# Patient Record
Sex: Female | Born: 1955 | Race: Black or African American | Hispanic: No | Smoking: Former smoker
Health system: Southern US, Community
[De-identification: ages and names within clinical notes are randomized; demographics above are authoritative.]

## PROBLEM LIST (undated history)

## (undated) DIAGNOSIS — M349 Systemic sclerosis, unspecified: Secondary | ICD-10-CM

## (undated) DIAGNOSIS — J479 Bronchiectasis, uncomplicated: Secondary | ICD-10-CM

## (undated) DIAGNOSIS — M79606 Pain in leg, unspecified: Secondary | ICD-10-CM

## (undated) DIAGNOSIS — M87022 Idiopathic aseptic necrosis of left humerus: Secondary | ICD-10-CM

## (undated) DIAGNOSIS — M069 Rheumatoid arthritis, unspecified: Secondary | ICD-10-CM

## (undated) DIAGNOSIS — I219 Acute myocardial infarction, unspecified: Secondary | ICD-10-CM

## (undated) DIAGNOSIS — I2510 Atherosclerotic heart disease of native coronary artery without angina pectoris: Secondary | ICD-10-CM

## (undated) DIAGNOSIS — I10 Essential (primary) hypertension: Secondary | ICD-10-CM

## (undated) DIAGNOSIS — K219 Gastro-esophageal reflux disease without esophagitis: Secondary | ICD-10-CM

## (undated) DIAGNOSIS — IMO0001 Reserved for inherently not codable concepts without codable children: Secondary | ICD-10-CM

## (undated) DIAGNOSIS — E7800 Pure hypercholesterolemia, unspecified: Secondary | ICD-10-CM

## (undated) HISTORY — DX: Pain in leg, unspecified: M79.606

## (undated) HISTORY — PX: APPENDECTOMY: SHX54

---

## 1997-12-27 ENCOUNTER — Encounter: Admission: RE | Admit: 1997-12-27 | Discharge: 1997-12-27 | Payer: Self-pay | Admitting: Family Medicine

## 1998-01-25 ENCOUNTER — Encounter: Admission: RE | Admit: 1998-01-25 | Discharge: 1998-01-25 | Payer: Self-pay | Admitting: Family Medicine

## 1998-04-03 ENCOUNTER — Encounter: Admission: RE | Admit: 1998-04-03 | Discharge: 1998-04-03 | Payer: Self-pay | Admitting: Family Medicine

## 1998-05-06 ENCOUNTER — Encounter: Admission: RE | Admit: 1998-05-06 | Discharge: 1998-05-06 | Payer: Self-pay | Admitting: Family Medicine

## 1998-06-04 ENCOUNTER — Encounter: Admission: RE | Admit: 1998-06-04 | Discharge: 1998-06-04 | Payer: Self-pay | Admitting: Family Medicine

## 1998-09-04 ENCOUNTER — Other Ambulatory Visit: Admission: RE | Admit: 1998-09-04 | Discharge: 1998-09-04 | Payer: Self-pay | Admitting: Family Medicine

## 1998-09-04 ENCOUNTER — Encounter: Admission: RE | Admit: 1998-09-04 | Discharge: 1998-09-04 | Payer: Self-pay | Admitting: Family Medicine

## 1998-11-13 ENCOUNTER — Emergency Department (HOSPITAL_COMMUNITY): Admission: EM | Admit: 1998-11-13 | Discharge: 1998-11-13 | Payer: Self-pay | Admitting: *Deleted

## 1999-01-15 ENCOUNTER — Encounter: Admission: RE | Admit: 1999-01-15 | Discharge: 1999-01-15 | Payer: Self-pay | Admitting: Family Medicine

## 1999-01-21 ENCOUNTER — Ambulatory Visit (HOSPITAL_COMMUNITY): Admission: RE | Admit: 1999-01-21 | Discharge: 1999-01-21 | Payer: Self-pay | Admitting: *Deleted

## 1999-02-13 ENCOUNTER — Encounter: Admission: RE | Admit: 1999-02-13 | Discharge: 1999-02-13 | Payer: Self-pay | Admitting: Family Medicine

## 1999-02-28 ENCOUNTER — Emergency Department (HOSPITAL_COMMUNITY): Admission: EM | Admit: 1999-02-28 | Discharge: 1999-02-28 | Payer: Self-pay | Admitting: Emergency Medicine

## 1999-03-01 ENCOUNTER — Inpatient Hospital Stay (HOSPITAL_COMMUNITY): Admission: EM | Admit: 1999-03-01 | Discharge: 1999-03-06 | Payer: Self-pay | Admitting: Emergency Medicine

## 1999-03-01 ENCOUNTER — Encounter: Payer: Self-pay | Admitting: General Surgery

## 1999-03-01 ENCOUNTER — Encounter (INDEPENDENT_AMBULATORY_CARE_PROVIDER_SITE_OTHER): Payer: Self-pay | Admitting: Specialist

## 1999-03-01 ENCOUNTER — Encounter: Payer: Self-pay | Admitting: Emergency Medicine

## 1999-04-25 ENCOUNTER — Encounter: Admission: RE | Admit: 1999-04-25 | Discharge: 1999-04-25 | Payer: Self-pay | Admitting: Family Medicine

## 1999-05-08 ENCOUNTER — Encounter: Admission: RE | Admit: 1999-05-08 | Discharge: 1999-05-08 | Payer: Self-pay | Admitting: Family Medicine

## 1999-05-29 ENCOUNTER — Encounter: Admission: RE | Admit: 1999-05-29 | Discharge: 1999-05-29 | Payer: Self-pay | Admitting: Family Medicine

## 1999-09-19 ENCOUNTER — Encounter: Admission: RE | Admit: 1999-09-19 | Discharge: 1999-09-19 | Payer: Self-pay | Admitting: Family Medicine

## 1999-12-29 ENCOUNTER — Encounter: Admission: RE | Admit: 1999-12-29 | Discharge: 1999-12-29 | Payer: Self-pay | Admitting: Family Medicine

## 2000-01-02 ENCOUNTER — Ambulatory Visit (HOSPITAL_COMMUNITY): Admission: RE | Admit: 2000-01-02 | Discharge: 2000-01-02 | Payer: Self-pay | Admitting: *Deleted

## 2000-03-23 ENCOUNTER — Encounter: Admission: RE | Admit: 2000-03-23 | Discharge: 2000-03-23 | Payer: Self-pay | Admitting: *Deleted

## 2000-04-26 ENCOUNTER — Encounter: Admission: RE | Admit: 2000-04-26 | Discharge: 2000-04-26 | Payer: Self-pay | Admitting: Family Medicine

## 2000-06-04 ENCOUNTER — Ambulatory Visit (HOSPITAL_COMMUNITY): Admission: RE | Admit: 2000-06-04 | Discharge: 2000-06-04 | Payer: Self-pay | Admitting: Gastroenterology

## 2000-06-04 ENCOUNTER — Encounter: Payer: Self-pay | Admitting: Gastroenterology

## 2000-07-28 ENCOUNTER — Encounter: Admission: RE | Admit: 2000-07-28 | Discharge: 2000-07-28 | Payer: Self-pay | Admitting: Family Medicine

## 2000-07-28 ENCOUNTER — Other Ambulatory Visit: Admission: RE | Admit: 2000-07-28 | Discharge: 2000-07-28 | Payer: Self-pay | Admitting: *Deleted

## 2001-03-02 ENCOUNTER — Encounter: Admission: RE | Admit: 2001-03-02 | Discharge: 2001-03-02 | Payer: Self-pay | Admitting: Family Medicine

## 2001-03-25 ENCOUNTER — Encounter: Admission: RE | Admit: 2001-03-25 | Discharge: 2001-03-25 | Payer: Self-pay | Admitting: Family Medicine

## 2001-05-10 ENCOUNTER — Encounter: Admission: RE | Admit: 2001-05-10 | Discharge: 2001-05-10 | Payer: Self-pay | Admitting: Family Medicine

## 2001-07-28 ENCOUNTER — Encounter: Admission: RE | Admit: 2001-07-28 | Discharge: 2001-07-28 | Payer: Self-pay | Admitting: Family Medicine

## 2001-08-12 ENCOUNTER — Encounter: Admission: RE | Admit: 2001-08-12 | Discharge: 2001-08-12 | Payer: Self-pay | Admitting: Family Medicine

## 2001-09-01 ENCOUNTER — Encounter: Admission: RE | Admit: 2001-09-01 | Discharge: 2001-09-01 | Payer: Self-pay | Admitting: Family Medicine

## 2001-09-01 ENCOUNTER — Other Ambulatory Visit: Admission: RE | Admit: 2001-09-01 | Discharge: 2001-09-01 | Payer: Self-pay | Admitting: Family Medicine

## 2001-09-09 ENCOUNTER — Ambulatory Visit (HOSPITAL_COMMUNITY): Admission: RE | Admit: 2001-09-09 | Discharge: 2001-09-09 | Payer: Self-pay | Admitting: Family Medicine

## 2001-10-18 ENCOUNTER — Encounter: Admission: RE | Admit: 2001-10-18 | Discharge: 2001-10-18 | Payer: Self-pay | Admitting: Family Medicine

## 2001-10-21 ENCOUNTER — Encounter: Admission: RE | Admit: 2001-10-21 | Discharge: 2001-10-21 | Payer: Self-pay | Admitting: Family Medicine

## 2001-11-09 ENCOUNTER — Encounter: Admission: RE | Admit: 2001-11-09 | Discharge: 2001-11-09 | Payer: Self-pay | Admitting: Family Medicine

## 2001-12-02 ENCOUNTER — Encounter: Admission: RE | Admit: 2001-12-02 | Discharge: 2001-12-02 | Payer: Self-pay | Admitting: Family Medicine

## 2002-02-08 ENCOUNTER — Ambulatory Visit (HOSPITAL_COMMUNITY): Admission: RE | Admit: 2002-02-08 | Discharge: 2002-02-08 | Payer: Self-pay | Admitting: Family Medicine

## 2002-02-08 ENCOUNTER — Encounter: Admission: RE | Admit: 2002-02-08 | Discharge: 2002-02-08 | Payer: Self-pay | Admitting: Family Medicine

## 2002-02-24 ENCOUNTER — Encounter: Admission: RE | Admit: 2002-02-24 | Discharge: 2002-02-24 | Payer: Self-pay | Admitting: Family Medicine

## 2002-04-03 ENCOUNTER — Encounter: Admission: RE | Admit: 2002-04-03 | Discharge: 2002-04-03 | Payer: Self-pay | Admitting: Family Medicine

## 2002-04-07 ENCOUNTER — Encounter: Admission: RE | Admit: 2002-04-07 | Discharge: 2002-04-07 | Payer: Self-pay | Admitting: Family Medicine

## 2002-07-31 ENCOUNTER — Encounter: Admission: RE | Admit: 2002-07-31 | Discharge: 2002-07-31 | Payer: Self-pay | Admitting: Family Medicine

## 2002-08-08 ENCOUNTER — Encounter: Admission: RE | Admit: 2002-08-08 | Discharge: 2002-08-08 | Payer: Self-pay | Admitting: Sports Medicine

## 2002-08-08 ENCOUNTER — Ambulatory Visit (HOSPITAL_COMMUNITY): Admission: RE | Admit: 2002-08-08 | Discharge: 2002-08-08 | Payer: Self-pay | Admitting: Family Medicine

## 2002-08-15 ENCOUNTER — Encounter: Admission: RE | Admit: 2002-08-15 | Discharge: 2002-08-15 | Payer: Self-pay | Admitting: Sports Medicine

## 2002-09-18 ENCOUNTER — Encounter: Admission: RE | Admit: 2002-09-18 | Discharge: 2002-09-18 | Payer: Self-pay | Admitting: Pediatrics

## 2002-09-18 ENCOUNTER — Other Ambulatory Visit: Admission: RE | Admit: 2002-09-18 | Discharge: 2002-09-18 | Payer: Self-pay | Admitting: Family Medicine

## 2002-09-26 ENCOUNTER — Encounter: Payer: Self-pay | Admitting: Sports Medicine

## 2002-09-26 ENCOUNTER — Encounter: Admission: RE | Admit: 2002-09-26 | Discharge: 2002-09-26 | Payer: Self-pay | Admitting: Sports Medicine

## 2002-12-12 ENCOUNTER — Ambulatory Visit (HOSPITAL_COMMUNITY): Admission: RE | Admit: 2002-12-12 | Discharge: 2002-12-12 | Payer: Self-pay | Admitting: Family Medicine

## 2003-01-15 ENCOUNTER — Encounter: Admission: RE | Admit: 2003-01-15 | Discharge: 2003-01-15 | Payer: Self-pay | Admitting: Family Medicine

## 2003-05-11 ENCOUNTER — Encounter: Admission: RE | Admit: 2003-05-11 | Discharge: 2003-05-11 | Payer: Self-pay | Admitting: Family Medicine

## 2003-05-11 ENCOUNTER — Encounter: Payer: Self-pay | Admitting: Family Medicine

## 2003-05-11 LAB — CONVERTED CEMR LAB
HDL: 44 mg/dL
Triglycerides: 118 mg/dL

## 2003-11-28 ENCOUNTER — Encounter: Admission: RE | Admit: 2003-11-28 | Discharge: 2003-11-28 | Payer: Self-pay | Admitting: Sports Medicine

## 2003-12-05 ENCOUNTER — Encounter: Admission: RE | Admit: 2003-12-05 | Discharge: 2003-12-05 | Payer: Self-pay | Admitting: Family Medicine

## 2004-06-26 ENCOUNTER — Ambulatory Visit: Payer: Self-pay | Admitting: Family Medicine

## 2004-10-25 ENCOUNTER — Emergency Department (HOSPITAL_COMMUNITY): Admission: EM | Admit: 2004-10-25 | Discharge: 2004-10-25 | Payer: Self-pay | Admitting: Emergency Medicine

## 2005-01-13 ENCOUNTER — Ambulatory Visit: Payer: Self-pay | Admitting: Family Medicine

## 2005-01-13 ENCOUNTER — Encounter: Payer: Self-pay | Admitting: Family Medicine

## 2005-01-13 LAB — CONVERTED CEMR LAB
Cholesterol: 154 mg/dL
LDL Cholesterol: 93 mg/dL
Triglycerides: 87 mg/dL
VLDL: 17 mg/dL

## 2005-02-11 ENCOUNTER — Encounter: Admission: RE | Admit: 2005-02-11 | Discharge: 2005-02-11 | Payer: Self-pay | Admitting: Sports Medicine

## 2006-02-16 ENCOUNTER — Encounter: Admission: RE | Admit: 2006-02-16 | Discharge: 2006-02-16 | Payer: Self-pay | Admitting: Sports Medicine

## 2006-02-24 ENCOUNTER — Encounter (INDEPENDENT_AMBULATORY_CARE_PROVIDER_SITE_OTHER): Payer: Self-pay | Admitting: *Deleted

## 2006-02-24 LAB — CONVERTED CEMR LAB

## 2006-03-08 ENCOUNTER — Ambulatory Visit: Payer: Self-pay | Admitting: Sports Medicine

## 2006-03-08 LAB — CONVERTED CEMR LAB
AST: 24 units/L
BUN: 11 mg/dL
CO2: 27 meq/L
Chloride: 104 meq/L
Glucose, Bld: 98 mg/dL
Potassium: 4 meq/L
TSH: 2.336 microintl units/mL

## 2006-09-23 DIAGNOSIS — M1990 Unspecified osteoarthritis, unspecified site: Secondary | ICD-10-CM

## 2006-09-23 DIAGNOSIS — F29 Unspecified psychosis not due to a substance or known physiological condition: Secondary | ICD-10-CM | POA: Insufficient documentation

## 2006-09-23 DIAGNOSIS — I7300 Raynaud's syndrome without gangrene: Secondary | ICD-10-CM | POA: Insufficient documentation

## 2006-09-23 DIAGNOSIS — I10 Essential (primary) hypertension: Secondary | ICD-10-CM | POA: Insufficient documentation

## 2006-09-23 DIAGNOSIS — F339 Major depressive disorder, recurrent, unspecified: Secondary | ICD-10-CM

## 2006-09-23 DIAGNOSIS — I739 Peripheral vascular disease, unspecified: Secondary | ICD-10-CM

## 2006-09-23 DIAGNOSIS — F17200 Nicotine dependence, unspecified, uncomplicated: Secondary | ICD-10-CM

## 2006-09-23 HISTORY — DX: Raynaud's syndrome without gangrene: I73.00

## 2006-09-24 ENCOUNTER — Encounter (INDEPENDENT_AMBULATORY_CARE_PROVIDER_SITE_OTHER): Payer: Self-pay | Admitting: *Deleted

## 2006-09-29 ENCOUNTER — Ambulatory Visit: Payer: Self-pay | Admitting: Family Medicine

## 2006-10-08 ENCOUNTER — Ambulatory Visit (HOSPITAL_COMMUNITY): Admission: RE | Admit: 2006-10-08 | Discharge: 2006-10-08 | Payer: Self-pay | Admitting: Family Medicine

## 2007-03-15 ENCOUNTER — Emergency Department (HOSPITAL_COMMUNITY): Admission: EM | Admit: 2007-03-15 | Discharge: 2007-03-15 | Payer: Self-pay | Admitting: Emergency Medicine

## 2007-03-17 ENCOUNTER — Telehealth (INDEPENDENT_AMBULATORY_CARE_PROVIDER_SITE_OTHER): Payer: Self-pay | Admitting: *Deleted

## 2007-03-18 ENCOUNTER — Ambulatory Visit: Payer: Self-pay | Admitting: Family Medicine

## 2007-03-18 DIAGNOSIS — L84 Corns and callosities: Secondary | ICD-10-CM | POA: Insufficient documentation

## 2007-03-29 ENCOUNTER — Telehealth: Payer: Self-pay | Admitting: *Deleted

## 2007-03-31 ENCOUNTER — Encounter: Admission: RE | Admit: 2007-03-31 | Discharge: 2007-03-31 | Payer: Self-pay | Admitting: Sports Medicine

## 2007-05-05 ENCOUNTER — Encounter: Payer: Self-pay | Admitting: *Deleted

## 2007-05-08 ENCOUNTER — Encounter: Payer: Self-pay | Admitting: Family Medicine

## 2007-05-16 ENCOUNTER — Encounter: Payer: Self-pay | Admitting: Family Medicine

## 2007-05-19 ENCOUNTER — Ambulatory Visit: Payer: Self-pay | Admitting: Family Medicine

## 2007-06-02 ENCOUNTER — Encounter: Payer: Self-pay | Admitting: Family Medicine

## 2007-06-22 ENCOUNTER — Telehealth: Payer: Self-pay | Admitting: Family Medicine

## 2007-06-28 ENCOUNTER — Ambulatory Visit: Payer: Self-pay | Admitting: Family Medicine

## 2007-07-01 ENCOUNTER — Encounter: Payer: Self-pay | Admitting: Family Medicine

## 2007-07-07 ENCOUNTER — Encounter: Payer: Self-pay | Admitting: Family Medicine

## 2007-07-07 ENCOUNTER — Ambulatory Visit: Payer: Self-pay | Admitting: Family Medicine

## 2007-07-07 LAB — CONVERTED CEMR LAB
BUN: 15 mg/dL (ref 6–23)
Calcium: 8.9 mg/dL (ref 8.4–10.5)
Chloride: 106 meq/L (ref 96–112)
Cholesterol: 163 mg/dL (ref 0–200)
Creatinine, Ser: 0.99 mg/dL (ref 0.40–1.20)
HDL: 43 mg/dL (ref >39)
LDL Cholesterol: 90 mg/dL (ref 0–99)
Potassium: 3.9 meq/L (ref 3.5–5.3)

## 2007-07-17 ENCOUNTER — Encounter: Payer: Self-pay | Admitting: Family Medicine

## 2007-08-02 ENCOUNTER — Encounter: Payer: Self-pay | Admitting: Family Medicine

## 2007-08-02 LAB — CONVERTED CEMR LAB
ALT: 30 units/L
HCT: 41.4 %
Indirect Bilirubin: 0.3 mg/dL
Total Bilirubin: 0.5 mg/dL
Total Protein: 9 g/dL

## 2007-09-05 ENCOUNTER — Encounter: Payer: Self-pay | Admitting: Family Medicine

## 2007-09-19 ENCOUNTER — Telehealth: Payer: Self-pay | Admitting: Family Medicine

## 2007-10-07 ENCOUNTER — Telehealth: Payer: Self-pay | Admitting: *Deleted

## 2007-10-10 ENCOUNTER — Ambulatory Visit: Payer: Self-pay | Admitting: Family Medicine

## 2008-02-20 ENCOUNTER — Telehealth: Payer: Self-pay | Admitting: *Deleted

## 2008-03-16 ENCOUNTER — Encounter: Payer: Self-pay | Admitting: *Deleted

## 2008-04-06 ENCOUNTER — Telehealth: Payer: Self-pay | Admitting: *Deleted

## 2008-04-10 ENCOUNTER — Telehealth: Payer: Self-pay | Admitting: *Deleted

## 2008-05-02 ENCOUNTER — Encounter: Admission: RE | Admit: 2008-05-02 | Discharge: 2008-05-02 | Payer: Self-pay | Admitting: Family Medicine

## 2008-05-06 ENCOUNTER — Encounter (INDEPENDENT_AMBULATORY_CARE_PROVIDER_SITE_OTHER): Payer: Self-pay | Admitting: *Deleted

## 2008-07-16 ENCOUNTER — Telehealth: Payer: Self-pay | Admitting: Family Medicine

## 2008-08-02 ENCOUNTER — Ambulatory Visit: Payer: Self-pay | Admitting: Family Medicine

## 2008-08-02 ENCOUNTER — Encounter (INDEPENDENT_AMBULATORY_CARE_PROVIDER_SITE_OTHER): Payer: Self-pay | Admitting: *Deleted

## 2008-08-02 DIAGNOSIS — K219 Gastro-esophageal reflux disease without esophagitis: Secondary | ICD-10-CM

## 2008-08-07 ENCOUNTER — Encounter: Payer: Self-pay | Admitting: Family Medicine

## 2009-01-10 ENCOUNTER — Encounter (INDEPENDENT_AMBULATORY_CARE_PROVIDER_SITE_OTHER): Payer: Self-pay | Admitting: *Deleted

## 2009-01-29 ENCOUNTER — Ambulatory Visit: Payer: Self-pay | Admitting: Family Medicine

## 2009-01-29 ENCOUNTER — Encounter: Payer: Self-pay | Admitting: Family Medicine

## 2009-05-22 ENCOUNTER — Telehealth: Payer: Self-pay | Admitting: *Deleted

## 2009-05-22 ENCOUNTER — Encounter: Admission: RE | Admit: 2009-05-22 | Discharge: 2009-05-22 | Payer: Self-pay | Admitting: Family Medicine

## 2009-05-23 ENCOUNTER — Telehealth: Payer: Self-pay | Admitting: Family Medicine

## 2009-08-28 ENCOUNTER — Other Ambulatory Visit: Admission: RE | Admit: 2009-08-28 | Discharge: 2009-08-28 | Payer: Self-pay | Admitting: Family Medicine

## 2009-08-28 ENCOUNTER — Ambulatory Visit: Payer: Self-pay | Admitting: Family Medicine

## 2009-09-02 LAB — CONVERTED CEMR LAB: Pap Smear: NEGATIVE

## 2009-10-31 ENCOUNTER — Ambulatory Visit: Payer: Self-pay | Admitting: Family Medicine

## 2009-10-31 ENCOUNTER — Telehealth: Payer: Self-pay | Admitting: *Deleted

## 2009-11-06 ENCOUNTER — Encounter: Admission: RE | Admit: 2009-11-06 | Discharge: 2009-11-06 | Payer: Self-pay | Admitting: Family Medicine

## 2010-08-26 NOTE — Assessment & Plan Note (Signed)
Summary: allergies/eo   Vital Signs:  Patient profile:   55 year old female Height:      66.25 inches Weight:      149.5 pounds Temp:     98.5 degrees F oral Pulse rate:   98 / minute BP sitting:   147 / 95  (right arm) Cuff size:   regular  Vitals Entered By: San Morelle, SMA CC: Pt c/o cough for 2 months. She also stated she has a runny nose, sneezing, stomach hurts from coughing to much, gagging, loss bowels, vomitting, and trouble sleeping.    Primary Care Provider:  Eustaquio Boyden  MD  CC:  Pt c/o cough for 2 months. She also stated she has a runny nose, sneezing, stomach hurts from coughing to much, gagging, loss bowels, vomitting, and and trouble sleeping. Marland Kitchen  History of Present Illness: CC: continued cough  almost 3 mo h/o cough, scratchy throat with cough, feels like throat stretching.  Having gagging with cough.  only lots of flegm coming up (yellow), no emesis.  No fevers/chills.  Takes black cohosh for hot flashes.  + rhinorrhea and itchy eyes.  + sinus draining/congestion.  + post nasal drip.  Cough worse after eating - happens with all foods.  Does have h/o GERD, only on ranitidine.  + about 10 lb weight loss in last 6 mo (160 to 149), not trying.  No dysphagia with solids or liquids.  h/o scleroderma.  Also having loose bowels and trouble sleeping from coughing so much.  Currently on Lisinopril as well as other meds and compliant.  Habits & Providers  Alcohol-Tobacco-Diet     Tobacco Status: current     Tobacco Counseling: to quit use of tobacco products     Cigarette Packs/Day: 0.5  Current Medications (verified): 1)  Lisinopril 10 Mg Tabs (Lisinopril) .... Take One By Mouth Daily 2)  Atrovent Hfa 17 Mcg/act Aers (Ipratropium Bromide Hfa) .... Inhale 2 Puff Using Inhaler Four Times A Day 3)  Hydrochlorothiazide 25 Mg Tabs (Hydrochlorothiazide) .... Take 1 Tablet By Mouth Once A Day 4)  Verapamil Hcl 80 Mg Tabs (Verapamil Hcl) .... Take 1 Tablet By Mouth  Three Times A Day 5)  Zyprexa 5 Mg Tabs (Olanzapine) .... Take 1 Tablet By Mouth Once A Day 6)  Acid Control Maximum Strength 150 Mg  Tabs (Ranitidine Hcl) .... Take One By Mouth Two Times A Day 7)  Caltrate 600 1500 Mg Tabs (Calcium Carbonate) .... One By Mouth Bid 8)  Black Cohosh 200 Mg Caps (Black Cohosh) .... One Two Times A Day 9)  Lac-Hydrin 12 % Crea (Ammonium Lactate) .... Apply To Affected Area Two Times A Day 10)  Hydromet 5-1.5 Mg/50ml Syrp (Hydrocodone-Homatropine) .... 5cc Q 6 Hour As Needed Cough 11)  Flagyl 500 Mg Tabs (Metronidazole) .... Take Four Tablets X 1  Allergies (verified): 1)  ! Asa  Past History:  Past medical, surgical, family and social histories (including risk factors) reviewed for relevance to current acute and chronic problems.  Past Medical History: Reviewed history from 09/23/2006 and no changes required. 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 Ewing Schlein 11/01 -, PFTs (1/04)-mild obst dz, normal diff.  Cap - 08/15/2002, PFTs (2/03)- mild obst dz, poss rest dz - 09/22/2001, PFTs (6/00) Mild rest. Dz, mod dec. diff. -, PFTs (6/01) no sig change - 01/20/2000  Family History: Reviewed history and no changes required.  Social History: Reviewed history from 06/28/2007 and no changes required. Lives in trailer with 5 yr old daughter, grandson, and ex-boyfriend of 21 yrs.  Disabled, not working.  Smokes 1/2 ppd.  Occasional alcohol.  Has significant agoraphobia, and doesn't leave her house much.  Physical Exam  General:  Well-developed,well-nourished,in no acute distress;  alert,appropriate and cooperative throughout examination Mouth:  Oral mucosa and oropharynx without lesions or exudates.  Neck:  No deformities, masses, or tenderness noted.  no LAD Lungs:  Normal respiratory effort, chest expands symmetrically. Lungs are clear to auscultation, no crackles or wheezes. Heart:  Normal rate and regular rhythm. S1 and S2 normal without gallop, murmur, click, rub or other extra sounds.   Impression & Recommendations:  Problem # 1:  COUGH (ICD-786.2) 3 mo h/o cough in smoker with 10 lb weight loss in 6 mo without trying.  check CXR for intrapulmonary pathology.  Discussed most common reasons for cough including allergic, GERD, ACEI induced.  Will treat all 3.  See pt instructions.  RTC 2 wks for f/u.  Orders: CXR- 2view (CXR) [facility]- Est  Level 4 ([address])  Problem # 2:  TOBACCO DEPENDENCE (ICD-305.1) set up with pharmacy clinic for discussion on cessation.  Pt states down to 5-6 cig/day.  interestd in quitting. Orders: CXR- 2view (CXR)  Problem # 3:  HYPERTENSION, BENIGN SYSTEMIC (ICD-401.1) change lisinopril to losartan to see if cough improves. Her updated medication list for this problem includes:    Losartan Potassium 50 Mg Tabs (Losartan potassium) ..... One by mouth daily for blood pressure    Hydrochlorothiazide 25 Mg Tabs (Hydrochlorothiazide) .Marland Kitchen... Take 1 tablet by mouth once a day    Verapamil Hcl 80 Mg Tabs (Verapamil hcl) .Marland Kitchen... Take 1 tablet by mouth three times a day  BP today: 147/95 Prior BP: 160/89 (08/28/2009)  Labs Reviewed: K+: 3.9 (07/07/2007) Creat: : 0.99 (07/07/2007)   Chol: 163 (07/07/2007)   HDL: 43 (07/07/2007)   LDL: 90 (07/07/2007)   TG: 151 (07/07/2007)  Problem # 4:  GASTROESOPHAGEAL REFLUX DISEASE (ICD-530.81)  Her updated medication list for this problem includes:    Acid Control Maximum Strength 150 Mg Tabs (Ranitidine hcl) .Marland Kitchen... Take one by mouth two times a day    Omeprazole 40 Mg Cpdr (Omeprazole) ..... One by mouth  daily for reflux  Discussed lifestyle modifications, diet, antacids/medications, and preventive measures.   Complete Medication List: 1)  Losartan Potassium 50 Mg Tabs (Losartan potassium) .... One by mouth daily for blood pressure 2)  Atrovent Hfa 17 Mcg/act Aers (Ipratropium bromide hfa) .... Inhale 2 puff using inhaler four times a day 3)  Hydrochlorothiazide 25 Mg Tabs (Hydrochlorothiazide) .... Take 1 tablet by mouth once a day 4)  Verapamil Hcl 80 Mg Tabs (Verapamil hcl) .... Take 1 tablet by mouth three times a day 5)  Zyprexa 5 Mg Tabs (Olanzapine) .... Take 1 tablet by mouth once a day 6)  Acid Control Maximum Strength 150 Mg Tabs (Ranitidine hcl) .... Take one by mouth two times a day 7)  Caltrate 600 1500 Mg Tabs (Calcium carbonate) .... One by mouth bid 8)  Black Cohosh 200 Mg Caps (Black cohosh) .... One two times a day 9)  Lac-hydrin 12 % Crea (Ammonium lactate) .... Apply to affected  area two times a day 10)  Hydromet 5-1.5 Mg/39ml Syrp (Hydrocodone-homatropine) .... 5cc q 6 hour as needed cough 11)  Flagyl 500 Mg Tabs (Metronidazole) .... Take four tablets x 1 12)  Flonase 50 Mcg/act Susp (Fluticasone propionate) .... One squirt into each nostril daily 13)  Cetirizine Hcl 10 Mg Tabs (Cetirizine hcl) .... One po daily for allergies 14)  Omeprazole 40 Mg Cpdr (Omeprazole) .... One by mouth daily for reflux  Patient Instructions: 1)  Set up appointment with pharmacy clinic to discuss smoking cessation at your earliest convenience. 2)  Return in 2 weeks. 3)  Keep cutting back on smoking. 4)  omeprazole instead of ranitidine for reflux, take one daily. 5)  No acidic/citrus foods, no eating 2 hours before bedtime, try to sleep on incline 6)  Change from lisinopril to cozaar for blood pressure. 7)  Flonase nasal steroid and zyrtec daily for allergies. 8)  xray in next few days. Prescriptions: OMEPRAZOLE 40 MG CPDR (OMEPRAZOLE) one by mouth daily for reflux  #30 x 3   Entered  and Authorized by:   Eustaquio Boyden  MD   Signed by:   Eustaquio Boyden  MD on 10/31/2009   Method used:   Electronically to        [facility] [phone]* (retail)       [address]       [place], [place]  [address]       Ph: [phone]       Fax: [phone]   RxID:   [id number] CETIRIZINE HCL 10 MG TABS (CETIRIZINE HCL) one po daily for allergies  #30 x 1   Entered and Authorized by:   Eustaquio Boyden  MD   Signed by:   Eustaquio Boyden  MD on 10/31/2009   Method used:   Electronically to        [facility] [phone]* (retail)       [address]       [place], [place]  [address]       Ph: [phone]       Fax: [phone]   RxID:   [id number] FLONASE 50 MCG/ACT SUSP (FLUTICASONE PROPIONATE) one squirt into each nostril daily  #1 x 1   Entered and Authorized by:   Eustaquio Boyden  MD   Signed by:   Eustaquio Boyden  MD on 10/31/2009   Method used:   Electronically to        [facility] [phone]* (retail)       [address]       [place], [place]  [address]       Ph: [phone]       Fax: [phone]   RxID:   [id number] RDEYCXKG POTASSIUM 50 MG TABS (LOSARTAN POTASSIUM) one by mouth daily for blood pressure  #30 x 3   Entered and Authorized by:   Eustaquio Boyden  MD   Signed by:   Eustaquio Boyden  MD on 10/31/2009   Method used:   Electronically to        [facility] [phone]* (retail)       [address]       [place], [place]  [address]       Ph: [phone]       Fax: [phone]   RxID:   [id number]

## 2010-08-26 NOTE — Assessment & Plan Note (Signed)
Summary: cpe,tcb   Vital Signs:  Patient profile:   55 year old female Height:      66.25 inches Weight:      152.19 pounds BMI:     24.47 Temp:     99.0 degrees F oral Pulse rate:   105 / minute BP sitting:   160 / 89  (left arm)  Vitals Entered By: Eustaquio Boyden  MD (August 28, 2009 2:29 PM) CC: CPE/PAP Is Patient Diabetic? No Pain Assessment Patient in pain? yes     Location: feet Intensity: 10 Type: aching   Primary Care Provider:  Eustaquio Boyden  MD  CC:  CPE/PAP.  History of Present Illness: CC: CPE, cough  1. cough - 3+ wks of dry cough.  Started with rhinorrhea and mucous.  Denies ST, ear pain, congestion, f/c.  Tried something OTC that she forgets name of, also throat spray with mild relief.  Does feel throat scratchy causing her to want to cough.  No abd pain.  2. CPE - last pap 2007.  UTD on mammo.  always had normal paps.  feels safe at home.  Habits & Providers  Alcohol-Tobacco-Diet     Tobacco Status: current     Cigarette Packs/Day: 0.5  Current Medications (verified): 1)  Lisinopril 10 Mg Tabs (Lisinopril) .... Take One By Mouth Daily 2)  Atrovent Hfa 17 Mcg/act Aers (Ipratropium Bromide Hfa) .... Inhale 2 Puff Using Inhaler Four Times A Day 3)  Hydrochlorothiazide 25 Mg Tabs (Hydrochlorothiazide) .... Take 1 Tablet By Mouth Once A Day 4)  Verapamil Hcl 80 Mg Tabs (Verapamil Hcl) .... Take 1 Tablet By Mouth Three Times A Day 5)  Zyprexa 5 Mg Tabs (Olanzapine) .... Take 1 Tablet By Mouth Once A Day 6)  Acid Control Maximum Strength 150 Mg  Tabs (Ranitidine Hcl) .... Take One By Mouth Two Times A Day 7)  Caltrate 600 1500 Mg Tabs (Calcium Carbonate) .... One By Mouth Bid 8)  Black Cohosh 200 Mg Caps (Black Cohosh) .... One Two Times A Day 9)  Lac-Hydrin 12 % Crea (Ammonium Lactate) .... Apply To Affected Area Two Times A Day 10)  Hydromet 5-1.5 Mg/61ml Syrp (Hydrocodone-Homatropine) .... 5cc Q 6 Hour As Needed Cough  Allergies (verified): 1)   ! Asa  Past History:  Past medical, surgical, family and social histories (including risk factors) reviewed for relevance to current acute and chronic problems.  Past Medical History: Reviewed history from 09/23/2006 and no changes required. 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 Ewing Schlein 11/01 -, PFTs (1/04)-mild obst dz, normal diff. Cap - 08/15/2002, PFTs (2/03)- mild obst dz, poss rest dz - 09/22/2001, PFTs (6/00) Mild rest. Dz, mod dec. diff. -, PFTs (6/01) no sig change - 01/20/2000  Family History: Reviewed history and no changes required.  Social History: Reviewed history from 06/28/2007 and no changes required. Lives in trailer with 37 yr old daughter, grandson, and ex-boyfriend of 21 yrs.  Disabled, not working.  Smokes 1/2 ppd.  Occasional alcohol.  Has significant agoraphobia, and doesn't leave her house much.Packs/Day:  0.5  Physical Exam  General:  Well-developed,well-nourished,in no acute distress; alert,appropriate and cooperative throughout examination Head:  Normocephalic and atraumatic without obvious abnormalities. No apparent alopecia or balding. Eyes:  No corneal or conjunctival inflammation noted. EOMI. Perrla. Ears:  External ear exam shows no significant lesions or deformities.  Otoscopic examination reveals clear canals, tympanic membranes are intact bilaterally without bulging, retraction, inflammation or discharge. Hearing is grossly normal bilaterally. Nose:  External nasal examination shows no deformity or inflammation. Nasal mucosa are pink and moist without lesions or  exudates. Mouth:  Oral mucosa and oropharynx without lesions or exudates.  Neck:  No deformities, masses, or tenderness noted. Lungs:  Normal respiratory effort, chest expands symmetrically. Lungs are clear to auscultation, no crackles or wheezes. Heart:  Normal rate and regular rhythm. S1 and S2 normal without gallop, murmur, click, rub or other extra sounds. Genitalia:  Pelvic Exam:        External: normal female genitalia without lesions or masses        Vagina: normal without lesions or masses        Cervix: normal without lesions or masses        Adnexa: normal bimanual exam without masses or fullness        Uterus: normal by palpation        Pap smear: performed Skin:  Intact without suspicious lesions or rashes   Impression & Recommendations:  Problem # 1:  SCREENING FOR MALIGNANT NEOPLASM OF THE CERVIX (ICD-V76.2) pap performed.  if normal, rpt in 3 years Orders: Pap Smear-[facility] ([id number]) [facility] - Est  40-64 yrs ([address])  Problem # 2:  HYPERTENSION, BENIGN SYSTEMIC (ICD-401.1)  advised to continue checking, RTC in a few weeks for recheck, may need to increase verapamil or add another agent. Her updated medication list for this problem includes:    Lisinopril 10 Mg Tabs (Lisinopril) .Marland Kitchen... Take one by mouth daily    Hydrochlorothiazide 25 Mg Tabs (Hydrochlorothiazide) .Marland Kitchen... Take 1 tablet by mouth once a day    Verapamil Hcl 80 Mg Tabs (Verapamil hcl) .Marland Kitchen... Take 1 tablet by mouth three times a day  Orders: [facility] - Est  40-64 yrs ([id number])  BP today: 160/89 Prior BP: 136/91 (01/29/2009)  Labs Reviewed: K+: 3.9 (07/07/2007) Creat: : 0.99 (07/07/2007)   Chol: 163 (07/07/2007)   HDL: 43 (07/07/2007)   LDL: 90 (07/07/2007)   TG: 151 (07/07/2007)  Problem # 3:  COUGH (ICD-786.2)  likely residual of viral URTI.  no concerning sxs of bacterial infection.  Will treat with cough suppressant, RTC if not improved in 2 wks.  Pt is on ACEI.  flu shot today (pt has not received, asks if  could be flu).  possibly, but now in watch and wait period of improvmenet.  needs pneumovax.  Orders: [facility] - Est  40-64 yrs ([id number])  Problem # 4:  PREVENTIVE HEALTH CARE (ICD-V70.0) well woman exam today.  utd on mammo.  flu today. Orders: [facility] - Est  40-64 yrs ([address])  Complete Medication List: 1)  Lisinopril 10 Mg Tabs (Lisinopril) .... Take one by mouth daily 2)  Atrovent Hfa 17 Mcg/act Aers (Ipratropium bromide hfa) .... Inhale 2 puff using inhaler four times a day 3)  Hydrochlorothiazide 25 Mg Tabs (Hydrochlorothiazide) .... Take 1 tablet by mouth once a day 4)  Verapamil Hcl 80 Mg Tabs (Verapamil hcl) .... Take 1 tablet by mouth three times a day 5)  Zyprexa 5 Mg Tabs (Olanzapine) .... Take 1 tablet by mouth once a day 6)  Acid Control Maximum Strength 150 Mg Tabs (Ranitidine hcl) .... Take one by  mouth two times a day 7)  Caltrate 600 1500 Mg Tabs (Calcium carbonate) .... One by mouth bid 8)  Black Cohosh 200 Mg Caps (Black cohosh) .... One two times a day 9)  Lac-hydrin 12 % Crea (Ammonium lactate) .... Apply to affected area two times a day 10)  Hydromet 5-1.5 Mg/34ml Syrp (Hydrocodone-homatropine) .... 5cc q 6 hour as needed cough  Other Orders: Influenza Vaccine NON MCR ([id number])  Patient Instructions: 1)  Please return in 3-4 wks for blood pressure follow up.  2)  For your cough, try the cough suppressant syrup. 3)  You received flu shot today. 4)  Your pap looked normal, we will wait for results to come in today. 5)  Call clinic with questions.  Pleasure to see you today. Prescriptions: ZYPREXA 5 MG TABS (OLANZAPINE) Take 1 tablet by mouth once a day  #30 x 5   Entered and Authorized by:   Eustaquio Boyden  MD   Signed by:   Eustaquio Boyden  MD on 08/28/2009   Method used:   Print then Give to Patient   RxID:   [id number] VERAPAMIL HCL 80 MG TABS (VERAPAMIL HCL) Take 1 tablet by mouth three times a day  #90 x 3   Entered and Authorized by:   Eustaquio Boyden  MD    Signed by:   Eustaquio Boyden  MD on 08/28/2009   Method used:   Print then Give to Patient   RxID:   [id number] HYDROCHLOROTHIAZIDE 25 MG TABS (HYDROCHLOROTHIAZIDE) Take 1 tablet by mouth once a day  #30 x 3   Entered and Authorized by:   Eustaquio Boyden  MD   Signed by:   Eustaquio Boyden  MD on 08/28/2009   Method used:   Print then Give to Patient   RxID:   [id number] LISINOPRIL 10 MG TABS (LISINOPRIL) take one by mouth daily  #30 x 3   Entered and Authorized by:   Eustaquio Boyden  MD   Signed by:   Eustaquio Boyden  MD on 08/28/2009   Method used:   Print then Give to Patient   RxID:   [id number] HYDROMET 5-1.5 MG/5ML SYRP (HYDROCODONE-HOMATROPINE) 5cc Q 6 hour as needed cough  #50cc x 0   Entered and Authorized by:   Eustaquio Boyden  MD   Signed by:   Eustaquio Boyden  MD on 08/28/2009   Method used:   Print then Give to Patient   RxID:   [id number]    Prevention & Chronic Care Immunizations   Influenza vaccine: Fluvax Non-MCR  (08/28/2009)   Influenza vaccine due: 08/02/2009    Tetanus booster: 02/24/2006: Done.   Tetanus booster due: 02/2016    Pneumococcal vaccine: Done.  (03/27/1998)   Pneumococcal vaccine due: 03/2008  Colorectal Screening   Hemoccult: Not documented   Hemoccult due: Not Indicated    Colonoscopy: normal  (09/05/2007)   Colonoscopy due: 08/2017  Other Screening   Pap smear: Done.  (02/24/2006)   Pap smear due: 02/2009    Mammogram: ASSESSMENT: Negative - BI-RADS 1^MM DIGITAL SCREENING  (05/22/2009)   Mammogram due: 05/22/2010   Smoking status: current  (08/28/2009)   Smoking cessation counseling: yes  (10/10/2007)  Lipids   Total Cholesterol: 163  (07/07/2007)   LDL: 90  (07/07/2007)   LDL Direct: Not documented   HDL: 43  (07/07/2007)   Triglycerides: 151  (07/07/2007)  Hypertension   Last Blood Pressure: 160 / 89  (08/28/2009)   Serum creatinine: 0.99  (  07/07/2007)   Serum potassium 3.9   (07/07/2007)    Hypertension flowsheet reviewed?: Yes   Progress toward BP goal: Deteriorated  Self-Management Support :   Personal Goals (by the next clinic visit) :      Personal blood pressure goal: 140/90  (08/28/2009)   Hypertension self-management support: BP self-monitoring log, Written self-care plan, Education handout  (08/28/2009)   Hypertension self-care plan printed.   Hypertension education handout printed    Influenza Vaccine    Vaccine Type: Fluvax Non-MCR    Site: right deltoid    Mfr: GlaxoSmithKline    Dose: 0.5 ml    Route: IM    Given by: Terese Door    Exp. Date: 06/302/011    Lot #: [id number]    VIS given: 03/05/2009  Flu Vaccine Consent Questions    Do you have a history of severe allergic reactions to this vaccine? no    Any prior history of allergic reactions to egg and/or gelatin? no    Do you have a sensitivity to the preservative Thimersol? no    Do you have a past history of Guillan-Barre Syndrome? no    Do you currently have an acute febrile illness? no    Have you ever had a severe reaction to latex? no    Vaccine information given and explained to patient? yes    Are you currently pregnant? no

## 2010-08-26 NOTE — Progress Notes (Signed)
Summary: phn msg  Phone Note Call from Patient Call back at Home Phone [phone]   Caller: Patient Summary of Call: Pt could not get x-ray today will try and go tomorrow or Monday. Initial call taken by: Clydell Hakim,  October 31, 2009 4:13 PM

## 2010-10-01 ENCOUNTER — Other Ambulatory Visit: Payer: Self-pay | Admitting: *Deleted

## 2010-10-01 DIAGNOSIS — F329 Major depressive disorder, single episode, unspecified: Secondary | ICD-10-CM

## 2010-10-01 MED ORDER — OLANZAPINE 5 MG PO TABS: 5.0000 mg | ORAL_TABLET | Freq: Every day | ORAL | Status: DC

## 2010-10-28 ENCOUNTER — Ambulatory Visit (INDEPENDENT_AMBULATORY_CARE_PROVIDER_SITE_OTHER): Payer: Medicaid Other | Admitting: Family Medicine

## 2010-10-28 ENCOUNTER — Other Ambulatory Visit (HOSPITAL_COMMUNITY)
Admission: RE | Admit: 2010-10-28 | Discharge: 2010-10-28 | Disposition: A | Discharge status: Home / Self Care | Payer: Medicaid Other | Source: Ambulatory Visit | Attending: Family Medicine | Admitting: Family Medicine

## 2010-10-28 ENCOUNTER — Encounter: Payer: Self-pay | Admitting: Family Medicine

## 2010-10-28 VITALS — BP 180/102 | HR 100 | Temp 97.9°F | Ht 66.0 in | Wt 144.8 lb

## 2010-10-28 DIAGNOSIS — R059 Cough, unspecified: Secondary | ICD-10-CM

## 2010-10-28 DIAGNOSIS — R05 Cough: Secondary | ICD-10-CM

## 2010-10-28 DIAGNOSIS — I10 Essential (primary) hypertension: Secondary | ICD-10-CM

## 2010-10-28 DIAGNOSIS — Z01419 Encounter for gynecological examination (general) (routine) without abnormal findings: Secondary | ICD-10-CM | POA: Insufficient documentation

## 2010-10-28 DIAGNOSIS — L84 Corns and callosities: Secondary | ICD-10-CM

## 2010-10-28 DIAGNOSIS — Z124 Encounter for screening for malignant neoplasm of cervix: Secondary | ICD-10-CM

## 2010-10-28 LAB — CBC
HCT: 45.9 % (ref 36.0–46.0)
MCV: 97.5 fL (ref 78.0–100.0)
RBC: 4.71 MIL/uL (ref 3.87–5.11)
WBC: 6.3 10*3/uL (ref 4.0–10.5)

## 2010-10-28 LAB — COMPREHENSIVE METABOLIC PANEL
ALT: 65 U/L — ABNORMAL HIGH (ref 0–35)
Alkaline Phosphatase: 127 U/L — ABNORMAL HIGH (ref 39–117)
CO2: 28 mEq/L (ref 19–32)
Calcium: 9.6 mg/dL (ref 8.4–10.5)
Glucose, Bld: 114 mg/dL — ABNORMAL HIGH (ref 70–99)
Total Bilirubin: 0.5 mg/dL (ref 0.3–1.2)
Total Protein: 8.7 g/dL — ABNORMAL HIGH (ref 6.0–8.3)

## 2010-10-28 NOTE — Patient Instructions (Signed)
It was nice seeing you today.  For your feet you can try inserts for your shoes to see if this helps or you can buy the medicated corn/callus remover pads that help soften the hard skin.  I will send refills for your prescriptions to [facility] and I will get you back on your losartan for blood pressure. I would like for you to come back in two weeks to have your blood pressure re-checked.  If you have questions please feel free to call our office at [phone].

## 2010-10-29 ENCOUNTER — Telehealth: Payer: Self-pay | Admitting: Family Medicine

## 2010-10-29 ENCOUNTER — Encounter: Payer: Self-pay | Admitting: Family Medicine

## 2010-10-29 MED ORDER — LOSARTAN POTASSIUM 50 MG PO TABS: 50.0000 mg | ORAL_TABLET | Freq: Every day | ORAL | Status: DC

## 2010-10-29 MED ORDER — FLUTICASONE PROPIONATE 50 MCG/ACT NA SUSP: NASAL | Status: DC

## 2010-10-29 MED ORDER — IPRATROPIUM BROMIDE HFA 17 MCG/ACT IN AERS: 2.0000 | INHALATION_SPRAY | Freq: Four times a day (QID) | RESPIRATORY_TRACT | Status: DC

## 2010-10-29 NOTE — Assessment & Plan Note (Signed)
Will get back on flonase, as i think this may be due to AR.  IF not improving may need further evaluation. Counseled to stop smoking.

## 2010-10-29 NOTE — Progress Notes (Deleted)
  Subjective:    Patient ID: Jocelyn Shelton, female    DOB: [birth year], 55 y.o.   MRN: [id number]  HPI    Review of Systems     Objective:   Physical Exam        Assessment & Plan:

## 2010-10-29 NOTE — Telephone Encounter (Signed)
Ms. Sinnett called to say she need her Cozaar,Flonase,Hydrocodone and her inhaler called to [facility] on [address]

## 2010-10-29 NOTE — Assessment & Plan Note (Signed)
Given recommendations, please see AVS for details.

## 2010-10-29 NOTE — Assessment & Plan Note (Signed)
BP elevated will re-start on cozaar, f/u in two weeks for re-check of bp

## 2010-10-29 NOTE — Telephone Encounter (Signed)
Rx for cozaar, flonase and atrovent sent to [facility].  Will not refill hydrocodone syrup as this is not a medication to be used long term.  Currently on atrovent but no record of PFT's done and no diagnosis of COPD.  Will have her come in to have PFT's with Dr. Raymondo Band

## 2010-10-29 NOTE — Progress Notes (Signed)
  Subjective:     Jocelyn Shelton is a 55 y.o. female and is here for a comprehensive physical exam. The patient reports problems - cough x2 months and pain in toes.  Cough has been persistent over the past two months.  Was using flonase and was helping but ran out.  Feels like she is choking at times.  Denies sputum production, hemoptysis.   Pain in feet present for quite a while, only hurts on areas where she has corns/calluses.  Has tried ointments with little help.  History   Social History  . Marital Status: Single    Spouse Name: N/A    Number of Children: N/A  . Years of Education: N/A   Occupational History  . Not on file.   Social History Main Topics  . Smoking status: Current Everyday Smoker -- 0.3 packs/day for 40 years    Types: Cigarettes  . Smokeless tobacco: Not on file  . Alcohol Use: 2.0 oz/week    4 drink(s) per week  . Drug Use: No  . Sexually Active: Not on file   Other Topics Concern  . Not on file   Social History Narrative  . No narrative on file   Health Maintenance  Topic Date Due  . Pap Smear  01/01/1973  . Colonoscopy  01/01/2005  . Influenza Vaccine  04/27/2011  . Mammogram  05/23/2011  . Tetanus/tdap  02/25/2016    The following portions of the patient's history were reviewed and updated as appropriate: allergies, current medications, past family history, past medical history, past social history, past surgical history and problem list.  Review of Systems A comprehensive review of systems was negative, other than what is listed in HPI  Objective:    BP 180/102  Pulse 100  Temp(Src) 97.9 F (36.6 C) (Oral)  Ht 5\' 6"  (1.676 m)  Wt 144 lb 12.8 oz (65.681 kg)  BMI 23.37 kg/m2 General appearance: alert, cooperative and no distress Head: Normocephalic, without obvious abnormality, atraumatic Eyes: conjunctivae/corneas clear. PERRL, EOM's intact. Fundi benign. Ears: normal TM's and external ear canals both ears Nose: Nares normal. Septum  midline. Mucosa normal. No drainage or sinus tenderness. Throat: lips, mucosa, and tongue normal; teeth and gums normal Neck: no adenopathy, no carotid bruit, no JVD, supple, symmetrical, trachea midline and thyroid not enlarged, symmetric, no tenderness/mass/nodules Back: symmetric, no curvature. ROM normal. No CVA tenderness. Lungs: clear to auscultation bilaterally Heart: regular rate and rhythm, S1, S2 normal, no murmur, click, rub or gallop Abdomen: soft, non-tender; bowel sounds normal; no masses,  no organomegaly Pelvic: cervix normal in appearance, external genitalia normal, no adnexal masses or tenderness, no cervical motion tenderness, uterus normal size, shape, and consistency and vagina normal without discharge Extremities: extremities normal, atraumatic, no cyanosis or edema Pulses: 2+ and symmetric Skin: Skin color, texture, turgor normal. No rashes or lesions Lymph nodes: Cervical, supraclavicular, and axillary nodes normal.    Assessment:    1.HTN  2. Corns/Calluses   3. Cough 4. Otherwise normal female exam   Plan:    Pap done Refill medications Discussed smoking cessation Has not been taking cozaar, will restart Will restart flonase for cough, if continuing to persist may need further evaluation.   See After Visit Summary for Counseling Recommendations for corns and calluses

## 2010-10-29 NOTE — Progress Notes (Deleted)
  Subjective:    Patient ID: Jocelyn Shelton, female    DOB: 04/16/1956, 54 y.o.   MRN: [id number]  HPI    Review of Systems     Objective:   Physical Exam        Assessment & Plan:   

## 2010-10-30 NOTE — Telephone Encounter (Addendum)
Attempted to call patient, no answer or voice mail. If patient returns call please have her schedule appointment with Paulino Rily for PFT's. Also Rx for cozaar, flonase and atrovent sent to pharmacy. Will not refill hydrocodone, see message below.Jocelyn Shelton, Rodena Medin

## 2010-11-14 ENCOUNTER — Other Ambulatory Visit: Payer: Self-pay | Admitting: Family Medicine

## 2010-11-14 NOTE — Telephone Encounter (Signed)
refill request

## 2010-11-17 ENCOUNTER — Encounter: Payer: Self-pay | Admitting: Family Medicine

## 2010-11-24 ENCOUNTER — Telehealth: Payer: Self-pay | Admitting: Family Medicine

## 2010-11-24 MED ORDER — OLANZAPINE 5 MG PO TABS: 5.0000 mg | ORAL_TABLET | Freq: Every day | ORAL | Status: DC

## 2010-11-24 NOTE — Telephone Encounter (Signed)
Refilled via fax request. 

## 2010-11-24 NOTE — Telephone Encounter (Signed)
Forward to Lynn

## 2010-11-24 NOTE — Telephone Encounter (Signed)
Told her it was sent 4/20. To call back if pharmacy did not get it & I can send it again

## 2010-11-24 NOTE — Telephone Encounter (Signed)
Pt has been out of her Zyprexa and pharm says she needs PA [facility] [address]

## 2010-11-24 NOTE — Telephone Encounter (Addendum)
Refill request faxed from pharmacy . Not sure that patient needs PA . Spoke with patient and she thinks she just needs a refill . Request given to Dr. Leveda Anna

## 2010-11-24 NOTE — Telephone Encounter (Signed)
Pt has been trying to get refill on her Olanzapine for a month.  She have spoken with the pharmacy on several occasions regarding this.  Please send refill to [facility] on [address]  Call pt if there are any questions.

## 2010-12-25 ENCOUNTER — Other Ambulatory Visit: Payer: Self-pay | Admitting: Family Medicine

## 2010-12-25 DIAGNOSIS — I10 Essential (primary) hypertension: Secondary | ICD-10-CM

## 2010-12-25 NOTE — Telephone Encounter (Signed)
Refill request

## 2010-12-26 ENCOUNTER — Encounter: Payer: Self-pay | Admitting: *Deleted

## 2010-12-26 NOTE — Telephone Encounter (Signed)
This encounter was created in error - please disregard.

## 2011-02-13 ENCOUNTER — Other Ambulatory Visit: Payer: Self-pay | Admitting: Family Medicine

## 2011-02-13 NOTE — Telephone Encounter (Signed)
Refill request

## 2011-03-01 ENCOUNTER — Emergency Department (HOSPITAL_COMMUNITY)
Admission: EM | Admit: 2011-03-01 | Discharge: 2011-03-01 | Disposition: A | Discharge status: Home / Self Care | Payer: Medicaid Other | Attending: Emergency Medicine | Admitting: Emergency Medicine

## 2011-03-01 DIAGNOSIS — K219 Gastro-esophageal reflux disease without esophagitis: Secondary | ICD-10-CM | POA: Insufficient documentation

## 2011-03-01 DIAGNOSIS — L299 Pruritus, unspecified: Secondary | ICD-10-CM | POA: Insufficient documentation

## 2011-03-01 DIAGNOSIS — I10 Essential (primary) hypertension: Secondary | ICD-10-CM | POA: Insufficient documentation

## 2011-03-01 DIAGNOSIS — L509 Urticaria, unspecified: Secondary | ICD-10-CM | POA: Insufficient documentation

## 2011-03-10 ENCOUNTER — Ambulatory Visit (INDEPENDENT_AMBULATORY_CARE_PROVIDER_SITE_OTHER): Payer: Medicaid Other | Admitting: Family Medicine

## 2011-03-10 ENCOUNTER — Encounter: Payer: Self-pay | Admitting: Family Medicine

## 2011-03-10 VITALS — BP 134/90 | HR 110 | Temp 99.5°F | Wt 146.0 lb

## 2011-03-10 DIAGNOSIS — L259 Unspecified contact dermatitis, unspecified cause: Secondary | ICD-10-CM | POA: Insufficient documentation

## 2011-03-10 MED ORDER — PREDNISONE 20 MG PO TABS: 20.0000 mg | ORAL_TABLET | Freq: Every day | ORAL | Status: AC

## 2011-03-10 MED ORDER — HYDROCORTISONE 2.5 % EX CREA: TOPICAL_CREAM | CUTANEOUS | Status: AC

## 2011-03-10 NOTE — Assessment & Plan Note (Signed)
Rebound after 5 days of systemic treatment for contact derm, add 7 days of prednisone 20 mg.  May use topical hydrocortisone on arms

## 2011-03-10 NOTE — Progress Notes (Signed)
  Subjective:    Patient ID: Jocelyn Shelton, female    DOB: 20-Jul-1956, 55 y.o.   MRN: [id number]  HPI Contact dermatitis one week ago after pulling tomatoes in a bunch of weeds.  Went to ER and put on prednisone for only 5 days, face is starting to swell again, but areas on arms are drying up.  Review of Systems  Constitutional: Negative for fever and chills.  Skin: Positive for rash.       Objective:   Physical Exam  Constitutional: She appears well-developed.  Skin:       Red, raised patchy skin rash on arms and left side of face.  Lesion on arms are dry, lesion on face around mastoid process is getting swollen, has been off prednisone for 3 days after only 5 days of treatment.          Assessment & Plan:

## 2011-03-10 NOTE — Patient Instructions (Signed)
Take one more week of prednisone 20 mg for your poison ivy on your face May use the cream on your arms twice daily If you come in contact with weeds always take a shower immediately and wash clothes in cold water

## 2011-03-31 ENCOUNTER — Other Ambulatory Visit: Payer: Self-pay | Admitting: Family Medicine

## 2011-04-01 NOTE — Telephone Encounter (Signed)
Refill request

## 2011-04-08 ENCOUNTER — Ambulatory Visit: Payer: Medicaid Other | Admitting: Family Medicine

## 2011-04-10 ENCOUNTER — Ambulatory Visit (INDEPENDENT_AMBULATORY_CARE_PROVIDER_SITE_OTHER): Payer: Medicaid Other | Admitting: Family Medicine

## 2011-04-10 ENCOUNTER — Encounter: Payer: Self-pay | Admitting: Family Medicine

## 2011-04-10 VITALS — BP 151/96 | HR 108 | Temp 98.9°F | Ht 66.0 in | Wt 145.3 lb

## 2011-04-10 DIAGNOSIS — L52 Erythema nodosum: Secondary | ICD-10-CM

## 2011-04-10 DIAGNOSIS — Z23 Encounter for immunization: Secondary | ICD-10-CM

## 2011-04-10 DIAGNOSIS — R05 Cough: Secondary | ICD-10-CM

## 2011-04-10 LAB — POCT RAPID STREP A (OFFICE): Rapid Strep A Screen: NEGATIVE

## 2011-04-10 MED ORDER — NAPROXEN 500 MG PO TABS: 500.0000 mg | ORAL_TABLET | Freq: Three times a day (TID) | ORAL | Status: DC

## 2011-04-10 NOTE — Progress Notes (Signed)
  Subjective:    Patient ID: Jocelyn Shelton, female    DOB: 1955/11/15, 55 y.o.   MRN: [id number]  HPI 1. Rash on arms/face/trunk and legs Patient was seen august 14th by susan saxon for presumed contact dermatitis. Was treated with steroids. Went ER subsequently and was treated with unknown AB and steroids. Patient returns today with continue rash on face/arms/legs/trunk. The rash is hot, painful erythematous nodules that are macular. She has associated cough (but smokes) and recent URI/sore throat. She was started on Losartan in April but no other medications.  No hx of sob or chest pain. No insect bites No fever. No arthritis No abdominal pain or diarrhea.  Review of Systems  Constitutional: Negative for fever and chills.  HENT: Positive for sore throat and facial swelling. Negative for congestion.   Respiratory: Positive for cough. Negative for shortness of breath.   Cardiovascular: Positive for leg swelling. Negative for chest pain.  Gastrointestinal: Negative for nausea, vomiting, abdominal pain, diarrhea and constipation.  Genitourinary: Negative for dysuria and flank pain.  Musculoskeletal: Negative for myalgias, back pain, joint swelling and arthralgias.  Skin: Positive for rash.  Neurological: Negative for dizziness, seizures, syncope and headaches.  Hematological: Negative for adenopathy. Does not bruise/bleed easily.       Objective:   Physical Exam  Nursing note and vitals reviewed. Constitutional: She appears well-developed and well-nourished. No distress.  HENT:  Mouth/Throat: Oropharynx is clear and moist. No oropharyngeal exudate.       Erythematous facial rash and swelling  Eyes: Conjunctivae and EOM are normal. Pupils are equal, round, and reactive to light.  Neck: Neck supple.  Cardiovascular: Normal rate, regular rhythm and normal heart sounds.   No murmur heard. Pulmonary/Chest: Effort normal and breath sounds normal. No respiratory distress. She has no  wheezes. She has no rales. She exhibits no tenderness.  Abdominal: Soft. There is no tenderness.  Lymphadenopathy:    She has no cervical adenopathy.  Skin: Skin is warm. Rash noted. Rash is macular and nodular. She is not diaphoretic. There is erythema.          Tender erythematous nodules      Assessment & Plan:  1. Erythema Nodosum - r/u strep/TB/Sarcoid - losartan stoppped - ESR/CBC/TB test/Strep Swab/Chest Xray - Naproxen 500 mg TID and rest - Dermatology referral

## 2011-04-10 NOTE — Patient Instructions (Signed)
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  Take the naproxen with food as prescribed until your red rash goes away.

## 2011-04-11 LAB — CBC WITH DIFFERENTIAL/PLATELET
Basophils Relative: 0 % (ref 0–1)
Eosinophils Absolute: 0 10*3/uL (ref 0.0–0.7)
Hemoglobin: 13.8 g/dL (ref 12.0–15.0)
MCH: 32 pg (ref 26.0–34.0)
MCHC: 33.2 g/dL (ref 30.0–36.0)
Monocytes Relative: 3 % (ref 3–12)
Neutrophils Relative %: 90 % — ABNORMAL HIGH (ref 43–77)
Platelets: 281 10*3/uL (ref 150–400)
RDW: 13.4 % (ref 11.5–15.5)

## 2011-04-11 LAB — SEDIMENTATION RATE: Sed Rate: 74 mm/hr — ABNORMAL HIGH (ref 0–22)

## 2011-04-13 ENCOUNTER — Telehealth: Payer: Self-pay | Admitting: Family Medicine

## 2011-04-13 ENCOUNTER — Ambulatory Visit (INDEPENDENT_AMBULATORY_CARE_PROVIDER_SITE_OTHER): Payer: Medicaid Other | Admitting: *Deleted

## 2011-04-13 ENCOUNTER — Ambulatory Visit
Admission: RE | Admit: 2011-04-13 | Discharge: 2011-04-13 | Disposition: A | Discharge status: Home / Self Care | Payer: Medicaid Other | Source: Ambulatory Visit | Attending: Family Medicine | Admitting: Family Medicine

## 2011-04-13 DIAGNOSIS — R05 Cough: Secondary | ICD-10-CM

## 2011-04-13 DIAGNOSIS — R918 Other nonspecific abnormal finding of lung field: Secondary | ICD-10-CM

## 2011-04-13 DIAGNOSIS — IMO0001 Reserved for inherently not codable concepts without codable children: Secondary | ICD-10-CM

## 2011-04-13 DIAGNOSIS — R059 Cough, unspecified: Secondary | ICD-10-CM

## 2011-04-13 DIAGNOSIS — L52 Erythema nodosum: Secondary | ICD-10-CM

## 2011-04-13 DIAGNOSIS — Z111 Encounter for screening for respiratory tuberculosis: Secondary | ICD-10-CM

## 2011-04-13 NOTE — Telephone Encounter (Signed)
Discussed chest xray findings with patient. Her rash is improving now. Will get CT chest to confirm findings.

## 2011-04-27 ENCOUNTER — Telehealth: Payer: Self-pay | Admitting: *Deleted

## 2011-04-27 ENCOUNTER — Encounter: Payer: Self-pay | Admitting: Family Medicine

## 2011-04-27 ENCOUNTER — Other Ambulatory Visit: Payer: Self-pay | Admitting: Family Medicine

## 2011-04-27 ENCOUNTER — Inpatient Hospital Stay (HOSPITAL_COMMUNITY): Admission: RE | Admit: 2011-04-27 | Payer: Medicaid Other | Source: Ambulatory Visit

## 2011-04-27 ENCOUNTER — Ambulatory Visit
Admission: RE | Admit: 2011-04-27 | Discharge: 2011-04-27 | Disposition: A | Discharge status: Home / Self Care | Payer: Medicaid Other | Source: Ambulatory Visit | Attending: Family Medicine | Admitting: Family Medicine

## 2011-04-27 ENCOUNTER — Ambulatory Visit (INDEPENDENT_AMBULATORY_CARE_PROVIDER_SITE_OTHER): Payer: Medicaid Other | Admitting: Family Medicine

## 2011-04-27 ENCOUNTER — Other Ambulatory Visit: Payer: Medicaid Other

## 2011-04-27 DIAGNOSIS — R222 Localized swelling, mass and lump, trunk: Secondary | ICD-10-CM

## 2011-04-27 DIAGNOSIS — R918 Other nonspecific abnormal finding of lung field: Secondary | ICD-10-CM

## 2011-04-27 DIAGNOSIS — R21 Rash and other nonspecific skin eruption: Secondary | ICD-10-CM

## 2011-04-27 DIAGNOSIS — L52 Erythema nodosum: Secondary | ICD-10-CM

## 2011-04-27 LAB — BASIC METABOLIC PANEL WITH GFR
BUN: 10 mg/dL (ref 6–23)
CO2: 27 meq/L (ref 19–32)
Calcium: 8.6 mg/dL (ref 8.4–10.5)
Chloride: 99 meq/L (ref 96–112)
Creat: 0.75 mg/dL (ref 0.50–1.10)
Glucose, Bld: 97 mg/dL (ref 70–99)
Potassium: 3.8 meq/L (ref 3.5–5.3)
Sodium: 136 meq/L (ref 135–145)

## 2011-04-27 MED ORDER — IOHEXOL 300 MG/ML  SOLN
75.0000 mL | Freq: Once | INTRAMUSCULAR | Status: AC | PRN
  Administered 2011-04-27: 75 mL via INTRAVENOUS

## 2011-04-27 NOTE — Patient Instructions (Signed)
I will call you with your ct scan results, dermatology referral and punch biopsy results.

## 2011-04-27 NOTE — Telephone Encounter (Signed)
Pt has an appt on 10.03.2012 (wednesday) @ 1045 am with Dr. Elise[facility] [address]. Pt informed, understood, and agreed.Loralee Pacas [place]

## 2011-04-27 NOTE — Progress Notes (Deleted)
  Subjective:    Patient ID: Jocelyn Shelton, female    DOB: [birth year], 55 y.o.   MRN: [id number]  HPI    Review of Systems     Objective:   Physical Exam        Assessment & Plan:

## 2011-04-27 NOTE — Progress Notes (Addendum)
appt to have CT of chest with contrast made for TODAY at [facility] [address] at 400 pm. Pt instructed to arrive at 345 pm, and not to have anything to eat after 12 pm today. She can have something to drink but NO FOOD. Pt informed and understood.Heath Gold  Authorization # for this procedure is [id number].Laureen Ochs, Viann Shove  Also [facility] was called Dr. Rivka Safer spoke with someone in scheduling so that we could get an earlier appt for pt. I was instructed to fax over pt's demo, OV notes and labs to the triage nurse at [phone]. Informed pt that as soon as they made a discission that either their office would contact her or we would.Loralee Pacas [place]

## 2011-04-27 NOTE — Assessment & Plan Note (Signed)
F/u for worsening rash Has mass seen on cxray. Ct scan of chest with contrast BMP Referral to Dermatology Punch bx

## 2011-04-27 NOTE — Assessment & Plan Note (Signed)
Seen on chest xray Left lower lobe lung mass Ct scan of chest

## 2011-04-27 NOTE — Progress Notes (Signed)
  Subjective:    Patient ID: Jocelyn Shelton, female    DOB: [birth year], 55 y.o.   MRN: [id number]  HPI 1. Erythema Nodosum/ new papular rash  Patient returns today for evaluation of her rash. She had said it was improving on the phone, but it has started to worsen and now there is a papular component on her arms. She has not seen dermatology yet or had her CT of the chest for lung mass vs. Consolidation seen on chest xray. Her nodosum areas are still tender. She has no new upper respiratory symptoms. Her TB test was negative. Patient does have a history of scleroderma.  2. Lung mass CT chest pending. No hemoptysis, no fever, no sob.  Review of Systems  Constitutional: Negative for fever and chills.  HENT: Positive for sore throat and facial swelling. Negative for congestion.   Respiratory: Positive for cough. Negative for shortness of breath.   Cardiovascular: Positive for leg swelling. Negative for chest pain.  Gastrointestinal: Negative for nausea, vomiting, abdominal pain, diarrhea and constipation.  Genitourinary: Negative for dysuria and flank pain.  Musculoskeletal: Negative for myalgias, back pain, joint swelling and arthralgias.  Skin: Positive for rash.  Neurological: Negative for dizziness, seizures, syncope and headaches.  Hematological: Negative for adenopathy. Does not bruise/bleed easily.       Objective:   Physical Exam  Nursing note and vitals reviewed. Constitutional: She appears well-developed and well-nourished. No distress.  HENT:  Mouth/Throat: Oropharynx is clear and moist. No oropharyngeal exudate.       Erythematous facial rash and swelling  Eyes: Conjunctivae and EOM are normal. Pupils are equal, round, and reactive to light.  Neck: Neck supple.  Cardiovascular: Normal rate, regular rhythm and normal heart sounds.   No murmur heard. Pulmonary/Chest: Effort normal and breath sounds normal. No respiratory distress. She has no wheezes. She has no rales. She  exhibits no tenderness.  Abdominal: Soft. There is no tenderness.  Lymphadenopathy:    She has no cervical adenopathy.  Skin: Skin is warm. Rash noted. Rash is macular and nodular. She is not diaphoretic. There is erythema.  Papular areas noted on both arms. Grey discrete groupings.        Tender erythematous nodules      Assessment & Plan:

## 2011-04-28 ENCOUNTER — Other Ambulatory Visit: Payer: Self-pay | Admitting: Family Medicine

## 2011-04-28 LAB — RPR

## 2011-04-28 MED ORDER — HYDROCHLOROTHIAZIDE 25 MG PO TABS: 25.0000 mg | ORAL_TABLET | Freq: Every day | ORAL | Status: DC

## 2011-04-28 MED ORDER — OLANZAPINE 5 MG PO TABS: 5.0000 mg | ORAL_TABLET | Freq: Every day | ORAL | Status: DC

## 2011-04-29 ENCOUNTER — Telehealth: Payer: Self-pay | Admitting: Family Medicine

## 2011-04-29 ENCOUNTER — Other Ambulatory Visit: Payer: Self-pay | Admitting: Family Medicine

## 2011-04-29 MED ORDER — HYDROXYZINE HCL 10 MG PO TABS: 10.0000 mg | ORAL_TABLET | Freq: Three times a day (TID) | ORAL | Status: DC | PRN

## 2011-04-29 NOTE — Telephone Encounter (Signed)
Discussed CT scan results with patient. Discussed lab results. Patient saw dermatology, per patient, they are waiting for the path results from the skin biopsy.

## 2011-04-29 NOTE — Telephone Encounter (Signed)
Message copied by Edd Arbour on Wed Apr 29, 2011  3:10 PM ------      Message from: Tivis Ringer      Created: Tue Apr 28, 2011 11:01 AM       Looks like good news on the CT      ----- Message -----         From: Rad Results In Interface         Sent: 04/27/2011   4:43 PM           To: Richrd Prime Hensel

## 2011-05-12 ENCOUNTER — Other Ambulatory Visit: Payer: Self-pay | Admitting: Family Medicine

## 2011-05-12 MED ORDER — OLANZAPINE 5 MG PO TABS: 5.0000 mg | ORAL_TABLET | Freq: Every day | ORAL | Status: DC

## 2011-05-13 ENCOUNTER — Other Ambulatory Visit: Payer: Self-pay | Admitting: Dermatology

## 2011-06-26 ENCOUNTER — Other Ambulatory Visit: Payer: Self-pay | Admitting: Family Medicine

## 2011-06-26 MED ORDER — VERAPAMIL HCL 80 MG PO TABS: 80.0000 mg | ORAL_TABLET | Freq: Three times a day (TID) | ORAL | Status: DC

## 2011-06-29 ENCOUNTER — Ambulatory Visit: Payer: Medicaid Other | Admitting: Family Medicine

## 2011-06-29 ENCOUNTER — Encounter: Payer: Self-pay | Admitting: Family Medicine

## 2011-06-29 ENCOUNTER — Ambulatory Visit (INDEPENDENT_AMBULATORY_CARE_PROVIDER_SITE_OTHER): Payer: Medicaid Other | Admitting: Family Medicine

## 2011-06-29 VITALS — BP 129/78 | HR 117 | Temp 98.4°F | Ht 66.0 in | Wt 132.6 lb

## 2011-06-29 DIAGNOSIS — L851 Acquired keratosis [keratoderma] palmaris et plantaris: Secondary | ICD-10-CM

## 2011-06-29 DIAGNOSIS — L870 Keratosis follicularis et parafollicularis in cutem penetrans: Secondary | ICD-10-CM

## 2011-06-29 DIAGNOSIS — L52 Erythema nodosum: Secondary | ICD-10-CM

## 2011-06-29 MED ORDER — KETOROLAC TROMETHAMINE 60 MG/2ML IJ SOLN: 30.0000 mg | Freq: Once | INTRAMUSCULAR | Status: AC

## 2011-06-29 MED ORDER — MELOXICAM 7.5 MG PO TABS: 7.5000 mg | ORAL_TABLET | Freq: Every day | ORAL | Status: DC | PRN

## 2011-07-07 DIAGNOSIS — L870 Keratosis follicularis et parafollicularis in cutem penetrans: Secondary | ICD-10-CM | POA: Insufficient documentation

## 2011-07-07 NOTE — Progress Notes (Signed)
  Subjective:    Patient ID: Jocelyn Shelton, female    DOB: 15-Nov-1955, 55 y.o.   MRN: [id number]  HPI 1. Pain all over:  Comes in today with complaint of painful areas all over her body.  Also with some pain in her joints.  Has not noticed any swelling of her joints.  Symptoms have been waxing and waning for a while but this morning noticed that it was worse and more painful. 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  She has seen a rheumatologist but this was >10 years ago, but is not currently followed.   Review of Systems     Objective:   Physical Exam  Constitutional: She appears well-developed and well-nourished. No distress.  Cardiovascular: Normal rate and regular rhythm.   Pulmonary/Chest: Effort normal and breath sounds normal.  Musculoskeletal:       Joints non tender to touch, ROM is full in all extremities  Skin:       Large maculopapular areas on lower legs, arms and palm erythematous and tender to touch.  No warmth, does not appear cellulitic          Assessment & Plan:

## 2011-07-07 NOTE — Assessment & Plan Note (Addendum)
Has had fairly extensive w/u in the past.  History of scleroderma.  ?Whether she has some other rheumatological disease contributing.  Possible lung mass on cxr but CT ok.  Will refer to rheumatology for further w/u.  For now will treat acute flare with NSAID

## 2011-10-12 ENCOUNTER — Other Ambulatory Visit: Payer: Self-pay | Admitting: Family Medicine

## 2011-10-12 MED ORDER — OLANZAPINE 5 MG PO TABS: 5.0000 mg | ORAL_TABLET | Freq: Every day | ORAL | Status: DC

## 2011-10-14 ENCOUNTER — Other Ambulatory Visit: Payer: Self-pay | Admitting: Family Medicine

## 2011-10-14 MED ORDER — HYDROCHLOROTHIAZIDE 25 MG PO TABS: 25.0000 mg | ORAL_TABLET | Freq: Every day | ORAL | Status: DC

## 2011-10-30 ENCOUNTER — Other Ambulatory Visit: Payer: Self-pay | Admitting: Family Medicine

## 2011-10-30 MED ORDER — VERAPAMIL HCL 80 MG PO TABS: 80.0000 mg | ORAL_TABLET | Freq: Three times a day (TID) | ORAL | Status: DC

## 2011-11-23 ENCOUNTER — Ambulatory Visit (INDEPENDENT_AMBULATORY_CARE_PROVIDER_SITE_OTHER): Payer: Medicaid Other | Admitting: Family Medicine

## 2011-11-23 ENCOUNTER — Encounter: Payer: Self-pay | Admitting: Family Medicine

## 2011-11-23 VITALS — BP 159/93 | HR 105 | Ht 66.0 in | Wt 151.0 lb

## 2011-11-23 DIAGNOSIS — R05 Cough: Secondary | ICD-10-CM

## 2011-11-23 MED ORDER — CETIRIZINE HCL 10 MG PO TABS: 10.0000 mg | ORAL_TABLET | Freq: Every day | ORAL | Status: DC

## 2011-11-23 NOTE — Patient Instructions (Signed)
Thank you for coming in today, it was good to see you Start using your flonase again as well as the zyrtec I have sent in for you Also use your albuterol inhaler for the next couple of days to help with the little bit of wheezing you are having I will send over another referral for the dermatologist.

## 2011-11-24 DIAGNOSIS — R05 Cough: Secondary | ICD-10-CM | POA: Insufficient documentation

## 2011-11-24 DIAGNOSIS — R059 Cough, unspecified: Secondary | ICD-10-CM | POA: Insufficient documentation

## 2011-11-24 NOTE — Progress Notes (Signed)
  Subjective:    Patient ID: Jocelyn Shelton, female    DOB: June 26, 1956, 56 y.o.   MRN: [id number]  HPI  1. Cough: Has had cough for approximately one month.  Also with some nasal congestion.  States this all started when the pollen came out.  Cough is non productive.  She denies any shortness of breath, wheezing, nausea or vomiting.  Review of Systems     Objective:   Physical Exam  Constitutional: She is oriented to person, place, and time. She appears well-nourished. No distress.  HENT:       Mild posterior oropharyngeal erythema, no exudate   Neck: Neck supple.  Cardiovascular: Normal rate and regular rhythm.   Pulmonary/Chest: Effort normal.       Mild diffuse expiratory wheezes.   Lymphadenopathy:    She has no cervical adenopathy.  Neurological: She is alert and oriented to person, place, and time.          Assessment & Plan:

## 2011-11-24 NOTE — Assessment & Plan Note (Signed)
Likely due to allergic rhinitis, has used flonase and zyrtec before. Will give refills for this.  Mild wheezing on exam, advised to stop smoking and use albuterol for the next couple of days.

## 2011-12-15 ENCOUNTER — Other Ambulatory Visit: Payer: Self-pay | Admitting: Family Medicine

## 2011-12-15 MED ORDER — OMEPRAZOLE 40 MG PO CPDR: 40.0000 mg | DELAYED_RELEASE_CAPSULE | Freq: Every day | ORAL | Status: DC

## 2011-12-15 MED ORDER — FLUTICASONE PROPIONATE 50 MCG/ACT NA SUSP: NASAL | Status: DC

## 2011-12-15 MED ORDER — IPRATROPIUM BROMIDE HFA 17 MCG/ACT IN AERS: 2.0000 | INHALATION_SPRAY | Freq: Four times a day (QID) | RESPIRATORY_TRACT | Status: DC

## 2012-01-06 ENCOUNTER — Telehealth: Payer: Self-pay | Admitting: Family Medicine

## 2012-01-06 NOTE — Telephone Encounter (Signed)
Talked with patient scratchy throat x3 days.  Sounds hoarse over the phone. Doubt her atrovent inhaler would be causing this.  Explained likely from virus or post nasal drip/allergies.  She is using her zyrtec and flonase intermittently.  Advised to use these  Regularly over the next few days to see if symptoms improve.  Can also try warm salt water gargles.  Advised that she can make appt. To be seen at our clinic if not improving by first of next week.

## 2012-01-06 NOTE — Telephone Encounter (Signed)
Patient is calling because of some side effects from her inhaler, or that is what she thinks it might be from.  She is very raspy and has a bit of a sore throat and wants to talk to Dr. Ashley Royalty about this.

## 2012-02-01 ENCOUNTER — Encounter: Payer: Self-pay | Admitting: Family Medicine

## 2012-02-01 ENCOUNTER — Ambulatory Visit (INDEPENDENT_AMBULATORY_CARE_PROVIDER_SITE_OTHER): Payer: Medicaid Other | Admitting: Family Medicine

## 2012-02-01 VITALS — BP 180/100 | HR 110 | Temp 98.4°F | Ht 66.0 in | Wt 163.0 lb

## 2012-02-01 DIAGNOSIS — R21 Rash and other nonspecific skin eruption: Secondary | ICD-10-CM

## 2012-02-01 DIAGNOSIS — I10 Essential (primary) hypertension: Secondary | ICD-10-CM

## 2012-02-01 NOTE — Progress Notes (Signed)
  Subjective:    Patient ID: Jocelyn Shelton, female    DOB: [birth year], 56 y.o.   MRN: [id number]  HPI  Rash on face - bilateral, started 2 weeks ago, as small bumps on bridge of nose and then spread to more generalized on face, feels "itchy and sore", has seen a dermatologist at [facility] and diagnosed with Kyrle's disease for which she was prescribed Fluocinonide and Tretinoin cream, she used the creams for approximately 2-3 months until she ran out; she stopped using the creams b/c she felts that her lesions on her skin were not any better, has not been back to dermatologist office been prescribed; denies any fever, loss of vision  Review of Systems Positive for cough, cold sweats at night, weight gain for prednisone Negative fever, nausea, vomiting, oss of vision, abnormal bruising or bleeding     Objective:   Physical Exam BP 180/100  Pulse 110  Temp 98.4 F (36.9 C) (Oral)  Ht 5\' 6"  (1.676 m)  Wt 163 lb (73.936 kg)  BMI 26.31 kg/m2 Gen: middle aged AAF, alert and oriented, mild distress Face: bilateral erythematous rash with non-pustular nodule in a malar distribution  Mouth: dry mucous membranes, no lesions, ecchymosis or bleeding  Eyes: hyperemia of sclera bilaterally, EOMI, PERRLA, fundoscopic exam without hemorrhage or blurred disc margins Extremities: numerous hyperpigmented non-tender epidermal nodules      Assessment & Plan:  56 year old female with a facial rash that may represent that exacerbation of Kyrle's Disease vs sequelae of rheumatologic issue vs drug reaction.

## 2012-02-02 DIAGNOSIS — R21 Rash and other nonspecific skin eruption: Secondary | ICD-10-CM | POA: Insufficient documentation

## 2012-02-02 NOTE — Assessment & Plan Note (Signed)
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 Another possibility is a drug reaction to methotrexate. However, she has been on MTX for several months, so I'm less concerned about this. She needs to be seen by [facility] as a follow-up for the Kyrle's disease as the first step. I will make a referral today.

## 2012-02-02 NOTE — Assessment & Plan Note (Signed)
BP Readings from Last 3 Encounters:  02/01/12 180/100  11/23/11 159/93  06/29/11 129/78    Patient with very high BP today, but has not taken HCTZ or verapamil for today. It looks like Dr. Ashley Royalty wanted to restart Cozaar in April, but the patient is not currently taken that. This should be reconciled at the next PCP visit. Consider 24 BP monitoring for accurate measurement.

## 2012-02-05 ENCOUNTER — Telehealth: Payer: Self-pay | Admitting: Family Medicine

## 2012-02-05 DIAGNOSIS — R21 Rash and other nonspecific skin eruption: Secondary | ICD-10-CM

## 2012-02-05 NOTE — Assessment & Plan Note (Signed)
According to Dr. Venancio Poisson with [facility], she does not feel that this rash is related to the Kyrle's disease. She believes that it is a drug reaction with possible culprits beings methotrexate, NSAIDS, and HCTZ. Knowing that Dr. Kellie Simmering recently increase the methotrexate dose for Jocelyn Shelton, makes me most suspicious of this being the cause. Therefore, we will stop the Methotrexate for four weeks. Additionally, Dr. Nicholas Lose has prescribed a 12 days steroid taper to see if the patient's inflammatory rash improves. Jocelyn Shelton will follow-up in our office in two weeks. If the rash does not start to improve, then we will consider stopping other medications individually so as to know the offending agents.

## 2012-02-05 NOTE — Telephone Encounter (Signed)
After speaking with Dr. Nicholas Lose of [facility], we decided that the patient should stop medicatons that may be causing the problem. Therefore, I called [name] and instructed her to stop taking methotrexate for at least 4 weeks. She was also instructed to follow-up in [facility] in 2 weeks. She was in agreement with this plan. I will notify her PCP, Dr. Ashley Royalty and Dr. Kellie Simmering of the plan.

## 2012-03-01 ENCOUNTER — Other Ambulatory Visit: Payer: Self-pay | Admitting: *Deleted

## 2012-03-01 MED ORDER — OLANZAPINE 5 MG PO TABS: 5.0000 mg | ORAL_TABLET | Freq: Every day | ORAL | Status: DC

## 2012-03-08 ENCOUNTER — Emergency Department (HOSPITAL_COMMUNITY)
Admission: EM | Admit: 2012-03-08 | Discharge: 2012-03-08 | Disposition: A | Discharge status: Home / Self Care | Payer: Medicaid Other | Attending: Emergency Medicine | Admitting: Emergency Medicine

## 2012-03-08 ENCOUNTER — Encounter (HOSPITAL_COMMUNITY): Payer: Self-pay | Admitting: *Deleted

## 2012-03-08 DIAGNOSIS — I10 Essential (primary) hypertension: Secondary | ICD-10-CM | POA: Insufficient documentation

## 2012-03-08 DIAGNOSIS — L259 Unspecified contact dermatitis, unspecified cause: Secondary | ICD-10-CM | POA: Insufficient documentation

## 2012-03-08 DIAGNOSIS — F17200 Nicotine dependence, unspecified, uncomplicated: Secondary | ICD-10-CM | POA: Insufficient documentation

## 2012-03-08 DIAGNOSIS — K219 Gastro-esophageal reflux disease without esophagitis: Secondary | ICD-10-CM | POA: Insufficient documentation

## 2012-03-08 DIAGNOSIS — L309 Dermatitis, unspecified: Secondary | ICD-10-CM

## 2012-03-08 DIAGNOSIS — M069 Rheumatoid arthritis, unspecified: Secondary | ICD-10-CM | POA: Insufficient documentation

## 2012-03-08 HISTORY — DX: Gastro-esophageal reflux disease without esophagitis: K21.9

## 2012-03-08 HISTORY — DX: Reserved for inherently not codable concepts without codable children: IMO0001

## 2012-03-08 HISTORY — DX: Systemic sclerosis, unspecified: M34.9

## 2012-03-08 HISTORY — DX: Essential (primary) hypertension: I10

## 2012-03-08 LAB — CBC WITH DIFFERENTIAL/PLATELET
Eosinophils Absolute: 0.2 10*3/uL (ref 0.0–0.7)
Eosinophils Relative: 3 % (ref 0–5)
HCT: 41 % (ref 36.0–46.0)
Lymphocytes Relative: 21 % (ref 12–46)
Lymphs Abs: 1.2 10*3/uL (ref 0.7–4.0)
MCH: 32 pg (ref 26.0–34.0)
MCV: 97.2 fL (ref 78.0–100.0)
Monocytes Absolute: 0.8 10*3/uL (ref 0.1–1.0)
Platelets: 179 10*3/uL (ref 150–400)
RBC: 4.22 MIL/uL (ref 3.87–5.11)
RDW: 15 % (ref 11.5–15.5)

## 2012-03-08 LAB — POCT I-STAT, CHEM 8
BUN: 4 mg/dL — ABNORMAL LOW (ref 6–23)
Calcium, Ion: 1.07 mmol/L — ABNORMAL LOW (ref 1.12–1.23)
Creatinine, Ser: 1 mg/dL (ref 0.50–1.10)
Hemoglobin: 14.6 g/dL (ref 12.0–15.0)
Sodium: 140 mEq/L (ref 135–145)
TCO2: 30 mmol/L (ref 0–100)

## 2012-03-08 MED ORDER — PREDNISONE 10 MG PO TABS: 40.0000 mg | ORAL_TABLET | Freq: Every day | ORAL | Status: DC

## 2012-03-08 NOTE — ED Notes (Signed)
Pt states last week broke out with burning and itching on skin and has red eyes.  No problems and states that she has been seen for this before and was stated that this is an allergy.  No airway problems

## 2012-03-08 NOTE — ED Provider Notes (Signed)
History    This chart was scribed for Rolan Bucco, MD, MD by Smitty Pluck. The patient was seen in room TR02C and the patient's care was started at 1:47PM.   CSN: [id number]  Arrival date & time 03/08/12  0954   First MD Initiated Contact with Patient 03/08/12 1339      No chief complaint on file.   (Consider location/radiation/quality/duration/timing/severity/associated sxs/prior treatment) The history is provided by the patient.   Jocelyn Shelton is a 56 y.o. female who presents to the Emergency Department complaining of moderate facial rash and red eyes onset 1 week ago. Pt reports the rash is radiating to chest. She reports itching and burning. Reports changing laundry detergent. Pt reports hx of similar symptoms 1 month ago and she used desonide cream for treatment. Reports that she has used desonide cream without relief. Pt was seen at dermatologist last time.  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   Past Medical History  Diagnosis Date  . Hypertension   . Reflux   . Scleroderma   . Rheumatoid arthritis     Past Surgical History  Procedure Date  . Appendectomy 90's    Family History  Problem Relation Age of Onset  . Hypertension Father   . Heart attack Mother   . Diabetes Daughter   . Diabetes Sister   . Lung cancer Brother 50    History  Substance Use Topics  . Smoking status: Current Everyday Smoker -- 0.5 packs/day for 40 years    Types: Cigarettes  . Smokeless tobacco: Not on file  . Alcohol Use: 2.0 oz/week    4 drink(s) per week    OB History    Grav Para Term Preterm Abortions TAB SAB Ect Mult Living                  Review of Systems  Constitutional: Negative for fever, chills, diaphoresis and fatigue.  HENT: Negative for congestion, rhinorrhea and sneezing.   Eyes: Negative.   Respiratory: Negative for cough, chest tightness, shortness of breath and stridor.   Cardiovascular: Negative for chest pain and leg swelling.    Gastrointestinal: Negative for nausea, vomiting, abdominal pain, diarrhea and blood in stool.  Genitourinary: Negative for frequency, hematuria, flank pain and difficulty urinating.  Musculoskeletal: Negative for back pain and arthralgias.  Skin: Positive for rash.  Neurological: Negative for dizziness, syncope, speech difficulty, weakness, numbness and headaches.    Allergies  Aspirin  Home Medications   Current Outpatient Rx  Name Route Sig Dispense Refill  . BLACK COHOSH PO Oral Take 1 capsule by mouth 2 (two) times daily.    Marland Kitchen CALCIUM CARBONATE 1500 MG PO TABS Oral Take 1 tablet by mouth 2 (two) times daily. One by mouth bid    . CETIRIZINE HCL 10 MG PO TABS Oral Take 1 tablet (10 mg total) by mouth daily. 30 tablet 6  . DESONIDE 0.05 % EX CREA Topical Apply 1 application topically 2 (two) times daily.    Marland Kitchen FLUTICASONE PROPIONATE 50 MCG/ACT NA SUSP  2 sprays each nostril daily 16 g 2  . HYDROCHLOROTHIAZIDE 25 MG PO TABS Oral Take 1 tablet (25 mg total) by mouth daily. 30 tablet 3  . HYDROCORTISONE 2.5 % EX CREA  Apply to affected area 2 times daily 30 g 1  . HYDROXYZINE HCL 25 MG PO TABS Oral Take 25 mg by mouth at bedtime as needed. For itching    . IPRATROPIUM BROMIDE HFA  17 MCG/ACT IN AERS Inhalation Inhale 2 puffs into the lungs 4 (four) times daily. Inhale 2 puff using inhaler four times a day 1 Inhaler 6  . LATANOPROST 0.005 % OP SOLN Both Eyes Place 1 drop into both eyes at bedtime.      . METHOTREXATE (ANTI-RHEUMATIC) 2.5 MG PO TABS Oral Take 12.5 mg by mouth 2 (two) times a week. Take on Monday and Tuesday    . OLANZAPINE 5 MG PO TABS Oral Take 1 tablet (5 mg total) by mouth at bedtime. 30 tablet 3  . OMEPRAZOLE 40 MG PO CPDR Oral Take 40 mg by mouth daily.    Marland Kitchen PREDNISONE 10 MG PO TABS Oral Take 5 mg by mouth daily.     Marland Kitchen VERAPAMIL HCL 80 MG PO TABS Oral Take 1 tablet (80 mg total) by mouth 3 (three) times daily. 90 tablet 2  . PREDNISONE 10 MG PO TABS Oral Take 4 tablets  (40 mg total) by mouth daily. 20 tablet 0    BP 177/94  Pulse 121  Temp 99 F (37.2 C) (Oral)  Resp 20  SpO2 93%  Physical Exam  Nursing note and vitals reviewed. Constitutional: She is oriented to person, place, and time. She appears well-developed and well-nourished. No distress.  HENT:  Head: Normocephalic.  Right Ear: External ear normal.       Moderate diffuse swelling of face with conjunctiva injection scaly patches No swelling of lips or oropharynx Small patches on chest  Pulmonary/Chest: Effort normal. No respiratory distress.  Musculoskeletal: She exhibits no edema and no tenderness.  Neurological: She is alert and oriented to person, place, and time.  Skin: Skin is warm and dry.  Psychiatric: She has a normal mood and affect. Her behavior is normal.    ED Course  Procedures (including critical care time) DIAGNOSTIC STUDIES: Oxygen Saturation is 93% on room air, adequate by my interpretation.    COORDINATION OF CARE: 1:53PM EDP discusses pt ED treatment with pt     Results for orders placed during the hospital encounter of 03/08/12  CBC WITH DIFFERENTIAL      Component Value Range   WBC 5.8  4.0 - 10.5 K/uL   RBC 4.22  3.87 - 5.11 MIL/uL   Hemoglobin 13.5  12.0 - 15.0 g/dL   HCT 60.4  54.0 - 98.1 %   MCV 97.2  78.0 - 100.0 fL   MCH 32.0  26.0 - 34.0 pg   MCHC 32.9  30.0 - 36.0 g/dL   RDW 19.1  47.8 - 29.5 %   Platelets 179  150 - 400 K/uL   Neutrophils Relative 63  43 - 77 %   Neutro Abs 3.7  1.7 - 7.7 K/uL   Lymphocytes Relative 21  12 - 46 %   Lymphs Abs 1.2  0.7 - 4.0 K/uL   Monocytes Relative 13 (*) 3 - 12 %   Monocytes Absolute 0.8  0.1 - 1.0 K/uL   Eosinophils Relative 3  0 - 5 %   Eosinophils Absolute 0.2  0.0 - 0.7 K/uL   Basophils Relative 1  0 - 1 %   Basophils Absolute 0.0  0.0 - 0.1 K/uL  POCT I-STAT, CHEM 8      Component Value Range   Sodium 140  135 - 145 mEq/L   Potassium 3.4 (*) 3.5 - 5.1 mEq/L   Chloride 97  96 - 112 mEq/L    BUN 4 (*) 6 - 23 mg/dL  Creatinine, Ser 1.00  0.50 - 1.10 mg/dL   Glucose, Bld 782 (*) 70 - 99 mg/dL   Calcium, Ion 9.56 (*) 1.12 - 1.23 mmol/L   TCO2 30  0 - 100 mmol/L   Hemoglobin 14.6  12.0 - 15.0 g/dL   HCT 21.3  08.6 - 57.8 %   No results found.    1. Dermatitis       MDM  Will try course of oral steroids, f/u with her dermatologist if no improvement    I personally performed the services described in this documentation, which was scribed in my presence.  The recorded information has been reviewed and considered.      Rolan Bucco, MD 03/08/12 1357

## 2012-03-23 ENCOUNTER — Other Ambulatory Visit: Payer: Self-pay | Admitting: *Deleted

## 2012-03-23 MED ORDER — VERAPAMIL HCL 80 MG PO TABS: 80.0000 mg | ORAL_TABLET | Freq: Three times a day (TID) | ORAL | Status: DC

## 2012-03-29 ENCOUNTER — Other Ambulatory Visit: Payer: Self-pay | Admitting: *Deleted

## 2012-03-29 MED ORDER — VERAPAMIL HCL 80 MG PO TABS: 80.0000 mg | ORAL_TABLET | Freq: Three times a day (TID) | ORAL | Status: DC

## 2012-05-30 ENCOUNTER — Other Ambulatory Visit: Payer: Self-pay | Admitting: *Deleted

## 2012-05-30 MED ORDER — HYDROCHLOROTHIAZIDE 25 MG PO TABS: 25.0000 mg | ORAL_TABLET | Freq: Every day | ORAL | Status: DC

## 2012-08-12 ENCOUNTER — Other Ambulatory Visit: Payer: Self-pay | Admitting: *Deleted

## 2012-08-14 MED ORDER — OLANZAPINE 5 MG PO TABS: 5.0000 mg | ORAL_TABLET | Freq: Every day | ORAL | Status: DC

## 2012-09-28 ENCOUNTER — Encounter: Payer: Self-pay | Admitting: Family Medicine

## 2012-09-28 ENCOUNTER — Ambulatory Visit (INDEPENDENT_AMBULATORY_CARE_PROVIDER_SITE_OTHER): Payer: Medicaid Other | Admitting: Family Medicine

## 2012-09-28 VITALS — BP 177/110 | HR 121 | Temp 98.4°F | Ht 66.0 in | Wt 170.0 lb

## 2012-09-28 DIAGNOSIS — F17200 Nicotine dependence, unspecified, uncomplicated: Secondary | ICD-10-CM

## 2012-09-28 DIAGNOSIS — I10 Essential (primary) hypertension: Secondary | ICD-10-CM

## 2012-09-28 DIAGNOSIS — J329 Chronic sinusitis, unspecified: Secondary | ICD-10-CM

## 2012-09-28 MED ORDER — AZITHROMYCIN 250 MG PO TABS: ORAL_TABLET | ORAL | Status: DC

## 2012-09-28 NOTE — Progress Notes (Signed)
  Subjective:    Patient ID: Jocelyn Shelton, female    DOB: 1956-02-02, 57 y.o.   MRN: [id number]  HPI  1. Congestion: C/o congestion, headache, sinus pressure and cough x2 weeks.  Had similar symptoms ~1 month ago.  Symptoms then return but worsened.  She has tried cough medications and nasal sprays with little relief.  She does report some R ear fullness.  She denies fever, chills, nausea or vomiting.  She does continue to smoke  2. HTN:  CHRONIC HYPERTENSION  Disease Monitoring  Blood pressure range: Does not monitor at home  Chest pain: no   Dyspnea: no   Claudication: no   Medication compliance: yes, but has not taken yet today.  Medication Side Effects  Lightheadedness: no   Urinary frequency: no   Edema: no   Preventitive Healthcare:  Exercise: no, finds it difficult to walk through her neighborhood because she is afraid of dogs   Diet Pattern: Intermittently healthy  Salt Restriction: no  3. Tobacco abuse:  She is interested in quitting.  She was smoking ~1ppd and is now down to 1/2 ppd.  She has not set a quit date for herself at this time.   Review of Systems     Objective:   Physical Exam  Constitutional: She appears well-nourished. No distress.  HENT:  Head: Normocephalic and atraumatic.  Mouth/Throat: Oropharynx is clear and moist. No oropharyngeal exudate.  + upper R frontal sinus tenderness TM normal bilaterally    Eyes: No scleral icterus.  Neck: Neck supple.  Cardiovascular: Normal rate and regular rhythm.   Pulmonary/Chest: Effort normal and breath sounds normal.  Musculoskeletal: She exhibits no edema.  Lymphadenopathy:    She has no cervical adenopathy.          Assessment & Plan:

## 2012-09-28 NOTE — Assessment & Plan Note (Signed)
BP quite elevated today, however she has not taken her medications yet.  Her BP have been quite high at her other recent visits as well.  Discussed plan to help with better blood pressure control.  Please see [facility] portion of EMR for full recommendations.

## 2012-09-28 NOTE — Assessment & Plan Note (Signed)
COunseled on smoking cessation.  Given number to [place] quit line and instructed to make follow up with Dr. Raymondo Band in pharmacy clinic.

## 2012-09-28 NOTE — Assessment & Plan Note (Signed)
Sinusitis with second sickening and prolonged course.  WIll treat as bacterial  With azithromycin .  Instructed to follow up if not improving or worsening.

## 2012-09-28 NOTE — Patient Instructions (Signed)
General Instructions: Thank you for coming in today, it was good to see you I would like for you to make an appointment to see Dr. Raymondo Band to discuss smoking cessation.  Good job on cutting back You can also contact the [place] quit line for assistance Follow up with me in one month to check your blood pressure again.  Be sure to take your medication at least 1 hour before coming in.     Treatment Goals:  Goals (1 Years of Data) as of 09/28/12         As of Today 03/08/12 02/01/12 11/23/11 06/29/11     Blood Pressure    . Blood Pressure < 140/90  177/110 177/94 180/100 159/93 129/78     Lifestyle    . Quit smoking / using tobacco            Progress Toward Treatment Goals:  Treatment Goal 09/28/2012  Blood pressure unchanged  Stop smoking smoking less    Self Care Goals & Plans:  Self Care Goal 09/28/2012  Manage my medications bring my medications to every visit; refill my medications on time  Eat healthy foods eat foods that are low in salt; eat more vegetables; drink diet soda or water instead of juice or soda; eat baked foods instead of fried foods  Be physically active find a convenient safe place to exercise; take a walk every day; find an activity I enjoy  Stop smoking go to the [employer] ([employer]); call [facility] (1-800-QUIT-NOW)       Care Management & Community Referrals:  Referral 09/28/2012  Referrals made for care management support pharmacy clinic  Referrals made to community resources smoking cessation     Sinusitis Sinusitis is redness, soreness, and swelling (inflammation) of the paranasal sinuses. Paranasal sinuses are air pockets within the bones of your face (beneath the eyes, the middle of the forehead, or above the eyes). In healthy paranasal sinuses, mucus is able to drain out, and air is able to circulate through them by way of your nose. However, when your paranasal sinuses are inflamed, mucus and air can become trapped. This can allow  bacteria and other germs to grow and cause infection. Sinusitis can develop quickly and last only a short time (acute) or continue over a long period (chronic). Sinusitis that lasts for more than 12 weeks is considered chronic.  CAUSES  Causes of sinusitis include:  Allergies.  Structural abnormalities, such as displacement of the cartilage that separates your nostrils (deviated septum), which can decrease the air flow through your nose and sinuses and affect sinus drainage.  Functional abnormalities, such as when the small hairs (cilia) that line your sinuses and help remove mucus do not work properly or are not present. SYMPTOMS  Symptoms of acute and chronic sinusitis are the same. The primary symptoms are pain and pressure around the affected sinuses. Other symptoms include:  Upper toothache.  Earache.  Headache.  Bad breath.  Decreased sense of smell and taste.  A cough, which worsens when you are lying flat.  Fatigue.  Fever.  Thick drainage from your nose, which often is green and may contain pus (purulent).  Swelling and warmth over the affected sinuses. DIAGNOSIS  Your caregiver will perform a physical exam. During the exam, your caregiver may:  Look in your nose for signs of abnormal growths in your nostrils (nasal polyps).  Tap over the affected sinus to check for signs of infection.  View the inside of your sinuses (endoscopy)  with a special imaging device with a light attached (endoscope), which is inserted into your sinuses. If your caregiver suspects that you have chronic sinusitis, one or more of the following tests may be recommended:  Allergy tests.  Nasal culture A sample of mucus is taken from your nose and sent to a lab and screened for bacteria.  Nasal cytology A sample of mucus is taken from your nose and examined by your caregiver to determine if your sinusitis is related to an allergy. TREATMENT  Most cases of acute sinusitis are related to a  viral infection and will resolve on their own within 10 days. Sometimes medicines are prescribed to help relieve symptoms (pain medicine, decongestants, nasal steroid sprays, or saline sprays).  However, for sinusitis related to a bacterial infection, your caregiver will prescribe antibiotic medicines. These are medicines that will help kill the bacteria causing the infection.  Rarely, sinusitis is caused by a fungal infection. In theses cases, your caregiver will prescribe antifungal medicine. For some cases of chronic sinusitis, surgery is needed. Generally, these are cases in which sinusitis recurs more than 3 times per year, despite other treatments. HOME CARE INSTRUCTIONS   Drink plenty of water. Water helps thin the mucus so your sinuses can drain more easily.  Use a humidifier.  Inhale steam 3 to 4 times a day (for example, sit in the bathroom with the shower running).  Apply a warm, moist washcloth to your face 3 to 4 times a day, or as directed by your caregiver.  Use saline nasal sprays to help moisten and clean your sinuses.  Take over-the-counter or prescription medicines for pain, discomfort, or fever only as directed by your caregiver. SEEK IMMEDIATE MEDICAL CARE IF:  You have increasing pain or severe headaches.  You have nausea, vomiting, or drowsiness.  You have swelling around your face.  You have vision problems.  You have a stiff neck.  You have difficulty breathing. MAKE SURE YOU:   Understand these instructions.  Will watch your condition.  Will get help right away if you are not doing well or get worse. Document Released: 07/13/2005 Document Revised: 10/05/2011 Document Reviewed: 07/28/2011 [facility] Patient Information 2013 [place], [place].

## 2012-09-29 ENCOUNTER — Encounter: Payer: Self-pay | Admitting: *Deleted

## 2012-09-29 DIAGNOSIS — I10 Essential (primary) hypertension: Secondary | ICD-10-CM | POA: Insufficient documentation

## 2012-10-03 ENCOUNTER — Other Ambulatory Visit: Payer: Self-pay | Admitting: *Deleted

## 2012-10-03 MED ORDER — OMEPRAZOLE 40 MG PO CPDR: 40.0000 mg | DELAYED_RELEASE_CAPSULE | Freq: Every day | ORAL | Status: DC

## 2012-10-14 ENCOUNTER — Ambulatory Visit (INDEPENDENT_AMBULATORY_CARE_PROVIDER_SITE_OTHER): Payer: Medicaid Other | Admitting: Pharmacist

## 2012-10-14 ENCOUNTER — Encounter: Payer: Self-pay | Admitting: Pharmacist

## 2012-10-14 VITALS — BP 149/95 | HR 98 | Ht 67.0 in | Wt 165.0 lb

## 2012-10-14 DIAGNOSIS — Z720 Tobacco use: Secondary | ICD-10-CM

## 2012-10-14 DIAGNOSIS — F17200 Nicotine dependence, unspecified, uncomplicated: Secondary | ICD-10-CM

## 2012-10-14 NOTE — Progress Notes (Signed)
  Subjective:    Patient ID: Jocelyn Shelton, female    DOB: Dec 24, 1955, 57 y.o.   MRN: [id number]  HPI Patient presents in good spirits for a tobacco cessation attempt. Endorses stable attitude and mood on current psychiatric regimen. Is motivated to quit smoking because of her grandson.   Age when started using tobacco on a daily basis: 16 Number of Cigarettes per day: 10 Brand smoked [employer], [place] (cheapest brands).  Estimated Nicotine intake per day ~10 mg.   Smokes first cigarette: Right after waking Denies waking to smoke these days (used to in the past)  Longest time ever been tobacco free: 2 days after leaving the hospital. What Medications (NRT, bupropion, varenicline) used in past includes: Never used. Rates IMPORTANCE of quitting tobacco on 1-10 scale: 10 Quitting is highly important to the patient. Rates READINESS of quitting tobacco on 1-10 scale of 5-6. Rates CONFIDENCE of quitting tobacco on 1-10 scale of 6. Triggers to use tobacco include food - usually enjoys smoking after meals or taking a cigarette break while cooking or doing chores.  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     Set a quit date of Monday, March 31st depending on access to nicotine patches.  Plan to initiate nicotine patches 14 mg daily for 2 months once patient can afford them. Patient counseled on purpose, proper use, and potential adverse effects, including   Written information provided. Provided information on 1 800-QUIT NOW support program. F/U Rx Clinic in 3 weeks.   Total time in face-to-face counseling 30 minutes.  Patient seen with Tomi Bamberger PharmD, Pharmacy Resident and Beatrix Shipper, PharmD candidate. Marland Kitchen

## 2012-10-14 NOTE — Assessment & Plan Note (Signed)
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    Set a quit date of Monday, March 31st depending on access to nicotine patches. Plan to initiate nicotine patches 14 mg daily for 2 months once patient can afford them. Patient counseled on purpose, proper use, and potential adverse effects, including   Written information provided. Provided information on 1 800-QUIT NOW support program. F/U Rx Clinic in 3 weeks.   Total time in face-to-face counseling 30 minutes.  Patient seen with Tomi Bamberger PharmD, Pharmacy Resident and Beatrix Shipper, PharmD candidate.

## 2012-10-14 NOTE — Patient Instructions (Addendum)
It was good to see you today. Thank you for bringing your medications with you to clinic.  Congratulations on your smoking cessation efforts! We discussed the importance of quitting smoking to improve your breathing, so that you can be more active with your grandson.  Your quit date is set for Monday, March 31.  Try using the nicotine patches 14 mg daily to start, but be aware that you can use a combination of the options we discussed. Use the patches for about 2 months. If you do well on these patches, we will lower the dose.   As you quit, try to keep your hands and mind busy by doing things you enjoy (like sewing) to keep you occupied. As the weather changes, consider walking outside and increasing physical activity.   Monitor changes in your coughing pattern. Your coughing might get worse and more productive at first, but should get better with time.    Call 1-800-QUIT-NOW to set up a schedule to help you in your smoking cessation efforts.  Follow-up in the pharmacy clinic 1 week following your appointment with Dr. Ashley Royalty.

## 2012-10-17 NOTE — Progress Notes (Signed)
Patient ID: Jocelyn Shelton, female   DOB: 08/21/1955, 57 y.o.   MRN: [id number] Reviewed: Agree with Dr. Macky Lower documentation and management.

## 2012-11-01 ENCOUNTER — Encounter: Payer: Self-pay | Admitting: Family Medicine

## 2012-11-01 ENCOUNTER — Ambulatory Visit (INDEPENDENT_AMBULATORY_CARE_PROVIDER_SITE_OTHER): Payer: Medicaid Other | Admitting: Family Medicine

## 2012-11-01 VITALS — BP 164/100 | HR 100 | Ht 66.0 in | Wt 167.8 lb

## 2012-11-01 DIAGNOSIS — Z720 Tobacco use: Secondary | ICD-10-CM

## 2012-11-01 DIAGNOSIS — I739 Peripheral vascular disease, unspecified: Secondary | ICD-10-CM

## 2012-11-01 DIAGNOSIS — F17200 Nicotine dependence, unspecified, uncomplicated: Secondary | ICD-10-CM

## 2012-11-01 DIAGNOSIS — I10 Essential (primary) hypertension: Secondary | ICD-10-CM

## 2012-11-01 MED ORDER — METOPROLOL TARTRATE 25 MG PO TABS: 25.0000 mg | ORAL_TABLET | Freq: Two times a day (BID) | ORAL | Status: DC

## 2012-11-01 NOTE — Patient Instructions (Addendum)
Thank you for coming in today, it was good to see you Your quit date that you think would be good is your grandson's [birth year] Follow up with pharmacy clinic  I am adding a new medication for your blood pressure as well, follow up with me in two weeks.

## 2012-11-07 DIAGNOSIS — I739 Peripheral vascular disease, unspecified: Secondary | ICD-10-CM | POA: Insufficient documentation

## 2012-11-07 NOTE — Assessment & Plan Note (Signed)
History of claudication, with worsening over the past few months.  Pulses palpable but diminished.  Will have her see Dr. Raymondo Band for ABI's

## 2012-11-07 NOTE — Assessment & Plan Note (Addendum)
BP remains elevated will add metoprolol given pulse rate of 100.  FOllow up in two weeks to recheck BP.

## 2012-11-07 NOTE — Assessment & Plan Note (Signed)
Discussed setting a new quit date, which she picks 12/07/12 as her quit date.  Unable to afford patches.  Plans to f/u with Dr. Raymondo Band

## 2012-11-07 NOTE — Progress Notes (Signed)
  Subjective:    Patient ID: Jocelyn Shelton, female    DOB: 18-Jun-1956, 56 y.o.   MRN: [id number]  Hypertension   1. HTN: CHRONIC HYPERTENSION  Disease Monitoring  Blood pressure range: No self monitoring at home  Chest pain: no   Dyspnea: no   Claudication: yes, cramping of legs with walking short distances.    Medication compliance: yes  Medication Side Effects  Lightheadedness: no   Urinary frequency: no   Edema: no   Preventitive Healthcare:  Exercise: no   Diet Pattern: Varies, fried chicken last night for dinner.  Unable to recall other items form yesterday.      2. Tobacco abuse:  Reports smoking 5cigs per day.  Has met with Dr. Raymondo Band to discuss quitting smoking.  Quit date initially set as 3/31, but she felt she was not ready to quit at that time.  She is unable to afford patches.  Past Medical History  Diagnosis Date  . Hypertension   . Reflux   . Scleroderma   . Rheumatoid arthritis     Review of Systems Per HPI    Objective:   Physical Exam  Constitutional: She appears well-nourished. No distress.  HENT:  Head: Normocephalic and atraumatic.  Cardiovascular: Normal rate and regular rhythm.   Pulmonary/Chest: Effort normal and breath sounds normal.  Musculoskeletal: She exhibits no edema and no tenderness.  LE pulses palpable but diminished.           Assessment & Plan:

## 2012-11-14 ENCOUNTER — Other Ambulatory Visit: Payer: Self-pay

## 2012-11-14 DIAGNOSIS — Z1231 Encounter for screening mammogram for malignant neoplasm of breast: Secondary | ICD-10-CM

## 2012-12-01 ENCOUNTER — Other Ambulatory Visit: Payer: Self-pay | Admitting: *Deleted

## 2012-12-01 MED ORDER — VERAPAMIL HCL 80 MG PO TABS: 80.0000 mg | ORAL_TABLET | Freq: Three times a day (TID) | ORAL | Status: DC

## 2012-12-06 ENCOUNTER — Other Ambulatory Visit: Payer: Self-pay | Admitting: *Deleted

## 2012-12-07 MED ORDER — VERAPAMIL HCL 80 MG PO TABS: 80.0000 mg | ORAL_TABLET | Freq: Three times a day (TID) | ORAL | Status: DC

## 2012-12-14 ENCOUNTER — Other Ambulatory Visit: Payer: Self-pay | Admitting: Family Medicine

## 2012-12-15 ENCOUNTER — Other Ambulatory Visit: Payer: Self-pay | Admitting: *Deleted

## 2012-12-15 MED ORDER — HYDROCHLOROTHIAZIDE 25 MG PO TABS: 25.0000 mg | ORAL_TABLET | Freq: Every day | ORAL | Status: DC

## 2012-12-15 NOTE — Telephone Encounter (Signed)
Requested Prescriptions   Pending Prescriptions Disp Refills  . hydrochlorothiazide (HYDRODIURIL) 25 MG tablet 30 tablet 3    Sig: Take 1 tablet (25 mg total) by mouth daily.   Wyatt Haste, RN-BSN

## 2012-12-21 ENCOUNTER — Ambulatory Visit: Payer: Medicaid Other

## 2012-12-30 ENCOUNTER — Ambulatory Visit: Payer: Medicaid Other

## 2013-01-08 ENCOUNTER — Other Ambulatory Visit: Payer: Self-pay | Admitting: Family Medicine

## 2013-01-24 ENCOUNTER — Other Ambulatory Visit: Payer: Self-pay | Admitting: Family Medicine

## 2013-01-24 NOTE — Telephone Encounter (Signed)
Patient is requesting a refill on verapamil 80mg . The pharmacy only gave her a qty# 30 but she needs 90 since she takes 3 a day. She has schedule an appointment on 7/8. JW

## 2013-01-30 ENCOUNTER — Ambulatory Visit: Payer: Medicaid Other

## 2013-01-30 ENCOUNTER — Ambulatory Visit
Admission: RE | Admit: 2013-01-30 | Discharge: 2013-01-30 | Disposition: A | Discharge status: Home / Self Care | Payer: Medicaid Other | Source: Ambulatory Visit

## 2013-01-30 DIAGNOSIS — Z1231 Encounter for screening mammogram for malignant neoplasm of breast: Secondary | ICD-10-CM

## 2013-01-31 ENCOUNTER — Encounter: Payer: Self-pay | Admitting: Family Medicine

## 2013-01-31 ENCOUNTER — Ambulatory Visit (INDEPENDENT_AMBULATORY_CARE_PROVIDER_SITE_OTHER): Payer: Medicaid Other | Admitting: Family Medicine

## 2013-01-31 VITALS — BP 160/100 | HR 88 | Temp 98.1°F | Ht 66.0 in | Wt 173.0 lb

## 2013-01-31 DIAGNOSIS — Z720 Tobacco use: Secondary | ICD-10-CM

## 2013-01-31 DIAGNOSIS — I739 Peripheral vascular disease, unspecified: Secondary | ICD-10-CM

## 2013-01-31 DIAGNOSIS — I10 Essential (primary) hypertension: Secondary | ICD-10-CM

## 2013-01-31 DIAGNOSIS — F17200 Nicotine dependence, unspecified, uncomplicated: Secondary | ICD-10-CM

## 2013-01-31 MED ORDER — METOPROLOL TARTRATE 50 MG PO TABS: 50.0000 mg | ORAL_TABLET | Freq: Two times a day (BID) | ORAL | Status: DC

## 2013-01-31 NOTE — Assessment & Plan Note (Signed)
Fully aware that she needs to quit smoking for her health, but admits that she is not ready to quit. Understands that smoking is significantly contributing to her increased BP and to peripheral artery disease. Previous quit dates in March 2014, and May 2014 were unsuccessful. Continues to smoke 5-6 cigs per day. Reports she has cut down over the years, with 0.5ppd x 35+ year smoking hx. Advised her that resources will be available when she is ready to discuss quitting again.

## 2013-01-31 NOTE — Progress Notes (Signed)
Subjective:     Patient ID: Jocelyn Shelton, female   DOB: 09-12-1955, 57 y.o.   MRN: [id number]  HPI  CHRONIC HYPERTENSION  Disease Monitoring  Blood pressure range: 140-160 / 100s  Pulse: 88 (improved from  Chest pain: no   Dyspnea: no   Claudication: yes (bilateral lower extremity, calves, with too much walking, improves with rest)  Medication compliance: yes (taking Metoprolol 25mg  BID, Verapmail 80mg  TID, HCTZ 25mg  daily) Medication Side Effects  Lightheadedness: no   Fatigue: no  Urinary frequency: no   Edema: yes (hands, feet, knees, attributes some of this to her Rheumatoid Arthritis)   Preventitive Healthcare:  Exercise: yes (walking, discussed importance of lifestyle on BP)  Salt Restriction: No, needs to improve. Frequently eats home cooked foods with some added salt, often canned food, limit eating out   TOBACCO ABUSE Current smoker - 5-6 cigs per day.       Hx of 0.5ppd x 30-19yrs Within past 3 months has had apts with PCP and Smoking Cessation clinic, but was unsuccessful with 2 quit dates. Admits that she is not ready to quit at this time, even though she knows it is critical to her health.   PMH - Rheumatoid Arthritis (follows Dr. Kellie Simmering, Rheumatology) Social hx - Current smoker   Review of Systems  All systems negative except per ROS above.     Objective:   Physical Exam  BP 160/100  Pulse 88  Temp(Src) 98.1 F (36.7 C) (Oral)  Ht 5\' 6"  (1.676 m)  Wt 173 lb (78.472 kg)  BMI 27.94 kg/m2  General - pleasant, WDWN, NAD HEENT - PERRLA, hearing intact, pharynx clear Neck - supple, non-tender, no LAD Heart - Regular rate and rhythm.  No murmurs, gallops or rubs.    Lungs:  Normal respiratory effort, chest expands symmetrically. Lungs are clear to auscultation, no crackles or wheezes. Extremities:  No cyanosis, edema, or deformity noted. Calves non-tender to palpation Vascular - diminished +1 LE pulses (dorsalis pedis / post tib) Psych - alert,  oriented

## 2013-01-31 NOTE — Patient Instructions (Addendum)
Thanks for coming in today! It was great to meet you!  Your blood pressure is still elevated today (160/100) and it is similar to your last visit in April.  We decided to increase your Lopressor from 25mg  to 50mg  two times a day. (Continue to use up your 25mg  pills as we discussed, you should be taking 2 in the morning and 2 at night for the next 9 days or so, and then you should be able to pick up the new 50mg  pills. Call if there is any problem.  Continue your other blood pressure medications. Try to get your home blood pressure cuff working and check it at various times during the day.  Work on decreasing salt in food, eating less canned and outside prepared foods.  Continue to consider quitting smoking when you are ready to. Please let me know if you ever need help or want to discuss this further.  Please follow-up in 1-2 weeks with Dr. Raymondo Band in the Pharmacy Clinic for Ambulatory Blood Pressure Monitoring. Please follow-up in 2-3 months with PCP, unless sooner follow-up is indicated from Ambulatory BP monitor.  If you have any concerns please call the clinic. If your symptoms worse or you feel extremely fatigued, headaches, chest pain, or anything you feel is an emergency, please go to the emergency room immediately.

## 2013-01-31 NOTE — Assessment & Plan Note (Signed)
Continues to report claudication symptoms in her bilateral LEs (calves, feet) with too much walking, and improves with rest. Did not schedule appointment with Dr. Raymondo Band for ABIs. Advised to quit smoking. Future consideration - Re-schedule apt to have ABIs done to further investigate PAD.

## 2013-01-31 NOTE — Assessment & Plan Note (Addendum)
BP mostly unchanged today (R-140/100, L-160/100) from previous visit (160/100), despite addition of Lopressor 25mg  BID to her BP meds (HCTZ 25mg , Verapmail 80mg ). Her pulse has improved to 88. Tolerating her meds well, denies any fatigue, lightheaded, dizziness, or other concerning symptoms. No other BP related complaints. Feels anxiety is related to high BP readings, has BP cuff at home but will try to see if it still works.  Will increase Lopressor to 50mg  BID. Advised importance of quitting smoking, decreasing salt, increasing physical activity. Scheduled to follow-up in Pharmacy Clinic with Dr. Raymondo Band for Ambulatory BP Monitoring. Future considerations - if BP is running high throughout the day, can add ACE/ARB in combination with HCTZ. Likely most important change is lifestyle, quitting smoking.

## 2013-02-21 ENCOUNTER — Other Ambulatory Visit: Payer: Self-pay | Admitting: Family Medicine

## 2013-02-21 ENCOUNTER — Other Ambulatory Visit: Payer: Self-pay | Admitting: *Deleted

## 2013-02-22 MED ORDER — OLANZAPINE 5 MG PO TABS: ORAL_TABLET | ORAL | Status: DC

## 2013-02-28 ENCOUNTER — Telehealth: Payer: Self-pay | Admitting: *Deleted

## 2013-02-28 ENCOUNTER — Inpatient Hospital Stay (HOSPITAL_COMMUNITY)
Admission: EM | Admit: 2013-02-28 | Discharge: 2013-03-02 | DRG: 372 | Disposition: A | Discharge status: Home / Self Care | Payer: Medicaid Other | Attending: Family Medicine | Admitting: Family Medicine

## 2013-02-28 ENCOUNTER — Encounter (HOSPITAL_COMMUNITY): Payer: Self-pay

## 2013-02-28 DIAGNOSIS — M069 Rheumatoid arthritis, unspecified: Secondary | ICD-10-CM | POA: Diagnosis present

## 2013-02-28 DIAGNOSIS — A049 Bacterial intestinal infection, unspecified: Principal | ICD-10-CM | POA: Diagnosis present

## 2013-02-28 DIAGNOSIS — D649 Anemia, unspecified: Secondary | ICD-10-CM | POA: Diagnosis present

## 2013-02-28 DIAGNOSIS — I10 Essential (primary) hypertension: Secondary | ICD-10-CM | POA: Diagnosis present

## 2013-02-28 DIAGNOSIS — E860 Dehydration: Secondary | ICD-10-CM | POA: Diagnosis present

## 2013-02-28 DIAGNOSIS — T451X5A Adverse effect of antineoplastic and immunosuppressive drugs, initial encounter: Secondary | ICD-10-CM | POA: Diagnosis present

## 2013-02-28 DIAGNOSIS — R197 Diarrhea, unspecified: Secondary | ICD-10-CM | POA: Diagnosis present

## 2013-02-28 DIAGNOSIS — T50995A Adverse effect of other drugs, medicaments and biological substances, initial encounter: Secondary | ICD-10-CM | POA: Diagnosis present

## 2013-02-28 DIAGNOSIS — E871 Hypo-osmolality and hyponatremia: Secondary | ICD-10-CM | POA: Diagnosis present

## 2013-02-28 DIAGNOSIS — R109 Unspecified abdominal pain: Secondary | ICD-10-CM | POA: Diagnosis present

## 2013-02-28 DIAGNOSIS — D696 Thrombocytopenia, unspecified: Secondary | ICD-10-CM | POA: Diagnosis present

## 2013-02-28 DIAGNOSIS — D61818 Other pancytopenia: Secondary | ICD-10-CM

## 2013-02-28 DIAGNOSIS — F17200 Nicotine dependence, unspecified, uncomplicated: Secondary | ICD-10-CM | POA: Diagnosis present

## 2013-02-28 DIAGNOSIS — K3189 Other diseases of stomach and duodenum: Secondary | ICD-10-CM | POA: Diagnosis present

## 2013-02-28 DIAGNOSIS — R1013 Epigastric pain: Secondary | ICD-10-CM | POA: Diagnosis present

## 2013-02-28 DIAGNOSIS — J4489 Other specified chronic obstructive pulmonary disease: Secondary | ICD-10-CM | POA: Diagnosis present

## 2013-02-28 DIAGNOSIS — F339 Major depressive disorder, recurrent, unspecified: Secondary | ICD-10-CM | POA: Diagnosis present

## 2013-02-28 DIAGNOSIS — M349 Systemic sclerosis, unspecified: Secondary | ICD-10-CM | POA: Diagnosis present

## 2013-02-28 DIAGNOSIS — K219 Gastro-esophageal reflux disease without esophagitis: Secondary | ICD-10-CM | POA: Diagnosis present

## 2013-02-28 DIAGNOSIS — IMO0002 Reserved for concepts with insufficient information to code with codable children: Secondary | ICD-10-CM

## 2013-02-28 DIAGNOSIS — J449 Chronic obstructive pulmonary disease, unspecified: Secondary | ICD-10-CM | POA: Diagnosis present

## 2013-02-28 DIAGNOSIS — E876 Hypokalemia: Secondary | ICD-10-CM | POA: Diagnosis present

## 2013-02-28 DIAGNOSIS — Z8249 Family history of ischemic heart disease and other diseases of the circulatory system: Secondary | ICD-10-CM

## 2013-02-28 LAB — URINALYSIS, ROUTINE W REFLEX MICROSCOPIC
Bilirubin Urine: NEGATIVE
Nitrite: NEGATIVE
Specific Gravity, Urine: 1.005 (ref 1.005–1.030)
Urobilinogen, UA: 0.2 mg/dL (ref 0.0–1.0)

## 2013-02-28 LAB — CBC WITH DIFFERENTIAL/PLATELET
Basophils Relative: 0 % (ref 0–1)
Eosinophils Absolute: 0 10*3/uL (ref 0.0–0.7)
HCT: 31.9 % — ABNORMAL LOW (ref 36.0–46.0)
Hemoglobin: 11.3 g/dL — ABNORMAL LOW (ref 12.0–15.0)
MCH: 33.6 pg (ref 26.0–34.0)
MCHC: 35.4 g/dL (ref 30.0–36.0)
Monocytes Absolute: 0.9 10*3/uL (ref 0.1–1.0)
Neutro Abs: 5.6 10*3/uL (ref 1.7–7.7)
RDW: 13.9 % (ref 11.5–15.5)

## 2013-02-28 LAB — COMPREHENSIVE METABOLIC PANEL
ALT: 19 U/L (ref 0–35)
BUN: 14 mg/dL (ref 6–23)
CO2: 31 mEq/L (ref 19–32)
Calcium: 8.7 mg/dL (ref 8.4–10.5)
Creatinine, Ser: 1.01 mg/dL (ref 0.50–1.10)
GFR calc Af Amer: 70 mL/min — ABNORMAL LOW (ref >90)
GFR calc non Af Amer: 60 mL/min — ABNORMAL LOW (ref >90)
Glucose, Bld: 120 mg/dL — ABNORMAL HIGH (ref 70–99)

## 2013-02-28 LAB — OCCULT BLOOD X 1 CARD TO LAB, STOOL: Fecal Occult Bld: NEGATIVE

## 2013-02-28 LAB — URINE MICROSCOPIC-ADD ON

## 2013-02-28 MED ORDER — SODIUM CHLORIDE 0.9 % IV BOLUS (SEPSIS)
1000.0000 mL | Freq: Once | INTRAVENOUS | Status: AC
  Administered 2013-02-28: 1000 mL via INTRAVENOUS

## 2013-02-28 MED ORDER — ONDANSETRON HCL 4 MG/2ML IJ SOLN: 4.0000 mg | Freq: Three times a day (TID) | INTRAMUSCULAR | Status: AC | PRN

## 2013-02-28 MED ORDER — POTASSIUM CHLORIDE CRYS ER 20 MEQ PO TBCR
40.0000 meq | EXTENDED_RELEASE_TABLET | Freq: Once | ORAL | Status: AC
  Administered 2013-02-28: 40 meq via ORAL
  Filled 2013-02-28: qty 2

## 2013-02-28 MED ORDER — ONDANSETRON HCL 4 MG/2ML IJ SOLN: 4.0000 mg | Freq: Four times a day (QID) | INTRAMUSCULAR | Status: DC | PRN

## 2013-02-28 MED ORDER — SULFASALAZINE 500 MG PO TABS
1000.0000 mg | ORAL_TABLET | Freq: Every morning | ORAL | Status: DC
  Administered 2013-03-01: 1000 mg via ORAL
  Filled 2013-02-28: qty 2

## 2013-02-28 MED ORDER — ONDANSETRON HCL 4 MG PO TABS: 4.0000 mg | ORAL_TABLET | Freq: Four times a day (QID) | ORAL | Status: DC | PRN

## 2013-02-28 MED ORDER — POTASSIUM CHLORIDE IN NACL 40-0.9 MEQ/L-% IV SOLN
INTRAVENOUS | Status: DC
  Administered 2013-02-28 – 2013-03-01 (×2): via INTRAVENOUS
  Filled 2013-02-28 (×4): qty 1000

## 2013-02-28 MED ORDER — FOLIC ACID 1 MG PO TABS
5.0000 mg | ORAL_TABLET | Freq: Every day | ORAL | Status: DC
  Administered 2013-02-28 – 2013-03-02 (×3): 5 mg via ORAL
  Filled 2013-02-28 (×3): qty 5

## 2013-02-28 MED ORDER — HEPARIN SODIUM (PORCINE) 5000 UNIT/ML IJ SOLN
5000.0000 [IU] | Freq: Three times a day (TID) | INTRAMUSCULAR | Status: DC
  Administered 2013-02-28 – 2013-03-01 (×2): 5000 [IU] via SUBCUTANEOUS
  Filled 2013-02-28 (×5): qty 1

## 2013-02-28 MED ORDER — VERAPAMIL HCL 80 MG PO TABS
80.0000 mg | ORAL_TABLET | Freq: Three times a day (TID) | ORAL | Status: DC
  Administered 2013-02-28 – 2013-03-02 (×5): 80 mg via ORAL
  Filled 2013-02-28 (×7): qty 1

## 2013-02-28 MED ORDER — ACETAMINOPHEN 325 MG PO TABS
650.0000 mg | ORAL_TABLET | Freq: Four times a day (QID) | ORAL | Status: DC | PRN
  Administered 2013-03-01 (×2): 650 mg via ORAL
  Filled 2013-02-28 (×2): qty 2

## 2013-02-28 MED ORDER — ALUM & MAG HYDROXIDE-SIMETH 200-200-20 MG/5ML PO SUSP
30.0000 mL | Freq: Four times a day (QID) | ORAL | Status: DC | PRN
  Filled 2013-02-28: qty 30

## 2013-02-28 MED ORDER — PANTOPRAZOLE SODIUM 40 MG PO TBEC
40.0000 mg | DELAYED_RELEASE_TABLET | Freq: Every day | ORAL | Status: DC
  Administered 2013-02-28 – 2013-03-02 (×3): 40 mg via ORAL
  Filled 2013-02-28 (×3): qty 1

## 2013-02-28 MED ORDER — PREDNISONE 20 MG PO TABS
20.0000 mg | ORAL_TABLET | Freq: Every day | ORAL | Status: DC
  Administered 2013-02-28 – 2013-03-01 (×2): 20 mg via ORAL
  Filled 2013-02-28 (×2): qty 1

## 2013-02-28 MED ORDER — SODIUM CHLORIDE 0.9 % IJ SOLN: 3.0000 mL | Freq: Two times a day (BID) | INTRAMUSCULAR | Status: DC

## 2013-02-28 MED ORDER — METOPROLOL TARTRATE 50 MG PO TABS
50.0000 mg | ORAL_TABLET | Freq: Two times a day (BID) | ORAL | Status: DC
  Administered 2013-02-28 – 2013-03-02 (×4): 50 mg via ORAL
  Filled 2013-02-28 (×5): qty 1

## 2013-02-28 MED ORDER — IPRATROPIUM BROMIDE HFA 17 MCG/ACT IN AERS
2.0000 | INHALATION_SPRAY | RESPIRATORY_TRACT | Status: DC | PRN
  Administered 2013-03-01 – 2013-03-02 (×3): 2 via RESPIRATORY_TRACT
  Filled 2013-02-28: qty 12.9

## 2013-02-28 MED ORDER — PNEUMOCOCCAL VAC POLYVALENT 25 MCG/0.5ML IJ INJ
0.5000 mL | INJECTION | INTRAMUSCULAR | Status: AC
  Administered 2013-03-01: 0.5 mL via INTRAMUSCULAR
  Filled 2013-02-28: qty 0.5

## 2013-02-28 MED ORDER — ACETAMINOPHEN 650 MG RE SUPP: 650.0000 mg | Freq: Four times a day (QID) | RECTAL | Status: DC | PRN

## 2013-02-28 MED ORDER — NICOTINE 7 MG/24HR TD PT24
7.0000 mg | MEDICATED_PATCH | Freq: Every day | TRANSDERMAL | Status: DC
  Administered 2013-02-28 – 2013-03-02 (×3): 7 mg via TRANSDERMAL
  Filled 2013-02-28 (×3): qty 1

## 2013-02-28 MED ORDER — POTASSIUM CHLORIDE 10 MEQ/100ML IV SOLN
10.0000 meq | INTRAVENOUS | Status: AC
  Administered 2013-02-28 (×3): 10 meq via INTRAVENOUS
  Filled 2013-02-28 (×4): qty 100

## 2013-02-28 MED ORDER — SODIUM CHLORIDE 0.9 % IV SOLN: INTRAVENOUS | Status: DC

## 2013-02-28 MED ORDER — LORATADINE 10 MG PO TABS
10.0000 mg | ORAL_TABLET | Freq: Every day | ORAL | Status: DC
  Administered 2013-02-28 – 2013-03-02 (×3): 10 mg via ORAL
  Filled 2013-02-28 (×4): qty 1

## 2013-02-28 MED ORDER — OLANZAPINE 5 MG PO TABS
5.0000 mg | ORAL_TABLET | Freq: Every day | ORAL | Status: DC
  Administered 2013-02-28 – 2013-03-01 (×2): 5 mg via ORAL
  Filled 2013-02-28 (×3): qty 1

## 2013-02-28 MED ORDER — FLUTICASONE PROPIONATE 50 MCG/ACT NA SUSP
2.0000 | Freq: Every day | NASAL | Status: DC | PRN
  Filled 2013-02-28: qty 16

## 2013-02-28 NOTE — Progress Notes (Signed)
Pt admitted to [facility] unit [facility] from the ED. Received report from [place], Charity fundraiser. Pt is alert and oriented to staff, call bell, and room. Bed in lowest position. Call bell within reach. Admitting provider contacted. Full assessment to Epic. Will continue to monitor. Fayne Norrie, RN

## 2013-02-28 NOTE — ED Provider Notes (Signed)
CSN: [id number]     Arrival date & time 02/28/13  1328 History     First MD Initiated Contact with Patient 02/28/13 1348     Chief Complaint  Patient presents with  . Fatigue   (Consider location/radiation/quality/duration/timing/severity/associated sxs/prior Treatment) HPI Comments: Patient presents with complaint of diarrhea for the past 7 days. Patient denies watery stools but states he has had 6-7 episodes per day of soft, poorly formed stools during this time. She also admits to fatigue and decreased oral intake. She has felt generally weak. She denies fever, chest pain, shortness of breath, abdominal pain, urinary symptoms, decreased urination. No skin rashes or fevers.  The history is provided by the patient and medical records.    Past Medical History  Diagnosis Date  . Hypertension   . Reflux   . Scleroderma   . Rheumatoid arthritis(714.0)    Past Surgical History  Procedure Laterality Date  . Appendectomy  90's   Family History  Problem Relation Age of Onset  . Hypertension Father   . Heart attack Mother   . Diabetes Daughter   . Diabetes Sister   . Lung cancer Brother 50   History  Substance Use Topics  . Smoking status: Current Every Day Smoker -- 0.50 packs/day for 40 years    Types: Cigarettes  . Smokeless tobacco: Not on file  . Alcohol Use: 2.0 oz/week    4 drink(s) per week   OB History   Grav Para Term Preterm Abortions TAB SAB Ect Mult Living                 Review of Systems  Constitutional: Positive for fatigue. Negative for fever.  HENT: Negative for sore throat and rhinorrhea.   Eyes: Negative for redness.  Respiratory: Negative for cough.   Cardiovascular: Negative for chest pain.  Gastrointestinal: Positive for diarrhea. Negative for nausea, vomiting, abdominal pain and blood in stool.  Genitourinary: Negative for dysuria.  Musculoskeletal: Negative for myalgias.  Skin: Negative for rash.  Neurological: Positive for weakness. Negative  for headaches.    Allergies  Aspirin  Home Medications   Current Outpatient Rx  Name  Route  Sig  Dispense  Refill  . acetaminophen (TYLENOL) 500 MG tablet   Oral   Take 1,000 mg by mouth daily as needed for pain.          Marland Kitchen BLACK COHOSH PO   Oral   Take 1 capsule by mouth 2 (two) times daily.         Marland Kitchen CALCIUM PO   Oral   Take 1 tablet by mouth daily.         . cetirizine (ZYRTEC) 10 MG tablet   Oral   Take 1 tablet (10 mg total) by mouth daily.   30 tablet   6   . fluticasone (FLONASE) 50 MCG/ACT nasal spray   Nasal   Place 2 sprays into the nose daily as needed for rhinitis.         . folic acid (FOLVITE) 1 MG tablet   Oral   Take 1 mg by mouth daily.         . hydrochlorothiazide (HYDRODIURIL) 25 MG tablet   Oral   Take 1 tablet (25 mg total) by mouth daily.   30 tablet   3   . ipratropium (ATROVENT HFA) 17 MCG/ACT inhaler   Inhalation   Inhale 2 puffs into the lungs daily as needed for wheezing.         Marland Kitchen  methotrexate (RHEUMATREX) 2.5 MG tablet   Oral   Take 10 mg by mouth 2 (two) times a week. Caution:Chemotherapy. Protect from light. Take on Monday and Tuesday only.         . metoprolol (LOPRESSOR) 50 MG tablet   Oral   Take 1 tablet (50 mg total) by mouth 2 (two) times daily.   180 tablet   3   . Multiple Vitamin (MULTIVITAMIN WITH MINERALS) TABS tablet   Oral   Take 1 tablet by mouth daily.         Marland Kitchen OLANZapine (ZYPREXA) 5 MG tablet   Oral   Take 5 mg by mouth at bedtime.         Marland Kitchen omeprazole (PRILOSEC) 40 MG capsule   Oral   Take 1 capsule (40 mg total) by mouth daily.   90 capsule   1   . predniSONE (DELTASONE) 10 MG tablet   Oral   Take 5 mg by mouth daily.         Marland Kitchen sulfaSALAzine (AZULFIDINE) 500 MG tablet   Oral   Take 1,000 mg by mouth every morning.         . verapamil (CALAN) 80 MG tablet   Oral   Take 1 tablet (80 mg total) by mouth 3 (three) times daily.   90 tablet   2    BP 135/87  Pulse  107  Temp(Src) 98.7 F (37.1 C) (Oral)  Resp 20  SpO2 99% Physical Exam  Nursing note and vitals reviewed. Constitutional: She appears well-developed and well-nourished.  HENT:  Head: Normocephalic and atraumatic.  Eyes: Conjunctivae are normal. Right eye exhibits no discharge. Left eye exhibits no discharge.  Neck: Normal range of motion. Neck supple.  Cardiovascular: Normal rate, regular rhythm and normal heart sounds.   Pulmonary/Chest: Effort normal and breath sounds normal.  Abdominal: Soft. There is no tenderness.  Neurological: She is alert.  Skin: Skin is warm and dry.  Psychiatric: She has a normal mood and affect.    ED Course   Procedures (including critical care time)  Labs Reviewed  CBC WITH DIFFERENTIAL - Abnormal; Notable for the following:    RBC 3.36 (*)    Hemoglobin 11.3 (*)    HCT 31.9 (*)    Platelets 91 (*)    All other components within normal limits  COMPREHENSIVE METABOLIC PANEL - Abnormal; Notable for the following:    Sodium 131 (*)    Potassium 2.6 (*)    Chloride 88 (*)    Glucose, Bld 120 (*)    Albumin 3.2 (*)    GFR calc non Af Amer 60 (*)    GFR calc Af Amer 70 (*)    All other components within normal limits  URINALYSIS, ROUTINE W REFLEX MICROSCOPIC - Abnormal; Notable for the following:    Leukocytes, UA TRACE (*)    All other components within normal limits  URINE MICROSCOPIC-ADD ON - Abnormal; Notable for the following:    Squamous Epithelial / LPF FEW (*)    All other components within normal limits  MAGNESIUM  GI PATHOGEN PANEL BY PCR, STOOL   No results found. 1. Dehydration   2. Hypokalemia   3. Diarrhea     2:36 PM Patient seen and examined. Work-up initiated. Medications ordered.   Vital signs reviewed and are as follows: Filed Vitals:   02/28/13 1346  BP: 135/87  Pulse: 107  Temp: 98.7 F (37.1 C)  Resp: 20   Patient  d/w and seen by Dr. Jodi Mourning. Given electrolyte abnormalities and overall appearance  outpatient, will admit to obs.   I have spoken with family practice who will see in emergency department.  MDM  Patient with hypokalemia, dehydration in setting of diarrhea for one week. Will admit.  Renne Crigler, PA-C 02/28/13 [phone]

## 2013-02-28 NOTE — ED Notes (Signed)
Pt. Also reports having diarrhea since last Wednesday

## 2013-02-28 NOTE — Telephone Encounter (Signed)
Daughter reports mother has been sick with diarrhea- unable to hold down food or drink or meds for a week - recommended that daughter take to [facility] or ED. Daughter verbalized understanding. Wyatt Haste, RN-BSN

## 2013-02-28 NOTE — H&P (Signed)
Family Medicine Teaching [facility] Admission History and Physical Service Pager: [phone]  Patient name: Jocelyn Shelton Medical record number: [id number] Date of birth: 06-04-1956 Age: 57 y.o. Gender: female  Primary Care Provider: Saralyn Pilar, DO Consultants: None Code Status: Full Code  Chief Complaint: Diarrhea and lower abdominal cramping  Assessment and Plan: Jocelyn Shelton is a 57 y.o. year old female with Rheumatoid Arthritis on methotrexate and sulfasalazine and chronic prednisone presenting with diarrhea and abdominal cramping.  PMH is significant for scleroderma.  Items currently on the differential include acute viral/bacterial gastroenteritis, Methotrexate-related GI upset, GI manifestation of scleroderma. Acute gastroenteritis is most common of the problems on the differential. Lack of blood or mucus in stool makes invasive bacterial (e.g. Shigella) or C. Difficile less likely as well as ischemic colitis (and symptoms atypical for this). Additionally, pt has had no recent illness or sick contacts and no other family members present w/ similar symptoms. Methotrexate therapy could explain loose stools, anemia, and thrombocytopenia. The same symptoms may also be a GI manifestation of scleroderma.   #Diarrhea w/ cramping -Will monitor pt status and complete tests and tasks as listed below in -supportive care with IVF and zofran and soft diet [ ]  f/u GI panel from ED  #Electrolyte imbalance [ ] Continue rehydration w/ NS at 125 cc/hr. She has received 3 doses of KCl 10 mEq/L, and 1 KCl 40 mEq tabs (with additional ordered) - Will monitor on cardiac telemetry due to low potassium levels.  -mg normal at 2.2 [ ] Recheck BMET tomorrow morning (03/01/2013) to monitor electrolyte status.  #Anemia w/ thrombocytopenia -FOBT neg. Reportedly uptodate on colonoscopy at age 57 [ ]  Hold methotrexate since this could be cause of anemia and thrombocytopenia, although  methotrexate usually causes macrocytic changes, which are not seen in this pt.  -given 5mg  folic acid daily for now as increase from 1mg  for possible toxicity though think this is unlikely [ ]  Repeat CBC tomorrow morning (03/01/2013).  # Rheumatoid arthritis Chronic steroid use at 5mg  prenisone -20mg  prednisone for now  #Hypertension -hold HCTZ for now, continue metoprolol and verapamil (also for Raynauds)  # Psych history/depression -continue zyprexa  #GERD -continue PPI  #COPD -continue prn ipratropium (mild wheeze on exam though no SOB)  #Tobacco abuse -encourage cessation -7mg  patch  FEN/GI: NS at 125 cc/hr Prophylaxis: 5000 U Heparin [place] tid  Disposition: Admit to inpatient FTPS w/ management as above, attending Drs. Sheffield Slider and [employer]. Consider PT if strength not improved with rehydration  History of Present Illness: Jocelyn Shelton is a 57 y.o. year old female presenting with diarrhea of 1 wk duration. Pt denies any specific cause for diarrhea. Began 1 wk ago after eating dinner w/ her family and having a cup of coffee. She initially thought her diarrhea was due to the coffee, however, it continued to progress. During that week she had 6-7 BMs per day. She describes these stools as mushy, normal in color, and w/o mucus, blood, or large amount of watery liquid. To treat the diarrhea, she drank Pedialyte and took Catering manager w/ little improvement. Pt notes that cold drinks and sweet things such as peaches and sweet soft drinks tend to cause/worsen diarrhea. She has also experienced a general loss of appetite and has not had much else to eat this week other than a boiled egg, apple sauce, and a couple peanut butter crackers a few days ago. Over the past two days she stopped having diarrhea and is now experiencing  urgency w/o BM 1-2x/day accompanied by dull cramping for 5 minutes in her lower abdomen radiating to her back.   She denies fever, N/V, and stomach pain.  Endorses feeling  hot and sweaty (both during and after defecation), chills, light-headedness and dizziness on standing/walking. She has not had any LOC.  Review Of Systems: Per HPI with the following additions:  Pulm: Nonproductive cough began yesterday.   GU: No dysuria, polyuria, or hematuria.  Otherwise 12 point review of systems was performed and was unremarkable.  Patient Active Problem List   Diagnosis Date Noted  . Claudication 11/07/2012  . Hypertension, poor control 09/29/2012  . Sinusitis 09/28/2012  . Facial rash 02/02/2012  . 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 Erythema nodosum 04/27/2011  . Lung mass 04/27/2011  . GASTROESOPHAGEAL REFLUX DISEASE 08/02/2008  . CORNS AND CALLOSITIES 03/18/2007  . DEPRESSION, MAJOR, RECURRENT 09/23/2006  . Smoking trying to quit 09/23/2006  . HYPERTENSION, BENIGN SYSTEMIC 09/23/2006  . RAYNAUDS SYNDROME 09/23/2006  . CLAUDICATION, INTERMITTENT 09/23/2006  . OSTEOARTHRITIS, MULTI SITES 09/23/2006   Past Medical History: Additional PMH: Pt has not experienced diarrhea like this in the past.  Past Medical History  Diagnosis Date  . Hypertension   . Reflux   . Scleroderma   . Rheumatoid arthritis(714.0)    Past Surgical History: Past Surgical History  Procedure Laterality Date  . Appendectomy  90's   Social History: History  Substance Use Topics  . Smoking status: Current Every Day Smoker -- 0.50 packs/day for 40 years    Types: Cigarettes  . Smokeless tobacco: Not on file  . Alcohol Use: 2.0 oz/week    4 drink(s) per week   Additional social history: Drinks 1-2 beer bottles/wk. Has smoked 5 cigarettes total this week due to being sick. No illicit drug use. Stays at home with daughter, grandson and family friend. No sick contacts and no one else in the family had similar symptoms in the past month. Everyone in the household has been eating the same foods w/ no one experiencing similar diarrheal symptoms. Pt denies outdoor exposure, and she drinks  bottled and municipal water.  Please also refer to relevant sections of EMR.  Family History: Family History  Problem Relation Age of Onset  . Hypertension Father   . Heart attack Mother   . Diabetes Daughter   . Diabetes Sister   . Lung cancer Brother 50   Allergies and Medications: Allergies  Allergen Reactions  . Aspirin Other (See Comments)    Stomach cramps   No current facility-administered medications on file prior to encounter.   Current Outpatient Prescriptions on File Prior to Encounter  Medication Sig Dispense Refill  . acetaminophen (TYLENOL) 500 MG tablet Take 1,000 mg by mouth daily as needed for pain.       Marland Kitchen BLACK COHOSH PO Take 1 capsule by mouth 2 (two) times daily.      . cetirizine (ZYRTEC) 10 MG tablet Take 1 tablet (10 mg total) by mouth daily.  30 tablet  6  . folic acid (FOLVITE) 1 MG tablet Take 1 mg by mouth daily.      . hydrochlorothiazide (HYDRODIURIL) 25 MG tablet Take 1 tablet (25 mg total) by mouth daily.  30 tablet  3  . metoprolol (LOPRESSOR) 50 MG tablet Take 1 tablet (50 mg total) by mouth 2 (two) times daily.  180 tablet  3  . omeprazole (PRILOSEC) 40 MG capsule Take 1 capsule (40 mg total) by mouth daily.  90 capsule  1  . verapamil (CALAN) 80 MG tablet Take 1 tablet (80 mg total) by mouth 3 (three) times daily.  90 tablet  2    Objective: BP 120/80  Pulse 107  Temp(Src) 98.7 F (37.1 C) (Oral)  Resp 18  SpO2 100% Exam: General: NAD. Pt lying supine in slight right lateral recumbent position and appears fatigued.   HEENT: NCAT. Slight conjunctival pallor. No scleral icterus. No cervical, occipital, pre/postauricular, supraclavicular lymphadenopathy. Neck supple. No thyromegaly. Dry mucus membranes. Cardiovascular: Normal S1 and S2. No m/r/g. Radial and dorsalis pedis pulses 2+ bilaterally. Normal cap refill. No peripheral edema. Respiratory: Normal respiratory effort. Equal expansion bilaterally. Inspiratory wheeze present in upper and  lower lung lobes bilaterally.  Abdomen: +BS. Non-distended abdomen. Pain to deep palpation diffusely w/ pain greatest in RUQ>LUQ>LLQ. No endorsed pain outside of palpation. No rebound or guarding. No organomegaly. No CVA tenderness. Extremities: Strength 5/5 in both upper and lower extremities bilaterally. Minor pain to palpation of lower extremities.  Skin: No rashes, lesions, or erythema. Abnormal skin turgor. Neuro: A&Ox3. EOMI.   Labs and Imaging: CBC BMET   Recent Labs Lab 02/28/13 1450  WBC 8.0  HGB 11.3*  HCT 31.9*  PLT 91*    Recent Labs Lab 02/28/13 1450  NA 131*  K 2.6*  CL 88*  CO2 31  BUN 14  CREATININE 1.01  GLUCOSE 120*  CALCIUM 8.7     Problem List: 1. Diarrhea w/ cramping 2. Electrolyte imbalance - hypoK, hypoNa, hypoCl 3. Anemia w/ thrombocytopenia 4. Diet - not currently eating  Donnal Moat, Med Student 02/28/2013, 6:19 PM [employer] MS-3, [place] [facility] FPTS Intern pager: [phone], text pages welcome  Family Medicine Upper Level Addendum:   I have seen and examined the patient independently, discussed with Student Doctor Hyacinth Meeker, fully reviewed the H+P and agree with it's contents with the additions as noted in blue text. My independent exam is below.   S: diarrhea x 5 days stopping 2 days ago now with mild cramping. Weakness and poor PO main symptoms today with orthostatic history over last few days. No nausea/vomiting/abdominal pain. Subjective chills.   O: BP 120/80  Pulse 107  Temp(Src) 98.7 F (37.1 C) (Oral)  Resp 18  Ht 5\' 6"  (1.676 m)  Wt 166 lb 14.2 oz (75.7 kg)  BMI 26.95 kg/m2  SpO2 100% Gen: NAD, appears weak but able to sit up in bed HEENT: dry mucus membranes, PERRLA  CV: RRR no mrg  Lungs: CTAB  Abd: soft/nondistended/normal bowel sounds. Slight tenderness to deep palpation throughotu abdomen.  Rectal: no stool burden. FOBT obtained.  MSK: moves all extremities, no edema  Skin: warm and dry, no rash, poor skin  turgor Neuro: grossly normal, moves all extremities.   A/P:   57 year old female on immunosuppresants (prednisone and methotrexate) presenting with diarrhea and abdominal cramping found to have hypokalemia to 2.6, dehydration, new anemia and thrombocytopenia. Concern for methotrexate toxicity vs GI illness.  -supportive care -replete potassium and fluid losses. Follow up BMET. Mech soft diet advance as tolerated.  -GI panel per ED -hold methotrexate -increase folic acid -monitor CBC (anemia and thrombocytopenia) -increase steroids 4 fold and monitor BP, hold HCTZ  Tana Conch, MD, PGY3 02/28/2013 8:23 PM

## 2013-02-28 NOTE — ED Notes (Signed)
Pt. oob to the bathroom,, gait steady , pt. Was assisted by her daughter.

## 2013-03-01 DIAGNOSIS — D61818 Other pancytopenia: Secondary | ICD-10-CM

## 2013-03-01 DIAGNOSIS — I10 Essential (primary) hypertension: Secondary | ICD-10-CM

## 2013-03-01 DIAGNOSIS — D649 Anemia, unspecified: Secondary | ICD-10-CM

## 2013-03-01 DIAGNOSIS — D696 Thrombocytopenia, unspecified: Secondary | ICD-10-CM | POA: Diagnosis present

## 2013-03-01 LAB — PROTIME-INR: INR: 1.09 (ref 0.00–1.49)

## 2013-03-01 LAB — BASIC METABOLIC PANEL
CO2: 25 mEq/L (ref 19–32)
Calcium: 7.9 mg/dL — ABNORMAL LOW (ref 8.4–10.5)
Chloride: 106 mEq/L (ref 96–112)
GFR calc Af Amer: 83 mL/min — ABNORMAL LOW (ref >90)
GFR calc non Af Amer: 67 mL/min — ABNORMAL LOW (ref >90)
Potassium: 4.8 mEq/L (ref 3.5–5.1)
Sodium: 137 mEq/L (ref 135–145)

## 2013-03-01 LAB — CBC
HCT: 28 % — ABNORMAL LOW (ref 36.0–46.0)
Hemoglobin: 9.7 g/dL — ABNORMAL LOW (ref 12.0–15.0)
MCV: 95.8 fL (ref 78.0–100.0)
Platelets: 74 10*3/uL — ABNORMAL LOW (ref 150–400)
RBC: 2.65 MIL/uL — ABNORMAL LOW (ref 3.87–5.11)
RBC: 2.9 MIL/uL — ABNORMAL LOW (ref 3.87–5.11)
WBC: 4.6 10*3/uL (ref 4.0–10.5)
WBC: 5.8 10*3/uL (ref 4.0–10.5)

## 2013-03-01 LAB — GI PATHOGEN PANEL BY PCR, STOOL
C difficile toxin A/B: NEGATIVE
G lamblia by PCR: NEGATIVE
Rotavirus A by PCR: NEGATIVE
Salmonella by PCR: NEGATIVE

## 2013-03-01 MED ORDER — PREDNISONE 20 MG PO TABS
20.0000 mg | ORAL_TABLET | Freq: Two times a day (BID) | ORAL | Status: DC
  Administered 2013-03-01 – 2013-03-02 (×2): 20 mg via ORAL
  Filled 2013-03-01 (×4): qty 1

## 2013-03-01 MED ORDER — PREDNISONE 5 MG PO TABS: 5.0000 mg | ORAL_TABLET | Freq: Every day | ORAL | Status: DC

## 2013-03-01 MED ORDER — SODIUM CHLORIDE 0.9 % IV SOLN
INTRAVENOUS | Status: DC
  Administered 2013-03-01: 10:00:00 via INTRAVENOUS

## 2013-03-01 NOTE — Discharge Summary (Signed)
Family Medicine Teaching [facility] Discharge Summary  Patient name: Jocelyn Shelton Medical record number: [id number] Date of birth: 01/25/1956 Age: 57 y.o. Gender: female Date of Admission: 02/28/2013  Date of Discharge: 03/02/2013 Admitting Physician: Zachery Dauer, MD  Primary Care Provider: Saralyn Pilar, DO Consultants: Rheumatology - Dr. Kellie Simmering (phone)  Indication for Hospitalization: Dehydration and Electrolyte imbalance, secondary to recurrent diarrhea, concern for methotrexate toxicity  Discharge Diagnoses/Problem List: Dehydration - resolved Diarrhea - resolved Hypokalemia - resolved Hyponatremia - resolved Hypochloremia - resolved Anemia, Normocytic Thrombocytopenia Rheumatoid Arthritis Possible Methotrexate Toxicity Hypertension - stable Depression - stable COPD - stable Tobacco Abuse  Disposition: Home   Discharge Condition: Improved, Stable  Brief Hospital Course:  Jocelyn Shelton is a 57 y.o. year old female with Rheumatoid Arthritis on methotrexate, sulfasalazine, chronic prednisone presenting with generalized weakness from diarrhea and abdominal cramping, for past 7 days, concern for Methotrexate toxicity, acute viral/bacterial gastroenteritis. PMH is significant for HTN and scleroderma (1980s, reportedly in remission now).  Diarrhea w/ abdominal cramping Presented with approximately 7 day history of diarrhea without blood, and episodes of abdominal cramping. She has been feeling weak and fatigued, mainly limited to bed rest during the past week, with a poor PO intake. Reported no sick contacts. In ED, FOBT was negative, rehydrated with IVF. During hospitalization, significantly reduced diarrhea, with only one small amt of diarrhea in the first 24 hours, minimal abdominal cramping with attempted BM. Rehydration and increased PO intake significantly improved her condition within first 24 hours. GI pathogen panel was negative. Consulted Dr. Kellie Simmering  (patient's Rheumatologist) by phone, and believed that diarrhea was likely viral and less likely secondary to Methotrexate, but did not rule this out. Monitored diarrhea, abdominal cramping, and PO intake with continued improvement and no further complications.  Electrolyte imbalance, Hypokalemia, Hyponatremia, Hypochloremia See above description for details on presentation. Initial blood work in ED showed K (2.6), Na (131), Cl (88), with normal Mg (2.2). Received IVF rehydration at 125cc/hr, and several doses of K+ repletion. Placed on telemetry to monitor for arrhythmias possibly triggered by hypokalemia, and no events were recorded. BMET in AM showed resolution of all electrolyte abnormalities with K (4.7), Na (137), and Cl (105), which have remained stable during hospitalization, and due to improved PO intake and significantly reduced diarrhea, IVFs were reduced.  Anemia, normocytic Initial work-up on arrival showed normocytic anemia with Hgb (11.3) and MCV (94.9). Patient's baseline Hgb 13-14 (past 2 years). Considered to be contributing to patient's generalized weakness. Reported hx of colonoscopy at age 67. FOBT negative, unlikely to be losing blood in diarrhea. Following rehydration, CBC in AM showed decreased Hgb (8.7), likely attributed to dilutional effects. Subsequent Hgbs have increased (9.7, 9.1), remaining lower than pts baseline, but above transfusion threshold. Considered to be related to possible bone marrow suppression, however MTX toxicity generally is macrocytic.  Thrombocytopenia Initial work-up on arrival showed thrombocytopenia with Platelets (91). Baseline Plt 170s with high 281, within past 2 years. No active signs of bleeding. Initially suspicious of Methotrexate toxicity induced thrombocytopenia, held MTX on arrival. Due to evidence of possible pancytopenia, concern for bone marrow suppression secondary to either viral etiology and/or medications (MTX and Sulfasalazine).  Proceeded to hold both MTX and Sulfasalazine. 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 Continued monitoring of CBC showed further decrease in platelets (74 -->71-->62). With active bleeding, and holding potential drugs that would contribute to thrombocytopenia, pt was discharged with close follow-up.  Rheumatoid arthritis  Managed by Dr. Kellie Simmering (Rheumatology), presented on current regimen of: Methotrexate 10mg  Monday / Tuesday (20mg  weekly), Sulfasalazine 1000mg  daily, chronic prednisone 5mg  daily. On presentation exam of hands and ankles suggestive of active RA with boggy, tender, edematous findings. Due to concern for medication toxicity and bone marrow suppression, initially held MTX and Sulfasalazine. Dr. Kellie Simmering consulted via phone, agrees about possible methotrexate toxicity. Recommended to continue holding MTX and sulfasalazine. Increase prednisone to 20mg  BID for 3-4 days then taper down to home dose of 5mg  gradually. Plan to stop Rheum meds (MTX, sulfasalazine) until follow-up appointment with him after discharge.  Hypertension Stable on arrival. Initially held HCTZ to reduce fluid and K+ loss. Continued other home BP meds (Metoprolol and Verapamil). Continued to monitor BP during hospitalization.  COPD Stable. Patient without shortness of breath or worsening cough. Scattered wheezes noted on exam.  Depression Stable. Continue zyprexa 5mg  daily.    GERD  Stable. Continued on PPI.   Tobacco abuse Placed 7mg  nicotine patch. Encouraged cessation and to follow-up on discharge.  Issues for Follow Up:  Management of Rheumatoid Arthritis - Follow-up with Dr. Kellie Simmering to determine appropriate course for resuming therapy, currently advised to hold MTX, Sulfasalazine, and continue Prednisone 5mg  daily  Anemia with Thrombocytopenia - Follow-up CBC monitoring outpatient to determine if remains stable  Significant Procedures: None  Significant Labs and  Imaging:   Recent Labs Lab 03/01/13 0500 03/01/13 1443 03/02/13 0543  WBC 4.6 5.8 6.2  HGB 8.7* 9.7* 9.1*  HCT 25.4* 28.0* 26.7*  PLT 74* 71* 62*    Recent Labs Lab 02/28/13 1450 03/01/13 0500 03/01/13 1443 03/02/13 0543  NA 131* 137 136 138  K 2.6* 4.7 4.8 4.8  CL 88* 105 106 105  CO2 31 25 23 24   GLUCOSE 120* 122* 132* 138*  BUN 14 10 11 12   CREATININE 1.01 0.92 0.87 0.84  CALCIUM 8.7 7.9* 8.4 8.6  MG 2.2  --   --   --   ALKPHOS 104  --   --   --   AST 17  --   --   --   ALT 19  --   --   --   ALBUMIN 3.2*  --   --   --    8/5 UA Negative  8/5 FOBT Negative  8/5 GI Pathogen Panel All negative (including C. Diff, E. Coli, Salmonella, Shigella, Norovirus, Rotavirus, Giardia, Campylobacter)  8/6 PT/INR - 13.9 / 1.09  Outstanding Results: None  Discharge Medications:    Medication List    STOP taking these medications       methotrexate 2.5 MG tablet  Commonly known as:  RHEUMATREX     sulfaSALAzine 500 MG tablet  Commonly known as:  AZULFIDINE      TAKE these medications       acetaminophen 500 MG tablet  Commonly known as:  TYLENOL  Take 1,000 mg by mouth daily as needed for pain.     BLACK COHOSH PO  Take 1 capsule by mouth 2 (two) times daily.     CALCIUM PO  Take 1 tablet by mouth daily.     cetirizine 10 MG tablet  Commonly known as:  ZYRTEC  Take 1 tablet (10 mg total) by mouth daily.     fluticasone 50 MCG/ACT nasal spray  Commonly known as:  FLONASE  Place 2 sprays into the nose daily as needed for rhinitis.     folic acid 1 MG tablet  Commonly known as:  FOLVITE  Take 1 mg by mouth daily.     hydrochlorothiazide 25 MG tablet  Commonly known as:  HYDRODIURIL  Take 1 tablet (25 mg total) by mouth daily.     ipratropium 17 MCG/ACT inhaler  Commonly known as:  ATROVENT HFA  Inhale 2 puffs into the lungs daily as needed for wheezing.     metoprolol 50 MG tablet  Commonly known as:  LOPRESSOR  Take 1 tablet (50 mg total)  by mouth 2 (two) times daily.     multivitamin with minerals Tabs tablet  Take 1 tablet by mouth daily.     OLANZapine 5 MG tablet  Commonly known as:  ZYPREXA  Take 5 mg by mouth at bedtime.     omeprazole 40 MG capsule  Commonly known as:  PRILOSEC  Take 1 capsule (40 mg total) by mouth daily.     predniSONE 10 MG tablet  Commonly known as:  DELTASONE  2 tabs twice daily for 4 days, 2 tabs in AM then 1 in PM for 4 days, 1 tab twice daily for 4 days, 1 tab daily for 4 days, then 5mg  daily     verapamil 80 MG tablet  Commonly known as:  CALAN  Take 1 tablet (80 mg total) by mouth 3 (three) times daily.        Discharge Instructions: Please refer to Patient Instructions section of EMR for full details.  Patient was counseled important signs and symptoms that should prompt return to medical care, changes in medications, dietary instructions, activity restrictions, and follow up appointments.   Follow-Up Appointments: Follow-up Information   Follow up with Maryjean Ka, MD On 03/10/2013. (10:15 AM)    Contact information:   [address] [place] [address] [phone]      Rheumatologist - Dr. Kellie Simmering - currently already had follow-up apt scheduled. Pt to call and arrange to make it sooner.   Saralyn Pilar, DO 03/03/2013, 11:34 PM PGY-1, [facility] Family [facility]

## 2013-03-01 NOTE — Progress Notes (Signed)
Family Medicine Teaching Service Daily Progress Note Intern Pager: [phone]  Patient name: Jocelyn Shelton Medical record number: [id number] Date of birth: Jun 15, 1956 Age: 57 y.o. Gender: female  Primary Care Provider: Saralyn Pilar, DO Consultants: none Code Status: FULL  Pt Overview and Major Events to Date:  8/6 K+ 4.7 (2.6), Cl- 105 (88), Hgb 8.7 (11.3), Plt 74 (91), only 1x diarrhea  Assessment and Plan:  Jocelyn Shelton is a 57 y.o. year old female with Rheumatoid Arthritis on methotrexate and sulfasalazine and chronic prednisone presenting with diarrhea and abdominal cramping. PMH is significant for scleroderma (1980s, in remission now). Items currently on the differential include acute viral/bacterial gastroenteritis, Methotrexate-related GI upset, GI manifestation of scleroderma. Acute gastroenteritis is most common of the problems on the differential. Lack of blood or mucus in stool makes invasive bacterial (e.g. Shigella) or C. Difficile less likely as well as ischemic colitis (and symptoms atypical for this). Additionally, pt has had no recent illness or sick contacts and no other family members present w/ similar symptoms. Methotrexate therapy could explain loose stools, anemia, and thrombocytopenia. The same symptoms may also be a GI manifestation of scleroderma.  #Diarrhea w/ cramping  - improved today, only 1x small amt diarrhea w/o blood. No abd cramping or pain - continue to monitor - supportive care, IVF, zofran, soft diet advance as tolerated - GI Panel: alk phos 104, alb 3.2, AST 17, ALT 19, T-protein 6.8, T-Bili 0.5 - FOBT negative [ ]  f/u GI pathogen panel - pending  #Electrolyte imbalance - Hypokalemia - resolved. 4.7 (2.6) - good PO intake, will remove additional K+ from IVF - Hyponatremia - resolved. 137 (131) - continue IVF - Hypochloremia - resolved 105 (88) - likely due to diarrhea - Mg normal at 2.2 [ ]  f/u BMET in AM [ ] Continue rehydration w/ NS  at 125 cc/hr. She has received 3 doses of KCl 10 mEq/L, and 1 KCl 40 mEq tabs (with additional ordered)  - Will monitor on cardiac telemetry due to low potassium levels.   #Anemia w/ thrombocytopenia  -FOBT neg. Reportedly uptodate on colonoscopy at age 57 [ ]  Hold methotrexate since this could be cause of anemia and thrombocytopenia, although methotrexate usually causes macrocytic changes, which are not seen in this pt.  -given 5mg  folic acid daily for now as increase from 1mg  for possible toxicity though think this is unlikely  - Thrombocytopenia - 74 (91) continues to decrease. No active signs of bleeding, no blood in stool. - CBC (8/6) shows decrease in all cell lines and drop H/H, consider Bone Marrow Suppression? Secondary to methotrexate toxicity? [ ]  f/u repeat CBC  # Rheumatoid arthritis  Chronic steroid use at 5mg  prenisone, Methotrexate 10mg  Monday / Tuesday (20mg  weekly), and Sulfasalazine 1000mg  daily. Followed by Dr. Kellie Simmering (Rheum) - continue 20mg  prednisone today; increased prednisone by 4x for stress dose. Plan to initiate taper back to normal dose pending improvement in symptoms  #Hypertension  -hold HCTZ for now, continue metoprolol and verapamil (also for Raynauds) - BP stable 140s/80s, continue to monitor   # Psych history/depression  -continue zyprexa   #GERD  -continue PPI   #COPD  Continued scattered wheezing on exam. Denies SOB) - continue prn ipratropium 2 puff q 4 hr PRN  #Tobacco abuse  -encourage cessation  -7mg  patch  FEN/GI: NS at 125 cc/hr (removed 64mEq/L of KCl due to corrected electrolytes) Prophylaxis: 5000 U Heparin [place] tid  Disposition: Home pending clinical improvement  Subjective: O/N -  no reported events Today patient reports feeling stronger, less weakness, less fatigue. Her stomach was "growling" and she her appetite has returned. She ate a good amount of her breakfast and tolerated it well. Reports only 1x episode of small amt  of diarrhea w/o blood. No nausea, vomiting, and denies any abd pain or cramping. She is able to get up and walk to bathroom, feels steady, and overall much better than the previous week where she was mainly limited to staying in bed for 7-8 days with decreased PO intake.  Denies CP, SoB, cough, HA, worsening weakness/fatigue, nausea/vomiting, abd pain/cramping.  Objective: Temp:  [98.6 F (37 C)-99.4 F (37.4 C)] 98.6 F (37 C) (08/06 0528) Pulse Rate:  [95-107] 99 (08/06 0528) Resp:  [18-20] 18 (08/06 0528) BP: (110-148)/(56-123) 117/72 mmHg (08/06 0528) SpO2:  [97 %-100 %] 99 % (08/06 0528) Weight:  [166 lb 14.2 oz (75.7 kg)] 166 lb 14.2 oz (75.7 kg) (08/05 1946) Physical Exam: General: pleasant, sitting at side of bed with breakfast tray, WDWN, NAD HEENT: PERRLA, EOMI, MMM Cardiovascular: RRR, no murmurs Respiratory: +diffuse mild-mod wheezing in bilateral lung fields, w/o increased resp effort Abdomen: soft, NT/ND, +BS in all 4 quads Extremities: b/l fingers mildly edematous w/o erythema or tenderness. No nodules or enlarged joints noted. moves all ext, peripheral pulses 2+ in all ext Neuro: grossly non-focal, muscle str 5/5 in all 4 ext, intact distal sensation ext  Laboratory:  Recent Labs Lab 02/28/13 1450 03/01/13 0500  WBC 8.0 4.6  HGB 11.3* 8.7*  HCT 31.9* 25.4*  PLT 91* 74*    Recent Labs Lab 02/28/13 1450 03/01/13 0500  NA 131* 137  K 2.6* 4.7  CL 88* 105  CO2 31 25  BUN 14 10  CREATININE 1.01 0.92  CALCIUM 8.7 7.9*  PROT 6.8  --   BILITOT 0.5  --   ALKPHOS 104  --   ALT 19  --   AST 17  --   GLUCOSE 120* 122*   8/5 UA Negative  8/5 GI Pathogen Panel Pending  8/5 FOBT Negative   Imaging/Diagnostic Tests:  None  Saralyn Pilar, DO 03/01/2013, 8:29 AM PGY-1, [facility] FPTS Intern pager: [phone], text pages welcome

## 2013-03-01 NOTE — H&P (Signed)
I examined this patient and discussed the care plan with Dr Durene Cal and the [facility] team and agree with assessment and plan as documented in the admission note above. She is also at risk from immuno- and bone marrow suppression from Sulfasalazine. We should discuss any transitions of her RA medications with Dr Kellie Simmering, her rheumatologist. She has boggy tender synovial tissue at both her wrists, MP joints, and ankles that implies that her RA is currently active.

## 2013-03-01 NOTE — Progress Notes (Signed)
I examined this patient and discussed the care plan with Dr Karamalegos and the FPTS team and agree with assessment and plan as documented in the progress note above.  

## 2013-03-01 NOTE — Progress Notes (Signed)
PGY-2 Update Note  Spoke with Dr. Kellie Simmering, pt's rheumatologist. He agrees that methotrexate toxicity is a possibility, but favors that current GI symptoms may be due to a viral process and her anemia/thrombocytopenia could represent viral marrow suppression possibly with component of suppression from rheum meds (sulfasalazine and MTX); her anemia and thrombocytopenia as well as decreased albumin are a marked change from the recent values he has from the outpt setting.  Dr. Ines Bloomer recommendations are to hold MTX and sulfasalazine (holding currently) and to increase pt's prednisone to 20 mg BID for 3-4 days, then taper back down to home dose of 5 mg gradually. He states pt should have an appt with him in September but she should be instructed to call his office and that he should be able to work her in sooner, if needed. Otherwise, he recommends pt to stop other rheum meds until f/u with him and he states he is available for consultation by phone throughout the remainder of pt's stay. FPTS greatly appreciates his assistance.  Bobbye Morton, MD PGY-2, [facility] Medicine 03/01/2013, 5:33 PM FPTS Service pager: [phone] (text pages welcome through [facility])

## 2013-03-01 NOTE — Progress Notes (Signed)
Utilization review completed.  

## 2013-03-01 NOTE — ED Provider Notes (Signed)
Medical screening examination/treatment/procedure(s) were conducted as a shared visit with non-physician practitioner(s) or resident  and myself.  I personally evaluated the patient during the encounter and agree with the findings and plan unless otherwise indicated.  Recurrent diarrhea.  No abd pain on exam.  No recent abx use.  General weakness and dehydration on exam.  Hypokalemia.  Observation placed.  Fluids and K given.    Enid Skeens, MD 03/01/13 [phone]

## 2013-03-02 DIAGNOSIS — D696 Thrombocytopenia, unspecified: Secondary | ICD-10-CM

## 2013-03-02 DIAGNOSIS — D649 Anemia, unspecified: Secondary | ICD-10-CM

## 2013-03-02 LAB — BASIC METABOLIC PANEL
CO2: 24 mEq/L (ref 19–32)
Calcium: 8.6 mg/dL (ref 8.4–10.5)
Creatinine, Ser: 0.84 mg/dL (ref 0.50–1.10)
GFR calc non Af Amer: 75 mL/min — ABNORMAL LOW (ref >90)
Glucose, Bld: 138 mg/dL — ABNORMAL HIGH (ref 70–99)

## 2013-03-02 LAB — CBC
Hemoglobin: 9.1 g/dL — ABNORMAL LOW (ref 12.0–15.0)
MCH: 33.1 pg (ref 26.0–34.0)
MCHC: 34.1 g/dL (ref 30.0–36.0)
MCV: 97.1 fL (ref 78.0–100.0)
Platelets: 62 10*3/uL — ABNORMAL LOW (ref 150–400)
RBC: 2.75 MIL/uL — ABNORMAL LOW (ref 3.87–5.11)

## 2013-03-02 MED ORDER — PREDNISONE 10 MG PO TABS: ORAL_TABLET | ORAL | Status: DC

## 2013-03-02 MED ORDER — HYDROCHLOROTHIAZIDE 25 MG PO TABS: 25.0000 mg | ORAL_TABLET | Freq: Every day | ORAL | Status: DC

## 2013-03-02 NOTE — Progress Notes (Signed)
Patient discharged to home. Patient AVS reviewed. Patient verbalized understanding of medications and follow-up appointments.  Patient remains stable; no signs or symptoms of distress.  Patient educated to return to the ER in cases of SOB, dizziness, fever, chest pain, or fainting.  

## 2013-03-02 NOTE — Progress Notes (Signed)
Attending Addendum  I examined the patient and discussed the assessment and plan with Dr. Leonie Douglas. I have reviewed the not and agree with above.   [name]

## 2013-03-02 NOTE — Progress Notes (Signed)
Family Medicine Teaching Service Daily Progress Note Intern Pager: [phone]  Patient name: Jocelyn Shelton Medical record number: [id number] Date of birth: Sep 03, 1955 Age: 57 y.o. Gender: female  Primary Care Provider: Saralyn Pilar, DO Consultants: none Code Status: FULL  Pt Overview and Major Events to Date:  8/6 K+ 4.7 (2.6), Cl- 105 (88), Hgb 8.7 (11.3), Plt 74 (91), only 1x diarrhea  Assessment and Plan: Jocelyn Shelton is a 57 y.o. year old female with Rheumatoid Arthritis on methotrexate and sulfasalazine and chronic prednisone presenting with diarrhea and abdominal cramping. PMH is significant for scleroderma (1980s, in remission now). Items currently on the differential include acute viral/bacterial gastroenteritis, Methotrexate-related GI upset, GI manifestation of scleroderma. Acute gastroenteritis is most common of the problems on the differential. Lack of blood or mucus in stool makes invasive bacterial (e.g. Shigella) or C. Difficile less likely as well as ischemic colitis (and symptoms atypical for this). Additionally, pt has had no recent illness or sick contacts and no other family members present w/ similar symptoms. Methotrexate therapy could explain loose stools, anemia, and thrombocytopenia. The same symptoms may also be a GI manifestation of scleroderma.  #Diarrhea w/ cramping - improved. No abd cramping or pain - continue to monitor - supportive care, IVF, zofran, soft diet advance as tolerated - GI Panel: alk phos 104, alb 3.2, AST 17, ALT 19, T-protein 6.8, T-Bili 0.5 - FOBT negative - GI pathogen panel - all negative  #Electrolyte imbalance - Hypokalemia - resolved. 4.8 (2.6) - good PO intake - Hyponatremia - resolved. 138 (131) - Hypochloremia - resolved 105 (88) - likely due to diarrhea - Mg normal at 2.2 - Will monitor on cardiac telemetry due to history of low potassium levels. [ ]  f/u BMET in AM  #Anemia w/ thrombocytopenia  -FOBT neg.  Reportedly uptodate on colonoscopy at age 72 [ ]  Hold methotrexate since this could be cause of anemia and thrombocytopenia, although methotrexate usually causes macrocytic changes, which are not seen in this pt. Will also hold sulfasalazine per rheumatology -given 5mg  folic acid daily for now as increase from 1mg  for possible toxicity though think this is unlikely  - Thrombocytopenia - 62 (91) continues to decrease. No active signs of bleeding, no blood in stool. - CBC (8/6) shows decrease in all cell lines and drop H/H, consider Bone Marrow Suppression? Secondary to methotrexate toxicity? [ ]  f/u repeat CBC  # Rheumatoid arthritis  Chronic steroid use at 5mg  prenisone, Methotrexate 10mg  Monday / Tuesday (20mg  weekly), and Sulfasalazine 1000mg  daily. Followed by Dr. Kellie Simmering (Rheum) - continue 20mg  prednisone today; increased prednisone by 4x for stress dose. Will taper down to home dose over 2-3 weeks.  #Hypertension  -hold HCTZ for now, continue metoprolol and verapamil (also for Raynauds) - BP stable 120s-140s/70s-90s, continue to monitor   # Psych history/depression  -continue zyprexa   #GERD  -continue PPI   #COPD  Continued scattered wheezing on exam. Denies SOB - continue prn ipratropium 2 puff q 4 hr PRN  #Tobacco abuse  -encourage cessation  -7mg  patch  FEN/GI: Dysphagia 3 diet Prophylaxis: 5000 U Heparin [place] tid  Disposition: Home today  Subjective: Feels fine today. No complaints overnight. Feels ready to go home. No shortness of breath, nausea/vomiting, had one loose stool overnight with no gross blood or melena. No worsening weakness or fatigue.  Objective: Temp:  [98.1 F (36.7 C)-99.4 F (37.4 C)] 99.4 F (37.4 C) (08/07 1000) Pulse Rate:  [95-103] 96 (08/07 1000) Resp:  [  18-20] 18 (08/07 1000) BP: (128-157)/(74-99) 142/88 mmHg (08/07 1000) SpO2:  [96 %-98 %] 96 % (08/07 1000) Weight:  [167 lb 3.2 oz (75.841 kg)] 167 lb 3.2 oz (75.841 kg) (08/06  2158)  Physical Exam: General: pleasant, laying in bed in no acute distress Cardiovascular: RRR, no murmurs Respiratory: +diffuse mod wheezing in bilateral lung fields, w/o increased resp effort, mild rales on left lung Abdomen: soft, NT/ND, +BS in all 4 quads Extremities: moves all ext, peripheral pulses 2+ in all ext. Extreme tenderness in legs bilaterally, which is baseline Neuro: grossly non-focal, intact distal sensation ext  Laboratory:  Recent Labs Lab 03/01/13 0500 03/01/13 1443 03/02/13 0543  WBC 4.6 5.8 6.2  HGB 8.7* 9.7* 9.1*  HCT 25.4* 28.0* 26.7*  PLT 74* 71* 62*    Recent Labs Lab 02/28/13 1450 03/01/13 0500 03/01/13 1443 03/02/13 0543  NA 131* 137 136 138  K 2.6* 4.7 4.8 4.8  CL 88* 105 106 105  CO2 31 25 23 24   BUN 14 10 11 12   CREATININE 1.01 0.92 0.87 0.84  CALCIUM 8.7 7.9* 8.4 8.6  PROT 6.8  --   --   --   BILITOT 0.5  --   --   --   ALKPHOS 104  --   --   --   ALT 19  --   --   --   AST 17  --   --   --   GLUCOSE 120* 122* 132* 138*   8/5 UA Negative  8/5 GI Pathogen Panel Pending  8/5 FOBT Negative   Jacquelin Hawking, MD 03/02/2013, 1:05 PM PGY-1, [facility] Medicine FPTS Intern pager: [phone], text pages welcome

## 2013-03-03 ENCOUNTER — Ambulatory Visit: Payer: Medicaid Other | Admitting: Pharmacist

## 2013-03-04 NOTE — Discharge Summary (Signed)
I discussed the care plan with Dr Althea Charon and the [facility] team and agree with assessment and plan as documented in the discharge note above.

## 2013-03-10 ENCOUNTER — Encounter: Payer: Self-pay | Admitting: Family Medicine

## 2013-03-10 ENCOUNTER — Ambulatory Visit (INDEPENDENT_AMBULATORY_CARE_PROVIDER_SITE_OTHER): Payer: Medicaid Other | Admitting: Family Medicine

## 2013-03-10 VITALS — BP 151/94 | HR 71 | Temp 98.1°F | Ht 66.0 in | Wt 167.0 lb

## 2013-03-10 DIAGNOSIS — D696 Thrombocytopenia, unspecified: Secondary | ICD-10-CM

## 2013-03-10 DIAGNOSIS — L84 Corns and callosities: Secondary | ICD-10-CM

## 2013-03-10 DIAGNOSIS — D649 Anemia, unspecified: Secondary | ICD-10-CM

## 2013-03-10 DIAGNOSIS — M069 Rheumatoid arthritis, unspecified: Secondary | ICD-10-CM

## 2013-03-10 DIAGNOSIS — I10 Essential (primary) hypertension: Secondary | ICD-10-CM

## 2013-03-10 MED ORDER — PREDNISONE 5 MG PO TABS: 5.0000 mg | ORAL_TABLET | Freq: Every day | ORAL | Status: DC

## 2013-03-10 NOTE — Patient Instructions (Signed)
Thank you for coming in, today! I'm glad you're feeling better! We will check your blood today. I will call you or write you a letter to let you know what the results are. Keep taking all of your medications. Restart the HCTZ. DO NOT take methotrexate or sulfasalazine until you talk to Dr. Kellie Simmering. Call Dr. Kellie Simmering to set up a follow-up appointment with him. He will adjust your medications as he needs. Keep tapering your prednisone back down to 5 mg daily. You can pick up a new prescription to start ONLY after you are done with the taper. Call the podiatrist for your feet. If you have any trouble, call us back here. Make an appointment to see Dr. Althea Charon in about 1 month, after you see Dr. Kellie Simmering. Please feel free to call with any questions or concerns at any time, at [phone]. --Dr. Casper Harrison

## 2013-03-11 LAB — BASIC METABOLIC PANEL WITH GFR
BUN: 14 mg/dL (ref 6–23)
CO2: 31 meq/L (ref 19–32)
Calcium: 9 mg/dL (ref 8.4–10.5)
Chloride: 104 meq/L (ref 96–112)
Creat: 0.98 mg/dL (ref 0.50–1.10)
Glucose, Bld: 121 mg/dL — ABNORMAL HIGH (ref 70–99)
Potassium: 4.7 meq/L (ref 3.5–5.3)
Sodium: 142 meq/L (ref 135–145)

## 2013-03-11 LAB — CBC WITH DIFFERENTIAL/PLATELET
Basophils Absolute: 0.2 10*3/uL — ABNORMAL HIGH (ref 0.0–0.1)
Basophils Relative: 1 % (ref 0–1)
Eosinophils Absolute: 0 10*3/uL (ref 0.0–0.7)
Eosinophils Relative: 0 % (ref 0–5)
MCH: 32.2 pg (ref 26.0–34.0)
MCHC: 32.8 g/dL (ref 30.0–36.0)
MCV: 98.4 fL (ref 78.0–100.0)
Neutrophils Relative %: 67 % (ref 43–77)
Platelets: 471 10*3/uL — ABNORMAL HIGH (ref 150–400)
RDW: 15.5 % (ref 11.5–15.5)

## 2013-03-13 LAB — PATHOLOGIST SMEAR REVIEW

## 2013-03-14 ENCOUNTER — Encounter: Payer: Self-pay | Admitting: Family Medicine

## 2013-03-14 DIAGNOSIS — M069 Rheumatoid arthritis, unspecified: Secondary | ICD-10-CM | POA: Insufficient documentation

## 2013-03-14 NOTE — Assessment & Plan Note (Signed)
BP elevated this visit, but pt has been off of HCTZ since discharge due to misunderstanding of discharge instructions. Resume HCTZ and follow up PRN. Kidney function checked this visit, stable with improved/stable electrolytes. F/u with PCP, consider recheck BMP once HCTZ restarted.

## 2013-03-14 NOTE — Assessment & Plan Note (Signed)
Noted during hospitalization, suspected due to suppression from methotrexate/sulfasalazine. CBC rechecked this appointment, improved/apparently resolved. Will need to follow up with rheum.

## 2013-03-14 NOTE — Assessment & Plan Note (Addendum)
Hb noted to be ~11 on admission, trended down to ~9 during admission. Believed secondary to medications, now off methotrexate/sulfasalazine. CBC rechecked this visit; Hb stable to mildly improved, WBC elevated but pt is on prednisone at increased dose from baseline/chronic 5 mg, tapering back down. No signs/symptoms of symptomatic anemia or infection. Will need to f/u with rheum.  Addendum: Pathologist review states "Leukocytosis due to abosolute granulocytosis. Myeloid cells are predomininantly mature, but immature myeloid cells are also present. NRBCs are also present. Atypical lymphs. Normocytic/Normochromic anemia with unremarkable RBC morphology. Reactive thrombocytosis. Suggest hematologic evaluation, if clinically indicated." Would consider recheck CBC at follow-up and/or discussion with rheum, plus/minus referral to hematologist.

## 2013-03-14 NOTE — Assessment & Plan Note (Signed)
Pt with tender callous of right foot, no distinct ulcer/redness/drainage/bleeding. Recommended returning to podiatry. Considered paring down callous in clinic (pt reported having had callouses shaved here, in the past), but would defer to specialist given how tender pt's callous is and given pt's chronic skin condition related to rheumatology. Follow up PRN.

## 2013-03-14 NOTE — Progress Notes (Signed)
  Subjective:    Patient ID: Jocelyn Shelton, female    DOB: 24-Jul-1956, 57 y.o.   MRN: [id number]  HPI: Pt presents to clinic for hospital f/u. Pt was admitted for dehydration and electrolyte disturbance, diarrhea, constellation of symptoms concerning for methotrexate toxicity.  Hospitalization - Pt feeling much better since discharge. Stopped methotrexate and sulfasalazine per hospital providers and Dr. Kellie Simmering (pt's rheumatologist, consulted in-hospital) -had felt very tired, fatigued, nauseated; now all improved, able to cook/clean, care for her household, etc -pt had stopped eating and was only drinking water, now with much better appetite -pt is taking prednisone for burst/possible adrenal insufficiency, in the process of tapering to her chronic home dose of 5 mg daily -pt has f/u with Dr. Kellie Simmering in September, but has not called him to confirm or to see if he wants to see her sooner, yet  BP - Pt had some electrolyte abnormalities and relative hypotension and was not given HCTZ during part of her hospitalization -pt has not been taking HCTZ since discharge, but thinks she may have supposed to be taking it still (wanted clarification at this visit) -no headaches, change in vision, palpitations or chest pain  Review of Systems: As above. Pt does complain of "bad feet," painful plantar callouses which are not new, for which she has been seen a long time ago by podiatry.     Objective:   Physical Exam BP 151/94  Pulse 71  Temp(Src) 98.1 F (36.7 C) (Oral)  Ht 5\' 6"  (1.676 m)  Wt 167 lb (75.751 kg)  BMI 26.97 kg/m2  Gen: well-appearing adult female in NAD, very pleasant Pulm: CTAB, no wheezes Cardio: RRR Ext/skin: moves all extremities equally/spontaneously  skin of toes and fingers tight/drawn, at baseline; no distinct rashes noted  mild tenderness to lower extremities (at baseline, per pt) Feet: right foot ball of foot with thickened callous with central depression, tender but not  erythematous  no discreet ulcers, no erythema/bleeding/drainage     Assessment & Plan:

## 2013-03-14 NOTE — Assessment & Plan Note (Signed)
A: Pt recently hospitalized for suspected methotrexate/sulfasalazine toxicity, now feeling much better with discontinuing medications per consultation with pt's outpt rheumatologist Dr. Kellie Simmering. In the middle of steroid taper from increased burst prednisone for stress/possible adrenal insuffiency.   P: Continue to taper prednisone. Rx for 5 mg prednisone to resume once taper is complete, to resume at dose to be determined by Dr. Kellie Simmering. Otherwise advised pt to contact his office to schedule closer follow up with him (previously scheduled for September).

## 2013-03-16 ENCOUNTER — Ambulatory Visit: Payer: Medicaid Other | Admitting: Family Medicine

## 2013-04-06 ENCOUNTER — Other Ambulatory Visit: Payer: Self-pay | Admitting: Family Medicine

## 2013-04-06 DIAGNOSIS — K219 Gastro-esophageal reflux disease without esophagitis: Secondary | ICD-10-CM

## 2013-04-11 ENCOUNTER — Ambulatory Visit: Payer: Medicaid Other | Admitting: Family Medicine

## 2013-05-05 ENCOUNTER — Ambulatory Visit: Payer: Medicaid Other | Admitting: Family Medicine

## 2013-05-23 ENCOUNTER — Ambulatory Visit (INDEPENDENT_AMBULATORY_CARE_PROVIDER_SITE_OTHER): Payer: Medicaid Other | Admitting: Family Medicine

## 2013-05-23 ENCOUNTER — Encounter: Payer: Self-pay | Admitting: Family Medicine

## 2013-05-23 VITALS — BP 150/90 | HR 84 | Temp 98.2°F | Ht 66.0 in | Wt 171.0 lb

## 2013-05-23 DIAGNOSIS — I10 Essential (primary) hypertension: Secondary | ICD-10-CM

## 2013-05-23 DIAGNOSIS — M069 Rheumatoid arthritis, unspecified: Secondary | ICD-10-CM

## 2013-05-23 MED ORDER — DICLOFENAC 18 MG PO CAPS: 18.0000 mg | ORAL_CAPSULE | Freq: Three times a day (TID) | ORAL | Status: DC | PRN

## 2013-05-23 MED ORDER — HYDROCHLOROTHIAZIDE 25 MG PO TABS: 25.0000 mg | ORAL_TABLET | Freq: Every day | ORAL | Status: DC

## 2013-05-23 NOTE — Assessment & Plan Note (Addendum)
Uncontrolled BP, not at goal, elevated BP today 150/90.  Meds - HCTZ 25mg  daily, Metoprolol 50mg  BID, Verapamil 80mg  TID - Did not take today No complications Suspect elevated BP is due to not taking any of her BP meds today.   Plan:  1. Continue current BP meds. Normally compliant with meds. Re-check at next visit 2. No labs today, last BMET 03/10/13, follow serum Cr (last 0.98) 3. Lifestyle Mods - Advised smoking cessation, encouraged resume regular exercise 4. Monitor BP at home or at drug store occasionally.

## 2013-05-23 NOTE — Assessment & Plan Note (Addendum)
Generalized constant pain today (gradual worsening x months), symmetric bilateral arms, legs, feet, consistent with prior joint pain due to RA.  Meds - MTX 12.5mg  Mon-Tues, Prednisone 5mg  daily, occasional Tylenol, hx improvement with ibuprofen Next Rheum apt 06/06/2013  Plan: 1. Continue current RA regimen, follow recs per Dr. Kellie Simmering 2. Start Diclofenac 18mg  TID with meals PRN pain x 2 weeks, until see Dr. Kellie Simmering 06/06/13 3. Initially planned to get labs today including CBC to check wbc, hgb, plt - due to hx thrombocytopenia and recent reactive thrombocytosis. Pt already scheduled to get bloodwork drawn this afternoon for Dr. Kellie Simmering. Will call their office to request lab results, and if needed will repeat any labs. 4. Goal to stay active - eventually resume dancing

## 2013-05-23 NOTE — Patient Instructions (Addendum)
Dear Jocelyn Shelton, Thank you for coming in to clinic today. It was good to see you!  Today we discussed your Generalized Pain. 1. It sounds like your Rheumatoid Arthritis is causing pain in multiple areas and joints of your body. 2. As we talked about, staying active and moving around as much as you can will help improve your pain and make you feel better. If your joints stiffen up, then the pain will get worse. 3. Make sure to keep using a Heating Pad, this will definitely help. Also, you can continue the Tylenol 500mg  2 tablets twice a day as needed. 4. I sent in a new prescription for Diclofenac 18mg  tablets, anti-inflammatory medicine to take for the next 2 weeks until you see Dr. Kellie Simmering. Ask him if you should continue this one for your Rheumatoid Arthritis, he may continue it or change it. 5. I will not check labs today, since you are already going to get them drawn for Dr. Kellie Simmering. Please ask their office to fax [place] the results. I will call as well, and if there is some other lab that we need, we will notify you and order it.  We started a new medication today to help your Pain.  Diclofenac 18mg , please take 1 tablet 3 times a day as needed for moderate pain WITH MEALS (as it can upset your stomach)  Some important numbers from today's visit: BP - 150/90 (remember to take your blood pressure pills every day)  Please schedule a follow-up appointment with me in 2 - 4 months (after you've seen Dr. Kellie Simmering in November)  If you have any other questions or concerns, please feel free to call the clinic to contact me. You may also schedule an earlier appointment if necessary.  However, if your symptoms get significantly worse, please go to the Emergency Department to seek immediate medical attention.  Saralyn Pilar, DO [facility]

## 2013-05-23 NOTE — Progress Notes (Signed)
Subjective:     Patient ID: Jocelyn Shelton, female   DOB: January 17, 1956, 57 y.o.   MRN: [id number]  HPI  GENERALIZED PAIN: Reports generalized pain in both arms, legs, and feet. Constant 5/10, throbbing / aching pain, has been present for several months, compares similarly to prior pains with Rheumatoid Arthritis. Localizes pain primarily to joints (elbows, knees, ankle, toes, wrists). Treatment - Heating Pad helps, Tylenol 500mg  x2 tabs BID (mimimal relief), prior hx of improvement with Ibuprofen but question if made stomach upset Worsens - if sedentary, joints stiffen up, pain gets worse Function - stays active to improve pain, able to move all joints, difficulty sleeping at night due to pain makes her feel tired during day Denies any significant swelling or redness in joints.  RHEUMATOID ARTHRITIS: Managed by Dr. Kellie Simmering (Rheumatology) - last seen on 04/09/13 Current regimen - MTX 25mg  total weekly dose (divided 12.5mg  Mon / Tues), Prednisone 5mg  daily She states that her next apt with Dr. Kellie Simmering is June 06, 2013, and she is getting blood work for his office later today.  CHRONIC HTN: Elevated BP today, 150/90. Reports - no concerns Current Meds - HCTZ 25mg  daily, Metoprolol 50mg  BID, Verapamil 80mg  TID Reports good compliance, Did NOT take meds today. Tolerating well, w/o complaints. 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 Social hx: Current smoker 0.5ppd x 20 years, occasional EtOH  Review of Systems  See above HPI Additionally: Denies any fever, chills, weakness, numbness, tingling, nausea / vomiting, constipation, diarrhea, abdominal pain. No suicidal or homicidal ideation.     Objective:   Physical Exam  BP 150/90  Pulse 84  Temp(Src) 98.2 F (36.8 C) (Oral)  Ht 5\' 6"  (1.676 m)  Wt 171 lb (77.565 kg)   BMI 27.61 kg/m2  SpO2 99%  Gen: pleasant, well-appearing 57 yr F, NAD HEENT: NCAT, PERRLA, EOMI, dentures in place, MMM, pharynx clear Heart: RRR, no murmurs Pulm: CTAB, no wheezes, crackles, or rhonchi. Normal work of breathing Ext: mild +1 bilateral LE edema localized to ankles, +2 peripheral pulses MSK: minimal +TTP b/l feet (toes), elbows. Otherwise, hands and wrist non-tender. No appreciable erythema or significant localized joint edema. Full AROM bilateral UE and LE Neuro: awake, alert, oriented, grossly non-focal CN-II-XII intact, muscle str intact 5/5 bilateral grip, quad, hamstrings, intact distal sensation to light touch Psych: Appropriate affect, consistent with mood. Normal speech / thoughts     Assessment:     See specific A&P problem list for details.      Plan:     See specific A&P problem list for details.

## 2013-05-24 ENCOUNTER — Telehealth: Payer: Self-pay | Admitting: *Deleted

## 2013-05-24 ENCOUNTER — Other Ambulatory Visit: Payer: Self-pay | Admitting: Family Medicine

## 2013-05-24 DIAGNOSIS — M069 Rheumatoid arthritis, unspecified: Secondary | ICD-10-CM

## 2013-05-24 MED ORDER — DICLOFENAC SODIUM 50 MG PO TBEC: 50.0000 mg | DELAYED_RELEASE_TABLET | Freq: Three times a day (TID) | ORAL | Status: DC | PRN

## 2013-05-24 NOTE — Telephone Encounter (Signed)
Prior authorization form for Zorvolex placed in MD box, please return to [place] or [place] when complete.

## 2013-05-24 NOTE — Telephone Encounter (Signed)
Reviewed [facility] Preferred [facility] List, and decided to change prescription to alternative form of Diclofenac. Discontinued old Rx for Zorvolex. Sent new rx to [facility] pharmacy for Diclofenac Sodium 50mg  TID with meals PRN moderate pain (#90, 1 refill).

## 2013-05-25 ENCOUNTER — Other Ambulatory Visit: Payer: Self-pay | Admitting: Family Medicine

## 2013-05-25 MED ORDER — IPRATROPIUM BROMIDE HFA 17 MCG/ACT IN AERS: 2.0000 | INHALATION_SPRAY | Freq: Every day | RESPIRATORY_TRACT | Status: DC | PRN

## 2013-05-29 NOTE — Telephone Encounter (Signed)
Will FWD to MD.  Kayvan Hoefling L, CMA  

## 2013-06-05 ENCOUNTER — Telehealth: Payer: Self-pay | Admitting: Family Medicine

## 2013-06-05 DIAGNOSIS — I10 Essential (primary) hypertension: Secondary | ICD-10-CM

## 2013-06-05 NOTE — Telephone Encounter (Signed)
Will fwd to MD.  Elliette Seabolt L, CMA  

## 2013-06-05 NOTE — Telephone Encounter (Signed)
Pt called because she needs refills on metoprolol, fluticasone and hydrochlorothiazide sent to [facility] pharmacy at [facility]. The hydrochlorothiazide never was received at [facility] on 10/28. JW

## 2013-06-06 MED ORDER — HYDROCHLOROTHIAZIDE 25 MG PO TABS: 25.0000 mg | ORAL_TABLET | Freq: Every day | ORAL | Status: DC

## 2013-06-06 MED ORDER — FLUTICASONE PROPIONATE 50 MCG/ACT NA SUSP: 2.0000 | Freq: Every day | NASAL | Status: DC | PRN

## 2013-06-06 MED ORDER — METOPROLOL TARTRATE 50 MG PO TABS: 50.0000 mg | ORAL_TABLET | Freq: Two times a day (BID) | ORAL | Status: DC

## 2013-06-06 NOTE — Telephone Encounter (Signed)
Sent refills for HCTZ, Metoprolol, and Fluticasone to [facility]).

## 2013-06-19 ENCOUNTER — Telehealth: Payer: Self-pay | Admitting: Family Medicine

## 2013-06-19 ENCOUNTER — Encounter: Payer: Self-pay | Admitting: Family Medicine

## 2013-06-19 NOTE — Telephone Encounter (Signed)
Contacted Dr. Ines Bloomer office regarding follow-up lab results, and requested most recent labs to be faxed to [facility].  Jocelyn Shelton was last seen by me at [facility] on 05/23/13, at that time we discussed the importance of following up her labs (CBC) to monitor trend of WBC, Hgb, and Plts, due to hx thrombocytopenia and recent reactive thrombocytosis. Labs were not drawn at that time in our clinic, since pt currently had lab draw scheduled for 05/24/13, and upcoming appointment with Dr. Kellie Simmering.  Received faxed copy of CBC (w/o diff) and CMET today from Dr. Ines Bloomer office. Results as follows: (will also submit document to be scanned)  CBC: WBC 5.8 / Hgb 12.3 / Hct 36.2 / Plt 234 - with noted significant improvement from previous CBC (wbc 16.6, hgb 9.8, plt 471) with leukocytosis and suspected reactive thrombocytosis.  CMET: Na 137 / K 3.3 / Cl 101 / CO2 108 / BUN 8 / Cr 1.02 / T Bili 0.4 / Alk phos 87 / AST 14 / ALT 8 - No significant change from previous. Follow-up as regularly scheduled.  Overall, CBC is reassuring with noted improvement. Continue to follow patient on regular basis, and will coordinate care with Dr. Ines Bloomer office.

## 2013-06-26 ENCOUNTER — Other Ambulatory Visit: Payer: Self-pay | Admitting: Family Medicine

## 2013-06-28 ENCOUNTER — Other Ambulatory Visit: Payer: Self-pay | Admitting: Family Medicine

## 2013-06-28 NOTE — Telephone Encounter (Signed)
Will fwd to PCP.  Isaak Delmundo L, CMA  

## 2013-09-14 ENCOUNTER — Other Ambulatory Visit: Payer: Self-pay | Admitting: Family Medicine

## 2013-10-25 ENCOUNTER — Other Ambulatory Visit: Payer: Self-pay | Admitting: Family Medicine

## 2013-11-22 ENCOUNTER — Other Ambulatory Visit: Payer: Self-pay | Admitting: Family Medicine

## 2013-12-28 ENCOUNTER — Other Ambulatory Visit: Payer: Self-pay | Admitting: Family Medicine

## 2013-12-28 DIAGNOSIS — F339 Major depressive disorder, recurrent, unspecified: Secondary | ICD-10-CM

## 2013-12-28 DIAGNOSIS — K219 Gastro-esophageal reflux disease without esophagitis: Secondary | ICD-10-CM

## 2013-12-28 DIAGNOSIS — I10 Essential (primary) hypertension: Secondary | ICD-10-CM

## 2014-02-27 ENCOUNTER — Other Ambulatory Visit: Payer: Self-pay

## 2014-02-27 DIAGNOSIS — Z1231 Encounter for screening mammogram for malignant neoplasm of breast: Secondary | ICD-10-CM

## 2014-03-14 ENCOUNTER — Ambulatory Visit
Admission: RE | Admit: 2014-03-14 | Discharge: 2014-03-14 | Disposition: A | Discharge status: Home / Self Care | Payer: Medicaid Other | Source: Ambulatory Visit

## 2014-03-14 DIAGNOSIS — Z1231 Encounter for screening mammogram for malignant neoplasm of breast: Secondary | ICD-10-CM

## 2014-03-26 ENCOUNTER — Encounter: Payer: Self-pay | Admitting: Family Medicine

## 2014-03-26 ENCOUNTER — Ambulatory Visit (INDEPENDENT_AMBULATORY_CARE_PROVIDER_SITE_OTHER): Payer: Medicaid Other | Admitting: Family Medicine

## 2014-03-26 VITALS — BP 139/86 | HR 76 | Temp 98.3°F | Ht 66.0 in | Wt 167.9 lb

## 2014-03-26 DIAGNOSIS — M79671 Pain in right foot: Secondary | ICD-10-CM

## 2014-03-26 DIAGNOSIS — I10 Essential (primary) hypertension: Secondary | ICD-10-CM

## 2014-03-26 DIAGNOSIS — F17200 Nicotine dependence, unspecified, uncomplicated: Secondary | ICD-10-CM

## 2014-03-26 DIAGNOSIS — B070 Plantar wart: Secondary | ICD-10-CM | POA: Insufficient documentation

## 2014-03-26 DIAGNOSIS — Z1389 Encounter for screening for other disorder: Secondary | ICD-10-CM

## 2014-03-26 DIAGNOSIS — M069 Rheumatoid arthritis, unspecified: Secondary | ICD-10-CM

## 2014-03-26 DIAGNOSIS — M79609 Pain in unspecified limb: Secondary | ICD-10-CM

## 2014-03-26 DIAGNOSIS — Z720 Tobacco use: Secondary | ICD-10-CM

## 2014-03-26 DIAGNOSIS — Z0000 Encounter for general adult medical examination without abnormal findings: Secondary | ICD-10-CM | POA: Insufficient documentation

## 2014-03-26 DIAGNOSIS — F339 Major depressive disorder, recurrent, unspecified: Secondary | ICD-10-CM

## 2014-03-26 DIAGNOSIS — D649 Anemia, unspecified: Secondary | ICD-10-CM

## 2014-03-26 DIAGNOSIS — G8929 Other chronic pain: Secondary | ICD-10-CM | POA: Insufficient documentation

## 2014-03-26 DIAGNOSIS — M79673 Pain in unspecified foot: Secondary | ICD-10-CM

## 2014-03-26 LAB — BASIC METABOLIC PANEL
BUN: 6 mg/dL (ref 6–23)
CALCIUM: 9 mg/dL (ref 8.4–10.5)
CHLORIDE: 103 meq/L (ref 96–112)
CO2: 30 meq/L (ref 19–32)
CREATININE: 0.97 mg/dL (ref 0.50–1.10)
GLUCOSE: 95 mg/dL (ref 70–99)
Potassium: 3.9 mEq/L (ref 3.5–5.3)
Sodium: 142 mEq/L (ref 135–145)

## 2014-03-26 LAB — CBC
HEMATOCRIT: 42.4 % (ref 36.0–46.0)
Hemoglobin: 14.6 g/dL (ref 12.0–15.0)
MCH: 33.9 pg (ref 26.0–34.0)
MCHC: 34.4 g/dL (ref 30.0–36.0)
MCV: 98.4 fL (ref 78.0–100.0)
Platelets: 178 10*3/uL (ref 150–400)
RBC: 4.31 MIL/uL (ref 3.87–5.11)
RDW: 16 % — AB (ref 11.5–15.5)
WBC: 6 10*3/uL (ref 4.0–10.5)

## 2014-03-26 LAB — POCT GLYCOSYLATED HEMOGLOBIN (HGB A1C): HEMOGLOBIN A1C: 5.6

## 2014-03-26 MED ORDER — OLANZAPINE 5 MG PO TABS: ORAL_TABLET | ORAL | Status: DC

## 2014-03-26 MED ORDER — SALICYLIC ACID 17 % EX SOLN: Freq: Every day | CUTANEOUS | Status: DC

## 2014-03-26 MED ORDER — SALICYLIC ACID 17 % EX SOLN: CUTANEOUS | Status: DC

## 2014-03-26 MED ORDER — GABAPENTIN 100 MG PO CAPS: 100.0000 mg | ORAL_CAPSULE | Freq: Three times a day (TID) | ORAL | Status: DC

## 2014-03-26 NOTE — Assessment & Plan Note (Signed)
Bilateral (R significantly > L) foot pain consistent with likely multiple etiologies - Likely R foot with pain from multiple plantar warts - Bilateral plantar aspects sensitive to light touch, possible peripheral neuropathy - Possible pain due to RA  Plan: 1. Treat plantar warts 2. Trial Gabapentin 100mg  TID to see if improves pain, consider neuropathic etiologies 3. Screen for DM, normal A1c - less likely 4. Recommend cane for ambulation assistance 5. Consider X-rays if worsening

## 2014-03-26 NOTE — Assessment & Plan Note (Signed)
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 Rx Salicylic Acid topical, advised on plan to soak nightly, file with [employer], apply salicylic acid, coat with adhesive o/n, may take weeks to months 2. RTC 3 months for re-evaluation, if not improving consider referral to Podiatrist

## 2014-03-26 NOTE — Progress Notes (Signed)
Subjective:     Patient ID: Jocelyn Shelton, female   DOB: May 28, 1956, 58 y.o.   MRN: [id number]  HPI  FOOT PAIN (bilateral) / CALLUSES / PLANTAR WARTS (R-foot): - Complains of pain in both feet, R > L, mostly toes, "feels like constant throbbing", lasts several minutes, worse with walking and pressure on feet, states pain is mostly localized to "callsuses" on bottom of Right foot. Currently soaks feet and scrubs to remove calluses but unable to remove. Previously seen by Podiatrist >2 years ago for similar complaint. - Taking Diclofenac every "now and then", twice weekly, cautioned by Rheum due to potential for GI bleed. No hx of GI bleeding or PUD before. - Taking Tylenol and Ibuprofen PRN (200mg  up to 4x daily), sometimes helps - Ambulating without cane or assistance - Denies injury, trauma, accident, swelling, redness, numbness, weakness, or tingling  GENERALIZED PAIN / RA: - Followed by Dr. (Rheumatology) - last seen 02/2014, next apt 04/2014 - Continues to take MTX 25mg  total weekly dose (divided 12.5mg  Mon / Tues), Prednisone 5mg  daily - Localizes pain primarily to joints (elbows, knees, ankle, toes, wrists), currently with stable pain intermittently present, similar to previous RA pain. Worse if decreased activity Denies any significant swelling or redness in joints.  CHRONIC HTN:  Reports - no concerns. Checks occasionally at home by daughter. Current Meds - HCTZ 25mg  daily, Metoprolol 50mg  BID, Verapamil 80mg  TID  Reports good compliance, Did take meds today. Tolerating well, w/o complaints.  Denies CP, dyspnea, claudication, HA, edema, dizziness / lightheadedness  Tobacco Abuse: - Active smoker, currently 10 cigs daily - Not ready to quit  I have reviewed and updated the following as appropriate: allergies and current medications  Social Hx: - Active smoker   Review of Systems  See above HPI     Objective:   Physical Exam  BP 139/86  Pulse 76  Temp(Src)  98.3 F (36.8 C) (Oral)  Ht 5\' 6"  (1.676 m)  Wt 167 lb 14.4 oz (76.159 kg)  BMI 27.11 kg/m2  Gen - well-appearing, pleasant, NAD Neck - supple, non-tender Heart - RRR, no murmurs heard Lungs - CTAB, no wheezing, crackles, or rhonchi. Normal work of breathing. Ext - Right Foot: exquisite tenderness to light palpation across dorsal and plantar aspect various areas including digits, seems to be most tender localized to 3 calluses, suspicious for plantar warts (lateral great toe, plantar aspect of great toe MTP, lateral aspect of forefoot), without erythema, edema, ulcerations, FROM, ankle stability intact. Left Foot: mild generalized tenderness to light touch, FROM, bilateral peripheral pulses intact +2 MSK - Back non-tender to palpation, No evidence of major joint edema or erythema bilateral elbows, knees, FROM Skin - warm, dry, no rashes Neuro - awake, alert, grossly non-focal, intact muscle strength 5/5 b/l upper and lower ext, intact distal sensation to light touch, gait limited due to pain with weightbearing on Right foot      Assessment:     See specific A&P problem list for details.      Plan:     See specific A&P problem list for details.

## 2014-03-26 NOTE — Patient Instructions (Addendum)
Dear Jocelyn Shelton, Thank you for coming in to clinic today. It was good to see you!  Today we discussed your Generalized Pain / Foot Pain. 1. For your Right Foot, it looks like you have several "Plantar Warts" causing some of your pain. Also you may have some "peripheral neuropathy", which is nerve damage in your feet causing pains.  - For the 3 warts / calluses on your Right Foot  - Continue soaking, and use a [employer] to file them down every night  - After filing, use the prescribed Salicylic Acid (or may get over the counter) and apply it directly to the raw skin surface  - Cover with an adhesive or tape (may use black electrical tape) and then go to sleep, remove adhesive in the morning  - Repeat process nightly for weeks to months until removed  - If this is not successful after 3 months, I can refer you back to the Podiatrist (Foot Doctor)  - Also, prescribed Gabapentin - take 1 of the 100mg  capsules starting at night only for several days, then may increase to twice daily, and after 1 week, start taking this medicine three times a day (breakfast, lunch, and before bed, every 8 hours or three times a day) 2. Continue to follow Dr. for your Rheumatoid Arthritis 3. We will check your labs today and send you a letter with results  Please schedule a follow-up appointment with me in 2 - 4 months (after you've seen Dr. Kellie Simmering in October)  If you have any other questions or concerns, please feel free to call the clinic to contact me. You may also schedule an earlier appointment if necessary.  However, if your symptoms get significantly worse, please go to the Emergency Department to seek immediate medical attention.  November, DO [facility]   Plantar Warts Warts are benign (noncancerous) growths of the outer skin layer. They can occur at any time in life but are most common during childhood and the teen years. Warts can occur on many skin surfaces  of the body. When they occur on the underside (sole) of your foot they are called plantar warts. They often emerge in groups with several small warts encircling a larger growth. CAUSES  Human papillomavirus (HPV) is the cause of plantar warts. HPV attacks a break in the skin of the foot. Walking barefoot can lead to exposure to the wart virus. Plantar warts tend to develop over areas of pressure such as the heel and ball of the foot. Plantar warts often grow into the deeper layers of skin. They may spread to other areas of the sole but cannot spread to other areas of the body. SYMPTOMS  You may also notice a growth on the undersurface of your foot. The wart may grow directly into the sole of the foot, or rise above the surface of the skin on the sole of the foot, or both. They are most often flat from pressure. Warts generally do not cause itching but may cause pain in the area of the wart when you put weight on your foot. DIAGNOSIS  Diagnosis is made by physical examination. This means your caregiver discovers it while examining your foot.  TREATMENT  There are many ways to treat plantar warts. However, warts are very tough. Sometimes it is difficult to treat them so that they go away completely and do not grow back. Any treatment must be done regularly to work. If left untreated, most plantar warts  will eventually disappear over a period of one to two years. Treatments you can do at home include:  Putting duct tape over the top of the wart (occlusion) has been found to be effective over several months. The duct tape should be removed each night and reapplied until the wart has disappeared.  Placing over-the-counter medications on top of the wart to help kill the wart virus and remove the wart tissue (salicylic acid, cantharidin, and dichloroacetic acid) are useful. These are called keratolytic agents. These medications make the skin soft and gradually layers will shed away. These compounds are usually  placed on the wart each night and then covered with a bandage. They are also available in premedicated bandage form. Avoid surrounding skin when applying these liquids as these medications can burn healthy skin. The treatment may take several months of nightly use to be effective.  Cryotherapy to freeze the wart has recently become available over-the-counter for children 4 years and older. This system makes use of a soft narrow applicator connected to a bottle of compressed cold liquid that is applied directly to the wart. This medication can burn healthy skin and should be used with caution.  As with all over-the-counter medications, read the directions carefully before use. Treatments generally done in your caregiver's office include:  Some aggressive treatments may cause discomfort, discoloration, and scarring of the surrounding skin. The risks and benefits of treatment should be discussed with your caregiver.  Freezing the wart with liquid nitrogen (cryotherapy, see above).  Burning the wart with use of very high heat (cautery).  Injecting medication into the wart.  Surgically removing or laser treatment of the wart.  Your caregiver may refer you to a dermatologist for difficult to treat large-sized warts or large numbers of warts. HOME CARE INSTRUCTIONS   Soak the affected area in warm water. Dry the area completely when you are done. Remove the top layer of softened skin, then apply the chosen topical medication and reapply a bandage.  Remove the bandage daily and file excess wart tissue (pumice stone works well for this purpose). Repeat the entire process daily or every other day for weeks until the plantar wart disappears.  Several brands of salicylic acid pads are available as over-the-counter remedies.  Pain can be relieved by wearing a donut bandage. This is a bandage with a hole in it. The bandage is put on with the hole over the wart. This helps take the pressure off the wart  and gives pain relief. To help prevent plantar warts:  Wear shoes and socks and change them daily.  Keep feet clean and dry.  Check your feet and your children's feet regularly.  Avoid direct contact with warts on other people.  Have growths or changes on your skin checked by your caregiver. Document Released: 10/03/2003 Document Revised: 11/27/2013 Document Reviewed: 03/13/2009 [facility] Patient Information 2015 [place], [place]. This information is not intended to replace advice given to you by your health care provider. Make sure you discuss any questions you have with your health care provider.

## 2014-03-26 NOTE — Assessment & Plan Note (Signed)
Stable generalized pain, consistent with prior RA  Plan: 1. Continue to follow-up with Dr. Kellie Simmering (next apt 04/2014) 2. No changes to regimen since last visit 3. May use either Ibuprofen or Diclofenac PRN pain, cont Tylenol 4. Check CBC, f/u prior anemia and thrombocytopenia

## 2014-03-26 NOTE — Assessment & Plan Note (Signed)
Continues to smoke, inc amount 10 cigs daily - Not ready to quit  Plan: 1. Smoking cessation counseling provided

## 2014-03-26 NOTE — Assessment & Plan Note (Signed)
Concern given bilateral foot pain for possible peripheral neuropathy, as complication of possible diabetes  Plan: 1. A1c screening, normal 5.6 (03/26/14) 2. No evidence of Diabetes 3. Cont to work-up foot pain, likely plantar warts, rheumatoid arthritis

## 2014-03-26 NOTE — Assessment & Plan Note (Signed)
Well-controlled HTN  No complications   Plan:  1. Continue current BP meds.  2. Check BMET today 3. Lifestyle Mods - Smoking cessation 4. Monitor BP at home or at drug store occasionally.

## 2014-03-28 ENCOUNTER — Encounter: Payer: Self-pay | Admitting: Family Medicine

## 2014-03-28 ENCOUNTER — Telehealth: Payer: Self-pay | Admitting: *Deleted

## 2014-03-28 NOTE — Telephone Encounter (Signed)
Spoke with pharmacist at [facility], they stated that they do not carry rx salicylic acid, and will call and notify patient to pick-up Salicylic acid product OTC.  Saralyn Pilar, DO [facility], PGY-2

## 2014-03-28 NOTE — Telephone Encounter (Signed)
Received Rx from [facility] stating they don't carry salicylic acid-lactic acid 17 % external solution.  Clovis Pu, RN

## 2014-04-04 ENCOUNTER — Other Ambulatory Visit: Payer: Self-pay | Admitting: *Deleted

## 2014-04-04 DIAGNOSIS — F339 Major depressive disorder, recurrent, unspecified: Secondary | ICD-10-CM

## 2014-04-04 MED ORDER — OLANZAPINE 5 MG PO TABS: ORAL_TABLET | ORAL | Status: DC

## 2014-04-19 ENCOUNTER — Other Ambulatory Visit: Payer: Self-pay | Admitting: *Deleted

## 2014-04-19 DIAGNOSIS — F339 Major depressive disorder, recurrent, unspecified: Secondary | ICD-10-CM

## 2014-04-19 MED ORDER — OLANZAPINE 5 MG PO TABS: ORAL_TABLET | ORAL | Status: DC

## 2014-04-23 ENCOUNTER — Other Ambulatory Visit: Payer: Self-pay | Admitting: Family Medicine

## 2014-05-10 ENCOUNTER — Encounter: Payer: Self-pay | Admitting: Family Medicine

## 2014-06-25 ENCOUNTER — Other Ambulatory Visit: Payer: Self-pay | Admitting: Family Medicine

## 2014-07-25 ENCOUNTER — Other Ambulatory Visit: Payer: Self-pay | Admitting: Family Medicine

## 2014-07-25 DIAGNOSIS — M79671 Pain in right foot: Principal | ICD-10-CM

## 2014-07-25 DIAGNOSIS — G8929 Other chronic pain: Secondary | ICD-10-CM

## 2014-08-08 ENCOUNTER — Ambulatory Visit: Payer: Medicaid Other | Admitting: Family Medicine

## 2014-08-24 ENCOUNTER — Other Ambulatory Visit: Payer: Self-pay | Admitting: Family Medicine

## 2014-08-30 ENCOUNTER — Ambulatory Visit: Payer: Medicaid Other | Admitting: Family Medicine

## 2014-09-10 ENCOUNTER — Ambulatory Visit: Payer: Medicaid Other | Admitting: Family Medicine

## 2014-10-23 ENCOUNTER — Other Ambulatory Visit: Payer: Self-pay | Admitting: *Deleted

## 2014-10-23 DIAGNOSIS — I10 Essential (primary) hypertension: Secondary | ICD-10-CM

## 2014-10-24 ENCOUNTER — Other Ambulatory Visit: Payer: Self-pay | Admitting: Family Medicine

## 2014-10-25 MED ORDER — VERAPAMIL HCL 80 MG PO TABS: 80.0000 mg | ORAL_TABLET | Freq: Three times a day (TID) | ORAL | Status: DC

## 2014-10-25 MED ORDER — METOPROLOL TARTRATE 50 MG PO TABS: 50.0000 mg | ORAL_TABLET | Freq: Two times a day (BID) | ORAL | Status: DC

## 2014-10-25 MED ORDER — HYDROCHLOROTHIAZIDE 25 MG PO TABS: 25.0000 mg | ORAL_TABLET | Freq: Every day | ORAL | Status: DC

## 2014-11-19 ENCOUNTER — Encounter: Payer: Self-pay | Admitting: *Deleted

## 2014-11-19 ENCOUNTER — Other Ambulatory Visit: Payer: Self-pay | Admitting: Family Medicine

## 2014-11-19 DIAGNOSIS — I10 Essential (primary) hypertension: Secondary | ICD-10-CM

## 2014-11-19 DIAGNOSIS — F330 Major depressive disorder, recurrent, mild: Secondary | ICD-10-CM

## 2014-11-19 MED ORDER — OLANZAPINE 5 MG PO TABS: 5.0000 mg | ORAL_TABLET | Freq: Every day | ORAL | Status: DC

## 2014-11-19 NOTE — Progress Notes (Signed)
Prior Authorization received from [facility] pharmacy for Zyprexa 5 mg. Formulary and PA form placed in provider box for completion. Clovis Pu, RN

## 2014-11-19 NOTE — Progress Notes (Signed)
Received PA request on 11/19/14 for patient, Jocelyn Shelton, for Zyprexa 5mg  PO qhs daily #30 pills, indicated for Chronic Depression. Reviewed PA paperwork, stated that generic Olanzapine is on preferred Medicaid list. Called patient to confirm, she is out and unable to refill at beginning of month, she has been receiving Zyprexa chronically from [facility] for depression, no longer followed at [facility] and does not plan to return unless needed but was told she needs to stay on this medication long-term, currently controlling her depression. [facility] pharmacy, spoke with pharmacist, he stated that her last fill was in February 2016 for 30 pills, covered by [facility]. He was able to override the prior authorization and submit the medication given that it is listed on preferred list, phoned in rx for generic Olanzapine 5mg  PO qhs daily (#30, 11 refills) for 1 year supply. Called patient back to notify about her prescription.  Did not complete PA paperwork, not needed at this time. Note forwarded to [facility], RN.  , DO [facility], PGY-2

## 2014-12-03 ENCOUNTER — Ambulatory Visit (INDEPENDENT_AMBULATORY_CARE_PROVIDER_SITE_OTHER): Payer: Medicaid Other | Admitting: Family Medicine

## 2014-12-03 ENCOUNTER — Encounter: Payer: Self-pay | Admitting: Family Medicine

## 2014-12-03 VITALS — BP 130/85 | HR 86 | Temp 98.5°F | Ht 66.0 in | Wt 162.0 lb

## 2014-12-03 DIAGNOSIS — M7581 Other shoulder lesions, right shoulder: Secondary | ICD-10-CM

## 2014-12-03 DIAGNOSIS — J309 Allergic rhinitis, unspecified: Secondary | ICD-10-CM | POA: Insufficient documentation

## 2014-12-03 DIAGNOSIS — I10 Essential (primary) hypertension: Secondary | ICD-10-CM | POA: Diagnosis not present

## 2014-12-03 DIAGNOSIS — G894 Chronic pain syndrome: Secondary | ICD-10-CM | POA: Insufficient documentation

## 2014-12-03 DIAGNOSIS — F330 Major depressive disorder, recurrent, mild: Secondary | ICD-10-CM

## 2014-12-03 DIAGNOSIS — M069 Rheumatoid arthritis, unspecified: Secondary | ICD-10-CM | POA: Diagnosis not present

## 2014-12-03 MED ORDER — DULOXETINE HCL 60 MG PO CPEP: 60.0000 mg | ORAL_CAPSULE | Freq: Every day | ORAL | Status: DC

## 2014-12-03 MED ORDER — DICLOFENAC SODIUM 50 MG PO TBEC: 50.0000 mg | DELAYED_RELEASE_TABLET | Freq: Three times a day (TID) | ORAL | Status: DC | PRN

## 2014-12-03 MED ORDER — FLUTICASONE PROPIONATE 50 MCG/ACT NA SUSP: 2.0000 | Freq: Every day | NASAL | Status: DC | PRN

## 2014-12-03 NOTE — Assessment & Plan Note (Signed)
Well-controlled HTN  No complications   Plan:  1. Continue current BP meds.  2. Not due for BMET - until after 02/2015 3. Lifestyle Mods - Smoking cessation 4. Monitor BP at home or at drug store occasionally.

## 2014-12-03 NOTE — Assessment & Plan Note (Signed)
Chronic history since 2008 with prior diagnosis major depression with recurrent episodes, had problems with psychotic features. Currently most consistent with behavioral disturbances / anger/irritation and insomnia. - No longer followed by Psychiatry / Behavioral health - initially established, prior [facility]? - PHQ2: 0, not very difficult - No SI/HI  Plan: 1. Continue Zyprexa 5mg  PO QHS as previously rx 2. Start Cymbalta 60mg  po daily - titrate up with half tab 30mg  daily x 1 week then go to 60 - For mood improvement, chronic pain syndrome, and insomnia 3. Referral to Psychiatry to re-establish, see if any med titration or change needs to be done

## 2014-12-03 NOTE — Assessment & Plan Note (Signed)
Current flare with R-shoulder rotator cuff tendinitis / bursitis. No evidence of rotator cuff tear without any weakness or significant loss of active ROM. Spurling's negative without radicular symptoms. - S/p R-shoulder injection 08/2014 by Dr. Kellie Simmering - On RA meds  Plan: 1. Refilled Diclofenac 50mg  PO take TID PRN up to 5-7 days for current rotator cuff tendinitis and generalized RA flares 2. May increase Tylenol to 1000mg  TID (instead of BID) 3. Moist heat for R-shoulder, stretching, strengthening exercises 4. Consider future referral to PT / Sports Med if no improvement given complication with RA.

## 2014-12-03 NOTE — Assessment & Plan Note (Addendum)
Consistent with chronic RA generalized joint pain and current flare with R-shoulder rotator cuff tendinitis / bursitis. No evidence of rotator cuff tear without any weakness or significant loss of active ROM. Spurling's negative without radicular symptoms. - S/p R-shoulder injection 08/2014 by Dr. Kellie Simmering - Cont MTX 25mg /wk (12.5 M, 12.5 Tues), Sulfasalazine 1000mg  BID, Prednisone 5mg  PO q few days PRN, on folic acid 1mg  daily  Plan: 1. Continue to follow-up with Dr. M (next apt 12/2014) 2. Refilled Diclofenac 50mg  PO take TID PRN up to 5-7 days for current rotator cuff tendinitis and generalized RA flares 3. May increase Tylenol to 1000mg  TID (instead of BID) 4. Moist heat for R-shoulder, stretching, strengthening exercises 5. Additionally, start Cymbalta for chronic pain syndrome and depression/insomnia

## 2014-12-03 NOTE — Progress Notes (Signed)
Subjective:    Patient ID: Jocelyn Shelton, female    DOB: 1956/05/14, 59 y.o.   MRN: [id number]  HPI  CHRONIC HTN:  Reports - no concerns. Occasionally checked at home. Current Meds - HCTZ 25mg  daily, Metoprolol 50mg  BID, Verapamil 80mg  TID  Reports good compliance, Did take meds today. Tolerating well, w/o complaints.  Denies CP, dyspnea, claudication, HA, edema, dizziness / lightheadedness  CHRONIC PAIN SYNDROME / RHEUMATOID ARTHRITIS: - Followed by Dr. (Rheumatology) - last seen 04/2014, next apt 12/2014 - Currently taking Sulfasalazine 1000mg  BID (500mg  tabs), MTX (2.5mg  tabs) 25mg  total weekly dose (divided 12.5mg  Mon / Tues), has available Prednisone 5mg  advised to take in bursts 1-2 days then stop PRN flare - Previously rx Diclofenac last year by me for pain, however Rheum advised her to not take it everyday due to risk of GIB. However she has complete discontinued it. No prior h/o GIB or PUD. - Taking Tylenol 500mg  up to 1-2 per dose, total 2 times daily (up to 2000mg ) without significant relief - No longer taking Gabapentin given at last visit 02/2014, tried to take up to 3 times daily without relief, self discontinued. - Reports generalized pain, worse in Right shoulder / arm and bilateral knees (R>L), also bilateral hands (chronic pain), worse with increased activity. R-shoulder worse with raising arm above head and has limited internal rotation movement behind back. - No assistance with ambulation - Denies any injury or trauma, swelling or redness of joints, weakness, numbness, tingling  INSOMNIA / H/o MAJOR DEPRESSION WITH PSYCHOSIS: - Chronic history since 2008 with prior diagnosis major depression with recurrent episodes, had problems with psychotic features. She had seen Psychiatry at that time and reports hospitalized at [facility] - She has been overall doing well, has been on Zyprexa 5mg  PO qhs this was initially started by Psych, and then continued by  [facility]. Tolerating without problems. No longer following regularly with Psych. - Today reports gradual worsening behavioral issues with concerns of anger, mood swings, and irritation. Still controlled on Zyprexa, but asking if any additional treatment or dose change. - PHQ-2 - 0, not difficult at all. Does not seem to be affecting daily life - Admits poor sleep at night, sometimes due to pain - Denies any active depression, manic symptoms. No suicidal or homicidal ideation.  I have reviewed and updated the following as appropriate: allergies and current medications  Social Hx: - Active smoker about 10 cigs daily, not ready to quit  Review of Systems     Objective:   Physical Exam  BP 130/85 mmHg  Pulse 86  Temp(Src) 98.5 F (36.9 C) (Oral)  Ht 5\' 6"  (1.676 m)  Wt 162 lb (73.483 kg)  BMI 26.16 kg/m2  Gen - well-appearing, pleasant, cooperative, NAD Neck - supple, non-tender Heart - RRR, no murmurs heard Ext - non-tender, no edema, intact peripheral pulses MSK: - Back non-tender to palpation, No evidence of major joint edema or erythema bilateral elbows, knees, FROM - Right Shoulder: normal inspection, full active ROM forward flex / abduction above head, mildly limited internal rotation, no swelling or effusion, minimal +TTP, rotator cuff str 5/5 intact, supraspinatus mild pain w/o weakness, empty can same pain w/o weakness. Hawkins test positive for impingement Skin - warm, dry, no rashes Neuro - awake, alert, grossly non-focal, intact muscle strength 5/5 b/l upper and lower ext, intact distal sensation to light touch, normal gait     Assessment & Plan:   See specific A&P problem  list for details.

## 2014-12-03 NOTE — Patient Instructions (Addendum)
Dear Jocelyn Shelton, Thank you for coming in to clinic today. It was good to see you!  Today we discussed your Generalized Pain / Rheumatoid Arthritis  1. New medicine - start taking Cymbalta 60mg  - cut pills in half (take 30mg ) daily for 1 week, then increase to whole tablet daily for next 1 month. 2. Ordered referral to go back to [facility] - you will be contacted about this, important to discuss your Zyprexa with them to see if they will make a change 3. For your Chronic Pain - Cymbalta will help your pain and sleep. Also, sent refill for Diclofenac - take 1 tablet every 8 hours with food for 5 to 7 days then STOP, only use for up to 1 week at a time to avoid problem. May continue Tylenol up to 2 tablets per dose up to 3 times daily (max 6 tabs in 1 day). Use Heating pad on shoulder and do stretching.  Due for labs and pap smear in September. Recommend Colonoscopy - colonoscopy hand out given advised pt to call to schedule.  Please schedule follow-up to see me in 1 month to discuss mood and cymbalta, otherwise if follow-up with Behavioral Health does not need to return immediately to see me.  Please schedule an Annual Physical for Pap Smear to see me in September. Follow-up with Dr. October in June.  If you have any other questions or concerns, please feel free to call the clinic to contact me. You may also schedule an earlier appointment if necessary.  However, if your symptoms get significantly worse, please go to the Emergency Department to seek immediate medical attention.  Kellie Simmering, DO [facility]

## 2014-12-04 ENCOUNTER — Encounter: Payer: Self-pay | Admitting: *Deleted

## 2014-12-04 DIAGNOSIS — M069 Rheumatoid arthritis, unspecified: Secondary | ICD-10-CM

## 2014-12-04 DIAGNOSIS — M7581 Other shoulder lesions, right shoulder: Secondary | ICD-10-CM

## 2014-12-04 MED ORDER — MELOXICAM 7.5 MG PO TABS: 7.5000 mg | ORAL_TABLET | Freq: Every day | ORAL | Status: DC

## 2014-12-04 NOTE — Progress Notes (Signed)
Last seen [facility] 12/03/14 by me. Given rx for Diclofenac EC 50mg  TID PRN for rheumatoid arthritis and right rotator cuff tendinitis.  Reviewed PA form, decision to discontinue non-preferred diclofenac and send new e-script to pharmacy for Meloxicam 7.5mg  PO daily x 1-2 weeks then prn (#30, 1 refill). Called patient, spoke with Mrs. Jocelyn Shelton to relay this change of rx, she had not attempted to pick up Diclofenac yet. Advised her to take Meloxicam daily for 1-2 weeks then PRN, also not to take Advil, ibuprofen, aleve, naproxen, diclofenac along with this medication. Follow-up as planned with rheum and myself.  Note forwarded to Rosealee Albee, RN - will shred PA form, not need to complete at this time.  Dorisann Frames, DO [facility], PGY-2

## 2014-12-04 NOTE — Progress Notes (Signed)
Prior Authorization received from [employer] for Diclofenac. Formulary and PA form placed in provider box for completion. Clovis Pu, RN

## 2014-12-04 NOTE — Progress Notes (Signed)
I was the preceptor for this visit. 

## 2015-01-21 ENCOUNTER — Telehealth: Payer: Self-pay | Admitting: Family Medicine

## 2015-01-21 ENCOUNTER — Other Ambulatory Visit: Payer: Self-pay | Admitting: Family Medicine

## 2015-01-21 DIAGNOSIS — I10 Essential (primary) hypertension: Secondary | ICD-10-CM

## 2015-01-21 DIAGNOSIS — F330 Major depressive disorder, recurrent, mild: Secondary | ICD-10-CM

## 2015-01-21 DIAGNOSIS — K219 Gastro-esophageal reflux disease without esophagitis: Secondary | ICD-10-CM

## 2015-01-21 NOTE — Telephone Encounter (Signed)
[name] is calling because she needs a refill on OLANZapine (ZYPREXA) 5 MG tablet as soon as it is possible. She states that she has been without this medication all month. Thank you, Dorothey Baseman, ASA

## 2015-01-23 ENCOUNTER — Other Ambulatory Visit: Payer: Self-pay | Admitting: *Deleted

## 2015-01-23 DIAGNOSIS — F330 Major depressive disorder, recurrent, mild: Secondary | ICD-10-CM

## 2015-01-23 MED ORDER — OLANZAPINE 5 MG PO TABS: 5.0000 mg | ORAL_TABLET | Freq: Every day | ORAL | Status: DC

## 2015-01-23 NOTE — Telephone Encounter (Addendum)
Reviewed chart. Previously able to phone in Olanzapine refill to pharmacy in 07/2014, authorized 11 refills for 1 year supply, per pharmacy this was accepted via override, no PA required. However, now they state that it would be required and would be sending PA documentation, also provided # to call. On 01/23/15, waiting to receive this PA form. Dorisann Frames, RN aware.  Note Patient is Medicaid Ins, will need PDL PA request submitted electronically by Dorisann Frames, RN.  Confirmed patient has filled Olanzapine rx at [facility] ([place]) $3 co-pay, they will be able to authorize 11 refills for 1 year supply. Patient has already picked up medication. No further action required.  Saralyn Pilar, DO [facility], PGY-2

## 2015-01-23 NOTE — Telephone Encounter (Signed)
Patient with Medicaid insurance, reviewed PDL for Medicaid and confirmed Olanzapine (generic Zyprexa) is preferred, however discussing with [facility], they stated that the rx has been "overrided x 3" for this year, and cannot authorize it. For all anti-psychotic medications, need to get authorization from [facility] Office and provide info on diagnosis, indication.  Called Medicaid office at [phone]. Confirmed rx Olanzapine 5mg  PO qhs, dx MDD recurrent features, target symptom anger/irritability. Provided Approval # . Requested to phone in rx to pharmacy for #30 tabs, 11 refills, authorized for 1 year.  Phoned in rx to pharmacy, confirmed $3 co-pay, they will notify patient once ready for pick-up.  [id number], DO [facility], PGY-2

## 2015-02-08 ENCOUNTER — Emergency Department (HOSPITAL_COMMUNITY): Payer: Medicaid Other

## 2015-02-08 ENCOUNTER — Inpatient Hospital Stay (HOSPITAL_COMMUNITY)
Admission: EM | Admit: 2015-02-08 | Discharge: 2015-02-11 | DRG: 187 | Disposition: A | Discharge status: Home / Self Care | Payer: Medicaid Other | Attending: Family Medicine | Admitting: Family Medicine

## 2015-02-08 ENCOUNTER — Encounter (HOSPITAL_COMMUNITY): Payer: Self-pay | Admitting: *Deleted

## 2015-02-08 DIAGNOSIS — R109 Unspecified abdominal pain: Secondary | ICD-10-CM

## 2015-02-08 DIAGNOSIS — R339 Retention of urine, unspecified: Secondary | ICD-10-CM | POA: Diagnosis present

## 2015-02-08 DIAGNOSIS — I272 Other secondary pulmonary hypertension: Secondary | ICD-10-CM | POA: Diagnosis present

## 2015-02-08 DIAGNOSIS — I493 Ventricular premature depolarization: Secondary | ICD-10-CM | POA: Diagnosis present

## 2015-02-08 DIAGNOSIS — J90 Pleural effusion, not elsewhere classified: Secondary | ICD-10-CM | POA: Diagnosis not present

## 2015-02-08 DIAGNOSIS — Z8249 Family history of ischemic heart disease and other diseases of the circulatory system: Secondary | ICD-10-CM

## 2015-02-08 DIAGNOSIS — M79604 Pain in right leg: Secondary | ICD-10-CM | POA: Diagnosis present

## 2015-02-08 DIAGNOSIS — R0781 Pleurodynia: Secondary | ICD-10-CM | POA: Diagnosis not present

## 2015-02-08 DIAGNOSIS — J309 Allergic rhinitis, unspecified: Secondary | ICD-10-CM | POA: Diagnosis present

## 2015-02-08 DIAGNOSIS — R000 Tachycardia, unspecified: Secondary | ICD-10-CM | POA: Diagnosis not present

## 2015-02-08 DIAGNOSIS — I7300 Raynaud's syndrome without gangrene: Secondary | ICD-10-CM | POA: Diagnosis present

## 2015-02-08 DIAGNOSIS — E876 Hypokalemia: Secondary | ICD-10-CM | POA: Diagnosis not present

## 2015-02-08 DIAGNOSIS — M069 Rheumatoid arthritis, unspecified: Secondary | ICD-10-CM

## 2015-02-08 DIAGNOSIS — E8770 Fluid overload, unspecified: Secondary | ICD-10-CM | POA: Insufficient documentation

## 2015-02-08 DIAGNOSIS — K219 Gastro-esophageal reflux disease without esophagitis: Secondary | ICD-10-CM | POA: Diagnosis present

## 2015-02-08 DIAGNOSIS — R634 Abnormal weight loss: Secondary | ICD-10-CM | POA: Diagnosis present

## 2015-02-08 DIAGNOSIS — Z79899 Other long term (current) drug therapy: Secondary | ICD-10-CM

## 2015-02-08 DIAGNOSIS — R100 Acute abdomen: Secondary | ICD-10-CM | POA: Diagnosis not present

## 2015-02-08 DIAGNOSIS — R079 Chest pain, unspecified: Secondary | ICD-10-CM | POA: Diagnosis not present

## 2015-02-08 DIAGNOSIS — F339 Major depressive disorder, recurrent, unspecified: Secondary | ICD-10-CM | POA: Diagnosis present

## 2015-02-08 DIAGNOSIS — G894 Chronic pain syndrome: Secondary | ICD-10-CM | POA: Diagnosis present

## 2015-02-08 DIAGNOSIS — R590 Localized enlarged lymph nodes: Secondary | ICD-10-CM | POA: Diagnosis present

## 2015-02-08 DIAGNOSIS — Z888 Allergy status to other drugs, medicaments and biological substances status: Secondary | ICD-10-CM

## 2015-02-08 DIAGNOSIS — I10 Essential (primary) hypertension: Secondary | ICD-10-CM | POA: Diagnosis present

## 2015-02-08 DIAGNOSIS — Z6824 Body mass index (BMI) 24.0-24.9, adult: Secondary | ICD-10-CM

## 2015-02-08 DIAGNOSIS — Z91018 Allergy to other foods: Secondary | ICD-10-CM

## 2015-02-08 DIAGNOSIS — R0902 Hypoxemia: Secondary | ICD-10-CM | POA: Insufficient documentation

## 2015-02-08 DIAGNOSIS — I509 Heart failure, unspecified: Secondary | ICD-10-CM | POA: Diagnosis present

## 2015-02-08 DIAGNOSIS — F17210 Nicotine dependence, cigarettes, uncomplicated: Secondary | ICD-10-CM | POA: Diagnosis present

## 2015-02-08 DIAGNOSIS — M349 Systemic sclerosis, unspecified: Secondary | ICD-10-CM | POA: Diagnosis present

## 2015-02-08 DIAGNOSIS — M1990 Unspecified osteoarthritis, unspecified site: Secondary | ICD-10-CM | POA: Diagnosis present

## 2015-02-08 DIAGNOSIS — I081 Rheumatic disorders of both mitral and tricuspid valves: Secondary | ICD-10-CM | POA: Diagnosis present

## 2015-02-08 DIAGNOSIS — R0609 Other forms of dyspnea: Secondary | ICD-10-CM | POA: Diagnosis present

## 2015-02-08 DIAGNOSIS — M79605 Pain in left leg: Secondary | ICD-10-CM | POA: Diagnosis present

## 2015-02-08 LAB — COMPREHENSIVE METABOLIC PANEL
ALT: 21 U/L (ref 14–54)
AST: 34 U/L (ref 15–41)
Albumin: 3.2 g/dL — ABNORMAL LOW (ref 3.5–5.0)
Alkaline Phosphatase: 87 U/L (ref 38–126)
Anion gap: 9 (ref 5–15)
BUN: <5 mg/dL — ABNORMAL LOW (ref 6–20)
CO2: 27 mmol/L (ref 22–32)
Calcium: 8.4 mg/dL — ABNORMAL LOW (ref 8.9–10.3)
Chloride: 99 mmol/L — ABNORMAL LOW (ref 101–111)
Creatinine, Ser: 0.82 mg/dL (ref 0.44–1.00)
GFR calc non Af Amer: >60 mL/min (ref >60)
Glucose, Bld: 101 mg/dL — ABNORMAL HIGH (ref 65–99)
POTASSIUM: 3.6 mmol/L (ref 3.5–5.1)
Sodium: 135 mmol/L (ref 135–145)
TOTAL PROTEIN: 7.3 g/dL (ref 6.5–8.1)
Total Bilirubin: 0.5 mg/dL (ref 0.3–1.2)

## 2015-02-08 LAB — CBC WITH DIFFERENTIAL/PLATELET
BASOS ABS: 0 10*3/uL (ref 0.0–0.1)
BASOS PCT: 0 % (ref 0–1)
EOS ABS: 0.1 10*3/uL (ref 0.0–0.7)
EOS PCT: 1 % (ref 0–5)
HCT: 37.2 % (ref 36.0–46.0)
Hemoglobin: 12.3 g/dL (ref 12.0–15.0)
Lymphocytes Relative: 13 % (ref 12–46)
Lymphs Abs: 0.9 10*3/uL (ref 0.7–4.0)
MCH: 31.9 pg (ref 26.0–34.0)
MCHC: 33.1 g/dL (ref 30.0–36.0)
MCV: 96.6 fL (ref 78.0–100.0)
Monocytes Absolute: 0.4 10*3/uL (ref 0.1–1.0)
Monocytes Relative: 6 % (ref 3–12)
NEUTROS ABS: 5.5 10*3/uL (ref 1.7–7.7)
Neutrophils Relative %: 80 % — ABNORMAL HIGH (ref 43–77)
PLATELETS: 248 10*3/uL (ref 150–400)
RBC: 3.85 MIL/uL — ABNORMAL LOW (ref 3.87–5.11)
RDW: 13.7 % (ref 11.5–15.5)
WBC: 6.9 10*3/uL (ref 4.0–10.5)

## 2015-02-08 LAB — I-STAT CG4 LACTIC ACID, ED
Lactic Acid, Venous: 1.42 mmol/L (ref 0.5–2.0)
Lactic Acid, Venous: 1.8 mmol/L (ref 0.5–2.0)

## 2015-02-08 LAB — LIPASE, BLOOD: Lipase: 27 U/L (ref 22–51)

## 2015-02-08 LAB — I-STAT TROPONIN, ED: Troponin i, poc: 0.01 ng/mL (ref 0.00–0.08)

## 2015-02-08 LAB — BRAIN NATRIURETIC PEPTIDE: B Natriuretic Peptide: 236.4 pg/mL — ABNORMAL HIGH (ref 0.0–100.0)

## 2015-02-08 MED ORDER — IOHEXOL 300 MG/ML  SOLN
100.0000 mL | Freq: Once | INTRAMUSCULAR | Status: AC | PRN
  Administered 2015-02-08: 100 mL via INTRAVENOUS

## 2015-02-08 MED ORDER — MORPHINE SULFATE 4 MG/ML IJ SOLN
4.0000 mg | Freq: Once | INTRAMUSCULAR | Status: AC
Start: 2015-02-08 — End: 2015-02-08
  Administered 2015-02-08: 4 mg via INTRAVENOUS
  Filled 2015-02-08: qty 1

## 2015-02-08 MED ORDER — HYDROMORPHONE HCL 1 MG/ML IJ SOLN: 1.0000 mg | INTRAMUSCULAR | Status: AC | PRN

## 2015-02-08 MED ORDER — ENOXAPARIN SODIUM 80 MG/0.8ML ~~LOC~~ SOLN
70.0000 mg | Freq: Once | SUBCUTANEOUS | Status: AC
  Administered 2015-02-08: 70 mg via SUBCUTANEOUS
  Filled 2015-02-08: qty 0.8

## 2015-02-08 MED ORDER — METOPROLOL TARTRATE 25 MG PO TABS
50.0000 mg | ORAL_TABLET | Freq: Once | ORAL | Status: AC
  Administered 2015-02-08: 50 mg via ORAL
  Filled 2015-02-08: qty 2

## 2015-02-08 MED ORDER — HYDROCHLOROTHIAZIDE 25 MG PO TABS
25.0000 mg | ORAL_TABLET | Freq: Every day | ORAL | Status: DC
  Administered 2015-02-08: 25 mg via ORAL
  Filled 2015-02-08: qty 1

## 2015-02-08 MED ORDER — IOHEXOL 300 MG/ML  SOLN
25.0000 mL | Freq: Once | INTRAMUSCULAR | Status: AC | PRN
  Administered 2015-02-08: 25 mL via ORAL

## 2015-02-08 MED ORDER — MORPHINE SULFATE 4 MG/ML IJ SOLN
4.0000 mg | Freq: Once | INTRAMUSCULAR | Status: AC
  Administered 2015-02-08: 4 mg via INTRAVENOUS
  Filled 2015-02-08: qty 1

## 2015-02-08 NOTE — ED Notes (Signed)
Bladder scan reveled 300 ml of urine present in bladder

## 2015-02-08 NOTE — Progress Notes (Signed)
Family Medicine Teaching [facility] Admission History and Physical Service Pager: [phone]  Patient name: Jocelyn Shelton Medical record number: [id number] Date of birth: September 17, 1955 Age: 59 y.o. Gender: female  Primary Care Provider: Saralyn Pilar, DO Consultants: None Code Status: FULL  Chief Complaint: chest and abdominal pain  Assessment and Plan: Jocelyn Shelton is a 59 y.o. female presenting with chest and abdominal pain. PMH is significant for HTN, GERD, RA, and scleroderma.   Chest and abdominal pain - CT abd in ED showed small R pleural effusion and loculated L pleural effusion without inflammatory process. BNP in ED 236. CXR suggestive of mild CHF (patient reports no prior diagnosis). Patient tachycardic (HR 103) and tachypneic (highest RR 28) in ED. WBC 6.9, afebrile, LA 1.8. Abdominal pain not surgical. H/o appendectomy, no SBO and negative CT abd/pelvis. More concerned about pleuritic-type chest pain. Wells for PE is only 1 as alternative Dx also likely and absolutely NO signs/RFs for DVT.  - CTA chest in AM; could not perform overnight due to increased dye load after CT abd in ED. Will give IVF overnight and consider CTA and ABG if hypoxemia continues. No V/Q scan with lung findings on imaging. - Recommend echo inpt vs. outpt. Equivocal BNP 236 and interstitial edema on CXR. No clinical stigmata or previous dx of CHF. No echo on file and may be helpful given possibility of pulmonary HTN. ?compensated cor pulmonale.   - Check 1 more troponin, repeat ECG in AM (sinus tach and signs of RVH in ED) - Dilaudid PRN  Pleural effusions: R > L seen on CT abd, consistent with exam and associated with dry cough and dyspnea on exertion in the setting of long term tx with MTX for RA/scleroderma. Possibly due to rheumatoid pneumonitis/ILD vs. MTX-induced lung dz/interstitial pneumonitis vs. PE. Malignancy in this long time smoker not ruled out. Not septic and do not suspect  PNA/empyema.  - Consider diagnostic thoracentesis; will make NPO p MN and plan to check serum LDH, CMP in AM. Coags also ordered. Also consider pulmonology consult +/- HRCT.  - Oxygen by [place] prn - Therapeutic trial of duonebs given scattered wheezes. Long time smoker, no formal dx COPD.   Rheumatoid arthritis: Followed by Dr. Kellie Simmering, on MTX, folate, and prednisone prn flares.  - Start indomethacin and a little over maintenance IVF (SCr 0.82 likely borderline elevated for degree of muscle mass) - Continue folate. MTX is Mon, Tues.   HTN - BP 196/106 in ED. Patient did not take usual BP meds on day of admission.  - Home meds (Hydrodiuril) given in ED - Monitor  - Restart home medications metoprolol, HCTZ, and verapamil (?adjuntive tx for reynaud's)  Urinary retention - patient reports difficulty beginning urinary stream recently. Was unable to provide urine sample in the ED. Bladder scan showed 300 mL. Not sure why the difficulty voiding. No anticholinergics.  - In and out cath for UA. - Monitor  Use of black cohosh  - patient says she takes supplement twice a day when she gets "too hot." Can lead to hepatic SE.  - AST, ALT, tbili WNL - INR in AM  FEN/GI: NPO w/sips with meds overnight for possible procedure. Heart healthy diet afterwards.  Prophylaxis: Lovenox  Disposition: Admit to inpatient in SDU on FMTS, [place].   History of Present Illness: Jocelyn Shelton is a 59 y.o. female presenting with chest and abdominal pain.   [name] reports that for the past few days she has had  pain everywhere, especially around her ribs on both side. She said the pain has been excruciating when coughing and "sticking pain" when taking a deep breath. Also with increasing SOB over the past week, especially on exertion. She reports an associated cough without sputum production. She also reports lower back muscle spasms and bilateral leg pain. She has been taking Tylenol and ibuprofen (about 200mg  each  day) over the past two days which did not alleviate her pain. She also reports chest pain in her midsternal region radiating to her umbilicus. She took Catering manager for this chest pain which did not relieve her symptoms either.   She denies any recent travel or hospitalizations, and does not know if she has ever had a blood clot. She doesn't know if she has a family history of clots. She reports recent unintentional weight loss which she attributes to lack of appetite. She does report difficulty initiating urinary stream recently, but denies dysuria.    She reports taking black cohosh twice a day if she gets "too hot." Did not take medications on day of presentation.   Poor historian with very nondescript symptoms and generalized complaints   Review Of Systems: Per HPI with the following additions: denies N/V/C/D, fevers, urinary incontinence; endorses fatigue Otherwise 12 point review of systems was performed and was unremarkable.  Patient Active Problem List   Diagnosis Date Noted  . Pleural effusion 02/08/2015  . Allergic rhinitis 12/03/2014  . Tendinitis of right rotator cuff 12/03/2014  . Chronic pain syndrome 12/03/2014  . Diabetes mellitus screening 03/26/2014  . Chronic foot pain 03/26/2014  . Plantar wart of right foot 03/26/2014  . Rheumatoid arthritis 03/14/2013  . Anemia 03/02/2013  . Other pancytopenia 03/01/2013  . Thrombocytopenia, unspecified 03/01/2013  . Facial rash 02/02/2012  . 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 Erythema nodosum 04/27/2011  . GASTROESOPHAGEAL REFLUX DISEASE 08/02/2008  . CORNS AND CALLOSITIES 03/18/2007  . Major depressive disorder, recurrent episode 09/23/2006  . Tobacco abuse 09/23/2006  . HYPERTENSION, BENIGN SYSTEMIC 09/23/2006  . RAYNAUDS SYNDROME 09/23/2006  . CLAUDICATION, INTERMITTENT 09/23/2006  . OSTEOARTHRITIS, MULTI SITES 09/23/2006   Past Medical History: Past Medical History  Diagnosis Date  . Hypertension   . Reflux   .  Scleroderma   . Rheumatoid arthritis(714.0)    Past Surgical History: Past Surgical History  Procedure Laterality Date  . Appendectomy  90's   Social History: History  Substance Use Topics  . Smoking status: Current Every Day Smoker -- 0.50 packs/day for 40 years    Types: Cigarettes  . Smokeless tobacco: Not on file  . Alcohol Use: 2.0 oz/week    4 drink(s) per week   Additional social history: none. Please also refer to relevant sections of EMR.  Family History: Family History  Problem Relation Age of Onset  . Hypertension Father   . Heart attack Mother   . Diabetes Daughter   . Diabetes Sister   . Lung cancer Brother 50   Allergies and Medications: Allergies  Allergen Reactions  . Aspirin Other (See Comments)    Stomach cramps  . Cymbalta [Duloxetine Hcl] Nausea Only    No appetite, stomach pain  . Tomato Rash   No current facility-administered medications on file prior to encounter.   Current Outpatient Prescriptions on File Prior to Encounter  Medication Sig Dispense Refill  . acetaminophen (TYLENOL) 500 MG tablet Take 1,000 mg by mouth daily as needed for pain.     . ATROVENT HFA 17 MCG/ACT inhaler INHALE  TWO PUFFS BY MOUTH ONCE DAILY AS NEEDED FOR WHEEZING 13 g 2  . BLACK COHOSH PO Take 1 capsule by mouth 2 (two) times daily.    Marland Kitchen CALCIUM PO Take 1 tablet by mouth daily.    . cetirizine (ZYRTEC) 10 MG tablet Take 1 tablet (10 mg total) by mouth daily. 30 tablet 6  . fluticasone (FLONASE) 50 MCG/ACT nasal spray Place 2 sprays into both nostrils daily as needed for rhinitis. 16 g 1  . folic acid (FOLVITE) 1 MG tablet TAKE 1 TABLET (1 MG TOTAL) BY MOUTH DAILY. 30 tablet 6  . hydrochlorothiazide (HYDRODIURIL) 25 MG tablet Take 1 tablet (25 mg total) by mouth daily. 90 tablet 0  . meloxicam (MOBIC) 7.5 MG tablet Take 1 tablet (7.5 mg total) by mouth daily. Take for up to 2 weeks then stop and use as needed only. 30 tablet 1  . methotrexate (RHEUMATREX) 2.5 MG  tablet Take 12.5 mg by mouth 2 (two) times a week. Take 5 tablets every Monday and Tuesday. Caution:Chemotherapy. Protect from light.    . metoprolol (LOPRESSOR) 50 MG tablet TAKE ONE TABLET BY MOUTH TWICE DAILY 180 tablet 2  . Multiple Vitamin (MULTIVITAMIN WITH MINERALS) TABS tablet Take 1 tablet by mouth daily.    Marland Kitchen OLANZapine (ZYPREXA) 5 MG tablet Take 1 tablet (5 mg total) by mouth at bedtime. 30 tablet 11  . omeprazole (PRILOSEC) 40 MG capsule TAKE ONE CAPSULE BY MOUTH ONCE DAILY 90 capsule 2  . verapamil (CALAN) 80 MG tablet TAKE ONE TABLET BY MOUTH THREE TIMES DAILY 90 tablet 3  . DULoxetine (CYMBALTA) 60 MG capsule Take 1 capsule (60 mg total) by mouth daily. (Patient not taking: Reported on 02/08/2015) 30 capsule 2  . predniSONE (DELTASONE) 5 MG tablet Take 1 tablet (5 mg total) by mouth daily. Do not start taking until finished with taper down to 5 mg. (Patient not taking: Reported on 12/03/2014) 30 tablet 0    Objective: BP 184/98 mmHg  Pulse 98  Temp(Src) 99.9 F (37.7 C) (Oral)  Resp 24  SpO2 100% Exam: General: frail cachectic-appearing elderly female lying in bed in NAD Eyes: PERRLA ENTM: dry/cracked lips Cardiovascular: tachycardic, no murmurs appreciated, no JVD, no pitting edema Respiratory: end expiratory wheezing, diminished breath sounds at both bases, very poor air movement on R side. Nonlabored 100% on 2L O2.  Abdomen: soft, generalized tenderness without rebound or guarding, non-distended, +BS MSK: negative Homan's sign Skin: no rashes or bruises noted Neuro: A&Ox3, no focal deficits, 5/5 strength LE bilaterally.  Psych: appropriate mood and affect low volume and rate of speech,   Labs and Imaging: CBC BMET   Recent Labs Lab 02/08/15 1746  WBC 6.9  HGB 12.3  HCT 37.2  PLT 248    Recent Labs Lab 02/08/15 1746  NA 135  K 3.6  CL 99*  CO2 27  BUN <5*  CREATININE 0.82  GLUCOSE 101*  CALCIUM 8.4*     Dg Chest 2 View  02/08/2015   CLINICAL DATA:   Weakness, chest pain and abdominal pain.  EXAM: CHEST  2 VIEW  COMPARISON:  04/13/2011  FINDINGS: Heart size appears normal. Small bilateral pleural effusions are identified. There is mild interstitial edema and decreased aeration of both lung bases. The visualized osseous structures are unremarkable.  IMPRESSION: 1. Mild congestive heart failure is suspected.   Electronically Signed   By: Signa Kell M.D.   On: 02/08/2015 18:52   Ct Abdomen Pelvis W Contrast  02/08/2015   CLINICAL DATA:  Abdominal pain for 2 weeks  EXAM: CT ABDOMEN AND PELVIS WITH CONTRAST  TECHNIQUE: Multidetector CT imaging of the abdomen and pelvis was performed using the standard protocol following bolus administration of intravenous contrast.  CONTRAST:  OMNIPAQUE IOHEXOL 300 MG/ML  SOLN  COMPARISON:  None.  FINDINGS: There is small right pleural effusion. Small loculated left antral lateral pleural effusion. Mild atelectasis noted bilateral lower lobe.  Sagittal images of the spine are unremarkable.  Atherosclerotic calcifications of abdominal aorta and iliac arteries are noted.  Liver, pancreas, spleen and adrenal glands are unremarkable.  Kidneys are symmetrical in size and enhancement. No hydronephrosis or hydroureter.  No aortic aneurysm.  Delayed renal images shows bilateral renal symmetrical excretion.  No small bowel obstruction. No ascites or free air. No adenopathy. The uterus is unremarkable. There is no adnexal mass. No pericecal inflammation. The patient is status post appendectomy. The terminal ileum is unremarkable.  IMPRESSION: 1. There is small right pleural effusion. Small loculated left pleural effusion. Bilateral lower lobe posterior atelectasis. 2. No acute inflammatory process within abdomen or pelvis. 3. No hydronephrosis or hydroureter. 4. No pericecal inflammation.  Status post appendectomy. 5. Unremarkable uterus and adnexa.   Electronically Signed   By: Natasha Mead M.D.   On: 02/08/2015 19:45      Abigail Shelbie Hutching, MD 02/08/2015, 10:32 PM PGY-1, [facility] FPTS Intern pager: [phone], text pages welcome   I have seen and evaluated the patient with Dr. Natale Milch. I am in agreement with the note above in its revised form. My additions are in red.  Ryan B. Jarvis Newcomer, MD, PGY-3 02/09/2015 12:50 AM

## 2015-02-08 NOTE — ED Notes (Signed)
Pt unable to provide urine sample.

## 2015-02-08 NOTE — ED Notes (Signed)
Pt arrives from home via [facility]. Pt states she has been having abdominal pain for 2 weeks that has gotten progressively worse. Pt was laying in the fetal position crying when EMS arrived. Pt states she has also been feeling fatigued, but denies n/v/d.

## 2015-02-08 NOTE — ED Notes (Signed)
Admitting at bedside - Family medicine teaching service - Dr. Jarvis Newcomer

## 2015-02-08 NOTE — ED Notes (Signed)
Patient transported to CT 

## 2015-02-08 NOTE — ED Provider Notes (Signed)
CSN: [id number]     Arrival date & time 02/08/15  1703 History   First MD Initiated Contact with Patient 02/08/15 1703     Chief Complaint  Patient presents with  . Abdominal Pain     (Consider location/radiation/quality/duration/timing/severity/associated sxs/prior Treatment) The history is provided by the patient and medical records. No language interpreter was used.     Jocelyn Shelton is a 59 y.o. female  with a hx of HTN, GERD, RA, Scleroderma presents to the Emergency Department complaining of gradual, persistent, progressively worsening lateral chest and abdominal pain onset 2 weeks ago.  Pt reports symptoms have significantly worsened in the last 3 days. Associated symptoms include abdominal pain that radiates into the bilateral anterior chest for the last 24 hours.  Per EMS pt was found in the fetal position and crying in pain.  Pt given Fentanyl 150mg  PTA with reduction in pain from a 10/10 to 5/10.  Pt denies N/V/D, weakness, dizziness, syncope, dysuria, hematuria.  Patient denies aggravating or alleviating factors.  He reports subjective fever and chills at home.  Pt denies any, neck pain, chest pain, shortness of breath, back pain.     Past Medical History  Diagnosis Date  . Hypertension   . Reflux   . Scleroderma   . Rheumatoid arthritis(714.0)    Past Surgical History  Procedure Laterality Date  . Appendectomy  90's   Family History  Problem Relation Age of Onset  . Hypertension Father   . Heart attack Mother   . Diabetes Daughter   . Diabetes Sister   . Lung cancer Brother 50   History  Substance Use Topics  . Smoking status: Current Every Day Smoker -- 0.50 packs/day for 40 years    Types: Cigarettes  . Smokeless tobacco: Not on file  . Alcohol Use: 2.0 oz/week    4 drink(s) per week   OB History    No data available     Review of Systems  Constitutional: Positive for fatigue. Negative for fever, diaphoresis, appetite change and unexpected weight  change.  HENT: Negative for mouth sores.   Eyes: Negative for visual disturbance.  Respiratory: Positive for cough. Negative for chest tightness, shortness of breath and wheezing.   Cardiovascular: Positive for chest pain.  Gastrointestinal: Positive for abdominal pain. Negative for nausea, vomiting, diarrhea and constipation.  Endocrine: Negative for polydipsia, polyphagia and polyuria.  Genitourinary: Negative for dysuria, urgency, frequency and hematuria.  Musculoskeletal: Negative for back pain and neck stiffness.  Skin: Negative for rash.  Allergic/Immunologic: Negative for immunocompromised state.  Neurological: Negative for syncope, light-headedness and headaches.  Hematological: Does not bruise/bleed easily.  Psychiatric/Behavioral: Negative for sleep disturbance. The patient is not nervous/anxious.       Allergies  Aspirin; Cymbalta; and Tomato  Home Medications   Prior to Admission medications   Medication Sig Start Date End Date Taking? Authorizing Provider  acetaminophen (TYLENOL) 500 MG tablet Take 1,000 mg by mouth daily as needed for pain.    Yes Historical Provider, MD  ATROVENT HFA 17 MCG/ACT inhaler INHALE TWO PUFFS BY MOUTH ONCE DAILY AS NEEDED FOR WHEEZING 08/27/14  Yes 10/26/14, DO  bismuth subsalicylate (PEPTO BISMOL) 262 MG/15ML suspension Take 30 mLs by mouth every 6 (six) hours as needed for indigestion.   Yes Historical Provider, MD  BLACK COHOSH PO Take 1 capsule by mouth 2 (two) times daily.   Yes Historical Provider, MD  CALCIUM PO Take 1 tablet by mouth daily.  Yes Historical Provider, MD  cetirizine (ZYRTEC) 10 MG tablet Take 1 tablet (10 mg total) by mouth daily. 11/23/11  Yes Everrett Coombe, DO  fluticasone (FLONASE) 50 MCG/ACT nasal spray Place 2 sprays into both nostrils daily as needed for rhinitis. 12/03/14  Yes Alexander Fredric Mare, DO  folic acid (FOLVITE) 1 MG tablet TAKE 1 TABLET (1 MG TOTAL) BY MOUTH DAILY. 01/22/15  Yes Alexander  J Karamalegos, DO  hydrochlorothiazide (HYDRODIURIL) 25 MG tablet Take 1 tablet (25 mg total) by mouth daily. 10/25/14  Yes Narda Bonds, MD  ibuprofen (ADVIL,MOTRIN) 200 MG tablet Take 200 mg by mouth every 6 (six) hours as needed for mild pain or moderate pain.   Yes Historical Provider, MD  meloxicam (MOBIC) 7.5 MG tablet Take 1 tablet (7.5 mg total) by mouth daily. Take for up to 2 weeks then stop and use as needed only. 12/04/14  Yes Alexander Fredric Mare, DO  methotrexate (RHEUMATREX) 2.5 MG tablet Take 12.5 mg by mouth 2 (two) times a week. Take 5 tablets every Monday and Tuesday. Caution:Chemotherapy. Protect from light.   Yes Historical Provider, MD  metoprolol (LOPRESSOR) 50 MG tablet TAKE ONE TABLET BY MOUTH TWICE DAILY 01/21/15  Yes Smitty Cords, DO  Multiple Vitamin (MULTIVITAMIN WITH MINERALS) TABS tablet Take 1 tablet by mouth daily.   Yes Historical Provider, MD  OLANZapine (ZYPREXA) 5 MG tablet Take 1 tablet (5 mg total) by mouth at bedtime. 01/23/15  Yes Alexander Fredric Mare, DO  omeprazole (PRILOSEC) 40 MG capsule TAKE ONE CAPSULE BY MOUTH ONCE DAILY 01/21/15  Yes Smitty Cords, DO  verapamil (CALAN) 80 MG tablet TAKE ONE TABLET BY MOUTH THREE TIMES DAILY 11/19/14  Yes Smitty Cords, DO  DULoxetine (CYMBALTA) 60 MG capsule Take 1 capsule (60 mg total) by mouth daily. Patient not taking: Reported on 02/08/2015 12/03/14   Smitty Cords, DO  predniSONE (DELTASONE) 5 MG tablet Take 1 tablet (5 mg total) by mouth daily. Do not start taking until finished with taper down to 5 mg. Patient not taking: Reported on 12/03/2014 03/10/13   Stephanie Coup Street, MD   BP 175/90 mmHg  Pulse 96  Temp(Src) 99.9 F (37.7 C) (Oral)  Resp 28  SpO2 89% Physical Exam  Constitutional: She appears well-developed and well-nourished. No distress.  Awake, alert, nontoxic appearance  HENT:  Head: Normocephalic and atraumatic.  Mouth/Throat: Oropharynx is clear and  moist. No oropharyngeal exudate.  Eyes: Conjunctivae are normal. No scleral icterus.  Neck: Normal range of motion. Neck supple.  Cardiovascular: Normal rate, regular rhythm, normal heart sounds and intact distal pulses.   No murmur heard. Pulmonary/Chest: Effort normal and breath sounds normal. No respiratory distress. She has no wheezes. She exhibits tenderness (bilateral chest).  Equal chest expansion Pleural friction rub on the right Mild expiratory wheezes left base  Abdominal: Soft. Bowel sounds are normal. She exhibits no distension and no mass. There is no tenderness. There is no rebound and no guarding.  Abdomen firm, not rigid Patient reports TTP throughout but no guarding or rebound tenderness  Musculoskeletal: Normal range of motion. She exhibits no edema.  Neurological: She is alert.  Speech is clear and goal oriented Moves extremities without ataxia  Skin: Skin is warm and dry. She is not diaphoretic. No erythema.  Psychiatric: She has a normal mood and affect.  Nursing note and vitals reviewed.   ED Course  Procedures (including critical care time) Labs Review Labs Reviewed  CBC WITH DIFFERENTIAL/PLATELET -  Abnormal; Notable for the following:    RBC 3.85 (*)    Neutrophils Relative % 80 (*)    All other components within normal limits  COMPREHENSIVE METABOLIC PANEL - Abnormal; Notable for the following:    Chloride 99 (*)    Glucose, Bld 101 (*)    BUN <5 (*)    Calcium 8.4 (*)    Albumin 3.2 (*)    All other components within normal limits  BRAIN NATRIURETIC PEPTIDE - Abnormal; Notable for the following:    B Natriuretic Peptide 236.4 (*)    All other components within normal limits  LIPASE, BLOOD  URINALYSIS, ROUTINE W REFLEX MICROSCOPIC (NOT AT [facility])  I-STAT CG4 LACTIC ACID, ED  I-STAT TROPOININ, ED  I-STAT CG4 LACTIC ACID, ED    Imaging Review Dg Chest 2 View  02/08/2015   CLINICAL DATA:  Weakness, chest pain and abdominal pain.  EXAM: CHEST  2 VIEW   COMPARISON:  04/13/2011  FINDINGS: Heart size appears normal. Small bilateral pleural effusions are identified. There is mild interstitial edema and decreased aeration of both lung bases. The visualized osseous structures are unremarkable.  IMPRESSION: 1. Mild congestive heart failure is suspected.   Electronically Signed   By: Signa Kell M.D.   On: 02/08/2015 18:52   Ct Abdomen Pelvis W Contrast  02/08/2015   CLINICAL DATA:  Abdominal pain for 2 weeks  EXAM: CT ABDOMEN AND PELVIS WITH CONTRAST  TECHNIQUE: Multidetector CT imaging of the abdomen and pelvis was performed using the standard protocol following bolus administration of intravenous contrast.  CONTRAST:  OMNIPAQUE IOHEXOL 300 MG/ML  SOLN  COMPARISON:  None.  FINDINGS: There is small right pleural effusion. Small loculated left antral lateral pleural effusion. Mild atelectasis noted bilateral lower lobe.  Sagittal images of the spine are unremarkable.  Atherosclerotic calcifications of abdominal aorta and iliac arteries are noted.  Liver, pancreas, spleen and adrenal glands are unremarkable.  Kidneys are symmetrical in size and enhancement. No hydronephrosis or hydroureter.  No aortic aneurysm.  Delayed renal images shows bilateral renal symmetrical excretion.  No small bowel obstruction. No ascites or free air. No adenopathy. The uterus is unremarkable. There is no adnexal mass. No pericecal inflammation. The patient is status post appendectomy. The terminal ileum is unremarkable.  IMPRESSION: 1. There is small right pleural effusion. Small loculated left pleural effusion. Bilateral lower lobe posterior atelectasis. 2. No acute inflammatory process within abdomen or pelvis. 3. No hydronephrosis or hydroureter. 4. No pericecal inflammation.  Status post appendectomy. 5. Unremarkable uterus and adnexa.   Electronically Signed   By: Natasha Mead M.D.   On: 02/08/2015 19:45     EKG Interpretation   Date/Time:  Friday February 08 2015 17:08:14  EDT Ventricular Rate:  103 PR Interval:  125 QRS Duration: 74 QT Interval:  341 QTC Calculation: 446 R Axis:   87 Text Interpretation:  Sinus tachycardia Multiform ventricular premature  complexes Probable left atrial enlargement Consider right ventricular  hypertrophy SINCE LAST TRACING HEART RATE HAS INCREASED Confirmed by  Mirian Mo [phone]) on 02/08/2015 6:09:04 PM      MDM   Final diagnoses:  Abdominal pain, acute  Chest pain  Tachycardia  Volume overload  Pleural effusion   Jocelyn Shelton presents with bilateral rib pain, abdominal pain that radiates into her chest. Patient is a poor historian and it is difficult to elicit symptoms in onset from her. Patient has a history of scleroderma and this is likely the  reason for her abdominal firmness. Doubt surgical abdomen. We will obtain workup including chest x-ray, lab work, troponin and CT abdomen.  The patient was discussed with and seen by Dr. Littie Deeds who agrees with the treatment plan.  Lactic acid within normal limits, troponin normal. No leukocytosis. Normal kidney function.  Chest x-ray with mild congestive heart failure. Patient does not have symptoms of this. She denies shortness of breath. CT abdomen pending. Patient continues to complain of pain.  9:10 PM CT abdomen with small right pleural effusion and loculated left pleural effusion without inflammatory process in the abdomen or pelvis. Patient's BNP has resulted and is elevated.  Patient now tachycardia not present upon initial evaluation. Increasing concern for potential PE. Patient is unable to have repeat dye load. Discussed with radiology who recommends overnight admission and CT angio chest tomorrow. Patient has been given Lovenox.    9:59 PM Discussed with Family medicine who will admit.  Pt's lateral chest pain is likely due to her pleural effusion; however tele floor requests that pt be admitted to stepdown.  Home medications given for her HTN.    BP  175/90 mmHg  Pulse 96  Temp(Src) 99.9 F (37.7 C) (Oral)  Resp 28  SpO2 89%   The patient was discussed with and seen by Dr. Littie Deeds who agrees with the treatment plan.    Dahlia Client Martavis Gurney, PA-C 02/08/15 2202  Mirian Mo, MD 02/09/15 [phone]

## 2015-02-09 ENCOUNTER — Observation Stay (HOSPITAL_COMMUNITY): Payer: Medicaid Other

## 2015-02-09 ENCOUNTER — Encounter (HOSPITAL_COMMUNITY): Payer: Self-pay

## 2015-02-09 DIAGNOSIS — E8770 Fluid overload, unspecified: Secondary | ICD-10-CM | POA: Insufficient documentation

## 2015-02-09 DIAGNOSIS — R339 Retention of urine, unspecified: Secondary | ICD-10-CM | POA: Diagnosis present

## 2015-02-09 DIAGNOSIS — R079 Chest pain, unspecified: Secondary | ICD-10-CM | POA: Diagnosis not present

## 2015-02-09 DIAGNOSIS — M069 Rheumatoid arthritis, unspecified: Secondary | ICD-10-CM | POA: Diagnosis present

## 2015-02-09 DIAGNOSIS — M79604 Pain in right leg: Secondary | ICD-10-CM | POA: Diagnosis present

## 2015-02-09 DIAGNOSIS — I272 Other secondary pulmonary hypertension: Secondary | ICD-10-CM | POA: Diagnosis present

## 2015-02-09 DIAGNOSIS — Z91018 Allergy to other foods: Secondary | ICD-10-CM | POA: Diagnosis not present

## 2015-02-09 DIAGNOSIS — M79605 Pain in left leg: Secondary | ICD-10-CM | POA: Diagnosis present

## 2015-02-09 DIAGNOSIS — F339 Major depressive disorder, recurrent, unspecified: Secondary | ICD-10-CM | POA: Diagnosis present

## 2015-02-09 DIAGNOSIS — J309 Allergic rhinitis, unspecified: Secondary | ICD-10-CM | POA: Diagnosis present

## 2015-02-09 DIAGNOSIS — R0902 Hypoxemia: Secondary | ICD-10-CM | POA: Insufficient documentation

## 2015-02-09 DIAGNOSIS — Z888 Allergy status to other drugs, medicaments and biological substances status: Secondary | ICD-10-CM | POA: Diagnosis not present

## 2015-02-09 DIAGNOSIS — G894 Chronic pain syndrome: Secondary | ICD-10-CM | POA: Diagnosis present

## 2015-02-09 DIAGNOSIS — Z79899 Other long term (current) drug therapy: Secondary | ICD-10-CM | POA: Diagnosis not present

## 2015-02-09 DIAGNOSIS — M1990 Unspecified osteoarthritis, unspecified site: Secondary | ICD-10-CM | POA: Diagnosis present

## 2015-02-09 DIAGNOSIS — M349 Systemic sclerosis, unspecified: Secondary | ICD-10-CM | POA: Diagnosis present

## 2015-02-09 DIAGNOSIS — R100 Acute abdomen: Secondary | ICD-10-CM

## 2015-02-09 DIAGNOSIS — R0781 Pleurodynia: Secondary | ICD-10-CM | POA: Diagnosis not present

## 2015-02-09 DIAGNOSIS — Z6824 Body mass index (BMI) 24.0-24.9, adult: Secondary | ICD-10-CM | POA: Diagnosis not present

## 2015-02-09 DIAGNOSIS — I509 Heart failure, unspecified: Secondary | ICD-10-CM | POA: Diagnosis present

## 2015-02-09 DIAGNOSIS — J90 Pleural effusion, not elsewhere classified: Secondary | ICD-10-CM | POA: Diagnosis present

## 2015-02-09 DIAGNOSIS — Z8249 Family history of ischemic heart disease and other diseases of the circulatory system: Secondary | ICD-10-CM | POA: Diagnosis not present

## 2015-02-09 DIAGNOSIS — R109 Unspecified abdominal pain: Secondary | ICD-10-CM | POA: Insufficient documentation

## 2015-02-09 DIAGNOSIS — E876 Hypokalemia: Secondary | ICD-10-CM | POA: Diagnosis not present

## 2015-02-09 DIAGNOSIS — K219 Gastro-esophageal reflux disease without esophagitis: Secondary | ICD-10-CM | POA: Diagnosis present

## 2015-02-09 DIAGNOSIS — I7300 Raynaud's syndrome without gangrene: Secondary | ICD-10-CM | POA: Diagnosis present

## 2015-02-09 DIAGNOSIS — R590 Localized enlarged lymph nodes: Secondary | ICD-10-CM | POA: Diagnosis present

## 2015-02-09 DIAGNOSIS — F17210 Nicotine dependence, cigarettes, uncomplicated: Secondary | ICD-10-CM | POA: Diagnosis present

## 2015-02-09 DIAGNOSIS — I081 Rheumatic disorders of both mitral and tricuspid valves: Secondary | ICD-10-CM | POA: Diagnosis present

## 2015-02-09 DIAGNOSIS — R634 Abnormal weight loss: Secondary | ICD-10-CM | POA: Diagnosis present

## 2015-02-09 DIAGNOSIS — I10 Essential (primary) hypertension: Secondary | ICD-10-CM | POA: Diagnosis present

## 2015-02-09 DIAGNOSIS — R0609 Other forms of dyspnea: Secondary | ICD-10-CM | POA: Diagnosis present

## 2015-02-09 DIAGNOSIS — I493 Ventricular premature depolarization: Secondary | ICD-10-CM | POA: Diagnosis present

## 2015-02-09 LAB — URINALYSIS, ROUTINE W REFLEX MICROSCOPIC
Bilirubin Urine: NEGATIVE
GLUCOSE, UA: NEGATIVE mg/dL
Hgb urine dipstick: NEGATIVE
Ketones, ur: NEGATIVE mg/dL
LEUKOCYTES UA: NEGATIVE
NITRITE: NEGATIVE
PH: 7 (ref 5.0–8.0)
Protein, ur: NEGATIVE mg/dL
Urobilinogen, UA: 1 mg/dL (ref 0.0–1.0)

## 2015-02-09 LAB — PROTIME-INR
INR: 1.23 (ref 0.00–1.49)
Prothrombin Time: 15.6 seconds — ABNORMAL HIGH (ref 11.6–15.2)

## 2015-02-09 LAB — COMPREHENSIVE METABOLIC PANEL
ALT: 19 U/L (ref 14–54)
AST: 28 U/L (ref 15–41)
Albumin: 2.8 g/dL — ABNORMAL LOW (ref 3.5–5.0)
Alkaline Phosphatase: 73 U/L (ref 38–126)
Anion gap: 7 (ref 5–15)
BUN: <5 mg/dL — ABNORMAL LOW (ref 6–20)
CALCIUM: 8.1 mg/dL — AB (ref 8.9–10.3)
CHLORIDE: 98 mmol/L — AB (ref 101–111)
CO2: 29 mmol/L (ref 22–32)
CREATININE: 0.79 mg/dL (ref 0.44–1.00)
GLUCOSE: 98 mg/dL (ref 65–99)
POTASSIUM: 3.2 mmol/L — AB (ref 3.5–5.1)
SODIUM: 134 mmol/L — AB (ref 135–145)
Total Bilirubin: 0.5 mg/dL (ref 0.3–1.2)
Total Protein: 6.7 g/dL (ref 6.5–8.1)

## 2015-02-09 LAB — CBC
HCT: 34 % — ABNORMAL LOW (ref 36.0–46.0)
Hemoglobin: 10.9 g/dL — ABNORMAL LOW (ref 12.0–15.0)
MCH: 31.1 pg (ref 26.0–34.0)
MCHC: 32.1 g/dL (ref 30.0–36.0)
MCV: 97.1 fL (ref 78.0–100.0)
PLATELETS: 252 10*3/uL (ref 150–400)
RBC: 3.5 MIL/uL — AB (ref 3.87–5.11)
RDW: 14 % (ref 11.5–15.5)
WBC: 6.2 10*3/uL (ref 4.0–10.5)

## 2015-02-09 LAB — MRSA PCR SCREENING: MRSA BY PCR: NEGATIVE

## 2015-02-09 LAB — TROPONIN I: Troponin I: <0.03 ng/mL (ref <0.031)

## 2015-02-09 LAB — APTT: aPTT: 41 seconds — ABNORMAL HIGH (ref 24–37)

## 2015-02-09 LAB — LACTATE DEHYDROGENASE: LDH: 184 U/L (ref 98–192)

## 2015-02-09 MED ORDER — IOHEXOL 350 MG/ML SOLN
100.0000 mL | Freq: Once | INTRAVENOUS | Status: AC | PRN
  Administered 2015-02-09: 100 mL via INTRAVENOUS

## 2015-02-09 MED ORDER — FLUTICASONE PROPIONATE 50 MCG/ACT NA SUSP: 2.0000 | Freq: Every day | NASAL | Status: DC | PRN

## 2015-02-09 MED ORDER — HYDROCHLOROTHIAZIDE 25 MG PO TABS
25.0000 mg | ORAL_TABLET | Freq: Every day | ORAL | Status: DC
  Administered 2015-02-09 – 2015-02-11 (×3): 25 mg via ORAL
  Filled 2015-02-09 (×3): qty 1

## 2015-02-09 MED ORDER — IPRATROPIUM-ALBUTEROL 0.5-2.5 (3) MG/3ML IN SOLN: 3.0000 mL | Freq: Four times a day (QID) | RESPIRATORY_TRACT | Status: DC

## 2015-02-09 MED ORDER — INDOMETHACIN 25 MG PO CAPS
25.0000 mg | ORAL_CAPSULE | Freq: Three times a day (TID) | ORAL | Status: DC
  Administered 2015-02-09 – 2015-02-11 (×9): 25 mg via ORAL
  Filled 2015-02-09 (×11): qty 1

## 2015-02-09 MED ORDER — ENOXAPARIN SODIUM 40 MG/0.4ML ~~LOC~~ SOLN: 40.0000 mg | SUBCUTANEOUS | Status: DC

## 2015-02-09 MED ORDER — VERAPAMIL HCL 80 MG PO TABS
80.0000 mg | ORAL_TABLET | Freq: Three times a day (TID) | ORAL | Status: DC
  Administered 2015-02-09 – 2015-02-11 (×8): 80 mg via ORAL
  Filled 2015-02-09 (×9): qty 1

## 2015-02-09 MED ORDER — IPRATROPIUM-ALBUTEROL 0.5-2.5 (3) MG/3ML IN SOLN
3.0000 mL | Freq: Four times a day (QID) | RESPIRATORY_TRACT | Status: DC
  Administered 2015-02-09 – 2015-02-10 (×5): 3 mL via RESPIRATORY_TRACT
  Filled 2015-02-09 (×5): qty 3

## 2015-02-09 MED ORDER — METOPROLOL TARTRATE 50 MG PO TABS
50.0000 mg | ORAL_TABLET | Freq: Two times a day (BID) | ORAL | Status: DC
  Administered 2015-02-09 – 2015-02-11 (×6): 50 mg via ORAL
  Filled 2015-02-09 (×7): qty 1

## 2015-02-09 MED ORDER — POTASSIUM CHLORIDE CRYS ER 20 MEQ PO TBCR
40.0000 meq | EXTENDED_RELEASE_TABLET | Freq: Two times a day (BID) | ORAL | Status: AC
  Administered 2015-02-09 (×2): 40 meq via ORAL
  Filled 2015-02-09 (×2): qty 2

## 2015-02-09 MED ORDER — ENSURE ENLIVE PO LIQD
237.0000 mL | Freq: Two times a day (BID) | ORAL | Status: DC
  Administered 2015-02-10 – 2015-02-11 (×4): 237 mL via ORAL

## 2015-02-09 MED ORDER — SODIUM CHLORIDE 0.9 % IV SOLN
INTRAVENOUS | Status: DC
  Administered 2015-02-09 – 2015-02-10 (×4): via INTRAVENOUS

## 2015-02-09 MED ORDER — CETYLPYRIDINIUM CHLORIDE 0.05 % MT LIQD
7.0000 mL | Freq: Two times a day (BID) | OROMUCOSAL | Status: DC
  Administered 2015-02-09 – 2015-02-10 (×3): 7 mL via OROMUCOSAL

## 2015-02-09 MED ORDER — PANTOPRAZOLE SODIUM 40 MG PO TBEC
40.0000 mg | DELAYED_RELEASE_TABLET | Freq: Every day | ORAL | Status: DC
  Administered 2015-02-09 – 2015-02-11 (×3): 40 mg via ORAL
  Filled 2015-02-09 (×3): qty 1

## 2015-02-09 MED ORDER — ACETAMINOPHEN 500 MG PO TABS
1000.0000 mg | ORAL_TABLET | Freq: Every day | ORAL | Status: DC | PRN
  Administered 2015-02-09 – 2015-02-10 (×2): 1000 mg via ORAL
  Filled 2015-02-09 (×2): qty 2

## 2015-02-09 MED ORDER — OLANZAPINE 5 MG PO TABS
5.0000 mg | ORAL_TABLET | Freq: Every day | ORAL | Status: DC
  Administered 2015-02-09 – 2015-02-10 (×3): 5 mg via ORAL
  Filled 2015-02-09 (×4): qty 1

## 2015-02-09 MED ORDER — BISMUTH SUBSALICYLATE 262 MG/15ML PO SUSP: 30.0000 mL | Freq: Four times a day (QID) | ORAL | Status: DC | PRN

## 2015-02-09 MED ORDER — PNEUMOCOCCAL 13-VAL CONJ VACC IM SUSP: 0.5000 mL | INTRAMUSCULAR | Status: DC | PRN

## 2015-02-09 MED ORDER — FOLIC ACID 1 MG PO TABS
1.0000 mg | ORAL_TABLET | Freq: Every day | ORAL | Status: DC
  Administered 2015-02-09 – 2015-02-11 (×3): 1 mg via ORAL
  Filled 2015-02-09 (×3): qty 1

## 2015-02-09 MED ORDER — SODIUM CHLORIDE 0.9 % IJ SOLN
3.0000 mL | Freq: Two times a day (BID) | INTRAMUSCULAR | Status: DC
  Administered 2015-02-09 – 2015-02-11 (×5): 3 mL via INTRAVENOUS

## 2015-02-09 MED ORDER — IPRATROPIUM BROMIDE 0.02 % IN SOLN: 2.5000 mL | RESPIRATORY_TRACT | Status: DC | PRN

## 2015-02-09 MED ORDER — PNEUMOCOCCAL VAC POLYVALENT 25 MCG/0.5ML IJ INJ: 0.5000 mL | INJECTION | INTRAMUSCULAR | Status: DC

## 2015-02-09 NOTE — Progress Notes (Signed)
Family Medicine Teaching Service Daily Progress Note Intern Pager: [phone]  Patient name: Jocelyn Shelton Medical record number: [id number] Date of birth: [birth year] Age: 59 y.o. Gender: female  Primary Care Provider: Saralyn Pilar, DO Consultants: none Code Status: FULL  Assessment and Plan: Jocelyn Shelton is a 59 y.o. female presenting with chest and abdominal pain . PMH is significant for HTN, GERD, RA, Raynaud's syndrome, RA, and scleroderma.   Chief Complaint: chest and abdominal pain  Assessment and Plan: Jocelyn Shelton is a 59 y.o. female presenting with chest and abdominal pain. PMH is significant for HTN, GERD, RA, and scleroderma.   Chest and abdominal pain - CT abd in ED showed small R pleural effusion and loculated L pleural effusion without inflammatory process. BNP in ED 236. CXR suggestive of mild CHF (patient reports no prior diagnosis). Patient tachycardic (HR 103) and tachypneic (highest RR 28) in ED. WBC 6.9, afebrile, LA 1.8. Abdominal pain not surgical. H/o appendectomy, no SBO and negative CT abd/pelvis. More concerned about pleuritic-type chest pain. Wells for PE is only 1 as alternative Dx also likely and absolutely NO signs/RFs for DVT.  - CTA chest today; could not perform overnight due to increased dye load after CT abd in ED. No V/Q scan with lung findings on imaging. - Inpatient echo today. Equivocal BNP 236 and interstitial edema on CXR. No clinical stigmata or previous dx of CHF. No echo on file and may be helpful given possibility of pulmonary HTN. ?compensated cor pulmonale.  - Dilaudid PRN - Trop today <0.03 - EKG today sinus rhythm with PVCs and PACs  Pleural effusions: R > L seen on CT abd, consistent with exam and associated with dry cough and dyspnea on exertion in the setting of long term tx with MTX for RA/scleroderma. Possibly due to rheumatoid pneumonitis/ILD vs. MTX-induced lung dz/interstitial pneumonitis vs. PE. Malignancy in this long  time smoker not ruled out. Not septic and do not suspect PNA/empyema.  - Consider diagnostic thoracentesis; pt was NPO overnight. Also consider pulmonology consult +/- HRCT.  - Patient on 2L O2 [place]; ween as tolerated - Scheduled Duonebs given expiratory wheezes this AM.   Rheumatoid arthritis: Followed by Dr. Kellie Simmering, on MTX, folate, and prednisone prn flares.  - Continue indomethacin and a little over maintenance IVF (SCr 0.82 likely borderline elevated for degree of muscle mass) - Continue folate. MTX is Mon, Tues.   HTN - BP 196/106 in ED. Patient did not take usual BP meds on day of admission.  - Monitor  - Restart home medications metoprolol, HCTZ, and verapamil (?adjuntive tx for reynaud's)  Urinary retention - patient reports difficulty beginning urinary stream recently. Was unable to provide urine sample in the ED. Bladder scan showed 300 mL. Not sure why the difficulty voiding. No anticholinergics.  - Monitor - UA showed no signs of infection  Use of black cohosh - patient says she takes supplement twice a day when she gets "too hot." Can lead to hepatic SE.  - AST, ALT, tbili WNL - INR in AM  Hypokalemia - K 3.2 today (07/16) - Replete with K Dur  FEN/GI: NPO w/sips with meds for possible procedure. Heart healthy diet afterwards.  Prophylaxis: Lovenox  Disposition: home pending medical improvement   FEN/GI: NPO Prophylaxis: Lovenox  Disposition: telemetry  Subjective:   Jocelyn Shelton reports that she is feeling much better today. She still has some flank pain bilaterally but states that has improved as well. She denies chest pain,  and denies difficulty breathing. She says she feels ready to go home.   Objective: Temp:  [98.1 F (36.7 C)-99.9 F (37.7 C)] 98.8 F (37.1 C) (07/16 0816) Pulse Rate:  [84-108] 87 (07/16 0816) Resp:  [12-30] 20 (07/16 0816) BP: (142-197)/(70-112) 142/70 mmHg (07/16 0816) SpO2:  [89 %-100 %] 100 % (07/16 0823) Weight:  [150 lb  5.7 oz (68.2 kg)] 150 lb 5.7 oz (68.2 kg) (07/16 0340)  Physical Exam: General: pleasant frail-appearing elderly female lying in bed in NAD, on 2L O2 Cardiovascular: RRR, no murmurs appreciated Respiratory: prominent end expiratory wheezing, diminished breath sounds Abdomen: soft, non-tender, non-distended, +BS Neuro: A&Ox3, no focal deficits Psych: appropriate mood and affect   Laboratory:  Recent Labs Lab 02/08/15 1746 02/09/15 0306  WBC 6.9 6.2  HGB 12.3 10.9*  HCT 37.2 34.0*  PLT 248 252    Recent Labs Lab 02/08/15 1746 02/09/15 0306  NA 135 134*  K 3.6 3.2*  CL 99* 98*  CO2 27 29  BUN <5* <5*  CREATININE 0.82 0.79  CALCIUM 8.4* 8.1*  PROT 7.3 6.7  BILITOT 0.5 0.5  ALKPHOS 87 73  ALT 21 19  AST 34 28  GLUCOSE 101* 98    Imaging/Diagnostic Tests: Dg Chest 2 View  02/08/2015   CLINICAL DATA:  Weakness, chest pain and abdominal pain.  EXAM: CHEST  2 VIEW  COMPARISON:  04/13/2011  FINDINGS: Heart size appears normal. Small bilateral pleural effusions are identified. There is mild interstitial edema and decreased aeration of both lung bases. The visualized osseous structures are unremarkable.  IMPRESSION: 1. Mild congestive heart failure is suspected.   Electronically Signed   By: Signa Kell M.D.   On: 02/08/2015 18:52   Ct Abdomen Pelvis W Contrast  02/08/2015   CLINICAL DATA:  Abdominal pain for 2 weeks  EXAM: CT ABDOMEN AND PELVIS WITH CONTRAST  TECHNIQUE: Multidetector CT imaging of the abdomen and pelvis was performed using the standard protocol following bolus administration of intravenous contrast.  CONTRAST:  OMNIPAQUE IOHEXOL 300 MG/ML  SOLN  COMPARISON:  None.  FINDINGS: There is small right pleural effusion. Small loculated left antral lateral pleural effusion. Mild atelectasis noted bilateral lower lobe.  Sagittal images of the spine are unremarkable.  Atherosclerotic calcifications of abdominal aorta and iliac arteries are noted.  Liver, pancreas,  spleen and adrenal glands are unremarkable.  Kidneys are symmetrical in size and enhancement. No hydronephrosis or hydroureter.  No aortic aneurysm.  Delayed renal images shows bilateral renal symmetrical excretion.  No small bowel obstruction. No ascites or free air. No adenopathy. The uterus is unremarkable. There is no adnexal mass. No pericecal inflammation. The patient is status post appendectomy. The terminal ileum is unremarkable.  IMPRESSION: 1. There is small right pleural effusion. Small loculated left pleural effusion. Bilateral lower lobe posterior atelectasis. 2. No acute inflammatory process within abdomen or pelvis. 3. No hydronephrosis or hydroureter. 4. No pericecal inflammation.  Status post appendectomy. 5. Unremarkable uterus and adnexa.   Electronically Signed   By: Natasha Mead M.D.   On: 02/08/2015 19:45     Sarabi Sockwell Shelbie Hutching, MD 02/09/2015, 8:48 AM PGY-1, [facility] FPTS Intern pager: [phone], text pages welcome

## 2015-02-09 NOTE — Progress Notes (Addendum)
Initial Nutrition Assessment  DOCUMENTATION CODES:   Not applicable  INTERVENTION:   Ensure Enlive po BID, each supplement provides 350 kcal and 20 grams of protein  NUTRITION DIAGNOSIS:   Increased nutrient needs related to acute illness as evidenced by estimated needs  GOAL:   Patient will meet greater than or equal to 90% of their needs  MONITOR:   PO intake, Labs, Weight trends, I & O's, Supplement acceptance  REASON FOR ASSESSMENT:   Malnutrition Screening Tool  ASSESSMENT:  59 y.o. Female with a hx of HTN, GERD, RA, Scleroderma presents to the Emergency Department complaining of gradual, persistent, progressively worsening lateral chest and abdominal pain onset 2 weeks ago. Pt reports symptoms have significantly worsened in the last 3 days. Associated symptoms include abdominal pain that radiates into the bilateral anterior chest for the last 24 hours. Per EMS pt was found in the fetal position and crying in pain. Pt given Fentanyl 150mg  PTA with reduction in pain from a 10/10 to 5/10. Pt denies N/V/D, weakness, dizziness, syncope, dysuria, hematuria. Patient denies aggravating or alleviating factors. He reports subjective fever and chills at home. Pt denies any, neck pain, chest pain, shortness of breath, back pain.   Pt sleeping upon RD visit.  Per Malnutrition Screening Tool Report, pt eating poorly because of a decreased appetite and has had recent weight loss.  Per readings below, patient has had a 7% weight loss since May 2015.-- significant for time frame.  RD to add oral nutrition supplements.  RD unable to complete Nutrition Focused Physical Exam at this time.  Diet Order:  Diet Heart Room service appropriate?: Yes; Fluid consistency:: Thin  Skin:  Reviewed, no issues  Last BM:  Unknown  Height:   Ht Readings from Last 1 Encounters:  02/09/15 5\' 6"  (1.676 m)    Weight:   Wt Readings from Last 1 Encounters:  02/09/15 150 lb 5.7 oz (68.2 kg)     Ideal Body Weight:  59 kg  Wt Readings from Last 10 Encounters:  02/09/15 150 lb 5.7 oz (68.2 kg)  12/03/14 162 lb (73.483 kg)  03/26/14 167 lb 14.4 oz (76.159 kg)  05/23/13 171 lb (77.565 kg)  03/10/13 167 lb (75.751 kg)  03/01/13 167 lb 3.2 oz (75.841 kg)  01/31/13 173 lb (78.472 kg)  11/01/12 167 lb 12.8 oz (76.114 kg)  10/14/12 165 lb (74.844 kg)  09/28/12 170 lb (77.111 kg)    BMI:  Body mass index is 24.28 kg/(m^2).  Estimated Nutritional Needs:   Kcal:  1700-1900  Protein:  80-90 gm  Fluid:  1.7-1.9 L  EDUCATION NEEDS:   No education needs identified at this time  10/16/12, RD, LDN Pager #: [phone] After-Hours Pager #: [phone]

## 2015-02-10 ENCOUNTER — Inpatient Hospital Stay (HOSPITAL_COMMUNITY): Payer: Medicaid Other

## 2015-02-10 DIAGNOSIS — R0781 Pleurodynia: Secondary | ICD-10-CM

## 2015-02-10 DIAGNOSIS — J90 Pleural effusion, not elsewhere classified: Secondary | ICD-10-CM

## 2015-02-10 DIAGNOSIS — R079 Chest pain, unspecified: Secondary | ICD-10-CM

## 2015-02-10 LAB — PROTEIN, BODY FLUID: Total protein, fluid: 4 g/dL

## 2015-02-10 LAB — BODY FLUID CELL COUNT WITH DIFFERENTIAL
EOS FL: 7 %
Lymphs, Fluid: 77 %
MONOCYTE-MACROPHAGE-SEROUS FLUID: 11 % — AB (ref 50–90)
Neutrophil Count, Fluid: 4 % (ref 0–25)
Other Cells, Fluid: 1 %
Total Nucleated Cell Count, Fluid: 438 cu mm (ref 0–1000)

## 2015-02-10 LAB — CBC
HCT: 32.5 % — ABNORMAL LOW (ref 36.0–46.0)
HEMOGLOBIN: 10.4 g/dL — AB (ref 12.0–15.0)
MCH: 31 pg (ref 26.0–34.0)
MCHC: 32 g/dL (ref 30.0–36.0)
MCV: 97 fL (ref 78.0–100.0)
PLATELETS: 277 10*3/uL (ref 150–400)
RBC: 3.35 MIL/uL — ABNORMAL LOW (ref 3.87–5.11)
RDW: 13.7 % (ref 11.5–15.5)
WBC: 5.7 10*3/uL (ref 4.0–10.5)

## 2015-02-10 LAB — PH, BODY FLUID: pH, Fluid: 8

## 2015-02-10 LAB — BASIC METABOLIC PANEL
ANION GAP: 5 (ref 5–15)
BUN: <5 mg/dL — ABNORMAL LOW (ref 6–20)
CO2: 31 mmol/L (ref 22–32)
Calcium: 8.7 mg/dL — ABNORMAL LOW (ref 8.9–10.3)
Chloride: 103 mmol/L (ref 101–111)
Creatinine, Ser: 0.72 mg/dL (ref 0.44–1.00)
GFR calc Af Amer: >60 mL/min (ref >60)
GLUCOSE: 127 mg/dL — AB (ref 65–99)
Potassium: 4.2 mmol/L (ref 3.5–5.1)
SODIUM: 139 mmol/L (ref 135–145)

## 2015-02-10 LAB — LACTATE DEHYDROGENASE, PLEURAL OR PERITONEAL FLUID: LD FL: 361 U/L — AB (ref 3–23)

## 2015-02-10 LAB — GLUCOSE, SEROUS FLUID: Glucose, Fluid: 101 mg/dL

## 2015-02-10 LAB — GRAM STAIN

## 2015-02-10 LAB — C-REACTIVE PROTEIN: CRP: 15.4 mg/dL — ABNORMAL HIGH (ref <1.0)

## 2015-02-10 LAB — SEDIMENTATION RATE: SED RATE: 63 mm/h — AB (ref 0–22)

## 2015-02-10 LAB — CHOLESTEROL, TOTAL: Cholesterol: 119 mg/dL (ref 0–200)

## 2015-02-10 LAB — TSH: TSH: 3.792 u[IU]/mL (ref 0.350–4.500)

## 2015-02-10 LAB — HIV ANTIBODY (ROUTINE TESTING W REFLEX): HIV Screen 4th Generation wRfx: NONREACTIVE

## 2015-02-10 MED ORDER — IPRATROPIUM-ALBUTEROL 0.5-2.5 (3) MG/3ML IN SOLN: 3.0000 mL | RESPIRATORY_TRACT | Status: DC | PRN

## 2015-02-10 MED ORDER — ACETAMINOPHEN 325 MG PO TABS
650.0000 mg | ORAL_TABLET | Freq: Four times a day (QID) | ORAL | Status: DC | PRN
  Administered 2015-02-10 – 2015-02-11 (×2): 650 mg via ORAL
  Filled 2015-02-10 (×2): qty 2

## 2015-02-10 MED ORDER — LIDOCAINE HCL (PF) 1 % IJ SOLN
INTRAMUSCULAR | Status: AC
  Filled 2015-02-10: qty 10

## 2015-02-10 NOTE — Progress Notes (Signed)
Family Medicine Teaching Service Daily Progress Note Intern Pager: [phone]  Patient name: Jocelyn Shelton Medical record number: [id number] Date of birth: 06/06/1956 Age: 59 y.o. Gender: female  Primary Care Provider: Saralyn Pilar, DO Consultants: none Code Status: FULL  Assessment and Plan: Jocelyn Shelton is a 59 y.o. female presenting with chest and abdominal pain. PMH is significant for HTN, GERD, RA, and scleroderma.   Chest and abdominal pain - Likely 2/2 pleural effusions vs RA. CT abd in ED showed small R pleural effusion and loculated L pleural effusion without inflammatory process. BNP in ED 236. CXR suggestive of mild CHF (patient reports no prior diagnosis). Patient tachycardic (HR 103) and tachypneic (highest RR 28) in ED. - CTA chest without PE or other new findings - Inpatient echo pending given possibility of pulmonary HTN. - Trop today <0.03 - EKG today sinus rhythm with PVCs and PACs  Pleural effusions: R > L seen on CT abd, consistent with exam and associated with dry cough and dyspnea on exertion in the setting of long term tx with MTX for RA/scleroderma. Possibly due to rheumatoid pneumonitis/ILD vs. MTX-induced lung dz/interstitial pneumonitis vs. PE. Malignancy in this long time smoker not ruled out. Not septic and do not suspect PNA/empyema.  - s/p thoracentesis with LD 361 and protein 4.0 for 2 lights criteria - consistent with exudate, cultures pending - Successfully weaned to room air - Duonebs prn  Rheumatoid arthritis: Followed by Dr. Kellie Simmering, on MTX, folate, and prednisone prn flares.  - Continue indomethacin and a little over maintenance IVF (SCr 0.82 likely borderline elevated for degree of muscle mass) - Continue folate. MTX is Mon, Tues.   HTN - BP 196/106 in ED. Patient did not take usual BP meds on day of admission.  - Monitor  - Continue home medications metoprolol, HCTZ, and verapamil (?adjuntive tx for reynaud's)  Urinary retention  -resolved  Hypokalemia - K 3.2 today (07/16) - Replete with K Dur - f/u bmet pending  FEN/GI: heart healthy, SLIV Prophylaxis: Lovenox  Disposition: home pending medical improvement   Subjective: Feeling much better following thoracocentesis this am. Uncomplicated and now with minimal pain  Objective: Temp:  [97.8 F (36.6 C)-98.8 F (37.1 C)] 98.2 F (36.8 C) (07/17 0400) Pulse Rate:  [69-99] 77 (07/17 0400) Resp:  [13-24] 24 (07/17 0400) BP: (104-176)/(66-90) 104/68 mmHg (07/17 0400) SpO2:  [97 %-100 %] 100 % (07/17 0400) Weight:  [153 lb 14.1 oz (69.8 kg)] 153 lb 14.1 oz (69.8 kg) (07/17 0500)  Physical Exam: General: pleasant female lying in bed in NAD Cardiovascular: RRR, no murmurs appreciated Respiratory: CTAB, NWOB Abdomen: soft, non-tender, non-distended, +BS Neuro: A&Ox3, no focal deficits Psych: appropriate mood and affect   Laboratory:  Recent Labs Lab 02/08/15 1746 02/09/15 0306  WBC 6.9 6.2  HGB 12.3 10.9*  HCT 37.2 34.0*  PLT 248 252    Recent Labs Lab 02/08/15 1746 02/09/15 0306  NA 135 134*  K 3.6 3.2*  CL 99* 98*  CO2 27 29  BUN <5* <5*  CREATININE 0.82 0.79  CALCIUM 8.4* 8.1*  PROT 7.3 6.7  BILITOT 0.5 0.5  ALKPHOS 87 73  ALT 21 19  AST 34 28  GLUCOSE 101* 98    Imaging/Diagnostic Tests: Dg Chest 2 View  02/08/2015   CLINICAL DATA:  Weakness, chest pain and abdominal pain.  EXAM: CHEST  2 VIEW  COMPARISON:  04/13/2011  FINDINGS: Heart size appears normal. Small bilateral pleural effusions are identified. There is  mild interstitial edema and decreased aeration of both lung bases. The visualized osseous structures are unremarkable.  IMPRESSION: 1. Mild congestive heart failure is suspected.   Electronically Signed   By: Signa Kell M.D.   On: 02/08/2015 18:52   Ct Angio Chest Pe W/cm &/or Wo Cm  02/09/2015   CLINICAL DATA:  Dx. Pleural Effusions:R > L seen on CT abd, consistent with exam and associated with dry cough and dyspnea  on exertionFatigue Hx of Scleroderma Hx of Rheumatoid Arthritis Hx Long term smoker Hx HTN  EXAM: CT ANGIOGRAPHY CHEST WITH CONTRAST  TECHNIQUE: Multidetector CT imaging of the chest was performed using the standard protocol during bolus administration of intravenous contrast. Multiplanar CT image reconstructions and MIPs were obtained to evaluate the vascular anatomy.  CONTRAST:  OMNIPAQUE IOHEXOL 350 MG/ML SOLN  COMPARISON:  Chest radiograph, 02/08/2015.  Chest CT, 04/27/2011.  FINDINGS: Angiographic study: No evidence of a pulmonary embolus. Great vessels normal in caliber. No aortic dissection.  Thoracic inlet: No masses. Mildly prominent axillary lymph nodes bilaterally largest on the left measuring 1 cm short axis.  Mediastinum and hila: Heart mildly enlarged. Small amount pericardial fluid is noted. Mildly enlarged prevascular lymph node measures 12 mm in short axis. There are sub cm shotty right subcarinal lymph nodes. Increased soft tissue is noted along hila without a discrete enlarged lymph node or mass.  Lungs and pleural: Small bilateral pleural effusions, right larger than left. There are linear and reticular as well as mild hazy airspace opacities in the lower lobes and right middle lobe and left upper lobe lingula. Interstitial thickening is noted in the lower lungs. No discrete lung mass or suspicious nodule. No pneumothorax.  Limited upper abdomen:  Unremarkable.  Musculoskeletal: No fracture. No osteoblastic lesions. There are scattered ill-defined lucent lesions in the spine which are nonspecific.  Review of the MIP images confirms the above findings.  IMPRESSION: 1. No evidence of a pulmonary embolus. 2. Mild shotty mediastinal adenopathy. Prominent axillary lymph nodes. These are likely reactive given the patient's history of scleroderma and rheumatoid arthritis. 3. Small, right greater than left, pleural effusions. Small pericardial effusion. 4. Lower lung opacities which may all be due  to atelectasis. There is some interstitial thickening at the lung bases. Mild interstitial edema is suspected. No convincing pneumonia.   Electronically Signed   By: Amie Portland M.D.   On: 02/09/2015 10:13   Ct Abdomen Pelvis W Contrast  02/08/2015   CLINICAL DATA:  Abdominal pain for 2 weeks  EXAM: CT ABDOMEN AND PELVIS WITH CONTRAST  TECHNIQUE: Multidetector CT imaging of the abdomen and pelvis was performed using the standard protocol following bolus administration of intravenous contrast.  CONTRAST:  OMNIPAQUE IOHEXOL 300 MG/ML  SOLN  COMPARISON:  None.  FINDINGS: There is small right pleural effusion. Small loculated left antral lateral pleural effusion. Mild atelectasis noted bilateral lower lobe.  Sagittal images of the spine are unremarkable.  Atherosclerotic calcifications of abdominal aorta and iliac arteries are noted.  Liver, pancreas, spleen and adrenal glands are unremarkable.  Kidneys are symmetrical in size and enhancement. No hydronephrosis or hydroureter.  No aortic aneurysm.  Delayed renal images shows bilateral renal symmetrical excretion.  No small bowel obstruction. No ascites or free air. No adenopathy. The uterus is unremarkable. There is no adnexal mass. No pericecal inflammation. The patient is status post appendectomy. The terminal ileum is unremarkable.  IMPRESSION: 1. There is small right pleural effusion. Small loculated left pleural effusion.  Bilateral lower lobe posterior atelectasis. 2. No acute inflammatory process within abdomen or pelvis. 3. No hydronephrosis or hydroureter. 4. No pericecal inflammation.  Status post appendectomy. 5. Unremarkable uterus and adnexa.   Electronically Signed   By: Natasha Mead M.D.   On: 02/08/2015 19:45    Abram Sander, MD 02/10/2015, 7:44 AM PGY-3, [facility] FPTS Intern pager: [phone], text pages welcome

## 2015-02-10 NOTE — Progress Notes (Signed)
  Echocardiogram 2D Echocardiogram has been performed.  Delcie Roch 02/10/2015, 5:36 PM

## 2015-02-10 NOTE — Procedures (Signed)
   US guided Rt thora 200 cc yellow fluid Sent for labs  No fluid remaining  cxr pending Tolerated well

## 2015-02-11 DIAGNOSIS — K219 Gastro-esophageal reflux disease without esophagitis: Secondary | ICD-10-CM

## 2015-02-11 LAB — HEPATITIS C ANTIBODY: HCV AB: 0.1 {s_co_ratio} (ref 0.0–0.9)

## 2015-02-11 LAB — PATHOLOGIST SMEAR REVIEW: PATH REVIEW: REACTIVE

## 2015-02-11 MED ORDER — METHOTREXATE 2.5 MG PO TABS
12.5000 mg | ORAL_TABLET | ORAL | Status: DC
  Administered 2015-02-11: 12.5 mg via ORAL
  Filled 2015-02-11: qty 5

## 2015-02-11 MED ORDER — METHOTREXATE 2.5 MG PO TABS: 12.5000 mg | ORAL_TABLET | ORAL | Status: DC

## 2015-02-11 NOTE — Progress Notes (Signed)
Family Medicine Teaching Service Daily Progress Note Intern Pager: [phone]  Patient name: Jocelyn Shelton Medical record number: [id number] Date of birth: [birth year] Age: 59 y.o. Gender: female  Primary Care Provider: Saralyn Pilar, DO Consultants: None Code Status: FULL   Assessment and Plan: Jocelyn Shelton is a 59 y.o. female presenting with chest and abdominal pain. PMH is significant for HTN, GERD, RA, and scleroderma.   Chest and abdominal pain - Likely 2/2 pleural effusions vs RA. CT abd in ED showed small R pleural effusion and loculated L pleural effusion without inflammatory process. BNP in ED 236. CXR suggestive of mild CHF (patient reports no prior diagnosis). Patient tachycardic (HR 103) and tachypneic (highest RR 28) in ED. - CTA chest without PE or other new findings - Trop  <0.03 - EKG sinus rhythm with PVCs and PACs - Pain greatly relieved with thoracentesis - Echo (07/17) - mild pulmonary HTN and mild mitral and tricuspid regurg  Pleural effusions: R > L seen on CT abd, consistent with exam and associated with dry cough and dyspnea on exertion in the setting of long term tx with MTX for RA/scleroderma. Possibly due to rheumatoid pneumonitis/ILD vs. MTX-induced lung dz/interstitial pneumonitis vs. PE. Malignancy in this long time smoker not ruled out. Not septic and do not suspect PNA/empyema.  - s/p thoracentesis with LD 361 and protein 4.0 for 2 lights criteria - consistent with exudate, cultures pending - Successfully weaned to room air - Duonebs prn  Rheumatoid arthritis: Followed by Dr. Kellie Simmering, on MTX, folate, and prednisone prn flares.  - Continue indomethacin and a little over maintenance IVF (SCr 0.82 likely borderline elevated for degree of muscle mass) - Continue folate. MTX is Mon, Tues.   HTN - BP 196/106 in ED. Patient did not take usual BP meds on day of admission.  - Monitor  - Continue home medications metoprolol, HCTZ, and verapamil  (?adjuntive tx for reynaud's)  Urinary retention -Resolved  Hypokalemia - Resolved. K 4.2 today (07/18) - Monitor  FEN/GI: heart healthy, SLIV Prophylaxis: Lovenox  Disposition: home pending medical improvement   Subjective:  Jocelyn Shelton reports that she is feeling very well today. She denies flank or chest pain, and no difficulty breathing. She reports that her usual arthritis pain is bothering her this morning.   Objective: Temp:  [98 F (36.7 C)-98.7 F (37.1 C)] 98.5 F (36.9 C) (07/18 0504) Pulse Rate:  [73-94] 83 (07/18 0504) Resp:  [14-20] 16 (07/18 0504) BP: (131-189)/(80-101) 172/80 mmHg (07/18 0504) SpO2:  [95 %-100 %] 95 % (07/18 0504) Weight:  [151 lb 7.3 oz (68.7 kg)] 151 lb 7.3 oz (68.7 kg) (07/18 0449) Physical Exam: General: elderly lady in NAD lying in bed Cardiovascular: RRR, no murmurs appreciated Respiratory: CTAB, no wheezes Abdomen: soft, non-tender, non-distended, +BS MSK: no pain upon palpation  Neuro: A&Ox3, no focal deficits Psych: appropriate mood and affect   Laboratory:  Recent Labs Lab 02/08/15 1746 02/09/15 0306 02/10/15 1446  WBC 6.9 6.2 5.7  HGB 12.3 10.9* 10.4*  HCT 37.2 34.0* 32.5*  PLT 248 252 277    Recent Labs Lab 02/08/15 1746 02/09/15 0306 02/10/15 1446  NA 135 134* 139  K 3.6 3.2* 4.2  CL 99* 98* 103  CO2 27 29 31   BUN <5* <5* <5*  CREATININE 0.82 0.79 0.72  CALCIUM 8.4* 8.1* 8.7*  PROT 7.3 6.7  --   BILITOT 0.5 0.5  --   ALKPHOS 87 73  --   ALT 21  19  --   AST 34 28  --   GLUCOSE 101* 98 127*    Imaging/Diagnostic Tests: Dg Chest 1 View  02/10/2015   CLINICAL DATA:  Pleural effusion.  Post right thoracentesis  EXAM: CHEST  1 VIEW  COMPARISON:  02/09/2015  FINDINGS: Cardiomediastinal silhouette is stable. Persistent central mild vascular congestion. Small residual bilateral pleural effusion. Mild basilar atelectasis. There is no pneumothorax.  IMPRESSION: Persistent central vascular congestion. Small residual  bilateral pleural effusion with mild basilar atelectasis. There is no pneumothorax.   Electronically Signed   By: Natasha Mead M.D.   On: 02/10/2015 09:36   Dg Chest 2 View  02/08/2015   CLINICAL DATA:  Weakness, chest pain and abdominal pain.  EXAM: CHEST  2 VIEW  COMPARISON:  04/13/2011  FINDINGS: Heart size appears normal. Small bilateral pleural effusions are identified. There is mild interstitial edema and decreased aeration of both lung bases. The visualized osseous structures are unremarkable.  IMPRESSION: 1. Mild congestive heart failure is suspected.   Electronically Signed   By: Signa Kell M.D.   On: 02/08/2015 18:52   Ct Angio Chest Pe W/cm &/or Wo Cm  02/09/2015   CLINICAL DATA:  Dx. Pleural Effusions:R > L seen on CT abd, consistent with exam and associated with dry cough and dyspnea on exertionFatigue Hx of Scleroderma Hx of Rheumatoid Arthritis Hx Long term smoker Hx HTN  EXAM: CT ANGIOGRAPHY CHEST WITH CONTRAST  TECHNIQUE: Multidetector CT imaging of the chest was performed using the standard protocol during bolus administration of intravenous contrast. Multiplanar CT image reconstructions and MIPs were obtained to evaluate the vascular anatomy.  CONTRAST:  OMNIPAQUE IOHEXOL 350 MG/ML SOLN  COMPARISON:  Chest radiograph, 02/08/2015.  Chest CT, 04/27/2011.  FINDINGS: Angiographic study: No evidence of a pulmonary embolus. Great vessels normal in caliber. No aortic dissection.  Thoracic inlet: No masses. Mildly prominent axillary lymph nodes bilaterally largest on the left measuring 1 cm short axis.  Mediastinum and hila: Heart mildly enlarged. Small amount pericardial fluid is noted. Mildly enlarged prevascular lymph node measures 12 mm in short axis. There are sub cm shotty right subcarinal lymph nodes. Increased soft tissue is noted along hila without a discrete enlarged lymph node or mass.  Lungs and pleural: Small bilateral pleural effusions, right larger than left. There are linear  and reticular as well as mild hazy airspace opacities in the lower lobes and right middle lobe and left upper lobe lingula. Interstitial thickening is noted in the lower lungs. No discrete lung mass or suspicious nodule. No pneumothorax.  Limited upper abdomen:  Unremarkable.  Musculoskeletal: No fracture. No osteoblastic lesions. There are scattered ill-defined lucent lesions in the spine which are nonspecific.  Review of the MIP images confirms the above findings.  IMPRESSION: 1. No evidence of a pulmonary embolus. 2. Mild shotty mediastinal adenopathy. Prominent axillary lymph nodes. These are likely reactive given the patient's history of scleroderma and rheumatoid arthritis. 3. Small, right greater than left, pleural effusions. Small pericardial effusion. 4. Lower lung opacities which may all be due to atelectasis. There is some interstitial thickening at the lung bases. Mild interstitial edema is suspected. No convincing pneumonia.   Electronically Signed   By: Amie Portland M.D.   On: 02/09/2015 10:13   Ct Abdomen Pelvis W Contrast  02/08/2015   CLINICAL DATA:  Abdominal pain for 2 weeks  EXAM: CT ABDOMEN AND PELVIS WITH CONTRAST  TECHNIQUE: Multidetector CT imaging of the abdomen and pelvis  was performed using the standard protocol following bolus administration of intravenous contrast.  CONTRAST:  OMNIPAQUE IOHEXOL 300 MG/ML  SOLN  COMPARISON:  None.  FINDINGS: There is small right pleural effusion. Small loculated left antral lateral pleural effusion. Mild atelectasis noted bilateral lower lobe.  Sagittal images of the spine are unremarkable.  Atherosclerotic calcifications of abdominal aorta and iliac arteries are noted.  Liver, pancreas, spleen and adrenal glands are unremarkable.  Kidneys are symmetrical in size and enhancement. No hydronephrosis or hydroureter.  No aortic aneurysm.  Delayed renal images shows bilateral renal symmetrical excretion.  No small bowel obstruction. No ascites or  free air. No adenopathy. The uterus is unremarkable. There is no adnexal mass. No pericecal inflammation. The patient is status post appendectomy. The terminal ileum is unremarkable.  IMPRESSION: 1. There is small right pleural effusion. Small loculated left pleural effusion. Bilateral lower lobe posterior atelectasis. 2. No acute inflammatory process within abdomen or pelvis. 3. No hydronephrosis or hydroureter. 4. No pericecal inflammation.  Status post appendectomy. 5. Unremarkable uterus and adnexa.   Electronically Signed   By: Natasha Mead M.D.   On: 02/08/2015 19:45   US Thoracentesis Asp Pleural Space W/img Guide  02/10/2015   INDICATION: Symptomatic R sided pleural effusion  EXAM: US THORACENTESIS ASP PLEURAL SPACE W/IMG GUIDE  COMPARISON:  None.  MEDICATIONS: 8 cc 1% lidocaine  COMPLICATIONS: None immediate  TECHNIQUE: Informed written consent was obtained from the patient after a discussion of the risks, benefits and alternatives to treatment. A timeout was performed prior to the initiation of the procedure.  Initial ultrasound scanning demonstrates a Right pleural effusion. The lower chest was prepped and draped in the usual sterile fashion. 1% lidocaine was used for local anesthesia.  Under direct ultrasound guidance, a 19 gauge, 7-cm, Yueh catheter was introduced. An ultrasound image was saved for documentation purposes. The thoracentesis was performed. The catheter was removed and a dressing was applied. The patient tolerated the procedure well without immediate post procedural complication. The patient was escorted to have an upright chest radiograph.  FINDINGS: A total of approximately 200 cc of yellow fluid was removed. Requested samples were sent to the laboratory.  IMPRESSION: Successful ultrasound-guided R sided thoracentesis yielding 200 cc of pleural fluid.  Read by:  Ralene Muskrat [facility]   Electronically Signed   By: Malachy Moan M.D.   On: 02/10/2015 10:47     Abigail Shelbie Hutching, MD 02/11/2015, 7:28 AM PGY-1, [facility] FPTS Intern pager: [phone], text pages welcome

## 2015-02-11 NOTE — Discharge Instructions (Signed)
You were admitted for abdominal pain and chest pain. You were also found to have some fluid around your lungs called "pleural effusions." The fluid could have been caused by your rheumatoid arthritis, but we want to make sure it wasn't cause by anything more serious. Dr. Althea Charon (your primary doctor) will discuss further testing for this at your hospital follow-up appointment.   Please continue to take your regular home medications as prescribed before your hospitalization. It is important to make an appointment with your rheumatologist as soon as possible so you can get your methotrexate for next week. Also, please attend your follow-up appointment with Dr. Althea Charon on July 25 at 9:45 AM.

## 2015-02-11 NOTE — Progress Notes (Signed)
Pt. Given discharge papers  and education. Belongings returned, waiting for transportation.

## 2015-02-11 NOTE — H&P (Signed)
Family Medicine Teaching [facility] Admission History and Physical Service Pager: [phone]  Patient name: Jocelyn Shelton Medical record number: [id number] Date of birth: September 17, 1955 Age: 59 y.o. Gender: female  Primary Care Provider: Saralyn Pilar, DO Consultants: None Code Status: FULL  Chief Complaint: chest and abdominal pain  Assessment and Plan: Jocelyn Shelton is a 59 y.o. female presenting with chest and abdominal pain. PMH is significant for HTN, GERD, RA, and scleroderma.   Chest and abdominal pain - CT abd in ED showed small R pleural effusion and loculated L pleural effusion without inflammatory process. BNP in ED 236. CXR suggestive of mild CHF (patient reports no prior diagnosis). Patient tachycardic (HR 103) and tachypneic (highest RR 28) in ED. WBC 6.9, afebrile, LA 1.8. Abdominal pain not surgical. H/o appendectomy, no SBO and negative CT abd/pelvis. More concerned about pleuritic-type chest pain. Wells for PE is only 1 as alternative Dx also likely and absolutely NO signs/RFs for DVT.  - CTA chest in AM; could not perform overnight due to increased dye load after CT abd in ED. Will give IVF overnight and consider CTA and ABG if hypoxemia continues. No V/Q scan with lung findings on imaging. - Recommend echo inpt vs. outpt. Equivocal BNP 236 and interstitial edema on CXR. No clinical stigmata or previous dx of CHF. No echo on file and may be helpful given possibility of pulmonary HTN. ?compensated cor pulmonale.   - Check 1 more troponin, repeat ECG in AM (sinus tach and signs of RVH in ED) - Dilaudid PRN  Pleural effusions: R > L seen on CT abd, consistent with exam and associated with dry cough and dyspnea on exertion in the setting of long term tx with MTX for RA/scleroderma. Possibly due to rheumatoid pneumonitis/ILD vs. MTX-induced lung dz/interstitial pneumonitis vs. PE. Malignancy in this long time smoker not ruled out. Not septic and do not suspect  PNA/empyema.  - Consider diagnostic thoracentesis; will make NPO p MN and plan to check serum LDH, CMP in AM. Coags also ordered. Also consider pulmonology consult +/- HRCT.  - Oxygen by [place] prn - Therapeutic trial of duonebs given scattered wheezes. Long time smoker, no formal dx COPD.   Rheumatoid arthritis: Followed by Dr. Kellie Simmering, on MTX, folate, and prednisone prn flares.  - Start indomethacin and a little over maintenance IVF (SCr 0.82 likely borderline elevated for degree of muscle mass) - Continue folate. MTX is Mon, Tues.   HTN - BP 196/106 in ED. Patient did not take usual BP meds on day of admission.  - Home meds (Hydrodiuril) given in ED - Monitor  - Restart home medications metoprolol, HCTZ, and verapamil (?adjuntive tx for reynaud's)  Urinary retention - patient reports difficulty beginning urinary stream recently. Was unable to provide urine sample in the ED. Bladder scan showed 300 mL. Not sure why the difficulty voiding. No anticholinergics.  - In and out cath for UA. - Monitor  Use of black cohosh  - patient says she takes supplement twice a day when she gets "too hot." Can lead to hepatic SE.  - AST, ALT, tbili WNL - INR in AM  FEN/GI: NPO w/sips with meds overnight for possible procedure. Heart healthy diet afterwards.  Prophylaxis: Lovenox  Disposition: Admit to inpatient in SDU on FMTS, McDiarmid.   History of Present Illness: Jocelyn Shelton is a 59 y.o. female presenting with chest and abdominal pain.   Jocelyn Shelton reports that for the past few days she has had  pain everywhere, especially around her ribs on both side. She said the pain has been excruciating when coughing and "sticking pain" when taking a deep breath. Also with increasing SOB over the past week, especially on exertion. She reports an associated cough without sputum production. She also reports lower back muscle spasms and bilateral leg pain. She has been taking Tylenol and ibuprofen (about 200mg  each  day) over the past two days which did not alleviate her pain. She also reports chest pain in her midsternal region radiating to her umbilicus. She took Catering manager for this chest pain which did not relieve her symptoms either.   She denies any recent travel or hospitalizations, and does not know if she has ever had a blood clot. She doesn't know if she has a family history of clots. She reports recent unintentional weight loss which she attributes to lack of appetite. She does report difficulty initiating urinary stream recently, but denies dysuria.    She reports taking black cohosh twice a day if she gets "too hot." Did not take medications on day of presentation.   Poor historian with very nondescript symptoms and generalized complaints   Review Of Systems: Per HPI with the following additions: denies N/V/C/D, fevers, urinary incontinence; endorses fatigue Otherwise 12 point review of systems was performed and was unremarkable.  Patient Active Problem List   Diagnosis Date Noted  . Chest pain   . Hypoxia   . Abdominal pain, acute   . Volume overload   . Pleural effusion 02/08/2015  . Tachycardia   . Allergic rhinitis 12/03/2014  . Tendinitis of right rotator cuff 12/03/2014  . Chronic pain syndrome 12/03/2014  . Diabetes mellitus screening 03/26/2014  . Chronic foot pain 03/26/2014  . Plantar wart of right foot 03/26/2014  . Rheumatoid arthritis 03/14/2013  . Anemia 03/02/2013  . Other pancytopenia 03/01/2013  . Thrombocytopenia, unspecified 03/01/2013  . Facial rash 02/02/2012  . 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 Erythema nodosum 04/27/2011  . GASTROESOPHAGEAL REFLUX DISEASE 08/02/2008  . CORNS AND CALLOSITIES 03/18/2007  . Major depressive disorder, recurrent episode 09/23/2006  . Tobacco abuse 09/23/2006  . HYPERTENSION, BENIGN SYSTEMIC 09/23/2006  . RAYNAUDS SYNDROME 09/23/2006  . CLAUDICATION, INTERMITTENT 09/23/2006  . OSTEOARTHRITIS, MULTI SITES 09/23/2006   Past  Medical History: Past Medical History  Diagnosis Date  . Hypertension   . Reflux   . Scleroderma   . Rheumatoid arthritis(714.0)    Past Surgical History: Past Surgical History  Procedure Laterality Date  . Appendectomy  90's   Social History: History  Substance Use Topics  . Smoking status: Current Every Day Smoker -- 0.50 packs/day for 40 years    Types: Cigarettes  . Smokeless tobacco: Not on file  . Alcohol Use: 2.0 oz/week    4 drink(s) per week   Additional social history: none. Please also refer to relevant sections of EMR.  Family History: Family History  Problem Relation Age of Onset  . Hypertension Father   . Heart attack Mother   . Diabetes Daughter   . Diabetes Sister   . Lung cancer Brother 50   Allergies and Medications: Allergies  Allergen Reactions  . Aspirin Other (See Comments)    Stomach cramps  . Cymbalta [Duloxetine Hcl] Nausea Only    No appetite, stomach pain  . Tomato Rash   No current facility-administered medications on file prior to encounter.   Current Outpatient Prescriptions on File Prior to Encounter  Medication Sig Dispense Refill  . acetaminophen (TYLENOL)  500 MG tablet Take 1,000 mg by mouth daily as needed for pain.     . ATROVENT HFA 17 MCG/ACT inhaler INHALE TWO PUFFS BY MOUTH ONCE DAILY AS NEEDED FOR WHEEZING 13 g 2  . BLACK COHOSH PO Take 1 capsule by mouth 2 (two) times daily.    Marland Kitchen CALCIUM PO Take 1 tablet by mouth daily.    . cetirizine (ZYRTEC) 10 MG tablet Take 1 tablet (10 mg total) by mouth daily. 30 tablet 6  . fluticasone (FLONASE) 50 MCG/ACT nasal spray Place 2 sprays into both nostrils daily as needed for rhinitis. 16 g 1  . folic acid (FOLVITE) 1 MG tablet TAKE 1 TABLET (1 MG TOTAL) BY MOUTH DAILY. 30 tablet 6  . hydrochlorothiazide (HYDRODIURIL) 25 MG tablet Take 1 tablet (25 mg total) by mouth daily. 90 tablet 0  . meloxicam (MOBIC) 7.5 MG tablet Take 1 tablet (7.5 mg total) by mouth daily. Take for up to 2  weeks then stop and use as needed only. 30 tablet 1  . metoprolol (LOPRESSOR) 50 MG tablet TAKE ONE TABLET BY MOUTH TWICE DAILY 180 tablet 2  . Multiple Vitamin (MULTIVITAMIN WITH MINERALS) TABS tablet Take 1 tablet by mouth daily.    Marland Kitchen OLANZapine (ZYPREXA) 5 MG tablet Take 1 tablet (5 mg total) by mouth at bedtime. 30 tablet 11  . omeprazole (PRILOSEC) 40 MG capsule TAKE ONE CAPSULE BY MOUTH ONCE DAILY 90 capsule 2  . verapamil (CALAN) 80 MG tablet TAKE ONE TABLET BY MOUTH THREE TIMES DAILY 90 tablet 3  . DULoxetine (CYMBALTA) 60 MG capsule Take 1 capsule (60 mg total) by mouth daily. (Patient not taking: Reported on 02/08/2015) 30 capsule 2  . predniSONE (DELTASONE) 5 MG tablet Take 1 tablet (5 mg total) by mouth daily. Do not start taking until finished with taper down to 5 mg. (Patient not taking: Reported on 12/03/2014) 30 tablet 0    Objective: BP 140/73 mmHg  Pulse 72  Temp(Src) 98.1 F (36.7 C) (Oral)  Resp 22  Ht 5\' 6"  (1.676 m)  Wt 151 lb 7.3 oz (68.7 kg)  BMI 24.46 kg/m2  SpO2 99% Exam: General: frail cachectic-appearing elderly female lying in bed in NAD Eyes: PERRLA ENTM: dry/cracked lips Cardiovascular: tachycardic, no murmurs appreciated, no JVD, no pitting edema Respiratory: end expiratory wheezing, diminished breath sounds at both bases, very poor air movement on R side. Nonlabored 100% on 2L O2.  Abdomen: soft, generalized tenderness without rebound or guarding, non-distended, +BS MSK: negative Homan's sign Skin: no rashes or bruises noted Neuro: A&Ox3, no focal deficits, 5/5 strength LE bilaterally.  Psych: appropriate mood and affect low volume and rate of speech,   Labs and Imaging: CBC BMET   Recent Labs Lab 02/10/15 1446  WBC 5.7  HGB 10.4*  HCT 32.5*  PLT 277    Recent Labs Lab 02/10/15 1446  NA 139  K 4.2  CL 103  CO2 31  BUN <5*  CREATININE 0.72  GLUCOSE 127*  CALCIUM 8.7*     Dg Chest 1 View  02/10/2015   CLINICAL DATA:  Pleural  effusion.  Post right thoracentesis  EXAM: CHEST  1 VIEW  COMPARISON:  02/09/2015  FINDINGS: Cardiomediastinal silhouette is stable. Persistent central mild vascular congestion. Small residual bilateral pleural effusion. Mild basilar atelectasis. There is no pneumothorax.  IMPRESSION: Persistent central vascular congestion. Small residual bilateral pleural effusion with mild basilar atelectasis. There is no pneumothorax.   Electronically Signed   By: 02/11/2015  Pop M.D.   On: 02/10/2015 09:36   Dg Chest 2 View  02/08/2015   CLINICAL DATA:  Weakness, chest pain and abdominal pain.  EXAM: CHEST  2 VIEW  COMPARISON:  04/13/2011  FINDINGS: Heart size appears normal. Small bilateral pleural effusions are identified. There is mild interstitial edema and decreased aeration of both lung bases. The visualized osseous structures are unremarkable.  IMPRESSION: 1. Mild congestive heart failure is suspected.   Electronically Signed   By: Signa Kell M.D.   On: 02/08/2015 18:52   Ct Angio Chest Pe W/cm &/or Wo Cm  02/09/2015   CLINICAL DATA:  Dx. Pleural Effusions:R > L seen on CT abd, consistent with exam and associated with dry cough and dyspnea on exertionFatigue Hx of Scleroderma Hx of Rheumatoid Arthritis Hx Long term smoker Hx HTN  EXAM: CT ANGIOGRAPHY CHEST WITH CONTRAST  TECHNIQUE: Multidetector CT imaging of the chest was performed using the standard protocol during bolus administration of intravenous contrast. Multiplanar CT image reconstructions and MIPs were obtained to evaluate the vascular anatomy.  CONTRAST:  OMNIPAQUE IOHEXOL 350 MG/ML SOLN  COMPARISON:  Chest radiograph, 02/08/2015.  Chest CT, 04/27/2011.  FINDINGS: Angiographic study: No evidence of a pulmonary embolus. Great vessels normal in caliber. No aortic dissection.  Thoracic inlet: No masses. Mildly prominent axillary lymph nodes bilaterally largest on the left measuring 1 cm short axis.  Mediastinum and hila: Heart mildly enlarged. Small  amount pericardial fluid is noted. Mildly enlarged prevascular lymph node measures 12 mm in short axis. There are sub cm shotty right subcarinal lymph nodes. Increased soft tissue is noted along hila without a discrete enlarged lymph node or mass.  Lungs and pleural: Small bilateral pleural effusions, right larger than left. There are linear and reticular as well as mild hazy airspace opacities in the lower lobes and right middle lobe and left upper lobe lingula. Interstitial thickening is noted in the lower lungs. No discrete lung mass or suspicious nodule. No pneumothorax.  Limited upper abdomen:  Unremarkable.  Musculoskeletal: No fracture. No osteoblastic lesions. There are scattered ill-defined lucent lesions in the spine which are nonspecific.  Review of the MIP images confirms the above findings.  IMPRESSION: 1. No evidence of a pulmonary embolus. 2. Mild shotty mediastinal adenopathy. Prominent axillary lymph nodes. These are likely reactive given the patient's history of scleroderma and rheumatoid arthritis. 3. Small, right greater than left, pleural effusions. Small pericardial effusion. 4. Lower lung opacities which may all be due to atelectasis. There is some interstitial thickening at the lung bases. Mild interstitial edema is suspected. No convincing pneumonia.   Electronically Signed   By: Amie Portland M.D.   On: 02/09/2015 10:13   Ct Abdomen Pelvis W Contrast  02/08/2015   CLINICAL DATA:  Abdominal pain for 2 weeks  EXAM: CT ABDOMEN AND PELVIS WITH CONTRAST  TECHNIQUE: Multidetector CT imaging of the abdomen and pelvis was performed using the standard protocol following bolus administration of intravenous contrast.  CONTRAST:  OMNIPAQUE IOHEXOL 300 MG/ML  SOLN  COMPARISON:  None.  FINDINGS: There is small right pleural effusion. Small loculated left antral lateral pleural effusion. Mild atelectasis noted bilateral lower lobe.  Sagittal images of the spine are unremarkable.  Atherosclerotic  calcifications of abdominal aorta and iliac arteries are noted.  Liver, pancreas, spleen and adrenal glands are unremarkable.  Kidneys are symmetrical in size and enhancement. No hydronephrosis or hydroureter.  No aortic aneurysm.  Delayed renal images shows bilateral renal symmetrical  excretion.  No small bowel obstruction. No ascites or free air. No adenopathy. The uterus is unremarkable. There is no adnexal mass. No pericecal inflammation. The patient is status post appendectomy. The terminal ileum is unremarkable.  IMPRESSION: 1. There is small right pleural effusion. Small loculated left pleural effusion. Bilateral lower lobe posterior atelectasis. 2. No acute inflammatory process within abdomen or pelvis. 3. No hydronephrosis or hydroureter. 4. No pericecal inflammation.  Status post appendectomy. 5. Unremarkable uterus and adnexa.   Electronically Signed   By: Natasha Mead M.D.   On: 02/08/2015 19:45   US Thoracentesis Asp Pleural Space W/img Guide  02/10/2015   INDICATION: Symptomatic R sided pleural effusion  EXAM: US THORACENTESIS ASP PLEURAL SPACE W/IMG GUIDE  COMPARISON:  None.  MEDICATIONS: 8 cc 1% lidocaine  COMPLICATIONS: None immediate  TECHNIQUE: Informed written consent was obtained from the patient after a discussion of the risks, benefits and alternatives to treatment. A timeout was performed prior to the initiation of the procedure.  Initial ultrasound scanning demonstrates a Right pleural effusion. The lower chest was prepped and draped in the usual sterile fashion. 1% lidocaine was used for local anesthesia.  Under direct ultrasound guidance, a 19 gauge, 7-cm, Yueh catheter was introduced. An ultrasound image was saved for documentation purposes. The thoracentesis was performed. The catheter was removed and a dressing was applied. The patient tolerated the procedure well without immediate post procedural complication. The patient was escorted to have an upright chest radiograph.  FINDINGS: A  total of approximately 200 cc of yellow fluid was removed. Requested samples were sent to the laboratory.  IMPRESSION: Successful ultrasound-guided R sided thoracentesis yielding 200 cc of pleural fluid.  Read by:  Ralene Muskrat [facility]   Electronically Signed   By: Malachy Moan M.D.   On: 02/10/2015 10:47     Tyrone Nine, MD 02/11/2015, 4:56 PM PGY-1, [facility] FPTS Intern pager: [phone], text pages welcome   I have seen and evaluated the patient with Dr. Natale Milch. I am in agreement with the note above in its revised form. My additions are in red.  Kiandra Sanguinetti B. Jarvis Newcomer, MD, PGY-3 02/11/2015 4:56 PM

## 2015-02-11 NOTE — Discharge Summary (Signed)
Family Medicine Teaching [facility] Discharge Summary  Patient name: Jocelyn Shelton Medical record number: [id number] Date of birth: 1955-10-15 Age: 59 y.o. Gender: female Date of Admission: 02/08/2015  Date of Discharge: 02/11/2015 Admitting Physician: Leighton Roach McDiarmid, MD  Primary Care Provider: Saralyn Pilar, DO Consultants: None  Indication for Hospitalization: abdominal and chest pain   Discharge Diagnoses/Problem List:  Patient Active Problem List   Diagnosis Date Noted  . Chest pain   . Hypoxia   . Abdominal pain, acute   . Volume overload   . Pleural effusion 02/08/2015  . Tachycardia   . Allergic rhinitis 12/03/2014  . Tendinitis of right rotator cuff 12/03/2014  . Chronic pain syndrome 12/03/2014  . Diabetes mellitus screening 03/26/2014  . Chronic foot pain 03/26/2014  . Plantar wart of right foot 03/26/2014  . Rheumatoid arthritis 03/14/2013  . Anemia 03/02/2013  . Other pancytopenia 03/01/2013  . Thrombocytopenia, unspecified 03/01/2013  . Facial rash 02/02/2012  . 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 Erythema nodosum 04/27/2011  . GASTROESOPHAGEAL REFLUX DISEASE 08/02/2008  . CORNS AND CALLOSITIES 03/18/2007  . Major depressive disorder, recurrent episode 09/23/2006  . Tobacco abuse 09/23/2006  . HYPERTENSION, BENIGN SYSTEMIC 09/23/2006  . RAYNAUDS SYNDROME 09/23/2006  . CLAUDICATION, INTERMITTENT 09/23/2006  . OSTEOARTHRITIS, MULTI SITES 09/23/2006     Disposition: home  Discharge Condition: stable  Discharge Exam:  General: elderly lady in NAD lying in bed Cardiovascular: RRR, no murmurs appreciated Respiratory: CTAB, no wheezes Abdomen: soft, non-tender, non-distended, +BS MSK: no pain upon palpation  Neuro: A&Ox3, no focal deficits Psych: appropriate mood and affect   Brief Hospital Course:  Ms. Westendorf presented to the ED for chest and abdominal pain for multiple days. CT abd on admission showed small R pleural effusion and loculated  L pleural effusion without inflammatory process. CXR in ED was suggestive of mild CHF. The patient was started on Duonebs and Central, which improved her breathing quickly.  CTA chest was obtained the following day which showed no PE or acute abnormalities. It did show axillary and mediastinal adenopathy. She also received a thoracentesis, which greatly relieved her pain. Analysis of the fluid was consistent with exudate with no organisms seen. Echo on this day showed mild pulmonary HTN, another possible contributor to her pain.  Given her improved pain, Ms. Takacs was discharged. We discussed the results of her CT, as well as the importance of following up with her PCP for further work-up.    Issues for Follow Up:  1. In additional to bilateral pleural effusions, Ms. Redler was found to have mildly prominent axillary lymph nodes bilaterally and mild shotty mediastinal adenopathy. May be simply reactive due to patient's scleroderma and RA, but cannot r/u cancer (patient does have history of smoking). I discussed with patient the possibility of further imaging per PCP's recommendation.  2. Patient receives methotrexate on Mondays and Tuesdays. Since she missed a rheumatology appointment while hospitalized, she was prescribed enough MTX for this week. If unable to schedule a rheumatology appointment before PCP hospital f/u, she may need another dose.   Significant Procedures: Thoracentesis   Significant Labs and Imaging:  BNP on arrival 236 CT abd - small R pleural effusion and loculated L pleural effusion without inflammatory process CTA chest - no PE or other acute abnormalities EKG - sinus rhythm with PVCs and PACs CXR - findings suggestive of mild CHF  Recent Labs Lab 02/08/15 1746 02/09/15 0306 02/10/15 1446  WBC 6.9 6.2 5.7  HGB 12.3 10.9*  10.4*  HCT 37.2 34.0* 32.5*  PLT 248 252 277    Recent Labs Lab 02/08/15 1746 02/09/15 0306 02/10/15 1446  NA 135 134* 139  K 3.6 3.2* 4.2  CL  99* 98* 103  CO2 27 29 31   GLUCOSE 101* 98 127*  BUN <5* <5* <5*  CREATININE 0.82 0.79 0.72  CALCIUM 8.4* 8.1* 8.7*  ALKPHOS 87 73  --   AST 34 28  --   ALT 21 19  --   ALBUMIN 3.2* 2.8*  --      Results/Tests Pending at Time of Discharge: None   Discharge Medications:    Medication List    STOP taking these medications        BLACK COHOSH PO      TAKE these medications        acetaminophen 500 MG tablet  Commonly known as:  TYLENOL  Take 1,000 mg by mouth daily as needed for pain.     ATROVENT HFA 17 MCG/ACT inhaler  Generic drug:  ipratropium  INHALE TWO PUFFS BY MOUTH ONCE DAILY AS NEEDED FOR WHEEZING     bismuth subsalicylate 262 MG/15ML suspension  Commonly known as:  PEPTO BISMOL  Take 30 mLs by mouth every 6 (six) hours as needed for indigestion.     CALCIUM PO  Take 1 tablet by mouth daily.     cetirizine 10 MG tablet  Commonly known as:  ZYRTEC  Take 1 tablet (10 mg total) by mouth daily.     DULoxetine 60 MG capsule  Commonly known as:  CYMBALTA  Take 1 capsule (60 mg total) by mouth daily.     fluticasone 50 MCG/ACT nasal spray  Commonly known as:  FLONASE  Place 2 sprays into both nostrils daily as needed for rhinitis.     folic acid 1 MG tablet  Commonly known as:  FOLVITE  TAKE 1 TABLET (1 MG TOTAL) BY MOUTH DAILY.     hydrochlorothiazide 25 MG tablet  Commonly known as:  HYDRODIURIL  Take 1 tablet (25 mg total) by mouth daily.     ibuprofen 200 MG tablet  Commonly known as:  ADVIL,MOTRIN  Take 200 mg by mouth every 6 (six) hours as needed for mild pain or moderate pain.     meloxicam 7.5 MG tablet  Commonly known as:  MOBIC  Take 1 tablet (7.5 mg total) by mouth daily. Take for up to 2 weeks then stop and use as needed only.     methotrexate 2.5 MG tablet  Commonly known as:  RHEUMATREX  Take 5 tablets (12.5 mg total) by mouth 2 (two) times a week. Take 5 tablets every Mon and Tues. Caution:Chemotherapy. Protect from light.      metoprolol 50 MG tablet  Commonly known as:  LOPRESSOR  TAKE ONE TABLET BY MOUTH TWICE DAILY     multivitamin with minerals Tabs tablet  Take 1 tablet by mouth daily.     OLANZapine 5 MG tablet  Commonly known as:  ZYPREXA  Take 1 tablet (5 mg total) by mouth at bedtime.     omeprazole 40 MG capsule  Commonly known as:  PRILOSEC  TAKE ONE CAPSULE BY MOUTH ONCE DAILY     predniSONE 5 MG tablet  Commonly known as:  DELTASONE  Take 1 tablet (5 mg total) by mouth daily. Do not start taking until finished with taper down to 5 mg.     verapamil 80 MG tablet  Commonly known as:  CALAN  TAKE ONE TABLET BY MOUTH THREE TIMES DAILY        Discharge Instructions: Please refer to Patient Instructions section of EMR for full details.  Patient was counseled important signs and symptoms that should prompt return to medical care, changes in medications, dietary instructions, activity restrictions, and follow up appointments.   Follow-Up Appointments:  02/18/2015 9:45 AM - Dr. Althea Charon (PCP)   Marquette Saa, MD 02/11/2015, 5:06 PM PGY-1, [facility]

## 2015-02-11 NOTE — H&P (Signed)
Expand All Collapse All   Family Medicine Teaching [facility] Admission History and Physical Service Pager: [phone]  Patient name: Jocelyn Rinella HarrisMedical record number: [id number] Date of birth: [birth year]Age: 59 y.o.Gender: female  Primary Care Provider: Saralyn Pilar, DO Consultants: None Code Status: FULL  Chief Complaint: chest and abdominal pain  Assessment and Plan: Jocelyn Shelton is a 59 y.o. female presenting with chest and abdominal pain. PMH is significant for HTN, GERD, RA, and scleroderma.   Chest and abdominal pain - CT abd in ED showed small R pleural effusion and loculated L pleural effusion without inflammatory process. BNP in ED 236. CXR suggestive of mild CHF (patient reports no prior diagnosis). Patient tachycardic (HR 103) and tachypneic (highest RR 28) in ED. WBC 6.9, afebrile, LA 1.8. Abdominal pain not surgical. H/o appendectomy, no SBO and negative CT abd/pelvis. More concerned about pleuritic-type chest pain. Wells for PE is only 1 as alternative Dx also likely and absolutely NO signs/RFs for DVT.  - CTA chest in AM; could not perform overnight due to increased dye load after CT abd in ED. Will give IVF overnight and consider CTA and ABG if hypoxemia continues. No V/Q scan with lung findings on imaging. - Recommend echo inpt vs. outpt. Equivocal BNP 236 and interstitial edema on CXR. No clinical stigmata or previous dx of CHF. No echo on file and may be helpful given possibility of pulmonary HTN. ?compensated cor pulmonale.  - Check 1 more troponin, repeat ECG in AM (sinus tach and signs of RVH in ED) - Dilaudid PRN  Pleural effusions: R > L seen on CT abd, consistent with exam and associated with dry cough and dyspnea on exertion in the setting of long term tx with MTX for RA/scleroderma. Possibly due to rheumatoid pneumonitis/ILD vs. MTX-induced lung dz/interstitial pneumonitis vs. PE. Malignancy in this long time smoker not  ruled out. Not septic and do not suspect PNA/empyema.  - Consider diagnostic thoracentesis; will make NPO p MN and plan to check serum LDH, CMP in AM. Coags also ordered. Also consider pulmonology consult +/- HRCT.  - Oxygen by [place] prn - Therapeutic trial of duonebs given scattered wheezes. Long time smoker, no formal dx COPD.   Rheumatoid arthritis: Followed by Dr. Kellie Simmering, on MTX, folate, and prednisone prn flares.  - Start indomethacin and a little over maintenance IVF (SCr 0.82 likely borderline elevated for degree of muscle mass) - Continue folate. MTX is Mon, Tues.   HTN - BP 196/106 in ED. Patient did not take usual BP meds on day of admission.  - Home meds (Hydrodiuril) given in ED - Monitor  - Restart home medications metoprolol, HCTZ, and verapamil (?adjuntive tx for reynaud's)  Urinary retention - patient reports difficulty beginning urinary stream recently. Was unable to provide urine sample in the ED. Bladder scan showed 300 mL. Not sure why the difficulty voiding. No anticholinergics.  - In and out cath for UA. - Monitor  Use of black cohosh - patient says she takes supplement twice a day when she gets "too hot." Can lead to hepatic SE.  - AST, ALT, tbili WNL - INR in AM  FEN/GI: NPO w/sips with meds overnight for possible procedure. Heart healthy diet afterwards.  Prophylaxis: Lovenox  Disposition: Admit to inpatient in SDU on FMTS, McDiarmid.   History of Present Illness: Jocelyn Shelton is a 59 y.o. female presenting with chest and abdominal pain.   [name] reports that for the past few days she has  had pain everywhere, especially around her ribs on both side. She said the pain has been excruciating when coughing and "sticking pain" when taking a deep breath. Also with increasing SOB over the past week, especially on exertion. She reports an associated cough without sputum production. She also reports lower back muscle spasms and bilateral leg pain. She has  been taking Tylenol and ibuprofen (about 200mg  each day) over the past two days which did not alleviate her pain. She also reports chest pain in her midsternal region radiating to her umbilicus. She took Catering manager for this chest pain which did not relieve her symptoms either.   She denies any recent travel or hospitalizations, and does not know if she has ever had a blood clot. She doesn't know if she has a family history of clots. She reports recent unintentional weight loss which she attributes to lack of appetite. She does report difficulty initiating urinary stream recently, but denies dysuria.   She reports taking black cohosh twice a day if she gets "too hot." Did not take medications on day of presentation.   Poor historian with very nondescript symptoms and generalized complaints  Review Of Systems: Per HPI with the following additions: denies N/V/C/D, fevers, urinary incontinence; endorses fatigue Otherwise 12 point review of systems was performed and was unremarkable.  Patient Active Problem List   Diagnosis Date Noted  . Pleural effusion 02/08/2015  . Allergic rhinitis 12/03/2014  . Tendinitis of right rotator cuff 12/03/2014  . Chronic pain syndrome 12/03/2014  . Diabetes mellitus screening 03/26/2014  . Chronic foot pain 03/26/2014  . Plantar wart of right foot 03/26/2014  . Rheumatoid arthritis 03/14/2013  . Anemia 03/02/2013  . Other pancytopenia 03/01/2013  . Thrombocytopenia, unspecified 03/01/2013  . Facial rash 02/02/2012  . 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 Erythema nodosum 04/27/2011  . GASTROESOPHAGEAL REFLUX DISEASE 08/02/2008  . CORNS AND CALLOSITIES 03/18/2007  . Major depressive disorder, recurrent episode 09/23/2006  . Tobacco abuse 09/23/2006  . HYPERTENSION, BENIGN SYSTEMIC 09/23/2006  . RAYNAUDS SYNDROME 09/23/2006  . CLAUDICATION, INTERMITTENT 09/23/2006  . OSTEOARTHRITIS, MULTI  SITES 09/23/2006   Past Medical History: Past Medical History  Diagnosis Date  . Hypertension   . Reflux   . Scleroderma   . Rheumatoid arthritis(714.0)    Past Surgical History: Past Surgical History  Procedure Laterality Date  . Appendectomy  90's   Social History: History  Substance Use Topics  . Smoking status: Current Every Day Smoker -- 0.50 packs/day for 40 years    Types: Cigarettes  . Smokeless tobacco: Not on file  . Alcohol Use: 2.0 oz/week    4 drink(s) per week   Additional social history: none. Please also refer to relevant sections of EMR.  Family History: Family History  Problem Relation Age of Onset  . Hypertension Father   . Heart attack Mother   . Diabetes Daughter   . Diabetes Sister   . Lung cancer Brother 50   Allergies and Medications: Allergies  Allergen Reactions  . Aspirin Other (See Comments)    Stomach cramps  . Cymbalta [Duloxetine Hcl] Nausea Only    No appetite, stomach pain  . Tomato Rash   No current facility-administered medications on file prior to encounter.   Current Outpatient Prescriptions on File Prior to Encounter  Medication Sig Dispense Refill  . acetaminophen (TYLENOL) 500 MG tablet Take 1,000 mg by mouth daily as needed for pain.     . ATROVENT HFA 17 MCG/ACT inhaler INHALE TWO  PUFFS BY MOUTH ONCE DAILY AS NEEDED FOR WHEEZING 13 g 2  . BLACK COHOSH PO Take 1 capsule by mouth 2 (two) times daily.    Marland Kitchen CALCIUM PO Take 1 tablet by mouth daily.    . cetirizine (ZYRTEC) 10 MG tablet Take 1 tablet (10 mg total) by mouth daily. 30 tablet 6  . fluticasone (FLONASE) 50 MCG/ACT nasal spray Place 2 sprays into both nostrils daily as needed for rhinitis. 16 g 1  . folic acid (FOLVITE) 1 MG tablet TAKE 1 TABLET (1 MG TOTAL) BY MOUTH DAILY. 30 tablet 6  . hydrochlorothiazide (HYDRODIURIL) 25 MG tablet  Take 1 tablet (25 mg total) by mouth daily. 90 tablet 0  . meloxicam (MOBIC) 7.5 MG tablet Take 1 tablet (7.5 mg total) by mouth daily. Take for up to 2 weeks then stop and use as needed only. 30 tablet 1  . methotrexate (RHEUMATREX) 2.5 MG tablet Take 12.5 mg by mouth 2 (two) times a week. Take 5 tablets every Monday and Tuesday. Caution:Chemotherapy. Protect from light.    . metoprolol (LOPRESSOR) 50 MG tablet TAKE ONE TABLET BY MOUTH TWICE DAILY 180 tablet 2  . Multiple Vitamin (MULTIVITAMIN WITH MINERALS) TABS tablet Take 1 tablet by mouth daily.    Marland Kitchen OLANZapine (ZYPREXA) 5 MG tablet Take 1 tablet (5 mg total) by mouth at bedtime. 30 tablet 11  . omeprazole (PRILOSEC) 40 MG capsule TAKE ONE CAPSULE BY MOUTH ONCE DAILY 90 capsule 2  . verapamil (CALAN) 80 MG tablet TAKE ONE TABLET BY MOUTH THREE TIMES DAILY 90 tablet 3  . DULoxetine (CYMBALTA) 60 MG capsule Take 1 capsule (60 mg total) by mouth daily. (Patient not taking: Reported on 02/08/2015) 30 capsule 2  . predniSONE (DELTASONE) 5 MG tablet Take 1 tablet (5 mg total) by mouth daily. Do not start taking until finished with taper down to 5 mg. (Patient not taking: Reported on 12/03/2014) 30 tablet 0    Objective: BP 184/98 mmHg  Pulse 98  Temp(Src) 99.9 F (37.7 C) (Oral)  Resp 24  SpO2 100% Exam: General: frail cachectic-appearing elderly female lying in bed in NAD Eyes: PERRLA ENTM: dry/cracked lips Cardiovascular: tachycardic, no murmurs appreciated, no JVD, no pitting edema Respiratory: end expiratory wheezing, diminished breath sounds at both bases, very poor air movement on R side. Nonlabored 100% on 2L O2.  Abdomen: soft, generalized tenderness without rebound or guarding, non-distended, +BS MSK: negative Homan's sign Skin: no rashes or bruises noted Neuro: A&Ox3, no focal deficits, 5/5 strength LE bilaterally.  Psych: appropriate mood and affect low volume and rate of speech,    Labs and Imaging: CBC BMET   Last Labs      Recent Labs Lab 02/08/15 1746  WBC 6.9  HGB 12.3  HCT 37.2  PLT 248      Last Labs      Recent Labs Lab 02/08/15 1746  NA 135  K 3.6  CL 99*  CO2 27  BUN <5*  CREATININE 0.82  GLUCOSE 101*  CALCIUM 8.4*       Dg Chest 2 View  02/08/2015 CLINICAL DATA: Weakness, chest pain and abdominal pain. EXAM: CHEST 2 VIEW COMPARISON: 04/13/2011 FINDINGS: Heart size appears normal. Small bilateral pleural effusions are identified. There is mild interstitial edema and decreased aeration of both lung bases. The visualized osseous structures are unremarkable. IMPRESSION: 1. Mild congestive heart failure is suspected. Electronically Signed By: Signa Kell M.D. On: 02/08/2015 18:52   Ct Abdomen Pelvis W  Contrast  02/08/2015 CLINICAL DATA: Abdominal pain for 2 weeks EXAM: CT ABDOMEN AND PELVIS WITH CONTRAST TECHNIQUE: Multidetector CT imaging of the abdomen and pelvis was performed using the standard protocol following bolus administration of intravenous contrast. CONTRAST: OMNIPAQUE IOHEXOL 300 MG/ML SOLN COMPARISON: None. FINDINGS: There is small right pleural effusion. Small loculated left antral lateral pleural effusion. Mild atelectasis noted bilateral lower lobe. Sagittal images of the spine are unremarkable. Atherosclerotic calcifications of abdominal aorta and iliac arteries are noted. Liver, pancreas, spleen and adrenal glands are unremarkable. Kidneys are symmetrical in size and enhancement. No hydronephrosis or hydroureter. No aortic aneurysm. Delayed renal images shows bilateral renal symmetrical excretion. No small bowel obstruction. No ascites or free air. No adenopathy. The uterus is unremarkable. There is no adnexal mass. No pericecal inflammation. The patient is status post appendectomy. The terminal ileum is unremarkable. IMPRESSION: 1. There is small right pleural  effusion. Small loculated left pleural effusion. Bilateral lower lobe posterior atelectasis. 2. No acute inflammatory process within abdomen or pelvis. 3. No hydronephrosis or hydroureter. 4. No pericecal inflammation. Status post appendectomy. 5. Unremarkable uterus and adnexa. Electronically Signed By: Natasha Mead M.D. On: 02/08/2015 19:45     Keiden Deskin Shelbie Hutching, MD 02/08/2015, 10:32 PM PGY-1, [facility] FPTS Intern pager: [phone], text pages welcome   I have seen and evaluated the patient with Dr. Natale Milch. I am in agreement with the note above in its revised form. My additions are in red.  Ryan B. Jarvis Newcomer, MD, PGY-3 02/09/2015 12:50 AM       Cosigned by: Leighton Roach McDiarmid, MD at 02/09/2015 3:05 PM

## 2015-02-13 ENCOUNTER — Other Ambulatory Visit: Payer: Self-pay | Admitting: Internal Medicine

## 2015-02-15 LAB — CULTURE, BODY FLUID W GRAM STAIN -BOTTLE: Culture: NO GROWTH

## 2015-02-25 ENCOUNTER — Encounter: Payer: Self-pay | Admitting: Family Medicine

## 2015-02-25 ENCOUNTER — Ambulatory Visit (INDEPENDENT_AMBULATORY_CARE_PROVIDER_SITE_OTHER): Payer: Medicaid Other | Admitting: Family Medicine

## 2015-02-25 VITALS — BP 127/71 | HR 73 | Temp 98.1°F | Ht 66.0 in | Wt 149.0 lb

## 2015-02-25 DIAGNOSIS — G894 Chronic pain syndrome: Secondary | ICD-10-CM | POA: Diagnosis not present

## 2015-02-25 DIAGNOSIS — M069 Rheumatoid arthritis, unspecified: Secondary | ICD-10-CM | POA: Diagnosis present

## 2015-02-25 DIAGNOSIS — J90 Pleural effusion, not elsewhere classified: Secondary | ICD-10-CM

## 2015-02-25 DIAGNOSIS — F330 Major depressive disorder, recurrent, mild: Secondary | ICD-10-CM | POA: Diagnosis not present

## 2015-02-25 MED ORDER — METHOTREXATE 2.5 MG PO TABS: 12.5000 mg | ORAL_TABLET | ORAL | Status: DC

## 2015-02-25 MED ORDER — MELOXICAM 15 MG PO TABS: 15.0000 mg | ORAL_TABLET | Freq: Every day | ORAL | Status: DC

## 2015-02-25 MED ORDER — PREDNISONE 5 MG PO TABS: 5.0000 mg | ORAL_TABLET | Freq: Every day | ORAL | Status: DC

## 2015-02-25 NOTE — Assessment & Plan Note (Addendum)
Clinically improving s/p hospitalization with thoracentesis. Only residual mild chest wall pains. No other assoc symptoms. - S/p thoracentesis 7/27 with fluid analysis suggests exudative. Culture No Growth, no evidence malignancy. - Chest CTA 02/09/15  Plan: 1. Reassurance, gradual improvement, unclear overall etiology may be related to chronic autoimmune / RA. Known mediastinal and axillary LAD likely reactive, no evidence for malignancy at this time. 2. Recommend repeat Chest CT testing for surveillance within 1 year, otherwise no further follow-up testing at this time

## 2015-02-25 NOTE — Assessment & Plan Note (Signed)
Self discontinued Cymbalta. Continues to take Zyprexa. Previously referred to Psychiatry, pending follow-up appointment.

## 2015-02-25 NOTE — Progress Notes (Signed)
   Subjective:    Patient ID: Jocelyn Shelton, female    DOB: [birth year], 59 y.o.   MRN: [id number]  HPI  Hospital Follow-up from 02/08/15 to 02/11/15 for chest and abdominal pain, dx with bilateral pleural effusions (L > R, with loculated), no inflammatory process identified. Additional imaging with Chest CTA obtained confirmed no PE or acute findings, did identify shotty axillary and mediastinal adenopathy. S/p Thoracentesis on 7/17, significantly relieved her pain, fluid alsysis consistent with exudate but no organisms seen, ECHO confirm mild pulm HTN. - Today patient reports that her initial symptoms were bilateral lower rib pain, chest, and back pain, overall symptoms present for about 1 week prior to hospitalization. Admits to significant relief from thoracentesis. Today still describes some "tightness or pressure" but significantly improved. - Reviewed results on Chest CT with lymphadenopathy - Requesting refill of pain medicines (out of mobic and prednisone). - Denies any new or worsening symptoms, fevers/chills, nausea / vomiting, abd pain, other joint swelling or pain  RHEUMATOID ARTHRITIS / CHRONIC PAIN SYNDROME: - During recent hospitalization, she missed recent follow-up with Dr. Kellie Simmering (Rheum), plans to re-schedule. Currently out of Mobic and Prednisone. Also MTX requesting refills - Reports generalized persistent RA pain with multiple joints, knees, hip, LBP, shoulders, unchanged from previous. - Self discontinued Cymbalta due to not liking the way it made her feel, describes side-effects with poor appetite, "reduced energy and felt like zombie". This was rx for her depression and chronic pain. Only tried taking for few days then stopped. - Taking Tylenol without significant relief  I have reviewed and updated the following as appropriate: allergies and current medications  Social Hx: - Active smoker, 10 cigs daily  Review of Systems  See above HPI    Objective:   Physical  Exam  BP 127/71 mmHg  Pulse 73  Temp(Src) 98.1 F (36.7 C) (Oral)  Ht 5\' 6"  (1.676 m)  Wt 149 lb (67.586 kg)  BMI 24.06 kg/m2  Gen - well-appearing, comfortable, NAD HEENT - oropharynx clear, MMM Neck - supple, non-tender, no LAD, no thyromegaly Heart - RRR, no murmurs heard Lungs - CTAB, no wheezing, crackles, or rhonchi. Normal work of breathing. MSK - Back: no deformity, generalized tenderness to palpation bilateral lumbar paraspinal muscles. Seated SLR negative radiculopathy. Otherwise bilateral knees and shoulders without acute tenderness to palpation or effusion, erythema. Ext - non-tender, no edema, peripheral pulses intact +2 b/l Skin - warm, dry, no rashes Neuro - awake, alert, oriented, grossly non-focal, intact muscle strength 5/5 b/l grip and ankle dorsiflex, intact distal sensation to light touch, gait normal    Assessment & Plan:   See specific A&P problem list for details.

## 2015-02-25 NOTE — Assessment & Plan Note (Addendum)
Stable, chronic RA with generalized joint pain. Previously had R-shoulder flare with bursitis, seems improved. Missed last Rheum apt. Due for refills Mobic, Prednisone, MTX. - Recent increase pain with pleural effusion s/p thoracentesis (significantly improved)  Plan: 1. Increase Mobic from 7.5mg  daily to 15mg  daily (new rx sent for 1 month trial) with refill 2. Refilled Prednisone, chronic 5mg  daily until can f/u with Rheum 3. Refilled MTX 2.5mg  tabs (#40, 0 refill) for 1 month supply, 25mg /wk with 5 tabs 12.5mg  dose per Mon/Tues weekly 4. F/u with Dr. Rheumatology, need to re-schedule missed apt

## 2015-02-25 NOTE — Assessment & Plan Note (Addendum)
Consistent with stable generalized pain from RA. - See details under RA - Treat with continued prednisone, inc mobic to 15mg  qd, refill MTX

## 2015-02-25 NOTE — Patient Instructions (Addendum)
Dear Jocelyn Shelton, Thank you for coming in to clinic today. It was good to see you!  1. I'm glad you are doing better after hospital. Your symptoms should continue to improve. 2. Refilled Prednisone 5mg  daily, Methotrexate take 5 pills on Mon / Tues (may try on Thurs if preferred) 3. Increase dose Meloxicam from 7.5 to 15mg  daily for 1 month 4. Use heating pad, ice, rest avoid over activity  Schedule follow-up with Dr. Wed within next month  Please schedule a follow-up appointment with Dr. in 1 month for follow-up, otherwise 3 to 6 months for routine follow-up  If you have any other questions or concerns, please feel free to call the clinic to contact me. You may also schedule an earlier appointment if necessary.  However, if your symptoms get significantly worse, please go to the Emergency Department to seek immediate medical attention.  Kellie Simmering, DO [facility]

## 2015-02-26 NOTE — Progress Notes (Signed)
I was preceptor the day of this visit.   

## 2015-03-08 LAB — FUNGUS CULTURE W SMEAR: Fungal Smear: NONE SEEN

## 2015-03-25 ENCOUNTER — Other Ambulatory Visit: Payer: Self-pay | Admitting: Family Medicine

## 2015-03-25 DIAGNOSIS — I4891 Unspecified atrial fibrillation: Secondary | ICD-10-CM

## 2015-03-25 LAB — AFB CULTURE WITH SMEAR (NOT AT ARMC): ACID FAST SMEAR: NONE SEEN

## 2015-04-22 ENCOUNTER — Other Ambulatory Visit: Payer: Self-pay | Admitting: Family Medicine

## 2015-04-22 DIAGNOSIS — M069 Rheumatoid arthritis, unspecified: Secondary | ICD-10-CM

## 2015-04-27 DIAGNOSIS — I219 Acute myocardial infarction, unspecified: Secondary | ICD-10-CM

## 2015-04-27 HISTORY — DX: Acute myocardial infarction, unspecified: I21.9

## 2015-05-21 ENCOUNTER — Encounter (HOSPITAL_COMMUNITY): Payer: Self-pay | Admitting: Emergency Medicine

## 2015-05-21 ENCOUNTER — Inpatient Hospital Stay (HOSPITAL_COMMUNITY)
Admission: EM | Admit: 2015-05-21 | Discharge: 2015-05-24 | DRG: 246 | Disposition: A | Discharge status: Home / Self Care | Payer: Medicaid Other | Attending: Internal Medicine | Admitting: Internal Medicine

## 2015-05-21 DIAGNOSIS — E871 Hypo-osmolality and hyponatremia: Secondary | ICD-10-CM | POA: Diagnosis present

## 2015-05-21 DIAGNOSIS — R079 Chest pain, unspecified: Secondary | ICD-10-CM | POA: Diagnosis present

## 2015-05-21 DIAGNOSIS — R0602 Shortness of breath: Secondary | ICD-10-CM | POA: Diagnosis not present

## 2015-05-21 DIAGNOSIS — J189 Pneumonia, unspecified organism: Secondary | ICD-10-CM | POA: Diagnosis present

## 2015-05-21 DIAGNOSIS — M1990 Unspecified osteoarthritis, unspecified site: Secondary | ICD-10-CM | POA: Diagnosis present

## 2015-05-21 DIAGNOSIS — T45525A Adverse effect of antithrombotic drugs, initial encounter: Secondary | ICD-10-CM | POA: Diagnosis not present

## 2015-05-21 DIAGNOSIS — T50905A Adverse effect of unspecified drugs, medicaments and biological substances, initial encounter: Secondary | ICD-10-CM | POA: Diagnosis not present

## 2015-05-21 DIAGNOSIS — Z95820 Peripheral vascular angioplasty status with implants and grafts: Secondary | ICD-10-CM

## 2015-05-21 DIAGNOSIS — F329 Major depressive disorder, single episode, unspecified: Secondary | ICD-10-CM | POA: Diagnosis present

## 2015-05-21 DIAGNOSIS — J90 Pleural effusion, not elsewhere classified: Secondary | ICD-10-CM | POA: Diagnosis present

## 2015-05-21 DIAGNOSIS — Z79899 Other long term (current) drug therapy: Secondary | ICD-10-CM

## 2015-05-21 DIAGNOSIS — E785 Hyperlipidemia, unspecified: Secondary | ICD-10-CM | POA: Diagnosis present

## 2015-05-21 DIAGNOSIS — I10 Essential (primary) hypertension: Secondary | ICD-10-CM | POA: Diagnosis present

## 2015-05-21 DIAGNOSIS — Z955 Presence of coronary angioplasty implant and graft: Secondary | ICD-10-CM

## 2015-05-21 DIAGNOSIS — I161 Hypertensive emergency: Secondary | ICD-10-CM | POA: Diagnosis present

## 2015-05-21 DIAGNOSIS — I2510 Atherosclerotic heart disease of native coronary artery without angina pectoris: Secondary | ICD-10-CM | POA: Diagnosis present

## 2015-05-21 DIAGNOSIS — M069 Rheumatoid arthritis, unspecified: Secondary | ICD-10-CM | POA: Diagnosis present

## 2015-05-21 DIAGNOSIS — K219 Gastro-esophageal reflux disease without esophagitis: Secondary | ICD-10-CM | POA: Diagnosis present

## 2015-05-21 DIAGNOSIS — F17210 Nicotine dependence, cigarettes, uncomplicated: Secondary | ICD-10-CM | POA: Diagnosis present

## 2015-05-21 DIAGNOSIS — I214 Non-ST elevation (NSTEMI) myocardial infarction: Principal | ICD-10-CM | POA: Diagnosis present

## 2015-05-21 DIAGNOSIS — Z7952 Long term (current) use of systemic steroids: Secondary | ICD-10-CM

## 2015-05-21 DIAGNOSIS — M349 Systemic sclerosis, unspecified: Secondary | ICD-10-CM | POA: Diagnosis present

## 2015-05-21 DIAGNOSIS — E876 Hypokalemia: Secondary | ICD-10-CM | POA: Diagnosis not present

## 2015-05-21 DIAGNOSIS — Z791 Long term (current) use of non-steroidal anti-inflammatories (NSAID): Secondary | ICD-10-CM

## 2015-05-21 HISTORY — DX: Atherosclerotic heart disease of native coronary artery without angina pectoris: I25.10

## 2015-05-21 MED ORDER — PANTOPRAZOLE SODIUM 40 MG PO TBEC
40.0000 mg | DELAYED_RELEASE_TABLET | Freq: Once | ORAL | Status: AC
  Administered 2015-05-22: 40 mg via ORAL
  Filled 2015-05-21: qty 1

## 2015-05-21 MED ORDER — GI COCKTAIL ~~LOC~~
30.0000 mL | Freq: Once | ORAL | Status: AC
Start: 2015-05-21 — End: 2015-05-22
  Administered 2015-05-22: 30 mL via ORAL
  Filled 2015-05-21: qty 30

## 2015-05-21 NOTE — ED Provider Notes (Signed)
CSN: [id number]     Arrival date & time 05/21/15  2305 History  By signing my name below, I, Lyndel Safe, attest that this documentation has been prepared under the direction and in the presence of Loren Racer, MD. Electronically Signed: Lyndel Safe, ED Scribe. 05/21/2015. 12:08 AM.   Chief Complaint  Patient presents with  . Sore Throat  . Cough   The history is provided by the patient. No language interpreter was used.   HPI Comments: Jocelyn Shelton is a 59 y.o. female, with a PMhx of HTN, RA, scleroderma and GERD, who presents to the Emergency Department complaining of a constant, burning epigastric pain and burning pain in pharynx that has been present for 1 week. She reports her symptoms are typical of her GERD. Her GERD symptoms are exacerbated when lying down and after eating or drinking. She associates a non-productive cough and orthopnea. Pt is a current smoker. The pt endorses ibuprofen use regularly.She additionally notes chronic left rib cage pain that has been present for several months. Denies fevers or chills.   Past Medical History  Diagnosis Date  . Hypertension   . Reflux   . Scleroderma (HCC)   . Rheumatoid arthritis(714.0)    Past Surgical History  Procedure Laterality Date  . Appendectomy  90's   Family History  Problem Relation Age of Onset  . Hypertension Father   . Heart attack Mother   . Diabetes Daughter   . Diabetes Sister   . Lung cancer Brother 56   Social History  Substance Use Topics  . Smoking status: Current Every Day Smoker -- 0.50 packs/day for 40 years    Types: Cigarettes  . Smokeless tobacco: None  . Alcohol Use: 2.0 oz/week    4 drink(s) per week   OB History    No data available     Review of Systems  Constitutional: Negative for fever and chills.  HENT: Positive for sore throat.   Respiratory: Positive for cough and shortness of breath. Negative for wheezing.   Cardiovascular: Negative for chest pain, palpitations  and leg swelling.  Gastrointestinal: Negative for nausea, vomiting, abdominal pain, diarrhea and blood in stool.  Genitourinary: Negative for dysuria and flank pain.  Musculoskeletal: Negative for back pain, neck pain and neck stiffness.  Skin: Negative for rash and wound.  Neurological: Negative for dizziness, weakness, light-headedness, numbness and headaches.  All other systems reviewed and are negative.  Allergies  Aspirin; Cymbalta; and Tomato  Home Medications   Prior to Admission medications   Medication Sig Start Date End Date Taking? Authorizing Provider  acetaminophen (TYLENOL) 500 MG tablet Take 1,000 mg by mouth daily as needed for pain.     Historical Provider, MD  ATROVENT HFA 17 MCG/ACT inhaler INHALE TWO PUFFS BY MOUTH ONCE DAILY AS NEEDED FOR WHEEZING 08/27/14   Smitty Cords, DO  bismuth subsalicylate (PEPTO BISMOL) 262 MG/15ML suspension Take 30 mLs by mouth every 6 (six) hours as needed for indigestion.    Historical Provider, MD  CALCIUM PO Take 1 tablet by mouth daily.    Historical Provider, MD  cetirizine (ZYRTEC) 10 MG tablet Take 1 tablet (10 mg total) by mouth daily. 11/23/11   Everrett Coombe, DO  DULoxetine (CYMBALTA) 60 MG capsule Take 1 capsule (60 mg total) by mouth daily. Patient not taking: Reported on 02/25/2015 12/03/14   Smitty Cords, DO  fluticasone [facility]) 50 MCG/ACT nasal spray Place 2 sprays into both nostrils daily as needed for rhinitis.  12/03/14   Smitty Cords, DO  folic acid (FOLVITE) 1 MG tablet TAKE 1 TABLET (1 MG TOTAL) BY MOUTH DAILY. 01/22/15   Smitty Cords, DO  hydrochlorothiazide (HYDRODIURIL) 25 MG tablet Take 1 tablet (25 mg total) by mouth daily. 10/25/14   Narda Bonds, MD  ibuprofen (ADVIL,MOTRIN) 200 MG tablet Take 200 mg by mouth every 6 (six) hours as needed for mild pain or moderate pain.    Historical Provider, MD  meloxicam (MOBIC) 15 MG tablet TAKE ONE TABLET BY MOUTH ONCE DAILY 04/22/15    Smitty Cords, DO  methotrexate (RHEUMATREX) 2.5 MG tablet Take 5 tablets (12.5 mg total) by mouth 2 (two) times a week. Take 5 tablets every Mon and Tues. Caution:Chemotherapy. Protect from light. 02/25/15   Smitty Cords, DO  metoprolol (LOPRESSOR) 50 MG tablet TAKE ONE TABLET BY MOUTH TWICE DAILY 01/21/15   Smitty Cords, DO  Multiple Vitamin (MULTIVITAMIN WITH MINERALS) TABS tablet Take 1 tablet by mouth daily.    Historical Provider, MD  OLANZapine (ZYPREXA) 5 MG tablet Take 1 tablet (5 mg total) by mouth at bedtime. 01/23/15   Smitty Cords, DO  omeprazole (PRILOSEC) 40 MG capsule TAKE ONE CAPSULE BY MOUTH ONCE DAILY 01/21/15   Smitty Cords, DO  predniSONE (DELTASONE) 5 MG tablet Take 1 tablet (5 mg total) by mouth daily. 02/25/15   Netta Neat Karamalegos, DO  verapamil (CALAN) 80 MG tablet TAKE ONE TABLET BY MOUTH THREE TIMES DAILY 03/26/15   Abram Sander, MD   BP 188/109 mmHg  Pulse 100  Temp(Src) 97.8 F (36.6 C) (Oral)  Resp 16  SpO2 100% Physical Exam  Constitutional: She is oriented to person, place, and time. She appears well-developed and well-nourished. No distress.  HENT:  Head: Normocephalic and atraumatic.  Mouth/Throat: Oropharynx is clear and moist. No oropharyngeal exudate.  Eyes: EOM are normal. Pupils are equal, round, and reactive to light.  Neck: Normal range of motion. Neck supple.  Cardiovascular: Normal rate and regular rhythm.  Exam reveals no gallop and no friction rub.   No murmur heard. Pulmonary/Chest: Effort normal and breath sounds normal. No respiratory distress. She has no wheezes. She has no rales. She exhibits no tenderness.  Abdominal: Soft. Bowel sounds are normal. She exhibits no distension and no mass. There is no tenderness. There is no rebound and no guarding.  Musculoskeletal: Normal range of motion. She exhibits no edema or tenderness.  Chest palpation of the left lateral chest wall. No crepitus or  deformity. No lower extremity swelling or pain.  Neurological: She is alert and oriented to person, place, and time.  5/5 motor in all extremities. Sensation is fully intact.  Skin: Skin is warm and dry. No rash noted. No erythema.  Psychiatric: She has a normal mood and affect. Her behavior is normal.  Nursing note and vitals reviewed.   ED Course  Procedures  DIAGNOSTIC STUDIES: Oxygen Saturation is 100% on RA, normal by my interpretation.    COORDINATION OF CARE: 11:39 PM Discussed treatment plan with pt at bedside and pt agreed to plan.   Labs Review Labs Reviewed  CBC WITH DIFFERENTIAL/PLATELET - Abnormal; Notable for the following:    RDW 16.6 (*)    Platelets 143 (*)    Neutro Abs 8.1 (*)    Lymphs Abs 0.3 (*)    All other components within normal limits  COMPREHENSIVE METABOLIC PANEL - Abnormal; Notable for the following:    Sodium  132 (*)    Chloride 95 (*)    Glucose, Bld 138 (*)    Calcium 8.6 (*)    Albumin 3.3 (*)    ALT 12 (*)    GFR calc non Af Amer 60 (*)    All other components within normal limits  BRAIN NATRIURETIC PEPTIDE - Abnormal; Notable for the following:    B Natriuretic Peptide 175.4 (*)    All other components within normal limits  TROPONIN I - Abnormal; Notable for the following:    Troponin I 0.18 (*)    All other components within normal limits  URINALYSIS, ROUTINE W REFLEX MICROSCOPIC (NOT AT [facility]) - Abnormal; Notable for the following:    APPearance CLOUDY (*)    Leukocytes, UA SMALL (*)    All other components within normal limits  URINE MICROSCOPIC-ADD ON - Abnormal; Notable for the following:    Squamous Epithelial / LPF FEW (*)    Bacteria, UA FEW (*)    All other components within normal limits  CBC - Abnormal; Notable for the following:    RDW 16.6 (*)    All other components within normal limits  I-STAT TROPOININ, ED - Abnormal; Notable for the following:    Troponin i, poc 1.15 (*)    All other components within normal  limits  LIPASE, BLOOD  HEPARIN LEVEL (UNFRACTIONATED)  TROPONIN I  TROPONIN I  TROPONIN I    Imaging Review Dg Chest 2 View  05/22/2015  CLINICAL DATA:  59 year old female with history of cough for 1 week. EXAM: CHEST  2 VIEW COMPARISON:  Chest x-ray 02/10/2015. FINDINGS: Opacity throughout the lingula, concerning for pneumonia. Small left pleural effusion. Right lung is clear. No evidence of pulmonary edema. Heart size is normal. Upper mediastinal contours are within normal limits. Atherosclerosis in the thoracic aorta. IMPRESSION: 1. Lingular airspace consolidation concerning for pneumonia. Small left parapneumonic pleural effusion. Followup PA and lateral chest X-ray is recommended in 3-4 weeks following trial of antibiotic therapy to ensure resolution and exclude underlying malignancy. 2. Atherosclerosis. Electronically Signed   By: Trudie Reed M.D.   On: 05/22/2015 00:13   I have personally reviewed and evaluated these images and lab results as part of my medical decision-making.   EKG Interpretation   Date/Time:  Wednesday May 22 2015 00:28:50 EDT Ventricular Rate:  95 PR Interval:  116 QRS Duration: 78 QT Interval:  389 QTC Calculation: 489 R Axis:   75 Text Interpretation:  Sinus rhythm Borderline short PR interval Probable  left atrial enlargement RSR' in V1 or V2, probably normal variant  Borderline prolonged QT interval Confirmed by Ranae Palms  MD, Bartlomiej Jenkinson ([id number])  on 05/22/2015 3:26:44 AM     CRITICAL CARE Performed by: Ranae Palms, Aashna Matson Total critical care time: 40 min Critical care time was exclusive of separately billable procedures and treating other patients. Critical care was necessary to treat or prevent imminent or life-threatening deterioration. Critical care was time spent personally by me on the following activities: development of treatment plan with patient and/or surrogate as well as nursing, discussions with consultants, evaluation of patient's  response to treatment, examination of patient, obtaining history from patient or surrogate, ordering and performing treatments and interventions, ordering and review of laboratory studies, ordering and review of radiographic studies, pulse oximetry and re-evaluation of patient's condition. MDM   Final diagnoses:  NSTEMI (non-ST elevated myocardial infarction) (HCC)  CAP (community acquired pneumonia)    I personally performed the services described in this documentation, which  was scribed in my presence. The recorded information has been reviewed and is accurate.    Patient states the burning discomfort in patient's chest is now improved after GI cocktail. Patient with infiltrate on the left on x-ray concerning for pneumonia. No recent hospitalizations. Coverage for community acquired pneumonia initiated in the emergency department. Patient's initial troponin mildly elevated at 0.18. Repeat troponin is now 1.15. Patient states she is allergic to aspirin. Will consult cardiology and repeat EKG.   Discussed with cardiology. Asked to have family medicine admit. Will see patient in emergency department. Discuss with family medicine resident. He will see patient in emergency department and admit.  Patient started having recurrent substernal burning pain. IV nitroglycerin initiated. Patient states the pain is improved. Patient was also given bolus of heparin and will start on heparin drip per pharmacy.  Repeat EKG with new ST segment depression in lateral leads.  Loren Racer, MD 05/22/15 0600

## 2015-05-21 NOTE — ED Notes (Signed)
Pt reports pain in her throat that usually occurs after she has eaten, sometimes feeling like "something is stuck in my throat."  She reports a non-productive cough for the past two weeks.

## 2015-05-21 NOTE — ED Notes (Signed)
Patient transported to X-ray 

## 2015-05-22 ENCOUNTER — Encounter (HOSPITAL_COMMUNITY)
Admission: EM | Disposition: A | Discharge status: Home / Self Care | Payer: Self-pay | Source: Home / Self Care | Attending: Family Medicine

## 2015-05-22 ENCOUNTER — Emergency Department (HOSPITAL_COMMUNITY): Payer: Medicaid Other

## 2015-05-22 DIAGNOSIS — I2510 Atherosclerotic heart disease of native coronary artery without angina pectoris: Secondary | ICD-10-CM | POA: Diagnosis not present

## 2015-05-22 DIAGNOSIS — I10 Essential (primary) hypertension: Secondary | ICD-10-CM | POA: Diagnosis not present

## 2015-05-22 DIAGNOSIS — M069 Rheumatoid arthritis, unspecified: Secondary | ICD-10-CM | POA: Diagnosis present

## 2015-05-22 DIAGNOSIS — M349 Systemic sclerosis, unspecified: Secondary | ICD-10-CM | POA: Diagnosis not present

## 2015-05-22 DIAGNOSIS — E876 Hypokalemia: Secondary | ICD-10-CM | POA: Diagnosis not present

## 2015-05-22 DIAGNOSIS — R079 Chest pain, unspecified: Secondary | ICD-10-CM

## 2015-05-22 DIAGNOSIS — E785 Hyperlipidemia, unspecified: Secondary | ICD-10-CM | POA: Diagnosis present

## 2015-05-22 DIAGNOSIS — Z791 Long term (current) use of non-steroidal anti-inflammatories (NSAID): Secondary | ICD-10-CM | POA: Diagnosis not present

## 2015-05-22 DIAGNOSIS — I161 Hypertensive emergency: Secondary | ICD-10-CM | POA: Diagnosis not present

## 2015-05-22 DIAGNOSIS — J189 Pneumonia, unspecified organism: Secondary | ICD-10-CM | POA: Diagnosis not present

## 2015-05-22 DIAGNOSIS — R0602 Shortness of breath: Secondary | ICD-10-CM | POA: Diagnosis not present

## 2015-05-22 DIAGNOSIS — F329 Major depressive disorder, single episode, unspecified: Secondary | ICD-10-CM | POA: Diagnosis present

## 2015-05-22 DIAGNOSIS — K219 Gastro-esophageal reflux disease without esophagitis: Secondary | ICD-10-CM

## 2015-05-22 DIAGNOSIS — M1990 Unspecified osteoarthritis, unspecified site: Secondary | ICD-10-CM | POA: Diagnosis present

## 2015-05-22 DIAGNOSIS — F17210 Nicotine dependence, cigarettes, uncomplicated: Secondary | ICD-10-CM | POA: Diagnosis present

## 2015-05-22 DIAGNOSIS — I214 Non-ST elevation (NSTEMI) myocardial infarction: Principal | ICD-10-CM

## 2015-05-22 DIAGNOSIS — Z79899 Other long term (current) drug therapy: Secondary | ICD-10-CM | POA: Diagnosis not present

## 2015-05-22 DIAGNOSIS — T45525A Adverse effect of antithrombotic drugs, initial encounter: Secondary | ICD-10-CM | POA: Diagnosis not present

## 2015-05-22 DIAGNOSIS — J90 Pleural effusion, not elsewhere classified: Secondary | ICD-10-CM | POA: Diagnosis not present

## 2015-05-22 DIAGNOSIS — Z7952 Long term (current) use of systemic steroids: Secondary | ICD-10-CM | POA: Diagnosis not present

## 2015-05-22 DIAGNOSIS — E871 Hypo-osmolality and hyponatremia: Secondary | ICD-10-CM | POA: Diagnosis present

## 2015-05-22 HISTORY — PX: CARDIAC CATHETERIZATION: SHX172

## 2015-05-22 LAB — URINALYSIS, ROUTINE W REFLEX MICROSCOPIC
BILIRUBIN URINE: NEGATIVE
GLUCOSE, UA: NEGATIVE mg/dL
HGB URINE DIPSTICK: NEGATIVE
KETONES UR: NEGATIVE mg/dL
NITRITE: NEGATIVE
PH: 6 (ref 5.0–8.0)
Protein, ur: NEGATIVE mg/dL
Specific Gravity, Urine: 1.015 (ref 1.005–1.030)
Urobilinogen, UA: 1 mg/dL (ref 0.0–1.0)

## 2015-05-22 LAB — COMPREHENSIVE METABOLIC PANEL
ALK PHOS: 84 U/L (ref 38–126)
ALT: 12 U/L — ABNORMAL LOW (ref 14–54)
ANION GAP: 8 (ref 5–15)
AST: 26 U/L (ref 15–41)
Albumin: 3.3 g/dL — ABNORMAL LOW (ref 3.5–5.0)
BUN: 7 mg/dL (ref 6–20)
CALCIUM: 8.6 mg/dL — AB (ref 8.9–10.3)
CO2: 29 mmol/L (ref 22–32)
Chloride: 95 mmol/L — ABNORMAL LOW (ref 101–111)
Creatinine, Ser: 1 mg/dL (ref 0.44–1.00)
GFR, EST NON AFRICAN AMERICAN: 60 mL/min — AB (ref >60)
Glucose, Bld: 138 mg/dL — ABNORMAL HIGH (ref 65–99)
Potassium: 3.5 mmol/L (ref 3.5–5.1)
SODIUM: 132 mmol/L — AB (ref 135–145)
Total Bilirubin: 0.4 mg/dL (ref 0.3–1.2)
Total Protein: 7.9 g/dL (ref 6.5–8.1)

## 2015-05-22 LAB — CBC
HEMATOCRIT: 38.6 % (ref 36.0–46.0)
HEMATOCRIT: 36.4 % (ref 36.0–46.0)
HEMATOCRIT: 36.9 % (ref 36.0–46.0)
HEMOGLOBIN: 12.7 g/dL (ref 12.0–15.0)
Hemoglobin: 11.8 g/dL — ABNORMAL LOW (ref 12.0–15.0)
Hemoglobin: 12.2 g/dL (ref 12.0–15.0)
MCH: 31.9 pg (ref 26.0–34.0)
MCH: 31.6 pg (ref 26.0–34.0)
MCH: 32 pg (ref 26.0–34.0)
MCHC: 32.9 g/dL (ref 30.0–36.0)
MCHC: 32.4 g/dL (ref 30.0–36.0)
MCHC: 33.1 g/dL (ref 30.0–36.0)
MCV: 97 fL (ref 78.0–100.0)
MCV: 97.6 fL (ref 78.0–100.0)
MCV: 96.9 fL (ref 78.0–100.0)
PLATELETS: 202 10*3/uL (ref 150–400)
Platelets: 152 10*3/uL (ref 150–400)
Platelets: 134 10*3/uL — ABNORMAL LOW (ref 150–400)
RBC: 3.98 MIL/uL (ref 3.87–5.11)
RBC: 3.73 MIL/uL — ABNORMAL LOW (ref 3.87–5.11)
RBC: 3.81 MIL/uL — AB (ref 3.87–5.11)
RDW: 16.6 % — ABNORMAL HIGH (ref 11.5–15.5)
RDW: 16.6 % — AB (ref 11.5–15.5)
RDW: 16.7 % — ABNORMAL HIGH (ref 11.5–15.5)
WBC: 8.4 10*3/uL (ref 4.0–10.5)
WBC: 6.2 10*3/uL (ref 4.0–10.5)
WBC: 6.9 10*3/uL (ref 4.0–10.5)

## 2015-05-22 LAB — CBC WITH DIFFERENTIAL/PLATELET
Basophils Absolute: 0 10*3/uL (ref 0.0–0.1)
Basophils Relative: 0 %
EOS ABS: 0 10*3/uL (ref 0.0–0.7)
EOS PCT: 0 %
HCT: 38.8 % (ref 36.0–46.0)
HEMOGLOBIN: 12.4 g/dL (ref 12.0–15.0)
LYMPHS ABS: 0.3 10*3/uL — AB (ref 0.7–4.0)
Lymphocytes Relative: 3 %
MCH: 31.2 pg (ref 26.0–34.0)
MCHC: 32 g/dL (ref 30.0–36.0)
MCV: 97.5 fL (ref 78.0–100.0)
MONOS PCT: 2 %
Monocytes Absolute: 0.2 10*3/uL (ref 0.1–1.0)
Neutro Abs: 8.1 10*3/uL — ABNORMAL HIGH (ref 1.7–7.7)
Neutrophils Relative %: 95 %
PLATELETS: 143 10*3/uL — AB (ref 150–400)
RBC: 3.98 MIL/uL (ref 3.87–5.11)
RDW: 16.6 % — ABNORMAL HIGH (ref 11.5–15.5)
WBC: 8.6 10*3/uL (ref 4.0–10.5)

## 2015-05-22 LAB — I-STAT TROPONIN, ED: Troponin i, poc: 1.15 ng/mL (ref 0.00–0.08)

## 2015-05-22 LAB — LIPID PANEL
Cholesterol: 140 mg/dL (ref 0–200)
HDL: 51 mg/dL (ref >40)
LDL CALC: 76 mg/dL (ref 0–99)
Total CHOL/HDL Ratio: 2.7 RATIO
Triglycerides: 65 mg/dL (ref <150)
VLDL: 13 mg/dL (ref 0–40)

## 2015-05-22 LAB — TROPONIN I
TROPONIN I: 0.18 ng/mL — AB (ref <0.031)
TROPONIN I: 15.06 ng/mL — AB (ref <0.031)
TROPONIN I: 18.79 ng/mL — AB (ref <0.031)
TROPONIN I: 5.93 ng/mL — AB (ref <0.031)
Troponin I: 10.86 ng/mL (ref <0.031)

## 2015-05-22 LAB — PROTIME-INR
INR: 1.24 (ref 0.00–1.49)
PROTHROMBIN TIME: 15.8 s — AB (ref 11.6–15.2)

## 2015-05-22 LAB — BASIC METABOLIC PANEL
Anion gap: 13 (ref 5–15)
BUN: 6 mg/dL (ref 6–20)
CALCIUM: 8.6 mg/dL — AB (ref 8.9–10.3)
CO2: 24 mmol/L (ref 22–32)
CREATININE: 0.72 mg/dL (ref 0.44–1.00)
Chloride: 104 mmol/L (ref 101–111)
GFR calc Af Amer: >60 mL/min (ref >60)
GFR calc non Af Amer: >60 mL/min (ref >60)
GLUCOSE: 88 mg/dL (ref 65–99)
Potassium: 3.9 mmol/L (ref 3.5–5.1)
Sodium: 141 mmol/L (ref 135–145)

## 2015-05-22 LAB — URINE MICROSCOPIC-ADD ON

## 2015-05-22 LAB — LIPASE, BLOOD: LIPASE: 28 U/L (ref 11–51)

## 2015-05-22 LAB — CREATININE, SERUM
Creatinine, Ser: 0.74 mg/dL (ref 0.44–1.00)
GFR calc Af Amer: >60 mL/min (ref >60)
GFR calc non Af Amer: >60 mL/min (ref >60)

## 2015-05-22 LAB — MRSA PCR SCREENING: MRSA BY PCR: NEGATIVE

## 2015-05-22 LAB — BRAIN NATRIURETIC PEPTIDE: B Natriuretic Peptide: 175.4 pg/mL — ABNORMAL HIGH (ref 0.0–100.0)

## 2015-05-22 LAB — MAGNESIUM: Magnesium: 2 mg/dL (ref 1.7–2.4)

## 2015-05-22 LAB — HEPARIN LEVEL (UNFRACTIONATED): Heparin Unfractionated: 0.38 IU/mL (ref 0.30–0.70)

## 2015-05-22 SURGERY — LEFT HEART CATH AND CORONARY ANGIOGRAPHY
Anesthesia: LOCAL

## 2015-05-22 MED ORDER — INFLUENZA VAC SPLIT QUAD 0.5 ML IM SUSY
0.5000 mL | PREFILLED_SYRINGE | INTRAMUSCULAR | Status: AC
  Administered 2015-05-23: 14:00:00 0.5 mL via INTRAMUSCULAR
  Filled 2015-05-22: qty 0.5

## 2015-05-22 MED ORDER — ASPIRIN 81 MG PO CHEW: 81.0000 mg | CHEWABLE_TABLET | ORAL | Status: DC

## 2015-05-22 MED ORDER — HEPARIN (PORCINE) IN NACL 2-0.9 UNIT/ML-% IJ SOLN
INTRAMUSCULAR | Status: AC
  Filled 2015-05-22: qty 500

## 2015-05-22 MED ORDER — SODIUM CHLORIDE 0.9 % IJ SOLN
3.0000 mL | Freq: Two times a day (BID) | INTRAMUSCULAR | Status: DC
  Administered 2015-05-22: 3 mL via INTRAVENOUS

## 2015-05-22 MED ORDER — NITROGLYCERIN 0.4 MG SL SUBL: 0.4000 mg | SUBLINGUAL_TABLET | SUBLINGUAL | Status: DC | PRN

## 2015-05-22 MED ORDER — HYDRALAZINE HCL 20 MG/ML IJ SOLN
10.0000 mg | Freq: Four times a day (QID) | INTRAMUSCULAR | Status: DC | PRN
  Administered 2015-05-23 (×2): 10 mg via INTRAVENOUS
  Filled 2015-05-22 (×3): qty 1

## 2015-05-22 MED ORDER — METOPROLOL TARTRATE 1 MG/ML IV SOLN
5.0000 mg | Freq: Four times a day (QID) | INTRAVENOUS | Status: DC
  Administered 2015-05-22: 5 mg via INTRAVENOUS
  Filled 2015-05-22 (×2): qty 5

## 2015-05-22 MED ORDER — MORPHINE SULFATE (PF) 2 MG/ML IV SOLN
2.0000 mg | Freq: Once | INTRAVENOUS | Status: AC
  Administered 2015-05-22: 2 mg via INTRAVENOUS
  Filled 2015-05-22: qty 1

## 2015-05-22 MED ORDER — FENTANYL CITRATE (PF) 100 MCG/2ML IJ SOLN
INTRAMUSCULAR | Status: DC | PRN
  Administered 2015-05-22 (×2): 25 ug via INTRAVENOUS

## 2015-05-22 MED ORDER — NITROGLYCERIN 1 MG/10 ML FOR IR/CATH LAB
INTRA_ARTERIAL | Status: DC | PRN
  Administered 2015-05-22 (×4): 200 ug via INTRACORONARY

## 2015-05-22 MED ORDER — METOPROLOL TARTRATE 25 MG PO TABS
25.0000 mg | ORAL_TABLET | Freq: Two times a day (BID) | ORAL | Status: DC
  Administered 2015-05-22 (×2): 25 mg via ORAL
  Filled 2015-05-22 (×4): qty 1

## 2015-05-22 MED ORDER — ATORVASTATIN CALCIUM 80 MG PO TABS: 80.0000 mg | ORAL_TABLET | Freq: Every day | ORAL | Status: DC

## 2015-05-22 MED ORDER — SODIUM CHLORIDE 0.9 % IV SOLN: 250.0000 mL | INTRAVENOUS | Status: DC | PRN

## 2015-05-22 MED ORDER — IPRATROPIUM-ALBUTEROL 0.5-2.5 (3) MG/3ML IN SOLN: 3.0000 mL | Freq: Four times a day (QID) | RESPIRATORY_TRACT | Status: DC | PRN

## 2015-05-22 MED ORDER — MIDAZOLAM HCL 2 MG/2ML IJ SOLN
INTRAMUSCULAR | Status: AC
  Filled 2015-05-22: qty 4

## 2015-05-22 MED ORDER — PREDNISONE 20 MG PO TABS
30.0000 mg | ORAL_TABLET | Freq: Every day | ORAL | Status: DC
  Administered 2015-05-23 – 2015-05-24 (×2): 30 mg via ORAL
  Filled 2015-05-22: qty 1
  Filled 2015-05-22: qty 2
  Filled 2015-05-22 (×3): qty 1

## 2015-05-22 MED ORDER — ACETAMINOPHEN 325 MG PO TABS: 650.0000 mg | ORAL_TABLET | ORAL | Status: DC | PRN

## 2015-05-22 MED ORDER — FENTANYL CITRATE (PF) 100 MCG/2ML IJ SOLN
INTRAMUSCULAR | Status: AC
  Filled 2015-05-22: qty 4

## 2015-05-22 MED ORDER — TICAGRELOR 90 MG PO TABS
ORAL_TABLET | ORAL | Status: AC
  Filled 2015-05-22: qty 1

## 2015-05-22 MED ORDER — BIVALIRUDIN BOLUS VIA INFUSION - CUPID
INTRAVENOUS | Status: DC | PRN
  Administered 2015-05-22: 49.35 mg via INTRAVENOUS

## 2015-05-22 MED ORDER — HEPARIN (PORCINE) IN NACL 100-0.45 UNIT/ML-% IJ SOLN
800.0000 [IU]/h | INTRAMUSCULAR | Status: DC
  Filled 2015-05-22: qty 250

## 2015-05-22 MED ORDER — METOPROLOL TARTRATE 1 MG/ML IV SOLN
5.0000 mg | Freq: Once | INTRAVENOUS | Status: AC
  Administered 2015-05-22: 5 mg via INTRAVENOUS

## 2015-05-22 MED ORDER — NITROGLYCERIN IN D5W 200-5 MCG/ML-% IV SOLN
0.0000 ug/min | INTRAVENOUS | Status: DC
  Administered 2015-05-22: 30 ug/min via INTRAVENOUS
  Filled 2015-05-22: qty 250

## 2015-05-22 MED ORDER — NITROGLYCERIN 0.4 MG SL SUBL
0.4000 mg | SUBLINGUAL_TABLET | SUBLINGUAL | Status: DC | PRN
Start: 2015-05-22 — End: 2015-05-24
  Administered 2015-05-22: 0.4 mg via SUBLINGUAL

## 2015-05-22 MED ORDER — CHLORHEXIDINE GLUCONATE 0.12 % MT SOLN
15.0000 mL | Freq: Two times a day (BID) | OROMUCOSAL | Status: DC
  Administered 2015-05-22 – 2015-05-24 (×4): 15 mL via OROMUCOSAL
  Filled 2015-05-22 (×5): qty 15

## 2015-05-22 MED ORDER — ONDANSETRON HCL 4 MG/2ML IJ SOLN: 4.0000 mg | Freq: Four times a day (QID) | INTRAMUSCULAR | Status: DC | PRN

## 2015-05-22 MED ORDER — LIDOCAINE HCL (PF) 1 % IJ SOLN
INTRAMUSCULAR | Status: DC | PRN
  Administered 2015-05-22: 16:00:00

## 2015-05-22 MED ORDER — HEPARIN BOLUS VIA INFUSION: 60.0000 [IU]/kg | Freq: Once | INTRAVENOUS | Status: DC

## 2015-05-22 MED ORDER — SODIUM CHLORIDE 0.9 % IJ SOLN: 3.0000 mL | INTRAMUSCULAR | Status: DC | PRN

## 2015-05-22 MED ORDER — LIDOCAINE HCL (PF) 1 % IJ SOLN
INTRAMUSCULAR | Status: AC
  Filled 2015-05-22: qty 30

## 2015-05-22 MED ORDER — ALPRAZOLAM 0.25 MG PO TABS
0.2500 mg | ORAL_TABLET | Freq: Once | ORAL | Status: AC
  Administered 2015-05-22: 20:00:00 0.25 mg via ORAL
  Filled 2015-05-22: qty 1

## 2015-05-22 MED ORDER — ASPIRIN 81 MG PO CHEW
324.0000 mg | CHEWABLE_TABLET | Freq: Once | ORAL | Status: AC
  Administered 2015-05-22: 324 mg via ORAL
  Filled 2015-05-22: qty 4

## 2015-05-22 MED ORDER — HEPARIN SODIUM (PORCINE) 5000 UNIT/ML IJ SOLN
5000.0000 [IU] | Freq: Three times a day (TID) | INTRAMUSCULAR | Status: DC
  Administered 2015-05-23 – 2015-05-24 (×4): 5000 [IU] via SUBCUTANEOUS
  Filled 2015-05-22 (×4): qty 1

## 2015-05-22 MED ORDER — BIVALIRUDIN 250 MG IV SOLR
INTRAVENOUS | Status: AC
  Filled 2015-05-22: qty 250

## 2015-05-22 MED ORDER — LEVOFLOXACIN IN D5W 750 MG/150ML IV SOLN
750.0000 mg | Freq: Once | INTRAVENOUS | Status: AC
  Administered 2015-05-22: 750 mg via INTRAVENOUS
  Filled 2015-05-22: qty 150

## 2015-05-22 MED ORDER — TICAGRELOR 90 MG PO TABS
90.0000 mg | ORAL_TABLET | Freq: Two times a day (BID) | ORAL | Status: DC
  Administered 2015-05-23: 03:00:00 90 mg via ORAL
  Filled 2015-05-22: qty 1

## 2015-05-22 MED ORDER — LEVOFLOXACIN IN D5W 750 MG/150ML IV SOLN
750.0000 mg | INTRAVENOUS | Status: DC
  Administered 2015-05-23: 750 mg via INTRAVENOUS
  Filled 2015-05-22: qty 150

## 2015-05-22 MED ORDER — SODIUM CHLORIDE 0.9 % IV SOLN
250.0000 mg | INTRAVENOUS | Status: DC | PRN
  Administered 2015-05-22: 1.75 mg/kg/h via INTRAVENOUS

## 2015-05-22 MED ORDER — NITROGLYCERIN IN D5W 200-5 MCG/ML-% IV SOLN
10.0000 ug/min | INTRAVENOUS | Status: DC
  Administered 2015-05-22: 10 ug/min via INTRAVENOUS
  Filled 2015-05-22: qty 250

## 2015-05-22 MED ORDER — IOHEXOL 350 MG/ML SOLN
INTRAVENOUS | Status: DC | PRN
  Administered 2015-05-22: 235 mL via INTRAVENOUS

## 2015-05-22 MED ORDER — SODIUM CHLORIDE 0.9 % IV SOLN
INTRAVENOUS | Status: DC
  Administered 2015-05-22: 12:00:00 via INTRAVENOUS

## 2015-05-22 MED ORDER — ASPIRIN EC 81 MG PO TBEC
81.0000 mg | DELAYED_RELEASE_TABLET | Freq: Every day | ORAL | Status: DC
Start: 2015-05-23 — End: 2015-05-22

## 2015-05-22 MED ORDER — ASPIRIN EC 81 MG PO TBEC
81.0000 mg | DELAYED_RELEASE_TABLET | Freq: Every day | ORAL | Status: DC
  Administered 2015-05-23 – 2015-05-24 (×2): 81 mg via ORAL
  Filled 2015-05-22 (×2): qty 1

## 2015-05-22 MED ORDER — MIDAZOLAM HCL 2 MG/2ML IJ SOLN
INTRAMUSCULAR | Status: DC | PRN
  Administered 2015-05-22: 2 mg via INTRAVENOUS
  Administered 2015-05-22: 1 mg via INTRAVENOUS

## 2015-05-22 MED ORDER — HEPARIN BOLUS VIA INFUSION
4000.0000 [IU] | Freq: Once | INTRAVENOUS | Status: AC
  Administered 2015-05-22: 4000 [IU] via INTRAVENOUS
  Filled 2015-05-22: qty 4000

## 2015-05-22 MED ORDER — SODIUM CHLORIDE 0.9 % IJ SOLN
3.0000 mL | INTRAMUSCULAR | Status: DC | PRN
Start: 2015-05-22 — End: 2015-05-22

## 2015-05-22 MED ORDER — ACETAMINOPHEN 325 MG PO TABS
650.0000 mg | ORAL_TABLET | ORAL | Status: DC | PRN
  Administered 2015-05-24: 650 mg via ORAL
  Filled 2015-05-22: qty 2

## 2015-05-22 MED ORDER — SODIUM CHLORIDE 0.9 % IV SOLN: INTRAVENOUS | Status: AC

## 2015-05-22 MED ORDER — ATORVASTATIN CALCIUM 40 MG PO TABS
40.0000 mg | ORAL_TABLET | Freq: Every day | ORAL | Status: DC
  Administered 2015-05-22: 40 mg via ORAL
  Filled 2015-05-22: qty 1

## 2015-05-22 MED ORDER — LABETALOL HCL 5 MG/ML IV SOLN
10.0000 mg | Freq: Once | INTRAVENOUS | Status: AC
  Administered 2015-05-22: 21:00:00 10 mg via INTRAVENOUS
  Filled 2015-05-22: qty 4

## 2015-05-22 MED ORDER — TICAGRELOR 90 MG PO TABS
ORAL_TABLET | ORAL | Status: DC | PRN
  Administered 2015-05-22: 180 mg via ORAL

## 2015-05-22 MED ORDER — SODIUM CHLORIDE 0.9 % IJ SOLN
3.0000 mL | Freq: Two times a day (BID) | INTRAMUSCULAR | Status: DC
  Administered 2015-05-22 – 2015-05-24 (×4): 3 mL via INTRAVENOUS

## 2015-05-22 MED ORDER — CETYLPYRIDINIUM CHLORIDE 0.05 % MT LIQD
7.0000 mL | Freq: Two times a day (BID) | OROMUCOSAL | Status: DC
  Administered 2015-05-22 – 2015-05-24 (×4): 7 mL via OROMUCOSAL

## 2015-05-22 MED ORDER — PNEUMOCOCCAL VAC POLYVALENT 25 MCG/0.5ML IJ INJ
0.5000 mL | INJECTION | INTRAMUSCULAR | Status: AC
  Administered 2015-05-23: 0.5 mL via INTRAMUSCULAR
  Filled 2015-05-22: qty 0.5

## 2015-05-22 MED ORDER — ACETAMINOPHEN 325 MG PO TABS
325.0000 mg | ORAL_TABLET | Freq: Once | ORAL | Status: AC
  Administered 2015-05-22: 325 mg via ORAL
  Filled 2015-05-22: qty 1

## 2015-05-22 SURGICAL SUPPLY — 23 items
BALLN ANGIOSCULPT RX 2.5X10 (BALLOONS) ×3
BALLN TREK RX 2.5X15 (BALLOONS) ×3
BALLN ~~LOC~~ EUPHORA RX 3.75X12 (BALLOONS) ×3
BALLN ~~LOC~~ TREK RX 3.25X8 (BALLOONS) ×3 IMPLANT
BALLOON ANGIOSCULPT RX 2.5X10 (BALLOONS) ×1 IMPLANT
BALLOON TREK RX 2.5X15 (BALLOONS) ×1 IMPLANT
BALLOON ~~LOC~~ EUPHORA RX 3.75X12 (BALLOONS) ×1 IMPLANT
CATH INFINITI 5FR MULTPACK ANG (CATHETERS) ×3 IMPLANT
CATH VISTA GUIDE 6FR XB3.5 (CATHETERS) ×3 IMPLANT
DEVICE RAD COMP TR BAND LRG (VASCULAR PRODUCTS) ×3 IMPLANT
GUIDE CATH RUNWAY 6FR FR4 (CATHETERS) ×3 IMPLANT
KIT ENCORE 26 ADVANTAGE (KITS) ×3 IMPLANT
KIT HEART LEFT (KITS) ×3 IMPLANT
PACK CARDIAC CATHETERIZATION (CUSTOM PROCEDURE TRAY) ×3 IMPLANT
SHEATH PINNACLE 5F 10CM (SHEATH) ×3 IMPLANT
SHEATH PINNACLE 6F 10CM (SHEATH) ×3 IMPLANT
STENT SYNERGY DES 3.5X16 (Permanent Stent) ×3 IMPLANT
STENT SYNERGY DES 3X12 (Permanent Stent) ×3 IMPLANT
SYR MEDRAD MARK V 150ML (SYRINGE) ×3 IMPLANT
TRANSDUCER W/STOPCOCK (MISCELLANEOUS) ×3 IMPLANT
TUBING CIL FLEX 10 FLL-RA (TUBING) ×3 IMPLANT
WIRE ASAHI PROWATER 180CM (WIRE) ×3 IMPLANT
WIRE EMERALD 3MM-J .035X150CM (WIRE) ×3 IMPLANT

## 2015-05-22 NOTE — ED Notes (Signed)
Cardiology at bedside.

## 2015-05-22 NOTE — Progress Notes (Signed)
Site area: right groin  Site Prior to Removal:  Level 0  Pressure Applied For 20 MINUTES    Minutes Beginning at 1840  Manual:   Yes.    Patient Status During Pull:  stable  Post Pull Groin Site:  Level 0  Post Pull Instructions Given:  Yes.    Post Pull Pulses Present:  Yes.    Dressing Applied:  Yes.    Comments:  Bruised area where suture placed, no other swelling, bruising or hematoma noted.

## 2015-05-22 NOTE — Interval H&P Note (Signed)
Cath Lab Visit (complete for each Cath Lab visit)  Clinical Evaluation Leading to the Procedure:   ACS: Yes.    Non-ACS:    Anginal Classification: CCS IV  Anti-ischemic medical therapy: Maximal Therapy (2 or more classes of medications)  Non-Invasive Test Results: No non-invasive testing performed  Prior CABG: No previous CABG      History and Physical Interval Note:  05/22/2015 1:41 PM  Jocelyn Shelton  has presented today for surgery, with the diagnosis of cp  The various methods of treatment have been discussed with the patient and family. After consideration of risks, benefits and other options for treatment, the patient has consented to  Procedure(s): Left Heart Cath and Coronary Angiography (N/A) as a surgical intervention .  The patient's history has been reviewed, patient examined, no change in status, stable for surgery.  I have reviewed the patient's chart and labs.  Questions were answered to the patient's satisfaction.     KELLY,THOMAS A

## 2015-05-22 NOTE — Progress Notes (Signed)
Consent signed for left cardiac catherization, video #115 cardiac cath, pci,stents watched by pt.

## 2015-05-22 NOTE — Progress Notes (Signed)
UR COMPLETED  

## 2015-05-22 NOTE — Progress Notes (Signed)
Right groin clipped for cardiac cath

## 2015-05-22 NOTE — H&P (View-Only) (Signed)
Primary Physician: Primary Cardiologist:  New   HPI:  Pt is a 59 yo with no prior cardiac history For at least 1 month describes chest pressure  Worse with actviy esp when bends.  Increased SOB with activity  Pressure comes and goes.  Usually every day.  Try to do stuff.  Slows Hurts in bed  Wakes in bed from sleep  Sweating  Occasional pain is pleuritic.   No productive cough  Yesterday vomited  Yesterday PM she was standing  Pain in chest got bad.  L arm and back  New.  Came in to ER for evaluation.  10/10 at worst.  This AM 4 /10  Some feverish feelings.  Dry cough BP in ER initially 188/109 Initial trop 0.18.  Repeat 5.93    Echo 01/2015 LVEF 60 to 65%      Past Medical History  Diagnosis Date  . Hypertension   . Reflux   . Scleroderma (HCC)   . Rheumatoid arthritis(714.0)      (Not in a hospital admission)   . metoprolol  5 mg Intravenous 4 times per day    Infusions: . heparin 800 Units/hr (05/22/15 0540)  . nitroGLYCERIN 10 mcg/min (05/22/15 0448)    Allergies  Allergen Reactions  . Aspirin Other (See Comments)    Stomach cramps  . Cymbalta [Duloxetine Hcl] Nausea Only    No appetite, stomach pain  . Tomato Rash    Social History   Social History  . Marital Status: Single    Spouse Name: N/A  . Number of Children: N/A  . Years of Education: N/A   Occupational History  . Not on file.   Social History Main Topics  . Smoking status: Current Every Day Smoker -- 0.50 packs/day for 40 years    Types: Cigarettes  . Smokeless tobacco: Not on file  . Alcohol Use: 2.0 oz/week    4 drink(s) per week  . Drug Use: No  . Sexual Activity: Not on file   Other Topics Concern  . Not on file   Social History Narrative    Family History  Problem Relation Age of Onset  . Hypertension Father   . Heart attack Mother   . Diabetes Daughter   . Diabetes Sister   . Lung cancer Brother 50    REVIEW OF SYSTEMS:  All systems reviewed  Negative to the  above problem except as noted above.    PHYSICAL EXAM: Filed Vitals:   05/22/15 0715  BP: 183/96  Pulse: 85  Temp:   Resp: 16    No intake or output data in the 24 hours ending 05/22/15 0730  General:  Thin 59 yo in NAD   No respiratory difficulty HEENT: normal Neck: supple. no JVD. Carotids 2+ bilat; no bruits. No lymphadenopathy or thryomegaly appreciated. Cor: PMI nondisplaced. Regular rate & rhythm. No rubs, gallops or murmurs. Lungs: Rales at bases, rhonchi LLL Abdomen: soft, nontender, nondistended. No hepatosplenomegaly. No bruits or masses. Good bowel sounds. Extremities: no cyanosis, clubbing, rash, edema Neuro: alert & oriented x 3, cranial nerves grossly intact. moves all 4 extremities w/o difficulty. Affect pleasant.  ECG:  4:35  SR 99 bpm  Sl ST depression V4/V5, I; Triv ST elevation III, AVF.    Results for orders placed or performed during the hospital encounter of 05/21/15 (from the past 24 hour(s))  CBC with Differential/Platelet     Status: Abnormal   Collection Time: 05/22/15 12:40 AM  Result Value  Ref Range   WBC 8.6 4.0 - 10.5 K/uL   RBC 3.98 3.87 - 5.11 MIL/uL   Hemoglobin 12.4 12.0 - 15.0 g/dL   HCT 28.4 13.2 - 44.0 %   MCV 97.5 78.0 - 100.0 fL   MCH 31.2 26.0 - 34.0 pg   MCHC 32.0 30.0 - 36.0 g/dL   RDW 10.2 (H) 72.5 - 36.6 %   Platelets 143 (L) 150 - 400 K/uL   Neutrophils Relative % 95 %   Neutro Abs 8.1 (H) 1.7 - 7.7 K/uL   Lymphocytes Relative 3 %   Lymphs Abs 0.3 (L) 0.7 - 4.0 K/uL   Monocytes Relative 2 %   Monocytes Absolute 0.2 0.1 - 1.0 K/uL   Eosinophils Relative 0 %   Eosinophils Absolute 0.0 0.0 - 0.7 K/uL   Basophils Relative 0 %   Basophils Absolute 0.0 0.0 - 0.1 K/uL  Comprehensive metabolic panel     Status: Abnormal   Collection Time: 05/22/15 12:40 AM  Result Value Ref Range   Sodium 132 (L) 135 - 145 mmol/L   Potassium 3.5 3.5 - 5.1 mmol/L   Chloride 95 (L) 101 - 111 mmol/L   CO2 29 22 - 32 mmol/L   Glucose, Bld 138 (H)  65 - 99 mg/dL   BUN 7 6 - 20 mg/dL   Creatinine, Ser 4.40 0.44 - 1.00 mg/dL   Calcium 8.6 (L) 8.9 - 10.3 mg/dL   Total Protein 7.9 6.5 - 8.1 g/dL   Albumin 3.3 (L) 3.5 - 5.0 g/dL   AST 26 15 - 41 U/L   ALT 12 (L) 14 - 54 U/L   Alkaline Phosphatase 84 38 - 126 U/L   Total Bilirubin 0.4 0.3 - 1.2 mg/dL   GFR calc non Af Amer 60 (L) >60 mL/min   GFR calc Af Amer >60 >60 mL/min   Anion gap 8 5 - 15  Brain natriuretic peptide     Status: Abnormal   Collection Time: 05/22/15 12:40 AM  Result Value Ref Range   B Natriuretic Peptide 175.4 (H) 0.0 - 100.0 pg/mL  Troponin I     Status: Abnormal   Collection Time: 05/22/15 12:40 AM  Result Value Ref Range   Troponin I 0.18 (H) <0.031 ng/mL  Lipase, blood     Status: None   Collection Time: 05/22/15 12:40 AM  Result Value Ref Range   Lipase 28 11 - 51 U/L  Urinalysis, Routine w reflex microscopic (not at [facility])     Status: Abnormal   Collection Time: 05/22/15  2:27 AM  Result Value Ref Range   Color, Urine YELLOW YELLOW   APPearance CLOUDY (A) CLEAR   Specific Gravity, Urine 1.015 1.005 - 1.030   pH 6.0 5.0 - 8.0   Glucose, UA NEGATIVE NEGATIVE mg/dL   Hgb urine dipstick NEGATIVE NEGATIVE   Bilirubin Urine NEGATIVE NEGATIVE   Ketones, ur NEGATIVE NEGATIVE mg/dL   Protein, ur NEGATIVE NEGATIVE mg/dL   Urobilinogen, UA 1.0 0.0 - 1.0 mg/dL   Nitrite NEGATIVE NEGATIVE   Leukocytes, UA SMALL (A) NEGATIVE  Urine microscopic-add on     Status: Abnormal   Collection Time: 05/22/15  2:27 AM  Result Value Ref Range   Squamous Epithelial / LPF FEW (A) RARE   WBC, UA 7-10 <3 WBC/hpf   RBC / HPF 0-2 <3 RBC/hpf   Bacteria, UA FEW (A) RARE  I-stat troponin, ED     Status: Abnormal   Collection Time: 05/22/15  3:56 AM  Result Value Ref Range   Troponin i, poc 1.15 (HH) 0.00 - 0.08 ng/mL   Comment NOTIFIED PHYSICIAN    Comment 3          CBC     Status: Abnormal   Collection Time: 05/22/15  4:56 AM  Result Value Ref Range   WBC 8.4 4.0 -  10.5 K/uL   RBC 3.98 3.87 - 5.11 MIL/uL   Hemoglobin 12.7 12.0 - 15.0 g/dL   HCT 35.7 01.7 - 79.3 %   MCV 97.0 78.0 - 100.0 fL   MCH 31.9 26.0 - 34.0 pg   MCHC 32.9 30.0 - 36.0 g/dL   RDW 90.3 (H) 00.9 - 23.3 %   Platelets 152 150 - 400 K/uL  Troponin I (q 6hr x 3)     Status: Abnormal   Collection Time: 05/22/15  6:14 AM  Result Value Ref Range   Troponin I 5.93 (HH) <0.031 ng/mL   Dg Chest 2 View  05/22/2015  CLINICAL DATA:  59 year old female with history of cough for 1 week. EXAM: CHEST  2 VIEW COMPARISON:  Chest x-ray 02/10/2015. FINDINGS: Opacity throughout the lingula, concerning for pneumonia. Small left pleural effusion. Right lung is clear. No evidence of pulmonary edema. Heart size is normal. Upper mediastinal contours are within normal limits. Atherosclerosis in the thoracic aorta. IMPRESSION: 1. Lingular airspace consolidation concerning for pneumonia. Small left parapneumonic pleural effusion. Followup PA and lateral chest X-ray is recommended in 3-4 weeks following trial of antibiotic therapy to ensure resolution and exclude underlying malignancy. 2. Atherosclerosis. Electronically Signed   By: Trudie Reed M.D.   On: 05/22/2015 00:13     ASSESSMENT:   60 yo with no knwn CAD though CXR shows some atherosclerosis of aorta. Presents with several week hx of chest pain.  Pain comes and goes  WOrse yesterday.  Initial trop minimally increased  Second was 5.93  CXR with L lingular pneumonia.   EKG with Triv elevation in III, AVF.  Does not appear signif  SL ST dep   Echo Bedside shows LVEF normal  No wall motion abnormalities  No effusion  I have given her lopressor 5 mg and MSO4 and discomfort nearly gone.    Would recomm repeating troponin to comfirm  On Heparin and NTg  Discussed risks/benefits of L heart cath  Would recomm if repeat trop accurate.  ? Of timing.     Agree with ABX   Check lipids in AM  SHould be on statin.    WIll follow.   Keep NPO until trop  back  [employer]

## 2015-05-22 NOTE — H&P (Signed)
Family Medicine Teaching [facility] Admission History and Physical Service Pager: [phone]  Patient name: Jocelyn Shelton Medical record number: [id number] Date of birth: 12/11/1955 Age: 59 y.o. Gender: female  Primary Care Provider: Saralyn Pilar, DO Consultants: cardiology Code Status: full  Chief Complaint: chest pain  Assessment and Plan: Jocelyn Shelton is a 59 y.o. female presenting via EMS with chest pan . PMH is significant for HTN, GERD, RA, and scleroderma.   NSTEMI: typical chest pain. EKG with ST depression in lateral leads and acutely rising troponin suggestive for NSTEMI. BP elevated to 188/109 on arrival to ED. CHF is on differential given orthopnea, cough, crackles bilaterally and elevated BNP to 175. However, exam without JVD and LE. Subjective fever, chill, cough and laterality of crackles in immunosuppressed patient is also concerning for pneumonia. CXR with lingular airspace consolidation and small left parapneumonic pleural effusion. However, CBC without leukocytosis and no objective fever. PE is considered but wells score only 1.5 with tachycardia. -Admit to step down. Attending Dr. Lum Babe -Cycle troponin -Repeat EKG  -Chewable ASA 324 stat, then 81 mg dialy -Nitroglycerin drip -Heparin drip -Metoprolol IV -follow card recs  -risk stratification labs  Hypertensive emergency: BP 188/109 on arrival. Down trending after IV metoprolol. - Continue NTG gtt -Continue metoprolol IV  HFpEF: patient with new orthopnea, crackles and chest pain. Echo 01/2015 with EF 60-65%, G1DD, and PAPP of 43 mmHg. However, no JVD or LE edema. -strict I&O -daily weight -monitor resp status. Consider lasix if worse -can consider another Echo  Pneumonia: patient with subjective fever, chill, cough and crackles on lung exam (left>right). CXR with in immunosuppressed patient is also concerning for pneumonia. CXR with lingular airspace consolidation and small left parapneumonic  pleural effusion. Patient with RA and scleroderma. She is immunosuppressed with methotrexate and prednisone. Her symptoms could also be due to rheumatoid pneumonitis/ILD vs. MTX-induced lung dz/interstitial pneumonitis. She has history of dry cough and dyspnea from pleural effusion. Status post Levaquin in ED. -Continue Levaquin. Monitor for EKG changes. QTc 464. -Low threshold for broadening antibiotic if objective fever given history of immune suppression. -Oxygen prn  Hyponatremia: Na 132 -will recheck Bmet  -Consider gentle IV NS if no improvement  Rheumatoid arthritis: followed by Dr. Kellie Simmering, on MTX, folate, and prednisone.  -hold off home meds for now -Will touch base with Dr. Kellie Simmering in am  FEN/GI:  -NPO  -NS at   Prophylaxis: on heparin drip  Disposition: step down for NSTEMI  History of Present Illness:  Jocelyn Shelton is a 59 y.o. female presenting with chest pain. PMH is significant for HTN, GERD, RA, and scleroderma.  Patient has chest pain for about two weeks that has gotten worse last night. Chest pain initially started in her throat life her reflex. Now it is from her throat down to her epigastric area. She describes her pain as burning, worse on exertion and with recumbency. Last night her pain gotten worse, 10/10. She says she never had pain like this before. Pain radiates to her arms and left jaw. She also has history RA. She reports diaphoresis and shortness of breath with exersion. She has been orthopneic recently. She also has cough which is usually dry. Reports one episode of NBNB emesis yesterday morning. She is not nauseous now. She reports subjective fever and chill as well. She denies recent changes to her medication or missing doses. Patient reports her pain is better now after nitroglycerin and gi cocktail.  She smokes about half a  pack a day. Denies EtOH and recreational drug use.   Review Of Systems: Per HPI Otherwise 12 point review of systems was  performed and was unremarkable.  Patient Active Problem List   Diagnosis Date Noted  . Chest pain   . Hypoxia   . Abdominal pain, acute   . Volume overload   . Pleural effusion 02/08/2015  . Tachycardia   . Allergic rhinitis 12/03/2014  . Tendinitis of right rotator cuff 12/03/2014  . Chronic pain syndrome 12/03/2014  . Diabetes mellitus screening 03/26/2014  . Chronic foot pain 03/26/2014  . Plantar wart of right foot 03/26/2014  . Rheumatoid arthritis (HCC) 03/14/2013  . Anemia 03/02/2013  . Other pancytopenia (HCC) 03/01/2013  . Thrombocytopenia, unspecified (HCC) 03/01/2013  . Facial rash 02/02/2012  . 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 Erythema nodosum 04/27/2011  . GASTROESOPHAGEAL REFLUX DISEASE 08/02/2008  . CORNS AND CALLOSITIES 03/18/2007  . Major depressive disorder, recurrent episode (HCC) 09/23/2006  . Tobacco abuse 09/23/2006  . HYPERTENSION, BENIGN SYSTEMIC 09/23/2006  . RAYNAUDS SYNDROME 09/23/2006  . CLAUDICATION, INTERMITTENT 09/23/2006  . OSTEOARTHRITIS, MULTI SITES 09/23/2006   Past Medical History: Past Medical History  Diagnosis Date  . Hypertension   . Reflux   . Scleroderma (HCC)   . Rheumatoid arthritis(714.0)    Past Surgical History: Past Surgical History  Procedure Laterality Date  . Appendectomy  90's   Social History: Social History  Substance Use Topics  . Smoking status: Current Every Day Smoker -- 0.50 packs/day for 40 years    Types: Cigarettes  . Smokeless tobacco: None  . Alcohol Use: 2.0 oz/week    4 drink(s) per week   Additional social history: smokes about half a pack a day, denies EtOH and recreational drug use Please also refer to relevant sections of EMR.  Family History: Family History  Problem Relation Age of Onset  . Hypertension Father   . Heart attack Mother   . Diabetes Daughter   . Diabetes Sister   . Lung cancer Brother 50   Allergies and Medications: Allergies  Allergen Reactions  . Aspirin Other (See  Comments)    Stomach cramps  . Cymbalta [Duloxetine Hcl] Nausea Only    No appetite, stomach pain  . Tomato Rash   No current facility-administered medications on file prior to encounter.   Current Outpatient Prescriptions on File Prior to Encounter  Medication Sig Dispense Refill  . acetaminophen (TYLENOL) 500 MG tablet Take 1,000 mg by mouth daily as needed for pain.     . ATROVENT HFA 17 MCG/ACT inhaler INHALE TWO PUFFS BY MOUTH ONCE DAILY AS NEEDED FOR WHEEZING 13 g 2  . bismuth subsalicylate (PEPTO BISMOL) 262 MG/15ML suspension Take 30 mLs by mouth every 6 (six) hours as needed for indigestion.    Marland Kitchen CALCIUM PO Take 1 tablet by mouth daily.    . cetirizine (ZYRTEC) 10 MG tablet Take 1 tablet (10 mg total) by mouth daily. 30 tablet 6  . DULoxetine (CYMBALTA) 60 MG capsule Take 1 capsule (60 mg total) by mouth daily. (Patient not taking: Reported on 02/25/2015) 30 capsule 2  . fluticasone (FLONASE) 50 MCG/ACT nasal spray Place 2 sprays into both nostrils daily as needed for rhinitis. 16 g 1  . folic acid (FOLVITE) 1 MG tablet TAKE 1 TABLET (1 MG TOTAL) BY MOUTH DAILY. 30 tablet 6  . hydrochlorothiazide (HYDRODIURIL) 25 MG tablet Take 1 tablet (25 mg total) by mouth daily. 90 tablet 0  . ibuprofen (ADVIL,MOTRIN) 200  MG tablet Take 200 mg by mouth every 6 (six) hours as needed for mild pain or moderate pain.    . meloxicam (MOBIC) 15 MG tablet TAKE ONE TABLET BY MOUTH ONCE DAILY 30 tablet 1  . methotrexate (RHEUMATREX) 2.5 MG tablet Take 5 tablets (12.5 mg total) by mouth 2 (two) times a week. Take 5 tablets every Mon and Tues. Caution:Chemotherapy. Protect from light. 40 tablet 0  . metoprolol (LOPRESSOR) 50 MG tablet TAKE ONE TABLET BY MOUTH TWICE DAILY 180 tablet 2  . Multiple Vitamin (MULTIVITAMIN WITH MINERALS) TABS tablet Take 1 tablet by mouth daily.    Marland Kitchen OLANZapine (ZYPREXA) 5 MG tablet Take 1 tablet (5 mg total) by mouth at bedtime. 30 tablet 11  . omeprazole (PRILOSEC) 40 MG capsule  TAKE ONE CAPSULE BY MOUTH ONCE DAILY 90 capsule 2  . predniSONE (DELTASONE) 5 MG tablet Take 1 tablet (5 mg total) by mouth daily. 30 tablet 1  . verapamil (CALAN) 80 MG tablet TAKE ONE TABLET BY MOUTH THREE TIMES DAILY 90 tablet 0    Objective: BP 152/96 mmHg  Pulse 99  Temp(Src) 97.8 F (36.6 C) (Oral)  Resp 22  SpO2 96% Exam: Gen: siting in bed, appear tired  Eyes: PERRLA, sclera anicteric Nares: clear, no erythema, swelling or congestion Oropharynx: fish mouth, clear, moist CVS: RRR. S1 & S2 audible, no murmurs. 2+ radial and DP pulses bilaterally. No JVD or edema Resp: no apparent WOB, crackle bilaterally to mid lung regions (left > right). Abd: +BS. Soft, NDNT, no rebound or guarding.  Ext: No edema or gross deformities. Dry and shiny skin over digits Neuro: Alert and oriented, No gross focal deficits Labs and Imaging: CBC BMET   Recent Labs Lab 05/22/15 0456  WBC 8.4  HGB 12.7  HCT 38.6  PLT 152    Recent Labs Lab 05/22/15 0040  NA 132*  K 3.5  CL 95*  CO2 29  BUN 7  CREATININE 1.00  GLUCOSE 138*  CALCIUM 8.6*     Dg Chest 2 View  05/22/2015  CLINICAL DATA:  59 year old female with history of cough for 1 week. EXAM: CHEST  2 VIEW COMPARISON:  Chest x-ray 02/10/2015. FINDINGS: Opacity throughout the lingula, concerning for pneumonia. Small left pleural effusion. Right lung is clear. No evidence of pulmonary edema. Heart size is normal. Upper mediastinal contours are within normal limits. Atherosclerosis in the thoracic aorta. IMPRESSION: 1. Lingular airspace consolidation concerning for pneumonia. Small left parapneumonic pleural effusion. Followup PA and lateral chest X-ray is recommended in 3-4 weeks following trial of antibiotic therapy to ensure resolution and exclude underlying malignancy. 2. Atherosclerosis. Electronically Signed   By: Trudie Reed M.D.   On: 05/22/2015 00:13    Almon Hercules, MD 05/22/2015, 5:51 AM PGY-1, [place] [facility] FPTS Intern pager: [phone], text pages welcome  I have seen and evaluated the patient with Dr. Alanda Slim. I am in agreement with the note above in its revised form. My additions are in red.  Dominic Rhome B. Jarvis Newcomer, MD, PGY-3 05/22/2015 8:26 AM

## 2015-05-22 NOTE — Progress Notes (Signed)
Report given to rn on 6C. Belongings (clothing) brought to 6c.

## 2015-05-22 NOTE — Consult Note (Signed)
Primary Physician: Primary Cardiologist:  New   HPI:  Pt is a 59 yo with no prior cardiac history For at least 1 month describes chest pressure  Worse with actviy esp when bends.  Increased SOB with activity  Pressure comes and goes.  Usually every day.  Try to do stuff.  Slows Hurts in bed  Wakes in bed from sleep  Sweating  Occasional pain is pleuritic.   No productive cough  Yesterday vomited  Yesterday PM she was standing  Pain in chest got bad.  L arm and back  New.  Came in to ER for evaluation.  10/10 at worst.  This AM 4 /10  Some feverish feelings.  Dry cough BP in ER initially 188/109 Initial trop 0.18.  Repeat 5.93    Echo 01/2015 LVEF 60 to 65%      Past Medical History  Diagnosis Date  . Hypertension   . Reflux   . Scleroderma (HCC)   . Rheumatoid arthritis(714.0)      (Not in a hospital admission)   . metoprolol  5 mg Intravenous 4 times per day    Infusions: . heparin 800 Units/hr (05/22/15 0540)  . nitroGLYCERIN 10 mcg/min (05/22/15 0448)    Allergies  Allergen Reactions  . Aspirin Other (See Comments)    Stomach cramps  . Cymbalta [Duloxetine Hcl] Nausea Only    No appetite, stomach pain  . Tomato Rash    Social History   Social History  . Marital Status: Single    Spouse Name: N/A  . Number of Children: N/A  . Years of Education: N/A   Occupational History  . Not on file.   Social History Main Topics  . Smoking status: Current Every Day Smoker -- 0.50 packs/day for 40 years    Types: Cigarettes  . Smokeless tobacco: Not on file  . Alcohol Use: 2.0 oz/week    4 drink(s) per week  . Drug Use: No  . Sexual Activity: Not on file   Other Topics Concern  . Not on file   Social History Narrative    Family History  Problem Relation Age of Onset  . Hypertension Father   . Heart attack Mother   . Diabetes Daughter   . Diabetes Sister   . Lung cancer Brother 50    REVIEW OF SYSTEMS:  All systems reviewed  Negative to the  above problem except as noted above.    PHYSICAL EXAM: Filed Vitals:   05/22/15 0715  BP: 183/96  Pulse: 85  Temp:   Resp: 16    No intake or output data in the 24 hours ending 05/22/15 0730  General:  Thin 59 yo in NAD   No respiratory difficulty HEENT: normal Neck: supple. no JVD. Carotids 2+ bilat; no bruits. No lymphadenopathy or thryomegaly appreciated. Cor: PMI nondisplaced. Regular rate & rhythm. No rubs, gallops or murmurs. Lungs: Rales at bases, rhonchi LLL Abdomen: soft, nontender, nondistended. No hepatosplenomegaly. No bruits or masses. Good bowel sounds. Extremities: no cyanosis, clubbing, rash, edema Neuro: alert & oriented x 3, cranial nerves grossly intact. moves all 4 extremities w/o difficulty. Affect pleasant.  ECG:  4:35  SR 99 bpm  Sl ST depression V4/V5, I; Triv ST elevation III, AVF.    Results for orders placed or performed during the hospital encounter of 05/21/15 (from the past 24 hour(s))  CBC with Differential/Platelet     Status: Abnormal   Collection Time: 05/22/15 12:40 AM  Result Value  Ref Range   WBC 8.6 4.0 - 10.5 K/uL   RBC 3.98 3.87 - 5.11 MIL/uL   Hemoglobin 12.4 12.0 - 15.0 g/dL   HCT 28.4 13.2 - 44.0 %   MCV 97.5 78.0 - 100.0 fL   MCH 31.2 26.0 - 34.0 pg   MCHC 32.0 30.0 - 36.0 g/dL   RDW 10.2 (H) 72.5 - 36.6 %   Platelets 143 (L) 150 - 400 K/uL   Neutrophils Relative % 95 %   Neutro Abs 8.1 (H) 1.7 - 7.7 K/uL   Lymphocytes Relative 3 %   Lymphs Abs 0.3 (L) 0.7 - 4.0 K/uL   Monocytes Relative 2 %   Monocytes Absolute 0.2 0.1 - 1.0 K/uL   Eosinophils Relative 0 %   Eosinophils Absolute 0.0 0.0 - 0.7 K/uL   Basophils Relative 0 %   Basophils Absolute 0.0 0.0 - 0.1 K/uL  Comprehensive metabolic panel     Status: Abnormal   Collection Time: 05/22/15 12:40 AM  Result Value Ref Range   Sodium 132 (L) 135 - 145 mmol/L   Potassium 3.5 3.5 - 5.1 mmol/L   Chloride 95 (L) 101 - 111 mmol/L   CO2 29 22 - 32 mmol/L   Glucose, Bld 138 (H)  65 - 99 mg/dL   BUN 7 6 - 20 mg/dL   Creatinine, Ser 4.40 0.44 - 1.00 mg/dL   Calcium 8.6 (L) 8.9 - 10.3 mg/dL   Total Protein 7.9 6.5 - 8.1 g/dL   Albumin 3.3 (L) 3.5 - 5.0 g/dL   AST 26 15 - 41 U/L   ALT 12 (L) 14 - 54 U/L   Alkaline Phosphatase 84 38 - 126 U/L   Total Bilirubin 0.4 0.3 - 1.2 mg/dL   GFR calc non Af Amer 60 (L) >60 mL/min   GFR calc Af Amer >60 >60 mL/min   Anion gap 8 5 - 15  Brain natriuretic peptide     Status: Abnormal   Collection Time: 05/22/15 12:40 AM  Result Value Ref Range   B Natriuretic Peptide 175.4 (H) 0.0 - 100.0 pg/mL  Troponin I     Status: Abnormal   Collection Time: 05/22/15 12:40 AM  Result Value Ref Range   Troponin I 0.18 (H) <0.031 ng/mL  Lipase, blood     Status: None   Collection Time: 05/22/15 12:40 AM  Result Value Ref Range   Lipase 28 11 - 51 U/L  Urinalysis, Routine w reflex microscopic (not at [facility])     Status: Abnormal   Collection Time: 05/22/15  2:27 AM  Result Value Ref Range   Color, Urine YELLOW YELLOW   APPearance CLOUDY (A) CLEAR   Specific Gravity, Urine 1.015 1.005 - 1.030   pH 6.0 5.0 - 8.0   Glucose, UA NEGATIVE NEGATIVE mg/dL   Hgb urine dipstick NEGATIVE NEGATIVE   Bilirubin Urine NEGATIVE NEGATIVE   Ketones, ur NEGATIVE NEGATIVE mg/dL   Protein, ur NEGATIVE NEGATIVE mg/dL   Urobilinogen, UA 1.0 0.0 - 1.0 mg/dL   Nitrite NEGATIVE NEGATIVE   Leukocytes, UA SMALL (A) NEGATIVE  Urine microscopic-add on     Status: Abnormal   Collection Time: 05/22/15  2:27 AM  Result Value Ref Range   Squamous Epithelial / LPF FEW (A) RARE   WBC, UA 7-10 <3 WBC/hpf   RBC / HPF 0-2 <3 RBC/hpf   Bacteria, UA FEW (A) RARE  I-stat troponin, ED     Status: Abnormal   Collection Time: 05/22/15  3:56 AM  Result Value Ref Range   Troponin i, poc 1.15 (HH) 0.00 - 0.08 ng/mL   Comment NOTIFIED PHYSICIAN    Comment 3          CBC     Status: Abnormal   Collection Time: 05/22/15  4:56 AM  Result Value Ref Range   WBC 8.4 4.0 -  10.5 K/uL   RBC 3.98 3.87 - 5.11 MIL/uL   Hemoglobin 12.7 12.0 - 15.0 g/dL   HCT 67.7 03.4 - 03.5 %   MCV 97.0 78.0 - 100.0 fL   MCH 31.9 26.0 - 34.0 pg   MCHC 32.9 30.0 - 36.0 g/dL   RDW 24.8 (H) 18.5 - 90.9 %   Platelets 152 150 - 400 K/uL  Troponin I (q 6hr x 3)     Status: Abnormal   Collection Time: 05/22/15  6:14 AM  Result Value Ref Range   Troponin I 5.93 (HH) <0.031 ng/mL   Dg Chest 2 View  05/22/2015  CLINICAL DATA:  59 year old female with history of cough for 1 week. EXAM: CHEST  2 VIEW COMPARISON:  Chest x-ray 02/10/2015. FINDINGS: Opacity throughout the lingula, concerning for pneumonia. Small left pleural effusion. Right lung is clear. No evidence of pulmonary edema. Heart size is normal. Upper mediastinal contours are within normal limits. Atherosclerosis in the thoracic aorta. IMPRESSION: 1. Lingular airspace consolidation concerning for pneumonia. Small left parapneumonic pleural effusion. Followup PA and lateral chest X-ray is recommended in 3-4 weeks following trial of antibiotic therapy to ensure resolution and exclude underlying malignancy. 2. Atherosclerosis. Electronically Signed   By: Trudie Reed M.D.   On: 05/22/2015 00:13     ASSESSMENT:   59 yo with no knwn CAD though CXR shows some atherosclerosis of aorta. Presents with several week hx of chest pain.  Pain comes and goes  WOrse yesterday.  Initial trop minimally increased  Second was 5.93  CXR with L lingular pneumonia.   EKG with Triv elevation in III, AVF.  Does not appear signif  SL ST dep   Echo Bedside shows LVEF normal  No wall motion abnormalities  No effusion  I have given her lopressor 5 mg and MSO4 and discomfort nearly gone.    Would recomm repeating troponin to comfirm  On Heparin and NTg  Discussed risks/benefits of L heart cath  Would recomm if repeat trop accurate.  ? Of timing.     Agree with ABX   Check lipids in AM  SHould be on statin.    WIll follow.   Keep NPO until trop  back  [employer]

## 2015-05-22 NOTE — Progress Notes (Signed)
FMTS ATTENDING  NOTE Jocelyn Doughten,MD Patient remains stable. I reviewed her repeat troponin, it went up from 5 to 15. I discussed patient with the cardiologist, she might potentially be taken down for catheterization today.

## 2015-05-22 NOTE — Progress Notes (Signed)
ANTICOAGULATION CONSULT NOTE - Initial Consult  Pharmacy Consult for Heparin  Indication: chest pain/ACS  Allergies  Allergen Reactions  . Aspirin Other (See Comments)    Stomach cramps  . Cymbalta [Duloxetine Hcl] Nausea Only    No appetite, stomach pain  . Tomato Rash    Patient Measurements: ~68 kg   Vital Signs: Temp: 97.8 F (36.6 C) (10/25 2321) Temp Source: Oral (10/25 2321) BP: 163/98 mmHg (10/26 0345) Pulse Rate: 96 (10/26 0345)  Labs:  Recent Labs  05/22/15 0040  HGB 12.4  HCT 38.8  PLT 143*  CREATININE 1.00  TROPONINI 0.18*    CrCl cannot be calculated (Unknown ideal weight.).   Medical History: Past Medical History  Diagnosis Date  . Hypertension   . Reflux   . Scleroderma (HCC)   . Rheumatoid arthritis(714.0)    Assessment: 59 y/o F with mildly elevated troponin, starting heparin per pharmacy, Hgb 12.4, Plts low at 143, other labs/meds reviewed.   Goal of Therapy:  Heparin level 0.3-0.7 units/ml Monitor platelets by anticoagulation protocol: Yes   Plan:  -Heparin 4000 units BOLUS -Start heparin drip at 800 units/hr -1300 HL -Daily CBC/HL -Monitor for bleeding  Abran Duke 05/22/2015,4:41 AM

## 2015-05-23 ENCOUNTER — Encounter (HOSPITAL_COMMUNITY): Payer: Self-pay | Admitting: Cardiovascular Disease

## 2015-05-23 DIAGNOSIS — I2510 Atherosclerotic heart disease of native coronary artery without angina pectoris: Secondary | ICD-10-CM

## 2015-05-23 DIAGNOSIS — J90 Pleural effusion, not elsewhere classified: Secondary | ICD-10-CM

## 2015-05-23 DIAGNOSIS — I10 Essential (primary) hypertension: Secondary | ICD-10-CM

## 2015-05-23 DIAGNOSIS — Z95820 Peripheral vascular angioplasty status with implants and grafts: Secondary | ICD-10-CM

## 2015-05-23 DIAGNOSIS — J189 Pneumonia, unspecified organism: Secondary | ICD-10-CM

## 2015-05-23 DIAGNOSIS — T50905A Adverse effect of unspecified drugs, medicaments and biological substances, initial encounter: Secondary | ICD-10-CM | POA: Diagnosis not present

## 2015-05-23 HISTORY — DX: Atherosclerotic heart disease of native coronary artery without angina pectoris: I25.10

## 2015-05-23 LAB — BASIC METABOLIC PANEL
ANION GAP: 6 (ref 5–15)
Anion gap: 8 (ref 5–15)
BUN: <5 mg/dL — ABNORMAL LOW (ref 6–20)
CHLORIDE: 99 mmol/L — AB (ref 101–111)
CO2: 28 mmol/L (ref 22–32)
CO2: 27 mmol/L (ref 22–32)
CREATININE: 0.74 mg/dL (ref 0.44–1.00)
Calcium: 7.9 mg/dL — ABNORMAL LOW (ref 8.9–10.3)
Calcium: 8 mg/dL — ABNORMAL LOW (ref 8.9–10.3)
Chloride: 100 mmol/L — ABNORMAL LOW (ref 101–111)
Creatinine, Ser: 0.72 mg/dL (ref 0.44–1.00)
GFR calc Af Amer: >60 mL/min (ref >60)
GFR calc non Af Amer: >60 mL/min (ref >60)
GLUCOSE: 130 mg/dL — AB (ref 65–99)
GLUCOSE: 96 mg/dL (ref 65–99)
POTASSIUM: 3 mmol/L — AB (ref 3.5–5.1)
POTASSIUM: 2.9 mmol/L — AB (ref 3.5–5.1)
SODIUM: 134 mmol/L — AB (ref 135–145)
Sodium: 134 mmol/L — ABNORMAL LOW (ref 135–145)

## 2015-05-23 LAB — POCT ACTIVATED CLOTTING TIME: ACTIVATED CLOTTING TIME: 650 s

## 2015-05-23 LAB — LIPID PANEL
CHOLESTEROL: 128 mg/dL (ref 0–200)
HDL: 40 mg/dL — ABNORMAL LOW (ref >40)
LDL CALC: 76 mg/dL (ref 0–99)
TRIGLYCERIDES: 62 mg/dL (ref <150)
Total CHOL/HDL Ratio: 3.2 RATIO
VLDL: 12 mg/dL (ref 0–40)

## 2015-05-23 LAB — CBC
HCT: 34.6 % — ABNORMAL LOW (ref 36.0–46.0)
Hemoglobin: 11.1 g/dL — ABNORMAL LOW (ref 12.0–15.0)
MCH: 31.4 pg (ref 26.0–34.0)
MCHC: 32.1 g/dL (ref 30.0–36.0)
MCV: 98 fL (ref 78.0–100.0)
Platelets: 148 10*3/uL — ABNORMAL LOW (ref 150–400)
RBC: 3.53 MIL/uL — ABNORMAL LOW (ref 3.87–5.11)
RDW: 16.5 % — AB (ref 11.5–15.5)
WBC: 7 10*3/uL (ref 4.0–10.5)

## 2015-05-23 LAB — HEMOGLOBIN A1C
HEMOGLOBIN A1C: 5.3 % (ref 4.8–5.6)
MEAN PLASMA GLUCOSE: 105 mg/dL

## 2015-05-23 LAB — BRAIN NATRIURETIC PEPTIDE: B NATRIURETIC PEPTIDE 5: 363.8 pg/mL — AB (ref 0.0–100.0)

## 2015-05-23 MED ORDER — POTASSIUM CHLORIDE CRYS ER 20 MEQ PO TBCR
40.0000 meq | EXTENDED_RELEASE_TABLET | Freq: Once | ORAL | Status: AC
  Administered 2015-05-23: 40 meq via ORAL
  Filled 2015-05-23: qty 2

## 2015-05-23 MED ORDER — LEVOFLOXACIN 750 MG PO TABS
750.0000 mg | ORAL_TABLET | Freq: Every day | ORAL | Status: DC
  Administered 2015-05-24: 750 mg via ORAL
  Filled 2015-05-23 (×2): qty 1

## 2015-05-23 MED ORDER — POTASSIUM CHLORIDE CRYS ER 20 MEQ PO TBCR: 40.0000 meq | EXTENDED_RELEASE_TABLET | Freq: Once | ORAL | Status: DC

## 2015-05-23 MED ORDER — HEART ATTACK BOUNCING BOOK
Freq: Once | Status: AC
  Administered 2015-05-23: 03:00:00
  Filled 2015-05-23: qty 1

## 2015-05-23 MED ORDER — PRASUGREL HCL 10 MG PO TABS
10.0000 mg | ORAL_TABLET | Freq: Every day | ORAL | Status: DC
  Administered 2015-05-23 – 2015-05-24 (×2): 10 mg via ORAL
  Filled 2015-05-23 (×2): qty 1

## 2015-05-23 MED ORDER — DULOXETINE HCL 60 MG PO CPEP
60.0000 mg | ORAL_CAPSULE | Freq: Every day | ORAL | Status: DC
  Administered 2015-05-23: 10:00:00 60 mg via ORAL
  Filled 2015-05-23 (×2): qty 1

## 2015-05-23 MED ORDER — ATORVASTATIN CALCIUM 40 MG PO TABS
40.0000 mg | ORAL_TABLET | Freq: Every day | ORAL | Status: DC
  Administered 2015-05-23: 40 mg via ORAL
  Filled 2015-05-23: qty 1

## 2015-05-23 MED ORDER — ENSURE ENLIVE PO LIQD
237.0000 mL | Freq: Two times a day (BID) | ORAL | Status: DC
  Administered 2015-05-24: 237 mL via ORAL
  Filled 2015-05-23 (×2): qty 237

## 2015-05-23 MED ORDER — GUAIFENESIN-DM 100-10 MG/5ML PO SYRP
15.0000 mL | ORAL_SOLUTION | ORAL | Status: DC | PRN
  Administered 2015-05-23: 02:00:00 15 mL via ORAL
  Filled 2015-05-23: qty 15

## 2015-05-23 MED ORDER — HYDROCHLOROTHIAZIDE 25 MG PO TABS
25.0000 mg | ORAL_TABLET | Freq: Every day | ORAL | Status: DC
Start: 2015-05-23 — End: 2015-05-23

## 2015-05-23 MED ORDER — TRIAMTERENE-HCTZ 75-50 MG PO TABS
0.5000 | ORAL_TABLET | Freq: Every day | ORAL | Status: DC
  Administered 2015-05-23 – 2015-05-24 (×2): 0.5 via ORAL
  Filled 2015-05-23 (×2): qty 0.5

## 2015-05-23 MED ORDER — METOPROLOL TARTRATE 50 MG PO TABS
50.0000 mg | ORAL_TABLET | Freq: Two times a day (BID) | ORAL | Status: DC
  Administered 2015-05-23 – 2015-05-24 (×3): 50 mg via ORAL
  Filled 2015-05-23: qty 1
  Filled 2015-05-23: qty 2
  Filled 2015-05-23: qty 1

## 2015-05-23 MED ORDER — OLANZAPINE 5 MG PO TABS
5.0000 mg | ORAL_TABLET | Freq: Every day | ORAL | Status: DC
  Administered 2015-05-23: 5 mg via ORAL
  Filled 2015-05-23 (×3): qty 1

## 2015-05-23 MED ORDER — LISINOPRIL 5 MG PO TABS
5.0000 mg | ORAL_TABLET | Freq: Every day | ORAL | Status: DC
  Administered 2015-05-23 – 2015-05-24 (×2): 5 mg via ORAL
  Filled 2015-05-23 (×2): qty 1

## 2015-05-23 MED ORDER — VERAPAMIL HCL 80 MG PO TABS
80.0000 mg | ORAL_TABLET | Freq: Three times a day (TID) | ORAL | Status: DC
  Administered 2015-05-23: 02:00:00 80 mg via ORAL
  Filled 2015-05-23 (×6): qty 1

## 2015-05-23 NOTE — Progress Notes (Signed)
Subjective: Having SOB with Brilinta during the night.   Objective: Vital signs in last 24 hours: Temp:  [98.1 F (36.7 C)-99.8 F (37.7 C)] 99.8 F (37.7 C) (10/27 0404) Pulse Rate:  [0-101] 101 (10/27 0404) Resp:  [0-33] 20 (10/27 0404) BP: (136-197)/(60-114) 175/79 mmHg (10/27 0610) SpO2:  [0 %-100 %] 97 % (10/27 0404) Weight:  [145 lb 8.1 oz (66 kg)] 145 lb 8.1 oz (66 kg) (10/27 0404) Weight change:    Intake/Output from previous day: 10/26 0701 - 10/27 0700 In: 722.8 [I.V.:722.8] Out: 2350 [Urine:2350] Intake/Output this shift:    PE:  Per MD  General:Pleasant affect, NAD Skin:Warm and dry, brisk capillary refill HEENT:normocephalic, sclera clear, mucus membranes moist Neck:supple, no JVD, no bruits  Heart:S1S2 RRR without murmur, gallup, rub or click Lungs:clear without rales, rhonchi, or wheezes HWE:XHBZ, non tender, + BS, do not palpate liver spleen or masses Ext:no lower ext edema, 2+ pedal pulses, 2+ radial pulses Neuro:alert and oriented, MAE, follows commands, + facial symmetry tele SR + PACs and ST   Lab Results:  Recent Labs  05/22/15 1921 05/23/15 0324  WBC 6.9 7.0  HGB 12.2 11.1*  HCT 36.9 34.6*  PLT 202 148*   BMET  Recent Labs  05/23/15 0324 05/23/15 0712  NA 134* 134*  K 3.0* 2.9*  CL 100* 99*  CO2 28 27  GLUCOSE 130* 96  BUN <5* <5*  CREATININE 0.72 0.74  CALCIUM 7.9* 8.0*    Recent Labs  05/22/15 1205 05/22/15 1921  TROPONINI 18.79* 10.86*    Lab Results  Component Value Date   CHOL 128 05/23/2015   HDL 40* 05/23/2015   LDLCALC 76 05/23/2015   TRIG 62 05/23/2015   CHOLHDL 3.2 05/23/2015   Lab Results  Component Value Date   HGBA1C 5.3 05/22/2015     Lab Results  Component Value Date   TSH 3.792 02/10/2015    Hepatic Function Panel  Recent Labs  05/22/15 0040  PROT 7.9  ALBUMIN 3.3*  AST 26  ALT 12*  ALKPHOS 84  BILITOT 0.4    Recent Labs  05/23/15 0324  CHOL 128   No results  for input(s): PROTIME in the last 72 hours.     Studies/Results: Dg Chest 2 View  05/22/2015  CLINICAL DATA:  59 year old female with history of cough for 1 week. EXAM: CHEST  2 VIEW COMPARISON:  Chest x-ray 02/10/2015. FINDINGS: Opacity throughout the lingula, concerning for pneumonia. Small left pleural effusion. Right lung is clear. No evidence of pulmonary edema. Heart size is normal. Upper mediastinal contours are within normal limits. Atherosclerosis in the thoracic aorta. IMPRESSION: 1. Lingular airspace consolidation concerning for pneumonia. Small left parapneumonic pleural effusion. Followup PA and lateral chest X-ray is recommended in 3-4 weeks following trial of antibiotic therapy to ensure resolution and exclude underlying malignancy. 2. Atherosclerosis. Electronically Signed   By: Trudie Reed M.D.   On: 05/22/2015 00:13   Cardiac cath:   Dist Cx lesion, 30% stenosed.  Post Atrio lesion, 50% stenosed.  Prox RCA lesion, 80% stenosed. Post intervention, there is a 0% residual stenosis.  Prox Cx lesion, 80% stenosed. Post intervention, there is a 0% residual stenosis.  The left ventricular systolic function is normal.  Preserved global LV contractility with ejection fraction of approximately 55% with suggestion of mild posterobasal hypokinesis and mild mid posterolateral hypocontractility.  Two-vessel coronary obstructive disease with an 80% fibrotic proximal left circumflex stenosis, 30% AV  groove stenosis; and 80% eccentric proximal RCA stenosis in a dominant RCA. Normal LAD.  Successful two-vessel percutaneous coronary intervention with PTCA/ stenting of the proximal RCA using a 3.5 x40mm Synergy DES stent postdilated to 3.66 mm, with the 80% stenosis being reduced to 0%; and Angiosculpt scoring balloon of the proximal left circumflex coronary artery with ultimate stenting with a 3.012 mm Synergy DES stent postdilated to 3. 2 mm with the 80% stenosis being reduced to  0%.  Medications: I have reviewed the patient's current medications. Scheduled Meds: . antiseptic oral rinse  7 mL Mouth Rinse q12n4p  . aspirin EC  81 mg Oral Daily  . atorvastatin  80 mg Oral q1800  . chlorhexidine  15 mL Mouth Rinse BID  . DULoxetine  60 mg Oral Daily  . heparin  5,000 Units Subcutaneous 3 times per day  . hydrochlorothiazide  25 mg Oral Daily  . Influenza vac split quadrivalent PF  0.5 mL Intramuscular Tomorrow-1000  . levofloxacin (LEVAQUIN) IV  750 mg Intravenous Q24H  . metoprolol  50 mg Oral BID  . OLANZapine  5 mg Oral QHS  . pneumococcal 23 valent vaccine  0.5 mL Intramuscular Tomorrow-1000  . predniSONE  30 mg Oral Q breakfast  . sodium chloride  3 mL Intravenous Q12H  . ticagrelor  90 mg Oral BID  . verapamil  80 mg Oral TID   Continuous Infusions: . nitroGLYCERIN Stopped (05/23/15 0200)   PRN Meds:.sodium chloride, acetaminophen, guaiFENesin-dextromethorphan, hydrALAZINE, ipratropium-albuterol, nitroGLYCERIN, ondansetron (ZOFRAN) IV, sodium chloride  Assessment/Plan: Principal Problem:   NSTEMI (non-ST elevated myocardial infarction) (HCC)- keep another day post MI  Ef 55% Active Problems:   HYPERTENSION, BENIGN SYSTEMIC continues on home meds and NTG IV   Chest pain   CAD in native artery   S/P angioplasty with stent, 05/22/15 pRCA DES and pLCX DES- continue brilinta and ASA for 1 year, if tolerates, if SOB continues may need to change.   Hypokalemia give 40 meq now and repeat at 1100   LOS: 1 day   Time spent with pt. :15 minutes. [facility] R  Nurse Practitioner Certified Pager [phone] or after 5pm and on weekends call [phone] 05/23/2015, 8:42 AM   See my accompanying note.    Dietrich Pates

## 2015-05-23 NOTE — Progress Notes (Signed)
Subjective: SOB  NO CP   Objective: Filed Vitals:   05/22/15 2200 05/22/15 2300 05/23/15 0404 05/23/15 0610  BP: 190/94 184/92 163/101 175/79  Pulse:   101   Temp:   99.8 F (37.7 C)   TempSrc:   Oral   Resp:   20   Height:      Weight:   145 lb 8.1 oz (66 kg)   SpO2:   97%    Weight change:   Intake/Output Summary (Last 24 hours) at 05/23/15 0846 Last data filed at 05/23/15 0522  Gross per 24 hour  Intake 722.78 ml  Output   2350 ml  Net -1627.22 ml    General: Alert, awake, oriented x3, in no acute distress  Does complain of SOB   Neck:  JVP is normal Heart: Regular rate and rhythm, without murmurs, rubs, gallops.  Lungs: Clear to auscultation.  No rales or wheezes. Exemities:  No edema.   Neuro: Grossly intact, nonfocal.   Lab Results: Results for orders placed or performed during the hospital encounter of 05/21/15 (from the past 24 hour(s))  Protime-INR     Status: Abnormal   Collection Time: 05/22/15  9:50 AM  Result Value Ref Range   Prothrombin Time 15.8 (H) 11.6 - 15.2 seconds   INR 1.24 0.00 - 1.49  Magnesium     Status: None   Collection Time: 05/22/15  9:50 AM  Result Value Ref Range   Magnesium 2.0 1.7 - 2.4 mg/dL  Hemoglobin L7L     Status: None   Collection Time: 05/22/15  9:50 AM  Result Value Ref Range   Hgb A1c MFr Bld 5.3 4.8 - 5.6 %   Mean Plasma Glucose 105 mg/dL  Troponin I     Status: Abnormal   Collection Time: 05/22/15  9:50 AM  Result Value Ref Range   Troponin I 15.06 (HH) <0.031 ng/mL  Heparin level (unfractionated)     Status: None   Collection Time: 05/22/15 11:05 AM  Result Value Ref Range   Heparin Unfractionated 0.38 0.30 - 0.70 IU/mL  Troponin I (q 6hr x 3)     Status: Abnormal   Collection Time: 05/22/15 12:05 PM  Result Value Ref Range   Troponin I 18.79 (HH) <0.031 ng/mL  Basic metabolic panel     Status: Abnormal   Collection Time: 05/22/15 12:05 PM  Result Value Ref Range   Sodium 141 135 - 145 mmol/L   Potassium 3.9 3.5 - 5.1 mmol/L   Chloride 104 101 - 111 mmol/L   CO2 24 22 - 32 mmol/L   Glucose, Bld 88 65 - 99 mg/dL   BUN 6 6 - 20 mg/dL   Creatinine, Ser 8.92 0.44 - 1.00 mg/dL   Calcium 8.6 (L) 8.9 - 10.3 mg/dL   GFR calc non Af Amer >60 >60 mL/min   GFR calc Af Amer >60 >60 mL/min   Anion gap 13 5 - 15  Lipid panel     Status: None   Collection Time: 05/22/15 12:05 PM  Result Value Ref Range   Cholesterol 140 0 - 200 mg/dL   Triglycerides 65 <119 mg/dL   HDL 51 >41 mg/dL   Total CHOL/HDL Ratio 2.7 RATIO   VLDL 13 0 - 40 mg/dL   LDL Cholesterol 76 0 - 99 mg/dL  CBC     Status: Abnormal   Collection Time: 05/22/15 12:05 PM  Result Value Ref Range   WBC 6.2 4.0 - 10.5 K/uL  RBC 3.73 (L) 3.87 - 5.11 MIL/uL   Hemoglobin 11.8 (L) 12.0 - 15.0 g/dL   HCT 01.0 27.2 - 53.6 %   MCV 97.6 78.0 - 100.0 fL   MCH 31.6 26.0 - 34.0 pg   MCHC 32.4 30.0 - 36.0 g/dL   RDW 64.4 (H) 03.4 - 74.2 %   Platelets 134 (L) 150 - 400 K/uL  Troponin I (q 6hr x 3)     Status: Abnormal   Collection Time: 05/22/15  7:21 PM  Result Value Ref Range   Troponin I 10.86 (HH) <0.031 ng/mL  CBC     Status: Abnormal   Collection Time: 05/22/15  7:21 PM  Result Value Ref Range   WBC 6.9 4.0 - 10.5 K/uL   RBC 3.81 (L) 3.87 - 5.11 MIL/uL   Hemoglobin 12.2 12.0 - 15.0 g/dL   HCT 59.5 63.8 - 75.6 %   MCV 96.9 78.0 - 100.0 fL   MCH 32.0 26.0 - 34.0 pg   MCHC 33.1 30.0 - 36.0 g/dL   RDW 43.3 (H) 29.5 - 18.8 %   Platelets 202 150 - 400 K/uL  Creatinine, serum     Status: None   Collection Time: 05/22/15  7:21 PM  Result Value Ref Range   Creatinine, Ser 0.74 0.44 - 1.00 mg/dL   GFR calc non Af Amer >60 >60 mL/min   GFR calc Af Amer >60 >60 mL/min  CBC     Status: Abnormal   Collection Time: 05/23/15  3:24 AM  Result Value Ref Range   WBC 7.0 4.0 - 10.5 K/uL   RBC 3.53 (L) 3.87 - 5.11 MIL/uL   Hemoglobin 11.1 (L) 12.0 - 15.0 g/dL   HCT 41.6 (L) 60.6 - 30.1 %   MCV 98.0 78.0 - 100.0 fL   MCH 31.4 26.0  - 34.0 pg   MCHC 32.1 30.0 - 36.0 g/dL   RDW 60.1 (H) 09.3 - 23.5 %   Platelets 148 (L) 150 - 400 K/uL  Lipid panel     Status: Abnormal   Collection Time: 05/23/15  3:24 AM  Result Value Ref Range   Cholesterol 128 0 - 200 mg/dL   Triglycerides 62 <573 mg/dL   HDL 40 (L) >22 mg/dL   Total CHOL/HDL Ratio 3.2 RATIO   VLDL 12 0 - 40 mg/dL   LDL Cholesterol 76 0 - 99 mg/dL  Basic metabolic panel     Status: Abnormal   Collection Time: 05/23/15  3:24 AM  Result Value Ref Range   Sodium 134 (L) 135 - 145 mmol/L   Potassium 3.0 (L) 3.5 - 5.1 mmol/L   Chloride 100 (L) 101 - 111 mmol/L   CO2 28 22 - 32 mmol/L   Glucose, Bld 130 (H) 65 - 99 mg/dL   BUN <5 (L) 6 - 20 mg/dL   Creatinine, Ser 0.25 0.44 - 1.00 mg/dL   Calcium 7.9 (L) 8.9 - 10.3 mg/dL   GFR calc non Af Amer >60 >60 mL/min   GFR calc Af Amer >60 >60 mL/min   Anion gap 6 5 - 15  Basic metabolic panel     Status: Abnormal   Collection Time: 05/23/15  7:12 AM  Result Value Ref Range   Sodium 134 (L) 135 - 145 mmol/L   Potassium 2.9 (L) 3.5 - 5.1 mmol/L   Chloride 99 (L) 101 - 111 mmol/L   CO2 27 22 - 32 mmol/L   Glucose, Bld 96 65 - 99 mg/dL  BUN <5 (L) 6 - 20 mg/dL   Creatinine, Ser 1.24 0.44 - 1.00 mg/dL   Calcium 8.0 (L) 8.9 - 10.3 mg/dL   GFR calc non Af Amer >60 >60 mL/min   GFR calc Af Amer >60 >60 mL/min   Anion gap 8 5 - 15    Studies/Results: No results found.  Medications:Reviewed  @PROBHOSP @  1  NSTEMI   Pt with severe 2 V CAD  Underewent PTCA/DES to Prox RCA and Prox LCx  LVEF normal   Pt on Brilinta now  Complaining of SOB  I would switch to Effient Stop verapamil  Start metoprolol  Follow BP Keep on ASA enteric coated.  RXn was stomach upset in past.  2  HTN  BP is not under control  Nor is HR>  WOuld use b blocker  D/C Verapamil  Add ACE Switch HCTZ to Maxzide  REplete K  3 Lipids  LDL is good  With CAD and NSTEMI use lipitor 40  4  RA  Medicine to follow  5.  Pulm  On Levaquin  Medcine  following.    LOS: 1 day   05/23/2015, 8:46 AM

## 2015-05-23 NOTE — Progress Notes (Signed)
Patient feels she is not tolerating the Brilinta well she becomes very short of breath, oxygen stable. Increased anxiety and heart rate.

## 2015-05-23 NOTE — Progress Notes (Signed)
Subjective: Cardiology service has requested to take patient onto their service to which the [facility] Medicine Teaching Service agrees. FMTS will continue to follow patient's Community Acquired Pneumonia.  Pneumonia - CXR (2V) 10/26 viewed.  Consolidation of LLL infiltrate with accompanying pleural effusion.  - Patient with hisotry of bilateral exudative pleural effusion in July of this year that was presumed to be due to Rheumatoid Arthritis.  - Day number one post-NSTEMI - Day number 2 of possibly 5 days of Levaquin for pneumonia - new dyspnea that started after starting ticagrelor for DES stents x2 - Afebrile, not hypoxic. No increase in RR        Objective: Vital signs in last 24 hours: Temp:  [98.1 F (36.7 C)-99.8 F (37.7 C)] 98.9 F (37.2 C) (10/27 0820) Pulse Rate:  [0-111] 111 (10/27 0820) Resp:  [0-33] 20 (10/27 0820) BP: (136-197)/(60-114) 175/99 mmHg (10/27 0820) SpO2:  [0 %-100 %] 98 % (10/27 0820) Weight:  [145 lb 8.1 oz (66 kg)] 145 lb 8.1 oz (66 kg) (10/27 0404) Weight change:     Intake/Output from previous day: 10/26 0701 - 10/27 0700 In: 722.8 [I.V.:722.8] Out: 2350 [Urine:2350] Intake/Output this shift:   VS reviewed GEN: Alert, Cooperative in mild distress HEENT: PERRL COR: RRR, No M/G/R, No JVD, Normal PMI size and location LUNGS: BCTA, No Acc mm use, speaking in full sentences ABDOMEN: (+)BS, soft, NT, ND, No HSM, No palpable masses EXT: No peripheral leg edema.  SKIN: No lesion nor rashes of face/trunk/extremities Psych: sad affect/ thought concrete/speech clear/language goal directed.     Lab Results:  Recent Labs  05/22/15 1921 05/23/15 0324  WBC 6.9 7.0  HGB 12.2 11.1*  HCT 36.9 34.6*  PLT 202 148*   BMET  Recent Labs  05/23/15 0324 05/23/15 0712  NA 134* 134*  K 3.0* 2.9*  CL 100* 99*  CO2 28 27  GLUCOSE 130* 96  BUN <5* <5*  CREATININE 0.72 0.74  CALCIUM 7.9* 8.0*    Studies/Results: Dg Chest 2  View  05/22/2015  Lingular airspace consolidation concerning for pneumonia. Followup PA and lateral chest X-ray is recommended in 3-4 weeks following trial of antibiotic therapy to ensure resolution and exclude underlying malignancy. 2. Atherosclerosis. Electronically Signed   By: Trudie Reed M.D.   On: 05/22/2015 00:13    Medications: I have reviewed the patient's current medications.  Assessment/Plan: Pneumonia with associated Pleural effusion - New problem - further workup with left lateral decubitus film tomorrow. - Patient with history of bilateral exudative pleural effusions in July attributed to Rheumatoid Arthritis.  - Switched to oral Levoquin. End date 05/27/15.     LOS: 1 day   Jocelyn Shelton D 05/23/2015, 12:47 PM

## 2015-05-23 NOTE — Progress Notes (Signed)
Initial Nutrition Assessment  INTERVENTION:   Ensure Enlive po BID, each supplement provides 350 kcal and 20 grams of protein  NUTRITION DIAGNOSIS:   Inadequate oral intake related to poor appetite as evidenced by meal completion < 50%.  GOAL:   Patient will meet greater than or equal to 90% of their needs  MONITOR:   PO intake, Supplement acceptance, Weight trends  REASON FOR ASSESSMENT:   Malnutrition Screening Tool    ASSESSMENT:   Pt with known CAD admitted with several weeks of chest pain and NSTEMI now s/p stent. Pt also being treated for PNA.   Labs reviewed: sodium low (134), potassium low (2.9) Pt reports several weeks of not feeling well, tried. She has been eating less and sleeping more. She never eats breakfast. Has not been eating lunch, does eat supper. Lives in a hotel with her daughter. They use crock pot and hot plate for cooking. They usually cook for supper (had spaghetti the other night) but she did not eat much. She wakes around 11 am and goes to bed about 9 pm.  She does not report any recent weight loss, usual weight 145 lb.  Pt receptive to ensure. Discussed use of supplements at home until appetite better.  Meal Completion: <50% Nutrition-Focused physical exam completed. Findings are no fat depletion, no muscle depletion, and no edema.    Diet Order:  Diet Heart Room service appropriate?: Yes; Fluid consistency:: Thin  Skin:  Reviewed, no issues  Last BM:  unknown  Height:   Ht Readings from Last 1 Encounters:  05/22/15 5\' 6"  (1.676 m)   Weight:   Wt Readings from Last 1 Encounters:  05/23/15 145 lb 8.1 oz (66 kg)   Ideal Body Weight:  59 kg  BMI:  Body mass index is 23.5 kg/(m^2).  Estimated Nutritional Needs:   Kcal:  1500-1700  Protein:  70-80 grams  Fluid:  > 1.5 L/day  EDUCATION NEEDS:   No education needs identified at this time  05/25/15 RD, LDN, CNSC [phone] Pager [phone] After Hours Pager

## 2015-05-23 NOTE — Progress Notes (Signed)
CARDIAC REHAB PHASE I   PRE:  Rate/Rhythm: 130 ST c/PVCs  BP:  Sitting: 175/99        SaO2:97 RA  MODE:  Ambulation: 240 ft   POST:  Rate/Rhythm: 132 ST c/ PVCs  BP:  Sitting: 167/89         SaO2: 98 RA  Pt ambulated 240 ft on RA, IV, assist x1, steady gait, tolerated fair.  Pt c/o of DOE and ongoing shortness of breath, denies cp, dizziness, standing rest x2.  BP and HR elevated. Began MI/stent education. Reviewed risk factors, tobacco cessation, MI book, anti-platelet therapy, stent card, activity restrictions, ntg, exercise, and phase 2 cardiac rehab. Pt verbalized understanding. Pt falling asleep, states she's "just so tired." Assisted to bed, left heart healthy diet handout for pt to review. Pt agrees to phase 2 cardiac rehab referral, will send to [facility]. Pt in bed, call bell within reach.    Will follow-up tomorrow.  [phone]  Joylene Grapes, RN, BSN 05/23/2015 10:08 AM

## 2015-05-23 NOTE — Progress Notes (Signed)
MD notified twice about elevated SBP 180-190'S while on NTG gtt. Home meds not ordered plan is to start verapamil at home dose tonight and continue to monitor.

## 2015-05-24 ENCOUNTER — Telehealth: Payer: Self-pay | Admitting: Internal Medicine

## 2015-05-24 ENCOUNTER — Inpatient Hospital Stay (HOSPITAL_COMMUNITY): Payer: Medicaid Other

## 2015-05-24 LAB — CBC
HCT: 36.4 % (ref 36.0–46.0)
Hemoglobin: 11.9 g/dL — ABNORMAL LOW (ref 12.0–15.0)
MCH: 31.5 pg (ref 26.0–34.0)
MCHC: 32.7 g/dL (ref 30.0–36.0)
MCV: 96.3 fL (ref 78.0–100.0)
Platelets: 167 10*3/uL (ref 150–400)
RBC: 3.78 MIL/uL — ABNORMAL LOW (ref 3.87–5.11)
RDW: 16.2 % — AB (ref 11.5–15.5)
WBC: 5.9 10*3/uL (ref 4.0–10.5)

## 2015-05-24 LAB — BASIC METABOLIC PANEL
ANION GAP: 9 (ref 5–15)
BUN: 8 mg/dL (ref 6–20)
CALCIUM: 9 mg/dL (ref 8.9–10.3)
CHLORIDE: 96 mmol/L — AB (ref 101–111)
CO2: 27 mmol/L (ref 22–32)
CREATININE: 0.96 mg/dL (ref 0.44–1.00)
GFR calc non Af Amer: >60 mL/min (ref >60)
Glucose, Bld: 110 mg/dL — ABNORMAL HIGH (ref 65–99)
Potassium: 3.4 mmol/L — ABNORMAL LOW (ref 3.5–5.1)
SODIUM: 132 mmol/L — AB (ref 135–145)

## 2015-05-24 MED ORDER — PRASUGREL HCL 10 MG PO TABS: 10.0000 mg | ORAL_TABLET | Freq: Every day | ORAL | Status: DC

## 2015-05-24 MED ORDER — ASPIRIN 81 MG PO TBEC: 81.0000 mg | DELAYED_RELEASE_TABLET | Freq: Every day | ORAL | Status: DC

## 2015-05-24 MED ORDER — LEVOFLOXACIN 750 MG PO TABS: 750.0000 mg | ORAL_TABLET | Freq: Every day | ORAL | Status: DC

## 2015-05-24 MED ORDER — TRIAMTERENE-HCTZ 75-50 MG PO TABS: 0.5000 | ORAL_TABLET | Freq: Every day | ORAL | Status: DC

## 2015-05-24 MED ORDER — LISINOPRIL 10 MG PO TABS: 10.0000 mg | ORAL_TABLET | Freq: Every day | ORAL | Status: DC

## 2015-05-24 MED ORDER — METOPROLOL TARTRATE 50 MG PO TABS: 50.0000 mg | ORAL_TABLET | Freq: Two times a day (BID) | ORAL | Status: DC

## 2015-05-24 MED ORDER — NITROGLYCERIN 0.4 MG SL SUBL: 0.4000 mg | SUBLINGUAL_TABLET | SUBLINGUAL | Status: DC | PRN

## 2015-05-24 MED ORDER — GUAIFENESIN-DM 100-10 MG/5ML PO SYRP: 15.0000 mL | ORAL_SOLUTION | ORAL | Status: DC | PRN

## 2015-05-24 MED ORDER — POTASSIUM CHLORIDE CRYS ER 20 MEQ PO TBCR
20.0000 meq | EXTENDED_RELEASE_TABLET | Freq: Once | ORAL | Status: AC
  Administered 2015-05-24: 20 meq via ORAL
  Filled 2015-05-24: qty 1

## 2015-05-24 MED ORDER — ATORVASTATIN CALCIUM 40 MG PO TABS: 40.0000 mg | ORAL_TABLET | Freq: Every day | ORAL | Status: DC

## 2015-05-24 NOTE — Care Management Note (Signed)
Case Management Note  Patient Details  Name: Jocelyn Shelton MRN: [id number] Date of Birth: June 20, 1956  Subjective/Objective:  Pt admitted for Nstemi. Plan for d/c home on Effient.                   Action/Plan: Pt uses Psychologist, forensic on [employer] and medication is available. CM did provide pt with the 30 day free Effient Card and medication is a preferred Medication for Medicaid. Co pay should be no more than $3.00. No further needs from CM at this time.    Expected Discharge Date:                  Expected Discharge Plan:  Home/Self Care  In-House Referral:     Discharge planning Services  CM Consult, Medication Assistance  Post Acute Care Choice:  NA Choice offered to:  NA  DME Arranged:  N/A DME Agency:  NA  HH Arranged:  NA HH Agency:  NA  Status of Service:  Completed, signed off  Medicare Important Message Given:    Date Medicare IM Given:    Medicare IM give by:    Date Additional Medicare IM Given:    Additional Medicare Important Message give by:     If discussed at Long Length of Stay Meetings, dates discussed:    Additional Comments:  Gala Lewandowsky, RN 05/24/2015, 10:23 AM

## 2015-05-24 NOTE — Progress Notes (Signed)
Subjective: MIld dull pressure on chest  Not like 2 days ago  No SOB   Objective: Filed Vitals:   05/23/15 1725 05/23/15 2048 05/23/15 2352 05/24/15 0533  BP: 146/90 158/88 157/87 162/91  Pulse: 106 98 82 87  Temp: 98.4 F (36.9 C) 98.9 F (37.2 C) 98.7 F (37.1 C) 98.4 F (36.9 C)  TempSrc: Oral Oral Oral Oral  Resp: 20  16   Height:      Weight:    149 lb 1.6 oz (67.631 kg)  SpO2: 97% 99% 100% 98%   Weight change: 4 lb 0.6 oz (1.831 kg)  Intake/Output Summary (Last 24 hours) at 05/24/15 1117 Last data filed at 05/23/15 2200  Gross per 24 hour  Intake    480 ml  Output      0 ml  Net    480 ml    General: Alert, awake, oriented x3, in no acute distress Neck:  JVP is normal Heart: Regular rate and rhythm, without murmurs, rubs, gallops.  Lungs: Clear to auscultation.  No rales or wheezes. Exemities:  No edema.   Neuro: Grossly intact, nonfocal.   Lab Results: Results for orders placed or performed during the hospital encounter of 05/21/15 (from the past 24 hour(s))  CBC     Status: Abnormal   Collection Time: 05/24/15  3:20 AM  Result Value Ref Range   WBC 5.9 4.0 - 10.5 K/uL   RBC 3.78 (L) 3.87 - 5.11 MIL/uL   Hemoglobin 11.9 (L) 12.0 - 15.0 g/dL   HCT 24.4 01.0 - 27.2 %   MCV 96.3 78.0 - 100.0 fL   MCH 31.5 26.0 - 34.0 pg   MCHC 32.7 30.0 - 36.0 g/dL   RDW 53.6 (H) 64.4 - 03.4 %   Platelets 167 150 - 400 K/uL  Basic metabolic panel     Status: Abnormal   Collection Time: 05/24/15  8:50 AM  Result Value Ref Range   Sodium 132 (L) 135 - 145 mmol/L   Potassium 3.4 (L) 3.5 - 5.1 mmol/L   Chloride 96 (L) 101 - 111 mmol/L   CO2 27 22 - 32 mmol/L   Glucose, Bld 110 (H) 65 - 99 mg/dL   BUN 8 6 - 20 mg/dL   Creatinine, Ser 7.42 0.44 - 1.00 mg/dL   Calcium 9.0 8.9 - 59.5 mg/dL   GFR calc non Af Amer >60 >60 mL/min   GFR calc Af Amer >60 >60 mL/min   Anion gap 9 5 - 15    Studies/Results: Dg Chest Left Decubitus  05/24/2015  CLINICAL DATA:  Recurrent  left pleural effusion EXAM: CHEST - LEFT DECUBITUS COMPARISON:  PA and lateral chest x-ray of May 21, 2015 FINDINGS: The lungs are well-expanded. There is no significant pleural effusion visible on the left. Hazy density at the left lung base laterally may reflect atelectasis. No right-sided effusion is observed either. The heart and pulmonary vascularity are unremarkable. IMPRESSION: There is no significant loculated or free flowing pleural effusion observed. There is mild left lower lobe atelectasis or infiltrate. Electronically Signed   By: David  [place] M.D.   On: 05/24/2015 07:38    Medications:]Reviewed  @PROBHOSP @  1  CAD  S/pPTCA/stent.  X 2.  Doing well  Discomfort today appears noncardiac  Slept funny Plan to ambulate  If does OK  Then D/C     2.  HTN  Improving  Meds have been switched.  Increase lisinopril to 10    3  HL  Lipitor  F/U in clnic   10 days    LOS: 2 days   Dietrich Pates 05/24/2015, 11:17 AM

## 2015-05-24 NOTE — Progress Notes (Signed)
UR Completed Saintclair Schroader Graves-Bigelow, RN,BSN [phone]  

## 2015-05-24 NOTE — Progress Notes (Signed)
CARDIAC REHAB PHASE I   PRE:  Rate/Rhythm: 88 SR  BP:  Sitting: 137/91        SaO2: 98 RA  MODE:  Ambulation: 350 ft   POST:  Rate/Rhythm: 95 SR  BP:  Sitting: 134/86         SaO2: 98 RA  Pt sitting on edge of bed, states she feels better today, HR improved. Pt ambulated 350 ft on RA, slow, fairly steady gait, tolerated well. Pt denies CP, dizziness, DOE, did have to return suddenly to room, pt stated she felt hot and like she needed to have a bowel movement (pt states this happens to her sometimes). Pt assisted to bathroom, did have bowel movement. BP and HR improved today (max HR 99 with ambulation). Slight limp noted during ambulation, pt states this is normal and she normally just hold on to things, pt declined use of walker, states she might like a cane. CM notified, cane delivered to bedside. Reviewed heart healthy diet, pt verbalized understanding. Pt states she has no questions regarding yesterday or today's education, states she feels ready to go home. Pt to bed after walk, call bell within reach.    [phone]  Joylene Grapes, RN, BSN 05/24/2015 12:48 PM

## 2015-05-24 NOTE — Progress Notes (Signed)
Discharge instructions received and discussed with pt. Prescriptions given for Effient and ASA. Others scripts sent electronically to [facility] pharmacy. Post cath discharge instructions discussed and hand out given. Verbalized understanding. Rt groin site with moderate bruising and small hematoma. Bandaid changed. +PP. Site care discussed with pt. Discharged to home with family via w/c to front entrance with family.

## 2015-05-24 NOTE — Discharge Summary (Signed)
CARDIOLOGY DISCHARGE SUMMARY   Patient ID: Jocelyn Shelton MRN: [id number] DOB/AGE: [birth year] 59 y.o.  Admit date: 05/21/2015 Discharge date: 05/24/2015  PCP: Saralyn Pilar, DO Primary Cardiologist: Dr Tenny Craw  Primary Discharge Diagnosis:   NSTEMI (non-ST elevated myocardial infarction) [facility]) Secondary Discharge Diagnosis:    HYPERTENSION, BENIGN SYSTEMIC   Pleural effusion   Chest pain   CAD in native artery   S/P angioplasty with stent, 05/22/15 pRCA DES and pLCX DES   CAP (community acquired pneumonia)   Adverse drug reaction  Consults: Family Medicine  Procedures:   Left Heart Cath and Coronary Angiography, Coronary Stent Intervention  Hospital Course: Jocelyn Shelton is a 59 y.o. female with a history of scleroderma, RA, OA, depression, HTN, GERD.   She came to the hospital with a 2 week history of epigastric pain, cough, fever, N&V.  She was diagnosed with NSTEMI, hypertensive urgency, PNA and hyponatremia. She was admitted for further evaluation and treatment.   Her peak troponin was 18.79. She was treated medically at first, but cardiac cath was indicated. She was taken to the lab on 10/26. Cardiac cath results are below. She had DES with PCI to the CFX and RCA, tolerating the procedure well. Her EF is preserved. She became SOB on Brilinta and was changed to Effient. She was started on a BB and high-dose statin.   Her BP medications were adjusted as her BP was very high on admission. The changes are reflected in the d/c list below.  IM managed her PNA and other medical issues. She improved from a respiratory standpoint and will complete a course of Levaquin as an outpatient.  Her BNP was mildly elevated and she has a history of diastolic dysfunction by recent echo. No CHF on CXR and her respiratory status improved with rx of her PNA.  She ambulated with cardiac rehab and was educated on MI restrictions, heart-healthy lifestyle changes and exercise guidelines.    On 10/28, she was seen by Dr Tenny Craw as well as FM service and all data were reviewed. Pharmacy stated there was no contraindication to sulfasalazine and her cardiac meds. The IM service stated no steroid taper was needed, and she could resume her previous dosing. No further inpatient workup is indicated and she is considered stable for discharge, to follow up as an outpatient.   Labs:   Lab Results  Component Value Date   WBC 5.9 05/24/2015   HGB 11.9* 05/24/2015   HCT 36.4 05/24/2015   MCV 96.3 05/24/2015   PLT 167 05/24/2015    Recent Labs Lab 05/22/15 0040  05/24/15 0850  NA 132*  < > 132*  K 3.5  < > 3.4*  CL 95*  < > 96*  CO2 29  < > 27  BUN 7  < > 8  CREATININE 1.00  < > 0.96  CALCIUM 8.6*  < > 9.0  PROT 7.9  --   --   BILITOT 0.4  --   --   ALKPHOS 84  --   --   ALT 12*  --   --   AST 26  --   --   GLUCOSE 138*  < > 110*  < > = values in this interval not displayed.  Recent Labs  05/22/15 0950 05/22/15 1205 05/22/15 1921  TROPONINI 15.06* 18.79* 10.86*   Lipid Panel     Component Value Date/Time   CHOL 128 05/23/2015 0324   TRIG 62 05/23/2015 0324  HDL 40* 05/23/2015 0324   CHOLHDL 3.2 05/23/2015 0324   VLDL 12 05/23/2015 0324   LDLCALC 76 05/23/2015 0324    B NATRIURETIC PEPTIDE  Date/Time Value Ref Range Status  05/23/2015 10:00 AM 363.8* 0.0 - 100.0 pg/mL Final  05/22/2015 12:40 AM 175.4* 0.0 - 100.0 pg/mL Final    Recent Labs  05/22/15 0950  INR 1.24      Radiology: Dg Chest 2 View 05/22/2015  CLINICAL DATA:  59 year old female with history of cough for 1 week. EXAM: CHEST  2 VIEW COMPARISON:  Chest x-ray 02/10/2015. FINDINGS: Opacity throughout the lingula, concerning for pneumonia. Small left pleural effusion. Right lung is clear. No evidence of pulmonary edema. Heart size is normal. Upper mediastinal contours are within normal limits. Atherosclerosis in the thoracic aorta. IMPRESSION: 1. Lingular airspace consolidation concerning for  pneumonia. Small left parapneumonic pleural effusion. Followup PA and lateral chest X-ray is recommended in 3-4 weeks following trial of antibiotic therapy to ensure resolution and exclude underlying malignancy. 2. Atherosclerosis. Electronically Signed   By: Trudie Reed M.D.   On: 05/22/2015 00:13   Dg Chest Left Decubitus 05/24/2015  CLINICAL DATA:  Recurrent left pleural effusion EXAM: CHEST - LEFT DECUBITUS COMPARISON:  PA and lateral chest x-ray of May 21, 2015 FINDINGS: The lungs are well-expanded. There is no significant pleural effusion visible on the left. Hazy density at the left lung base laterally may reflect atelectasis. No right-sided effusion is observed either. The heart and pulmonary vascularity are unremarkable. IMPRESSION: There is no significant loculated or free flowing pleural effusion observed. There is mild left lower lobe atelectasis or infiltrate. Electronically Signed   By: David  Swaziland M.D.   On: 05/24/2015 07:38    Cardiac Cath: 05/22/2015  Dist Cx lesion, 30% stenosed.  Post Atrio lesion, 50% stenosed.  Prox RCA lesion, 80% stenosed. Post intervention, there is a 0% residual stenosis.  Prox Cx lesion, 80% stenosed. Post intervention, there is a 0% residual stenosis.  The left ventricular systolic function is normal. Preserved global LV contractility with ejection fraction of approximately 55% with suggestion of mild posterobasal hypokinesis and mild mid posterolateral hypocontractility.   Two-vessel coronary obstructive disease with an 80% fibrotic proximal left circumflex stenosis, 30% AV groove stenosis; and 80% eccentric proximal RCA stenosis in a dominant RCA. Normal LAD.   Successful two-vessel percutaneous coronary intervention with PTCA/ stenting of the proximal RCA using a 3.5 x27mm Synergy DES stent postdilated to 3.66 mm, with the 80% stenosis being reduced to 0%; and Angiosculpt scoring balloon of the proximal left circumflex coronary artery  with ultimate stenting with a 3.012 mm Synergy DES stent postdilated to 3. 2 mm with the 80% stenosis being reduced to 0%.  Diagnostic Diagram           Post-Intervention Diagram           EKG: 05/24/2015 SR, T wave inversion III  FOLLOW UP PLANS AND APPOINTMENTS Allergies  Allergen Reactions  . Ticagrelor Shortness Of Breath  . Aspirin Other (See Comments)    Stomach cramps  . Cymbalta [Duloxetine Hcl] Nausea Only    No appetite, stomach pain  . Tomato Rash     Medication List    STOP taking these medications        cetirizine 10 MG tablet  Commonly known as:  ZYRTEC     DULoxetine 60 MG capsule  Commonly known as:  CYMBALTA     hydrochlorothiazide 25 MG tablet  Commonly known as:  HYDRODIURIL     ibuprofen 200 MG tablet  Commonly known as:  ADVIL,MOTRIN     meloxicam 15 MG tablet  Commonly known as:  MOBIC     verapamil 80 MG tablet  Commonly known as:  CALAN      TAKE these medications        acetaminophen 500 MG tablet  Commonly known as:  TYLENOL  Take 1,000 mg by mouth daily as needed for pain.     aspirin 81 MG EC tablet  Take 1 tablet (81 mg total) by mouth daily.     atorvastatin 40 MG tablet  Commonly known as:  LIPITOR  Take 1 tablet (40 mg total) by mouth daily at 6 PM.     ATROVENT HFA 17 MCG/ACT inhaler  Generic drug:  ipratropium  INHALE TWO PUFFS BY MOUTH ONCE DAILY AS NEEDED FOR WHEEZING     bismuth subsalicylate 262 MG/15ML suspension  Commonly known as:  PEPTO BISMOL  Take 30 mLs by mouth every 6 (six) hours as needed for indigestion.     CALCIUM PO  Take 1 tablet by mouth daily.     fluticasone 50 MCG/ACT nasal spray  Commonly known as:  FLONASE  Place 2 sprays into both nostrils daily as needed for rhinitis.     folic acid 1 MG tablet  Commonly known as:  FOLVITE  TAKE 1 TABLET (1 MG TOTAL) BY MOUTH DAILY.     guaiFENesin-dextromethorphan 100-10 MG/5ML syrup  Commonly known as:  ROBITUSSIN DM  Take 15 mLs by  mouth every 4 (four) hours as needed for cough.     levofloxacin 750 MG tablet  Commonly known as:  LEVAQUIN  Take 1 tablet (750 mg total) by mouth daily.     lisinopril 10 MG tablet  Commonly known as:  PRINIVIL,ZESTRIL  Take 1 tablet (10 mg total) by mouth daily.  Start taking on:  05/25/2015     methotrexate 2.5 MG tablet  Commonly known as:  RHEUMATREX  Take 5 tablets (12.5 mg total) by mouth 2 (two) times a week. Take 5 tablets every Mon and Tues. Caution:Chemotherapy. Protect from light.     metoprolol 50 MG tablet  Commonly known as:  LOPRESSOR  Take 1 tablet (50 mg total) by mouth 2 (two) times daily.     multivitamin with minerals Tabs tablet  Take 1 tablet by mouth daily.     nitroGLYCERIN 0.4 MG SL tablet  Commonly known as:  NITROSTAT  Place 1 tablet (0.4 mg total) under the tongue every 5 (five) minutes as needed for chest pain.     OLANZapine 5 MG tablet  Commonly known as:  ZYPREXA  Take 1 tablet (5 mg total) by mouth at bedtime.     omeprazole 40 MG capsule  Commonly known as:  PRILOSEC  TAKE ONE CAPSULE BY MOUTH ONCE DAILY     prasugrel 10 MG Tabs tablet  Commonly known as:  EFFIENT  Take 1 tablet (10 mg total) by mouth daily.     predniSONE 5 MG tablet  Commonly known as:  DELTASONE  Take 1 tablet (5 mg total) by mouth daily.     sulfaSALAzine 500 MG tablet  Commonly known as:  AZULFIDINE  Take 1,000 mg by mouth 2 (two) times daily.     triamterene-hydrochlorothiazide 75-50 MG tablet  Commonly known as:  MAXZIDE  Take 0.5 tablets by mouth daily.        Discharge Instructions    Amb  Referral to Cardiac Rehabilitation    Complete by:  As directed   Diagnosis:   Myocardial Infarction PCI       Diet - low sodium heart healthy    Complete by:  As directed      Increase activity slowly    Complete by:  As directed           Follow-up Information    Follow up with [facility], NP On 05/31/2015.   Specialties:  Cardiology, Radiology    Why:  See Provider at 8:00 am, please arrive a few minutes early for paperwork.   Contact information:   [address] [place] [address] [phone]       Follow up with Dietrich Pates, MD.   Specialty:  Cardiology   Why:  As Directed   Contact information:   [address] [place] [address] [phone]       BRING ALL MEDICATIONS WITH YOU TO FOLLOW UP APPOINTMENTS  Time spent with patient to include physician time: 48 min Signed: Theodore Demark, PA-C 05/24/2015, 3:16 PM Co-Sign MD

## 2015-05-24 NOTE — Progress Notes (Signed)
Family Medicine Teaching Service Daily Progress Note Intern Pager: [phone]  Patient name: Jocelyn Shelton Medical record number: [id number] Date of birth: 08-06-1955 Age: 59 y.o. Gender: female  Primary Care Provider: Saralyn Pilar, DO Code Status: full  Assessment and Plan: Jocelyn Shelton is a 59 y.o. female presenting via EMS with chest pan . PMH is significant for HTN, GERD, RA, and scleroderma.   Pneumonia: patient with subjective fever, chill, cough and crackles on lung exam (left>right). CXR with in immunosuppressed patient is also concerning for pneumonia. CXR with lingular airspace consolidation and small left parapneumonic pleural effusion. Patient with RA and scleroderma. She is immunosuppressed with methotrexate and prednisone. Her symptoms could also be due to rheumatoid pneumonitis/ILD vs. MTX-induced lung dz/interstitial pneumonitis. She has history of dry cough and dyspnea from pleural effusion. Left decubitus CXR with no significant loculated or free flowing pleural effusion but mild left lower lobe atelectasis or infiltrate. On days 3 of Levaquin 750 mg. Patient reports significant improvement. Denies shortness of breath or chest pain. Lung exams are unremarkable. She can continue Levaquin 750 mg daily for two more days to complete the course for pneumonia. Family medicine is signing off at this time.   NSTEMI/Hypertensive emergency/HFpEF: cardiology managing  Subjective:  Reports having a good night. Denies shortness of breath or chest pain. She still have dry cough.   Objective: Temp:  [98 F (36.7 C)-98.9 F (37.2 C)] 98.4 F (36.9 C) (10/28 0533) Pulse Rate:  [82-106] 87 (10/28 0533) Resp:  [16-20] 16 (10/27 2352) BP: (136-197)/(80-129) 162/91 mmHg (10/28 0533) SpO2:  [97 %-100 %] 98 % (10/28 0533) Weight:  [149 lb 1.6 oz (67.631 kg)] 149 lb 1.6 oz (67.631 kg) (10/28 0533) Physical Exam: Gen: well appearing, pleasant, sitting on the edge of the  bed Oropharynx: clear, moist CV: RRR. S1 & S2 audible Resp: no apparent WOB, CTAB. Abd: +BS. Soft, NDNT, no rebound or guarding.  Ext: No edema or gross deformities. Skin tightness Neuro: Alert and oriented, No gross focal deficits  Laboratory:  Recent Labs Lab 05/22/15 1921 05/23/15 0324 05/24/15 0320  WBC 6.9 7.0 5.9  HGB 12.2 11.1* 11.9*  HCT 36.9 34.6* 36.4  PLT 202 148* 167    Recent Labs Lab 05/22/15 0040 05/22/15 1205 05/22/15 1921 05/23/15 0324 05/23/15 0712  NA 132* 141  --  134* 134*  K 3.5 3.9  --  3.0* 2.9*  CL 95* 104  --  100* 99*  CO2 29 24  --  28 27  BUN 7 6  --  <5* <5*  CREATININE 1.00 0.72 0.74 0.72 0.74  CALCIUM 8.6* 8.6*  --  7.9* 8.0*  PROT 7.9  --   --   --   --   BILITOT 0.4  --   --   --   --   ALKPHOS 84  --   --   --   --   ALT 12*  --   --   --   --   AST 26  --   --   --   --   GLUCOSE 138* 88  --  130* 96    Imaging/Diagnostic Tests: Dg Chest Left Decubitus  05/24/2015  CLINICAL DATA:  Recurrent left pleural effusion EXAM: CHEST - LEFT DECUBITUS COMPARISON:  PA and lateral chest x-ray of May 21, 2015 FINDINGS: The lungs are well-expanded. There is no significant pleural effusion visible on the left. Hazy density at the left lung base laterally may  reflect atelectasis. No right-sided effusion is observed either. The heart and pulmonary vascularity are unremarkable. IMPRESSION: There is no significant loculated or free flowing pleural effusion observed. There is mild left lower lobe atelectasis or infiltrate. Electronically Signed   By: David  Swaziland M.D.   On: 05/24/2015 07:38    Almon Hercules, MD 05/24/2015, 9:20 AM PGY-1, [facility] Family Medicine FPTS Intern pager: [phone], text pages welcome

## 2015-05-24 NOTE — Telephone Encounter (Signed)
TCM per [facility]  11/4 @ 8 am w/ Vernona Rieger

## 2015-05-24 NOTE — Progress Notes (Signed)
[facility] PCP Visit - Progress Note  I have seen the patient and discussed current hospitalization. Jocelyn Shelton is in good spirits today and reports that she is feeling better. Briefly reviewed her treatment for the NSTEMI and her Pneumonia. No further questions.   I agree with the current hospital plan of both the primary Cardiology and the [facility] Medicine Teaching Service and want to thank them for their continued excellent care and efforts in caring for my patient. I will anticipate to follow-up with the patient in the clinic after discharge from hospital.  Saralyn Pilar, DO [facility], PGY-3

## 2015-05-24 NOTE — Discharge Instructions (Signed)
PLEASE REMEMBER TO BRING ALL OF YOUR MEDICATIONS TO EACH OF YOUR FOLLOW-UP OFFICE VISITS. ° °PLEASE ATTEND ALL SCHEDULED FOLLOW-UP APPOINTMENTS.  ° °Activity: Increase activity slowly as tolerated. You may shower, but no soaking baths (or swimming) for 1 week. No driving for 1 week. No lifting over 5 lbs for 2 weeks. No sexual activity for 1 week.  ° °You May Return to Work: in 3 weeks (if applicable) ° °Wound Care: You may wash cath site gently with soap and water. Keep cath site clean and dry. If you notice pain, swelling, bleeding or pus at your cath site, please call [phone]. ° ° ° °Cardiac Cath Site Care °Refer to this sheet in the next few weeks. These instructions provide you with information on caring for yourself after your procedure. Your caregiver may also give you more specific instructions. Your treatment has been planned according to current medical practices, but problems sometimes occur. Call your caregiver if you have any problems or questions after your procedure. °HOME CARE INSTRUCTIONS °· You may shower 24 hours after the procedure. Remove the bandage (dressing) and gently wash the site with plain soap and water. Gently pat the site dry.  °· Do not apply powder or lotion to the site.  °· Do not sit in a bathtub, swimming pool, or whirlpool for 5 to 7 days.  °· No bending, squatting, or lifting anything over 10 pounds (4.5 kg) as directed by your caregiver.  °· Inspect the site at least twice daily.  °· Do not drive home if you are discharged the same day of the procedure. Have someone else drive you.  °· You may drive 24 hours after the procedure unless otherwise instructed by your caregiver.  °What to expect: °· Any bruising will usually fade within 1 to 2 weeks.  °· Blood that collects in the tissue (hematoma) may be painful to the touch. It should usually decrease in size and tenderness within 1 to 2 weeks.  °SEEK IMMEDIATE MEDICAL CARE IF: °· You have unusual pain at the site or down the  affected limb.  °· You have redness, warmth, swelling, or pain at the site.  °· You have drainage (other than a small amount of blood on the dressing).  °· You have chills.  °· You have a fever or persistent symptoms for more than 72 hours.  °· You have a fever and your symptoms suddenly get worse.  °· Your leg becomes pale, cool, tingly, or numb.  °· You have heavy bleeding from the site. Hold pressure on the site.  °Document Released: 08/15/2010 Document Revised: 07/02/2011 Document Reviewed:  ° °

## 2015-05-27 NOTE — Telephone Encounter (Signed)
lmtcb

## 2015-05-28 NOTE — Telephone Encounter (Signed)
Patient contacted regarding discharge from [facility] on 05/24/15.  Patient understands to follow up with provider Nada Boozer on 05/31/15 at 8:00 at [facility]. Patient understands discharge instructions? yes Patient understands medications and regiment? yes Patient understands to bring all medications to this visit? yes  States she is doing good.  No CP or SOB. No redness or swelling at (R) fem cath site.  Is increasing activity daily.  Verbalizes understanding of medications and was able to get all of the new medications on day of discharge.Verbalizes understanding to bring all of her medications with her to appointment on 11/4.

## 2015-05-30 ENCOUNTER — Other Ambulatory Visit: Payer: Self-pay | Admitting: Family Medicine

## 2015-05-30 DIAGNOSIS — M069 Rheumatoid arthritis, unspecified: Secondary | ICD-10-CM

## 2015-05-31 ENCOUNTER — Encounter: Payer: Self-pay | Admitting: Cardiology

## 2015-05-31 ENCOUNTER — Ambulatory Visit (INDEPENDENT_AMBULATORY_CARE_PROVIDER_SITE_OTHER): Payer: Medicaid Other | Admitting: Cardiology

## 2015-05-31 ENCOUNTER — Ambulatory Visit
Admission: RE | Admit: 2015-05-31 | Discharge: 2015-05-31 | Disposition: A | Discharge status: Home / Self Care | Payer: Medicaid Other | Source: Ambulatory Visit | Attending: Cardiology | Admitting: Cardiology

## 2015-05-31 VITALS — BP 120/60 | HR 89 | Ht 66.0 in | Wt 140.8 lb

## 2015-05-31 DIAGNOSIS — D649 Anemia, unspecified: Secondary | ICD-10-CM

## 2015-05-31 DIAGNOSIS — R05 Cough: Secondary | ICD-10-CM

## 2015-05-31 DIAGNOSIS — IMO0002 Reserved for concepts with insufficient information to code with codable children: Secondary | ICD-10-CM

## 2015-05-31 DIAGNOSIS — E785 Hyperlipidemia, unspecified: Secondary | ICD-10-CM

## 2015-05-31 DIAGNOSIS — I2510 Atherosclerotic heart disease of native coronary artery without angina pectoris: Secondary | ICD-10-CM

## 2015-05-31 DIAGNOSIS — I252 Old myocardial infarction: Secondary | ICD-10-CM

## 2015-05-31 DIAGNOSIS — Z720 Tobacco use: Secondary | ICD-10-CM

## 2015-05-31 DIAGNOSIS — I10 Essential (primary) hypertension: Secondary | ICD-10-CM | POA: Diagnosis not present

## 2015-05-31 DIAGNOSIS — R053 Chronic cough: Secondary | ICD-10-CM

## 2015-05-31 LAB — CBC
HCT: 39.3 % (ref 36.0–46.0)
HEMOGLOBIN: 13.1 g/dL (ref 12.0–15.0)
MCH: 30.6 pg (ref 26.0–34.0)
MCHC: 33.3 g/dL (ref 30.0–36.0)
MCV: 91.8 fL (ref 78.0–100.0)
MPV: 9.9 fL (ref 8.6–12.4)
PLATELETS: 261 10*3/uL (ref 150–400)
RBC: 4.28 MIL/uL (ref 3.87–5.11)
RDW: 15.9 % — ABNORMAL HIGH (ref 11.5–15.5)
WBC: 6.4 10*3/uL (ref 4.0–10.5)

## 2015-05-31 LAB — BASIC METABOLIC PANEL
BUN: 11 mg/dL (ref 7–25)
CHLORIDE: 91 mmol/L — AB (ref 98–110)
CO2: 26 mmol/L (ref 20–31)
Calcium: 9.5 mg/dL (ref 8.6–10.4)
Creat: 1.1 mg/dL — ABNORMAL HIGH (ref 0.50–0.99)
Glucose, Bld: 113 mg/dL — ABNORMAL HIGH (ref 65–99)
Potassium: 3.8 mmol/L (ref 3.5–5.3)
SODIUM: 126 mmol/L — AB (ref 135–146)

## 2015-05-31 MED ORDER — BENZONATATE 100 MG PO CAPS: ORAL_CAPSULE | ORAL | Status: DC

## 2015-05-31 MED ORDER — VALSARTAN 80 MG PO TABS: 80.0000 mg | ORAL_TABLET | Freq: Every day | ORAL | Status: DC

## 2015-05-31 NOTE — Patient Instructions (Addendum)
Medication Instructions:   STOP TAKING LISINOPRIL   START TAKING DIOVAN 80 MG ONCE A DAY   START TAKING TESSALON 100 MG ONE TO TWO TABLETS EVERY 8 HOURS   If you need a refill on your cardiac medications before your next appointment, please call your pharmacy.  Labwork: CBC BMET TODAY    Testing/Procedures: A chest x-ray takes a picture of the organs and structures inside the chest, including the heart, lungs, and blood vessels. This test can show several things, including, whether the heart is enlarges; whether fluid is building up in the lungs; and whether pacemaker / defibrillator leads are still in place.  [facility]  [address]   Follow-Up:  IN 2 MONTHS WITH DR ROSS  IN 1 TO 2 WEEKS WITH AVAILABLE APP    Any Other Special Instructions Will Be Listed Below (If Applicable).   RECCOMMENDED TO CUT BACK ON TOBBACO

## 2015-05-31 NOTE — Progress Notes (Signed)
Cardiology Office Note   Date:  05/31/2015   ID:  ILLA Shelton, DOB 02-09-1956, MRN [id number]  PCP:  Saralyn Pilar, DO  Cardiologist:  Dr. Tenny Craw   Chief Complaint  Patient presents with  . Hospitalization Follow-up    + cough      History of Present Illness: Jocelyn Shelton is a 59 y.o. female who presents for hospital follow up.  Pt admitted 05/21/15 to 05/24/15 (no follow up phone call within 48 hours) for NSTEMI, hypertensive emergency, PNA and hyponatremia.  pk troponin was 18.79.  Cardiac cath was done and she underwent PCI of pRCA and pLCX with DES of RCA and angiosculpting of the LCX and then synergy DES.    PNA treated with Levaquin she has completed.  She continues with effient and asa.  She has no chest pain and no SOB. She did have significant SOB with Brilinta.   She does have cough that has not improved and is increasing.  She had cough with ACE in past.  She has been placed on lisinopril.    Has decreased tobacco to 1/2 ppd, we discussed importance of stopping tobacco and decreasing cigarettes to best 5 in a day then decreasing, not smoking in the house.    Past Medical History  Diagnosis Date  . Hypertension   . Reflux   . Scleroderma (HCC)   . Rheumatoid arthritis(714.0)   . S/P angioplasty with stent, 05/22/15 pRCA DES and pLCX DES 05/23/2015  . CAD in native artery 05/23/2015    Past Surgical History  Procedure Laterality Date  . Appendectomy  90's  . Cardiac catheterization N/A 05/22/2015    Procedure: Left Heart Cath and Coronary Angiography;  Surgeon: Lennette Bihari, MD;  Location: [facility] INVASIVE CV LAB;  Service: Cardiovascular;  Laterality: N/A;  . Cardiac catheterization N/A 05/22/2015    Procedure: Coronary Stent Intervention;  Surgeon: Lennette Bihari, MD;  Location: [facility] INVASIVE CV LAB;  Service: Cardiovascular;  Laterality: N/A;     Current Outpatient Prescriptions  Medication Sig Dispense Refill  . acetaminophen (TYLENOL) 500 MG tablet  Take 1,000 mg by mouth daily as needed for pain.     Marland Kitchen aspirin EC 81 MG EC tablet Take 1 tablet (81 mg total) by mouth daily.    Marland Kitchen atorvastatin (LIPITOR) 40 MG tablet Take 1 tablet (40 mg total) by mouth daily at 6 PM. 30 tablet 11  . ATROVENT HFA 17 MCG/ACT inhaler INHALE TWO PUFFS BY MOUTH ONCE DAILY AS NEEDED FOR WHEEZING 13 g 2  . bismuth subsalicylate (PEPTO BISMOL) 262 MG/15ML suspension Take 30 mLs by mouth every 6 (six) hours as needed for indigestion.    Marland Kitchen CALCIUM PO Take 1 tablet by mouth daily.    . fluticasone (FLONASE) 50 MCG/ACT nasal spray Place 2 sprays into both nostrils daily as needed for rhinitis. 16 g 1  . folic acid (FOLVITE) 1 MG tablet TAKE 1 TABLET (1 MG TOTAL) BY MOUTH DAILY. 30 tablet 6  . guaiFENesin-dextromethorphan (ROBITUSSIN DM) 100-10 MG/5ML syrup Take 15 mLs by mouth every 4 (four) hours as needed for cough. 118 mL 0  . methotrexate (RHEUMATREX) 2.5 MG tablet Take 5 tablets (12.5 mg total) by mouth 2 (two) times a week. Take 5 tablets every Mon and Tues. Caution:Chemotherapy. Protect from light. 40 tablet 0  . metoprolol (LOPRESSOR) 50 MG tablet Take 1 tablet (50 mg total) by mouth 2 (two) times daily. 180 tablet 2  . Multiple Vitamin (MULTIVITAMIN  WITH MINERALS) TABS tablet Take 1 tablet by mouth daily.    . nitroGLYCERIN (NITROSTAT) 0.4 MG SL tablet Place 1 tablet (0.4 mg total) under the tongue every 5 (five) minutes as needed for chest pain. 25 tablet 12  . OLANZapine (ZYPREXA) 5 MG tablet Take 1 tablet (5 mg total) by mouth at bedtime. 30 tablet 11  . omeprazole (PRILOSEC) 40 MG capsule TAKE ONE CAPSULE BY MOUTH ONCE DAILY 90 capsule 2  . prasugrel (EFFIENT) 10 MG TABS tablet Take 1 tablet (10 mg total) by mouth daily. 30 tablet 11  . predniSONE (DELTASONE) 5 MG tablet TAKE ONE TABLET BY MOUTH ONCE DAILY 30 tablet 2  . sulfaSALAzine (AZULFIDINE) 500 MG tablet Take 1,000 mg by mouth 2 (two) times daily.    Marland Kitchen triamterene-hydrochlorothiazide (MAXZIDE) 75-50 MG  tablet Take 0.5 tablets by mouth daily. 30 tablet 3  . benzonatate (TESSALON PERLES) 100 MG capsule TAKE ONE TO TWO TABLET EVERY 8 HOURS AS NEEDED FOR COUGH 30 capsule 0  . valsartan (DIOVAN) 80 MG tablet Take 1 tablet (80 mg total) by mouth daily. 30 tablet 6   No current facility-administered medications for this visit.    Allergies:   Ticagrelor; Aspirin; Cymbalta; Lisinopril; and Tomato    Social History:  The patient  reports that she has been smoking Cigarettes.  She has a 20 pack-year smoking history. She does not have any smokeless tobacco history on file. She reports that she drinks about 2.0 oz of alcohol per week. She reports that she does not use illicit drugs.   Family History:  The patient's family history includes Diabetes in her daughter and sister; Heart attack in her mother; Hypertension in her father; Lung cancer (age of onset: 16) in her brother.    ROS:  General:no colds or fevers, no weight changes Skin:no rashes or ulcers HEENT:no blurred vision, no congestion CV:see HPI PUL:see HPI GI:+ diarrhea in hospital has since discharge. No constipation or melena, no indigestion GU:no hematuria, no dysuria MS: joint pain has improved since dischaarge, no claudication Neuro:no syncope, no lightheadedness Endo:no diabetes, no thyroid disease  Wt Readings from Last 3 Encounters:  05/31/15 140 lb 12.8 oz (63.866 kg)  05/24/15 149 lb 1.6 oz (67.631 kg)  02/25/15 149 lb (67.586 kg)     PHYSICAL EXAM: VS:  BP 120/60 mmHg  Pulse 89  Ht 5\' 6"  (1.676 m)  Wt 140 lb 12.8 oz (63.866 kg)  BMI 22.74 kg/m2  SpO2 98% , BMI Body mass index is 22.74 kg/(m^2). General:Pleasant affect, NAD Skin:Warm and dry, brisk capillary refill HEENT:normocephalic, sclera clear, mucus membranes moist Neck:supple, no JVD, no bruits  Heart:S1S2 RRR without murmur, gallup, rub or click Lungs:clear without rales, rhonchi, or wheezes , non tender, + BS, do not palpate liver spleen or  masses Ext:no lower ext edema, 2+ pedal pulses, 2+ radial pulses Neuro:alert and oriented X3, MAE, follows commands, + facial symmetry    EKG:  EKG is ordered today. The ekg ordered today demonstrates SR no acute changes.    Recent Labs: 02/10/2015: TSH 3.792 05/22/2015: ALT 12*; Magnesium 2.0 05/23/2015: B Natriuretic Peptide 363.8* 05/31/2015: BUN 11; Creat 1.10*; Hemoglobin 13.1; Platelets 261; Potassium 3.8; Sodium 126*    Lipid Panel    Component Value Date/Time   CHOL 128 05/23/2015 0324   TRIG 62 05/23/2015 0324   HDL 40* 05/23/2015 0324   CHOLHDL 3.2 05/23/2015 0324   VLDL 12 05/23/2015 0324   LDLCALC 76 05/23/2015 0324  Other studies Reviewed: Additional studies/ records that were reviewed today include: . ECHO: Left ventricle: The cavity size was normal. There was mild focal basal hypertrophy of the septum. Systolic function was normal. The estimated ejection fraction was in the range of 60% to 65%. Wall motion was normal; there were no regional wall motion abnormalities. Doppler parameters are consistent with abnormal left ventricular relaxation (grade 1 diastolic dysfunction). There was no evidence of elevated ventricular filling pressure by Doppler parameters. - Aortic valve: Trileaflet; normal thickness leaflets. There was no regurgitation. - Aortic root: The aortic root was normal in size. - Mitral valve: Structurally normal valve. There was mild regurgitation. - Left atrium: The atrium was normal in size. - Right ventricle: Systolic function was normal. - Tricuspid valve: There was mild-moderate regurgitation. - Pulmonic valve: There was no regurgitation. - Pulmonary arteries: Systolic pressure was mildly increased. PA peak pressure: 43 mm Hg (S). - Inferior vena cava: The vessel was normal in size. - Pericardium, extracardiac: There was no pericardial effusion.  Impressions:  - Normal biventricular size and  function. Abnormal relaxation with normal filling pressures. Mild mitral and mild to moderate tricuspid regurgitation. 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 Dilated coronary sinus.   CATH:  Dist Cx lesion, 30% stenosed.  Post Atrio lesion, 50% stenosed.  Prox RCA lesion, 80% stenosed. Post intervention, there is a 0% residual stenosis.  Prox Cx lesion, 80% stenosed. Post intervention, there is a 0% residual stenosis.  The left ventricular systolic function is normal.  Preserved global LV contractility with ejection fraction of approximately 55% with suggestion of mild posterobasal hypokinesis and mild mid posterolateral hypocontractility.  Two-vessel coronary obstructive disease with an 80% fibrotic proximal left circumflex stenosis, 30% AV groove stenosis; and 80% eccentric proximal RCA stenosis in a dominant RCA. Normal LAD.  Successful two-vessel percutaneous coronary intervention with PTCA/ stenting of the proximal RCA using a 3.5 x79mm Synergy DES stent postdilated to 3.66 mm, with the 80% stenosis being reduced to 0%; and Angiosculpt scoring balloon of the proximal left circumflex coronary artery with ultimate stenting with a 3.012 mm Synergy DES stent postdilated to 3. 2 mm with the 80% stenosis being reduced to 0%.  ASSESSMENT AND PLAN:  1.  Cough increased since discharge== hx of lisinopril cough in the past.  + pl effusions=check 2 view CXR and stop lisinopril begin diovan 80 mg daily cough may last up to 4 weeks, tessalon pearles 100 mg 1-2 every 8 hours prn  Follow up with APP in 1-2 weeks  Follow up with Dr. Tenny Craw in 2 months.      2. CAD with NSTEMI and stents continue effient and ASA. Continue BB.   3. Hx MI- no further chest pain  4. HTN controlled  5. PNA recheck CXR.   6 RA on steroids   7. Hyperlipidemia continue lipitor.   8. Cough, stop lisinopril, add tessalon pearles   9. Stop tobacco   Current medicines are reviewed with the  patient today.  The patient Has no concerns regarding medicines.  The following changes have been made:  See above Labs/ tests ordered today include:see above  Disposition:   FU:  see above  Signed, Leone Brand, NP  05/31/2015 11:11 PM    [facility] [address] [place], [place], [place]  [address]/ [address] [place], [place] Phone: [phone]; Fax: [phone]  [phone]

## 2015-06-06 ENCOUNTER — Telehealth: Payer: Self-pay

## 2015-06-06 DIAGNOSIS — E871 Hypo-osmolality and hyponatremia: Secondary | ICD-10-CM

## 2015-06-06 NOTE — Telephone Encounter (Signed)
Called patient with lab results. Per Nada Boozer NP, Recheck BMP 06/05/15 with hyponatremia. And CKD 3. Since date is past will schedule for tomorrow.

## 2015-06-07 ENCOUNTER — Other Ambulatory Visit (INDEPENDENT_AMBULATORY_CARE_PROVIDER_SITE_OTHER): Payer: Medicaid Other | Admitting: *Deleted

## 2015-06-07 DIAGNOSIS — E871 Hypo-osmolality and hyponatremia: Secondary | ICD-10-CM | POA: Diagnosis not present

## 2015-06-07 LAB — BASIC METABOLIC PANEL
BUN: 16 mg/dL (ref 7–25)
CHLORIDE: 92 mmol/L — AB (ref 98–110)
CO2: 26 mmol/L (ref 20–31)
CREATININE: 1.08 mg/dL — AB (ref 0.50–0.99)
Calcium: 9.3 mg/dL (ref 8.6–10.4)
Glucose, Bld: 126 mg/dL — ABNORMAL HIGH (ref 65–99)
POTASSIUM: 4.2 mmol/L (ref 3.5–5.3)
SODIUM: 129 mmol/L — AB (ref 135–146)

## 2015-06-07 NOTE — Addendum Note (Signed)
Addended by: Tonita Phoenix on: 06/07/2015 09:11 AM   Modules accepted: Orders

## 2015-06-10 ENCOUNTER — Telehealth: Payer: Self-pay | Admitting: Internal Medicine

## 2015-06-10 NOTE — Telephone Encounter (Signed)
Follow Up  Pt returned call for results (labs)

## 2015-06-10 NOTE — Telephone Encounter (Signed)
Informed patient

## 2015-06-13 ENCOUNTER — Telehealth: Payer: Self-pay | Admitting: *Deleted

## 2015-06-13 NOTE — Telephone Encounter (Signed)
-----   Message from Leone Brand, NP sent at 05/31/2015  5:38 PM EDT ----- CXR has improved, may be the lisinopril that we stopped causing cough.

## 2015-06-14 ENCOUNTER — Other Ambulatory Visit: Payer: Self-pay | Admitting: Cardiology

## 2015-06-14 ENCOUNTER — Ambulatory Visit (INDEPENDENT_AMBULATORY_CARE_PROVIDER_SITE_OTHER): Payer: Medicaid Other | Admitting: Cardiology

## 2015-06-14 ENCOUNTER — Encounter: Payer: Self-pay | Admitting: Cardiology

## 2015-06-14 VITALS — BP 118/64 | HR 68 | Ht 66.0 in | Wt 140.6 lb

## 2015-06-14 DIAGNOSIS — I10 Essential (primary) hypertension: Secondary | ICD-10-CM

## 2015-06-14 DIAGNOSIS — R05 Cough: Secondary | ICD-10-CM | POA: Diagnosis not present

## 2015-06-14 DIAGNOSIS — I2510 Atherosclerotic heart disease of native coronary artery without angina pectoris: Secondary | ICD-10-CM

## 2015-06-14 DIAGNOSIS — R053 Chronic cough: Secondary | ICD-10-CM

## 2015-06-14 DIAGNOSIS — Z720 Tobacco use: Secondary | ICD-10-CM

## 2015-06-14 DIAGNOSIS — E785 Hyperlipidemia, unspecified: Secondary | ICD-10-CM

## 2015-06-14 DIAGNOSIS — I252 Old myocardial infarction: Secondary | ICD-10-CM

## 2015-06-14 DIAGNOSIS — IMO0002 Reserved for concepts with insufficient information to code with codable children: Secondary | ICD-10-CM

## 2015-06-14 DIAGNOSIS — E871 Hypo-osmolality and hyponatremia: Secondary | ICD-10-CM

## 2015-06-14 LAB — BASIC METABOLIC PANEL
BUN: 12 mg/dL (ref 7–25)
CHLORIDE: 92 mmol/L — AB (ref 98–110)
CO2: 26 mmol/L (ref 20–31)
Calcium: 9 mg/dL (ref 8.6–10.4)
Creat: 0.93 mg/dL (ref 0.50–0.99)
GLUCOSE: 93 mg/dL (ref 65–99)
Potassium: 3.9 mmol/L (ref 3.5–5.3)
SODIUM: 129 mmol/L — AB (ref 135–146)

## 2015-06-14 MED ORDER — BENZONATATE 100 MG PO CAPS: ORAL_CAPSULE | ORAL | Status: DC

## 2015-06-14 NOTE — Progress Notes (Signed)
Cardiology Office Note   Date:  06/14/2015   ID:  Jocelyn Shelton, DOB [birth year], MRN [id number]  PCP:  Saralyn Pilar, DO  Cardiologist:  Dr. Tenny Craw    Chief Complaint  Patient presents with  . Follow-up  . Coronary Artery Disease      History of Present Illness: Jocelyn Shelton is a 59 y.o. female who presents for follow up after office visit 2 weeks ago post hospital with SOB and cough secondary to ACE which I stopped and pt much improved.  Recent NSTEMI, hypertensive emergency, PNA and hyponatremia. pk troponin was 18.79. Cardiac cath was done and she underwent PCI of pRCA and pLCX with DES of RCA and angiosculpting of the LCX and then synergy DES.   Today she is much improved with no chest pain and no SOB.  She continues to decrease smoking.  She cannot attend cardiac rehab currently due to transportation. If transportation improves she will call and we can refer to rehab.  She is taking her ASA and effient without problems.  She take ensure to help with her poor appetite.  Her insurance will not pay for diovan and BP is well controlled so will leave off for now.     Past Medical History  Diagnosis Date  . Hypertension   . Reflux   . Scleroderma (HCC)   . Rheumatoid arthritis(714.0)   . S/P angioplasty with stent, 05/22/15 pRCA DES and pLCX DES 05/23/2015  . CAD in native artery 05/23/2015    Past Surgical History  Procedure Laterality Date  . Appendectomy  90's  . Cardiac catheterization N/A 05/22/2015    Procedure: Left Heart Cath and Coronary Angiography;  Surgeon: Lennette Bihari, MD;  Location: [facility] INVASIVE CV LAB;  Service: Cardiovascular;  Laterality: N/A;  . Cardiac catheterization N/A 05/22/2015    Procedure: Coronary Stent Intervention;  Surgeon: Lennette Bihari, MD;  Location: [facility] INVASIVE CV LAB;  Service: Cardiovascular;  Laterality: N/A;     Current Outpatient Prescriptions  Medication Sig Dispense Refill  . acetaminophen (TYLENOL) 500 MG  tablet Take 1,000 mg by mouth daily as needed for pain.     Marland Kitchen aspirin EC 81 MG EC tablet Take 1 tablet (81 mg total) by mouth daily.    Marland Kitchen atorvastatin (LIPITOR) 40 MG tablet Take 1 tablet (40 mg total) by mouth daily at 6 PM. 30 tablet 11  . ATROVENT HFA 17 MCG/ACT inhaler INHALE TWO PUFFS BY MOUTH ONCE DAILY AS NEEDED FOR WHEEZING 13 g 2  . benzonatate (TESSALON PERLES) 100 MG capsule TAKE ONE TO TWO TABLET EVERY 8 HOURS AS NEEDED FOR COUGH 30 capsule 0  . bismuth subsalicylate (PEPTO BISMOL) 262 MG/15ML suspension Take 30 mLs by mouth every 6 (six) hours as needed for indigestion.    Marland Kitchen CALCIUM PO Take 1 tablet by mouth daily.    . fluticasone (FLONASE) 50 MCG/ACT nasal spray Place 2 sprays into both nostrils daily as needed for rhinitis. 16 g 1  . folic acid (FOLVITE) 1 MG tablet TAKE 1 TABLET (1 MG TOTAL) BY MOUTH DAILY. 30 tablet 6  . guaiFENesin-dextromethorphan (ROBITUSSIN DM) 100-10 MG/5ML syrup Take 15 mLs by mouth every 4 (four) hours as needed for cough. 118 mL 0  . methotrexate (RHEUMATREX) 2.5 MG tablet Take 5 tablets (12.5 mg total) by mouth 2 (two) times a week. Take 5 tablets every Mon and Tues. Caution:Chemotherapy. Protect from light. 40 tablet 0  . metoprolol (LOPRESSOR) 50 MG tablet Take  1 tablet (50 mg total) by mouth 2 (two) times daily. 180 tablet 2  . Multiple Vitamin (MULTIVITAMIN WITH MINERALS) TABS tablet Take 1 tablet by mouth daily.    . nitroGLYCERIN (NITROSTAT) 0.4 MG SL tablet Place 0.4 mg under the tongue every 5 (five) minutes as needed for chest pain (x 3pills daily).    Marland Kitchen OLANZapine (ZYPREXA) 5 MG tablet Take 1 tablet (5 mg total) by mouth at bedtime. 30 tablet 11  . omeprazole (PRILOSEC) 40 MG capsule TAKE ONE CAPSULE BY MOUTH ONCE DAILY 90 capsule 2  . prasugrel (EFFIENT) 10 MG TABS tablet Take 1 tablet (10 mg total) by mouth daily. 30 tablet 11  . predniSONE (DELTASONE) 5 MG tablet TAKE ONE TABLET BY MOUTH ONCE DAILY 30 tablet 2  . sulfaSALAzine (AZULFIDINE)  500 MG tablet Take 1,000 mg by mouth 2 (two) times daily.    Marland Kitchen triamterene-hydrochlorothiazide (MAXZIDE) 75-50 MG tablet Take 0.5 tablets by mouth daily. 30 tablet 3  . valsartan (DIOVAN) 80 MG tablet Take 1 tablet (80 mg total) by mouth daily. 30 tablet 6   No current facility-administered medications for this visit.    Allergies:   Ticagrelor; Aspirin; Cymbalta; Lisinopril; and Tomato    Social History:  The patient  reports that she has been smoking Cigarettes.  She has a 20 pack-year smoking history. She does not have any smokeless tobacco history on file. She reports that she drinks about 2.0 oz of alcohol per week. She reports that she does not use illicit drugs.   Family History:  The patient's family history includes Diabetes in her daughter and sister; Heart attack in her mother; Hypertension in her father; Lung cancer (age of onset: 27) in her brother.    ROS:  General:no colds or fevers, no weight changes Skin:no rashes or ulcers HEENT:no blurred vision, no congestion CV:see HPI PUL:see HPI GI:no diarrhea constipation or melena, no indigestion GU:no hematuria, no dysuria MS:no joint pain, no claudication Neuro:no syncope, no lightheadedness Endo:no diabetes, no thyroid disease  Wt Readings from Last 3 Encounters:  06/14/15 140 lb 9.6 oz (63.776 kg)  05/31/15 140 lb 12.8 oz (63.866 kg)  05/24/15 149 lb 1.6 oz (67.631 kg)     PHYSICAL EXAM: VS:  BP 118/64 mmHg  Pulse 68  Ht 5\' 6"  (1.676 m)  Wt 140 lb 9.6 oz (63.776 kg)  BMI 22.70 kg/m2  SpO2 98% , BMI Body mass index is 22.7 kg/(m^2). General:Pleasant affect, NAD Skin:Warm and dry, brisk capillary refill HEENT:normocephalic, sclera clear, mucus membranes moist Neck:supple, no JVD, no bruits  Heart:S1S2 RRR without murmur, gallup, rub or click Lungs:clear without rales, rhonchi, or wheezes , non tender, + BS, do not palpate liver spleen or masses Ext:no lower ext edema, 2+ pedal pulses, 2+ radial  pulses Neuro:alert and oriented, MAE, follows commands, + facial symmetry    EKG:  EKG is NOT ordered today.    Recent Labs: 02/10/2015: TSH 3.792 05/22/2015: ALT 12*; Magnesium 2.0 05/23/2015: B Natriuretic Peptide 363.8* 05/31/2015: Hemoglobin 13.1; Platelets 261 06/07/2015: BUN 16; Creat 1.08*; Potassium 4.2; Sodium 129*    Lipid Panel    Component Value Date/Time   CHOL 128 05/23/2015 0324   TRIG 62 05/23/2015 0324   HDL 40* 05/23/2015 0324   CHOLHDL 3.2 05/23/2015 0324   VLDL 12 05/23/2015 0324   LDLCALC 76 05/23/2015 0324       Other studies Reviewed: Additional studies/ records that were reviewed today include: previous notes.   ASSESSMENT AND  PLAN:  1. Cough  hx of lisinopril cough in the past. - today much improved off lisinopril - continue tessalon pearles 100 mg 1-2 every 8 hours prn  She could not get diovan and pharmacy did not contact us.  But BP stable so will hold for now.   Follow up with Dr. Tenny Craw appt is made.   2. CAD with NSTEMI and stents continue effient and ASA. Continue BB.   3. Hx MI- no further chest pain  4. HTN controlled  5. PNA improved CXR.on last visit- per radiology will recheck in 4 weeks to insure PNA is resolved. Pl effusions are resolved.    6 RA on steroids   7. Hyperlipidemia continue lipitor. If appetite does not improve may need to decrease dose.  Will check lipids in 4 weeks.  8. Stop tobacco she continues to decrese  Cardiac rehab when pt has transportation.    Current medicines are reviewed with the patient today.  The patient Has no concerns regarding medicines.  The following changes have been made:  See above Labs/ tests ordered today include:see above  Disposition:   FU:  see above  Signed, Leone Brand, NP  06/14/2015 9:26 AM    [facility] [address] [place], [place], [place]  [address]/ [address] [place], [place] Phone: [phone]; Fax: [phone]   [phone]

## 2015-06-14 NOTE — Patient Instructions (Signed)
Medication Instructions:  Your physician has recommended you make the following change in your medication:  1.  STOP the Diovan   Labwork: TODAY:  BMP  4 WEEKS:  LIPID & HEPATIC  07/12/15   Testing/Procedures:   IN 3 WEEKS your physician wants you to have a CHEST XRAY. A chest x-ray takes a picture of the organs and structures inside the chest, including the heart, lungs, and blood vessels. This test can show several things, including, whether the heart is enlarges; whether fluid is building up in the lungs; and whether pacemaker / defibrillator leads are still in place.   Follow-Up: Your physician recommends that you keep your scheduled follow-up appointment with Dr. Tenny Craw in January.  Any Other Special Instructions Will Be Listed Below (If Applicable).  If you need a refill on your cardiac medications before your next appointment, please call your pharmacy.

## 2015-06-17 ENCOUNTER — Telehealth: Payer: Self-pay | Admitting: *Deleted

## 2015-06-17 NOTE — Telephone Encounter (Signed)
Pt has been made aware that her labs were stable.  Pt verbalized understanding.

## 2015-06-17 NOTE — Telephone Encounter (Signed)
-----   Message from Leone Brand, NP sent at 06/14/2015  9:50 PM EST ----- Labs stable.

## 2015-06-24 ENCOUNTER — Other Ambulatory Visit: Payer: Self-pay | Admitting: Physician Assistant

## 2015-07-05 ENCOUNTER — Ambulatory Visit
Admission: RE | Admit: 2015-07-05 | Discharge: 2015-07-05 | Disposition: A | Discharge status: Home / Self Care | Payer: Medicaid Other | Source: Ambulatory Visit | Attending: Cardiology | Admitting: Cardiology

## 2015-07-05 DIAGNOSIS — R05 Cough: Secondary | ICD-10-CM

## 2015-07-05 DIAGNOSIS — R053 Chronic cough: Secondary | ICD-10-CM

## 2015-07-12 ENCOUNTER — Other Ambulatory Visit (INDEPENDENT_AMBULATORY_CARE_PROVIDER_SITE_OTHER): Payer: Medicaid Other | Admitting: *Deleted

## 2015-07-12 DIAGNOSIS — E785 Hyperlipidemia, unspecified: Secondary | ICD-10-CM

## 2015-07-12 LAB — LIPID PANEL
CHOLESTEROL: 98 mg/dL — AB (ref 125–200)
HDL: 52 mg/dL (ref >=46)
LDL CALC: 34 mg/dL (ref <130)
TRIGLYCERIDES: 60 mg/dL (ref <150)
Total CHOL/HDL Ratio: 1.9 Ratio (ref <=5.0)
VLDL: 12 mg/dL (ref <30)

## 2015-07-12 LAB — HEPATIC FUNCTION PANEL
ALBUMIN: 3.4 g/dL — AB (ref 3.6–5.1)
ALT: 16 U/L (ref 6–29)
AST: 21 U/L (ref 10–35)
Alkaline Phosphatase: 96 U/L (ref 33–130)
BILIRUBIN INDIRECT: 0.1 mg/dL — AB (ref 0.2–1.2)
Bilirubin, Direct: 0.1 mg/dL (ref <=0.2)
TOTAL PROTEIN: 6.9 g/dL (ref 6.1–8.1)
Total Bilirubin: 0.2 mg/dL (ref 0.2–1.2)

## 2015-07-17 ENCOUNTER — Telehealth: Payer: Self-pay | Admitting: *Deleted

## 2015-07-17 DIAGNOSIS — R9389 Abnormal findings on diagnostic imaging of other specified body structures: Secondary | ICD-10-CM

## 2015-07-17 NOTE — Telephone Encounter (Signed)
-----   Message from Pricilla Riffle, MD sent at 07/08/2015 12:14 AM EST ----- Please arrange for CT of chest (with contrast) to eval L sided shadowing on CXR ----- Message -----    From: Leone Brand, NP    Sent: 07/07/2015   5:09 PM      To: Pricilla Riffle, MD  i did follow up CXR for PNA clearing and  These results returned.  Should we get CT in 2 months?

## 2015-07-17 NOTE — Telephone Encounter (Signed)
Spoke with patient to inform. Placed order for CT chest with contrast. She is still coughing; already taking tessalon, robitussin and cough drops. She will try her humidfier to see if this helps relieve it. Using atrovent inhaler as needed for wheezing.

## 2015-07-23 ENCOUNTER — Inpatient Hospital Stay: Admission: RE | Admit: 2015-07-23 | Payer: Medicaid Other | Source: Ambulatory Visit

## 2015-07-24 ENCOUNTER — Other Ambulatory Visit: Payer: Self-pay | Admitting: Family Medicine

## 2015-07-24 DIAGNOSIS — J309 Allergic rhinitis, unspecified: Secondary | ICD-10-CM

## 2015-08-01 ENCOUNTER — Telehealth: Payer: Self-pay | Admitting: *Deleted

## 2015-08-01 ENCOUNTER — Ambulatory Visit (INDEPENDENT_AMBULATORY_CARE_PROVIDER_SITE_OTHER)
Admission: RE | Admit: 2015-08-01 | Discharge: 2015-08-01 | Disposition: A | Discharge status: Home / Self Care | Payer: Medicaid Other | Source: Ambulatory Visit | Attending: Internal Medicine | Admitting: Internal Medicine

## 2015-08-01 DIAGNOSIS — R059 Cough, unspecified: Secondary | ICD-10-CM

## 2015-08-01 DIAGNOSIS — R05 Cough: Secondary | ICD-10-CM

## 2015-08-01 DIAGNOSIS — R938 Abnormal findings on diagnostic imaging of other specified body structures: Secondary | ICD-10-CM | POA: Diagnosis not present

## 2015-08-01 MED ORDER — IOHEXOL 300 MG/ML  SOLN
80.0000 mL | Freq: Once | INTRAMUSCULAR | Status: AC | PRN
  Administered 2015-08-01: 80 mL via INTRAVENOUS

## 2015-08-01 NOTE — Telephone Encounter (Signed)
-----   Message from Dietrich Pates V, MD sent at 08/01/2015 11:05 AM EST ----- Pneumonia appears to have resolved CT shows suspicions for interstitial lung dz  Esophagus dilated with fluid Pt with scleroderma I spoke to pt   Biggest complaint is cough I would recomm referral to pulmonary for baseline eval.

## 2015-08-09 ENCOUNTER — Ambulatory Visit: Payer: Medicaid Other | Admitting: Internal Medicine

## 2015-08-29 ENCOUNTER — Ambulatory Visit (INDEPENDENT_AMBULATORY_CARE_PROVIDER_SITE_OTHER): Payer: Medicaid Other | Admitting: Internal Medicine

## 2015-08-29 ENCOUNTER — Encounter: Payer: Self-pay | Admitting: Internal Medicine

## 2015-08-29 VITALS — BP 122/70 | HR 86 | Ht 66.0 in | Wt 151.8 lb

## 2015-08-29 DIAGNOSIS — J449 Chronic obstructive pulmonary disease, unspecified: Secondary | ICD-10-CM | POA: Diagnosis not present

## 2015-08-29 DIAGNOSIS — F17210 Nicotine dependence, cigarettes, uncomplicated: Secondary | ICD-10-CM

## 2015-08-29 DIAGNOSIS — R05 Cough: Secondary | ICD-10-CM

## 2015-08-29 DIAGNOSIS — R058 Other specified cough: Secondary | ICD-10-CM

## 2015-08-29 MED ORDER — FAMOTIDINE 20 MG PO TABS: ORAL_TABLET | ORAL | Status: DC

## 2015-08-29 MED ORDER — IPRATROPIUM BROMIDE HFA 17 MCG/ACT IN AERS: 2.0000 | INHALATION_SPRAY | Freq: Four times a day (QID) | RESPIRATORY_TRACT | Status: DC

## 2015-08-29 NOTE — Patient Instructions (Signed)
Change atrovent to 2 pffs 4x daily  Work on inhaler technique:  relax and gently blow all the way out then take a nice smooth deep breath back in, triggering the inhaler at same time you start breathing in.  Hold for up to 5 seconds if you can. Blow out thru nose. Rinse and gargle with water when done  Change prilosec to Take 30-60 min before first meal of the day and add pepcid ac 20 mg at bedtime  GERD (REFLUX)  is an extremely common cause of respiratory symptoms just like yours , many times with no obvious heartburn at all.    It can be treated with medication, but also with lifestyle changes including elevation of the head of your bed (ideally with 6 inch  bed blocks),  Smoking cessation, avoidance of late meals, excessive alcohol, and avoid fatty foods, chocolate, peppermint, colas, red wine, and acidic juices such as orange juice.  NO MINT OR MENTHOL PRODUCTS SO NO COUGH DROPS  USE SUGARLESS CANDY INSTEAD (Jolley ranchers or Stover's or Life Savers) or even ice chips will also do - the key is to swallow to prevent all throat clearing. NO OIL BASED VITAMINS - use powdered substitutes.   The key is to stop smoking completely before smoking completely stops you!  Please schedule a follow up office visit in 6 weeks, call sooner if needed with pfts on return

## 2015-08-29 NOTE — Progress Notes (Signed)
Subjective:    Patient ID: Jocelyn Shelton, female    DOB: [birth year],    MRN: [id number]  HPI  58 yobf smoker with scleroderma since 1986 with onset of cough > sob x 1990s  referred to pulmonary clinic 08/29/2015 by Dr Tenny Craw   08/29/2015 1st [facility] office visit/ [facility]   Chief Complaint  Patient presents with  . Advice Only    refer Dr. Tenny Craw. C/o mostly dry cough (at times coughs up yellow phlem), wheezing occas, nasal cong, PND. Cough going on x oct 2016.  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 No unusual exp hx or h/o childhood pna/ asthma or knowledge of premature birth.  Sleeping ok without nocturnal  or early am exacerbation  of respiratory  c/o's or need for noct saba. Also denies any obvious fluctuation of symptoms with weather or environmental changes or other aggravating or alleviating factors except as outlined above   Current Medications, Allergies, Complete Past Medical History, Past Surgical History, Family History, and Social History were reviewed in [employer] record.              Review of Systems  Constitutional: Negative for fever, chills and unexpected weight change.  HENT: Positive for congestion. Negative for dental problem, ear pain, nosebleeds, postnasal drip, rhinorrhea, sinus pressure, sneezing, sore throat, trouble swallowing and voice change.   Eyes: Negative for visual disturbance.  Respiratory: Positive for cough, shortness of breath and wheezing. Negative for choking.   Cardiovascular: Negative for chest pain and leg swelling.  Gastrointestinal: Negative for vomiting, abdominal pain and diarrhea.  Genitourinary:  Negative for difficulty urinating.  Musculoskeletal: Negative for arthralgias.  Skin: Negative for rash.  Neurological: Negative for tremors, syncope and headaches.  Hematological: Does not bruise/bleed easily.       Objective:   Physical Exam  amb bf nad with constant throat clearing/ dry cough    Wt Readings from Last 3 Encounters:  08/29/15 151 lb 12.8 oz (68.856 kg)  06/14/15 140 lb 9.6 oz (63.776 kg)  05/31/15 140 lb 12.8 oz (63.866 kg)    Vital signs reviewed    HEENT: nl dentition, turbinates, and oropharynx. Nl external ear canals without cough reflex   NECK :  without JVD/Nodes/TM/ nl carotid upstrokes bilaterally   LUNGS: no acc muscle use,  Nl contour chest which is clear to A and P bilaterally without cough on insp or exp maneuvers   CV:  RRR  no s3 or murmur or increase in P2, no edema   ABD:  soft and nontender with nl inspiratory excursion in the supine position. No bruits or organomegaly, bowel sounds nl  MS:  Nl gait/ ext warm without deformities, calf tenderness, cyanosis or clubbing No obvious joint restrictions   SKIN: warm and dry without lesions    NEURO:  alert, approp, nl sensorium with  no motor deficits     I personally reviewed images and agree with radiology impression as follows:  CT Chest   07/31/15 1. Bibasilar subpleural interstitial prominence, slightly greater on the left. This is  similar to the 04/27/2011, and suspicious for interstitial lung disease. Favor usual interstitial pneumonitis pattern, as can be seen with collagen vascular disease (such is in this patient) or with idiopathic pulmonary fibrosis. 2. No pulmonary nodule or mass. 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 3. Resolution of bilateral pleural effusions since 02/09/2015. 4. Dilated esophagus with fluid level within. Likely related to the clinical history of scleroderma.           Assessment & Plan:

## 2015-08-30 DIAGNOSIS — R05 Cough: Secondary | ICD-10-CM | POA: Insufficient documentation

## 2015-08-30 DIAGNOSIS — R058 Other specified cough: Secondary | ICD-10-CM | POA: Insufficient documentation

## 2015-08-30 DIAGNOSIS — J449 Chronic obstructive pulmonary disease, unspecified: Secondary | ICD-10-CM | POA: Insufficient documentation

## 2015-08-30 NOTE — Assessment & Plan Note (Signed)

## 2015-08-30 NOTE — Assessment & Plan Note (Signed)
Not clear how much copd is present > return for pfts and work on d/c cigs (see separate a/p)   - The proper method of use, as well as anticipated side effects, of a metered-dose inhaler are discussed and demonstrated to the patient. Improved effectiveness after extensive coaching during this visit to a level of approximately 50 % from a baseline of 0 %   Try atrovent 2 qid for now pending f/u pfts

## 2015-08-30 NOTE — Assessment & Plan Note (Signed)
The most common causes of chronic cough in immunocompetent adults include the following: upper airway cough syndrome (UACS), previously referred to as postnasal drip syndrome (PNDS), which is caused by variety of rhinosinus conditions; (2) asthma; (3) GERD; (4) chronic bronchitis from cigarette smoking or other inhaled environmental irritants; (5) nonasthmatic eosinophilic bronchitis; and (6) bronchiectasis.   These conditions, singly or in combination, have accounted for up to 94% of the causes of chronic cough in prospective studies.   Other conditions have constituted no >6% of the causes in prospective studies These have included bronchogenic carcinoma, chronic interstitial pneumonia, sarcoidosis, left ventricular failure, ACEI-induced cough, and aspiration from a condition associated with pharyngeal dysfunction.    Chronic cough is often simultaneously caused by more than one condition. A single cause has been found from 38 to 82% of the time, multiple causes from 18 to 62%. Multiply caused cough has been the result of three diseases up to 42% of the time.       Based on hx and exam, this is most likely:  Classic Upper airway cough syndrome, so named because it's frequently impossible to sort out how much is  CR/sinusitis with freq throat clearing (which can be related to primary GERD)   vs  causing  secondary (" extra esophageal")  GERD from wide swings in gastric pressure that occur with throat clearing, often  promoting self use of mint and menthol lozenges that reduce the lower esophageal sphincter tone and exacerbate the problem further in a cyclical fashion.   These are the same pts (now being labeled as having "irritable larynx syndrome" by some cough centers) who not infrequently have a history of having failed to tolerate ace inhibitors,  dry powder inhalers or biphosphonates or report having atypical reflux symptoms that don't respond to standard doses of PPI , and are easily confused as  having aecopd or asthma flares by even experienced allergists/ pulmonologists.   The first step is to maximize rx for gerd and eliminate cyclical coughing with non min and menthol hard rock candies then regroup with pfts to sort out issue of airflow obst from smoking vs ild from RA/?  sjorgrens  I had an extended discussion with the patient reviewing all relevant studies completed to date and  lasting 35 min  1) Explained: The standardized cough guidelines published in Chest by [place] in 2006 are still the best available and consist of a multiple step process (up to 12!) , not a single office visit,  and are intended  to address this problem logically,  with an alogrithm dependent on response to empiric treatment at  each progressive step  to determine a specific diagnosis with  minimal addtional testing needed. Therefore if adherence is an issue or can't be accurately verified,  it's very unlikely the standard evaluation and treatment will be successful here.    Furthermore, response to therapy (other than acute cough suppression, which should only be used short term with avoidance of narcotic containing cough syrups if possible), can be a gradual process for which the patient may perceive immediate benefit.  Unlike going to an eye doctor where the best perscription is almost always the first one and is immediately effective, this is almost never the case in the management of chronic cough syndromes. Therefore the patient needs to commit up front to consistently adhere to recommendations  for up to 6 weeks of therapy directed at the likely underlying problem(s) before the response can be reasonably evaluated.  2) Each maintenance medication was reviewed in detail including most importantly the difference between maintenance and prns and under what circumstances the prns are to be triggered using an action plan format that is not reflected in the computer generated alphabetically organized  AVS.    Please see instructions for details which were reviewed in writing and the patient given a copy highlighting the part that I personally wrote and discussed at today's ov.   See instructions for specific recommendations which were reviewed directly with the patient who was given a copy with highlighter outlining the key components.

## 2015-10-31 ENCOUNTER — Ambulatory Visit: Payer: Medicaid Other | Admitting: Internal Medicine

## 2015-11-24 ENCOUNTER — Other Ambulatory Visit: Payer: Self-pay | Admitting: Family Medicine

## 2015-11-24 DIAGNOSIS — J449 Chronic obstructive pulmonary disease, unspecified: Secondary | ICD-10-CM

## 2015-11-25 ENCOUNTER — Other Ambulatory Visit: Payer: Self-pay | Admitting: Family Medicine

## 2015-11-25 DIAGNOSIS — F339 Major depressive disorder, recurrent, unspecified: Secondary | ICD-10-CM

## 2015-11-25 DIAGNOSIS — K219 Gastro-esophageal reflux disease without esophagitis: Secondary | ICD-10-CM

## 2015-12-16 ENCOUNTER — Encounter: Payer: Self-pay | Admitting: Family Medicine

## 2015-12-16 ENCOUNTER — Ambulatory Visit (INDEPENDENT_AMBULATORY_CARE_PROVIDER_SITE_OTHER): Payer: Medicaid Other | Admitting: Family Medicine

## 2015-12-16 VITALS — BP 133/90 | HR 102 | Temp 98.3°F | Ht 66.0 in | Wt 144.2 lb

## 2015-12-16 DIAGNOSIS — J449 Chronic obstructive pulmonary disease, unspecified: Secondary | ICD-10-CM | POA: Diagnosis not present

## 2015-12-16 DIAGNOSIS — R058 Other specified cough: Secondary | ICD-10-CM

## 2015-12-16 DIAGNOSIS — G894 Chronic pain syndrome: Secondary | ICD-10-CM

## 2015-12-16 DIAGNOSIS — Z720 Tobacco use: Secondary | ICD-10-CM | POA: Diagnosis not present

## 2015-12-16 DIAGNOSIS — R05 Cough: Secondary | ICD-10-CM | POA: Diagnosis not present

## 2015-12-16 DIAGNOSIS — F17210 Nicotine dependence, cigarettes, uncomplicated: Secondary | ICD-10-CM

## 2015-12-16 MED ORDER — MOMETASONE FURO-FORMOTEROL FUM 200-5 MCG/ACT IN AERO: 2.0000 | INHALATION_SPRAY | Freq: Two times a day (BID) | RESPIRATORY_TRACT | Status: DC

## 2015-12-16 MED ORDER — ALBUTEROL SULFATE HFA 108 (90 BASE) MCG/ACT IN AERS: 2.0000 | INHALATION_SPRAY | RESPIRATORY_TRACT | Status: DC | PRN

## 2015-12-16 NOTE — Patient Instructions (Signed)
Thank you for coming in to clinic today.  1. For Cough - Most likely related to continued smoking. Strongly recommend quitting or cutting back. You will need to be ready to quit before we fully discuss all of the methods to help you. But first try contacting [facility] to speak with a free smoking cessation counselor 1-800-QUIT-NOW 619-032-3528) - Also cough could be related to acid reflux, continue the medicines you are on as these are helping, may elevate head of bed, and also quit smoking to help acid reflux - Try Dulera inhaler 2 puffs twice a day for next few weeks, follow-up with Dr Sherene Sires since not improving, may need alternative medications - If breathing not better with inhalers, then maybe we will pursue GI referral to specialist to look into esophagus with a scope  2. For Arthritis / Chronic Pain/ Fibromyalgia - Stop the Flexeril at this time since it seems to not be helping and maybe causing urinary difficulty - Increase Tylenol dose to 500mg  tabs x 2 per dose up to 3 times a day, continue other medications - Stay well hydrated with water  Please schedule a follow-up appointment with Dr anytime within next 6 weeks still difficulty breathing / coughing after seeing Pulmonologist  If you have any other questions or concerns, please feel free to call the clinic to contact me. You may also schedule an earlier appointment if necessary.  However, if your symptoms get significantly worse, please go to the Emergency Department to seek immediate medical attention.  Althea Charon, DO [facility]

## 2015-12-16 NOTE — Progress Notes (Signed)
Subjective:    Patient ID: Jocelyn Shelton, female    DOB: [birth year], 60 y.o.   MRN: [id number]  Jocelyn Shelton is a 60 y.o. female presenting on 12/16/2015 for Cough   History per patient and daughter.   HPI  COUGH / History of COPD: - Chronic problem with known COPD active smoker, followed by [facility] Pulmonology Dr Sherene Sires, last seen 08/2015, thought cough syndrome with multiple contributing factors including GERD, smoking. Chronic history of scleroderma and pulmonary fibrosis. - Today presents with worsening cough for up to 1 month, other constellation of symptoms with some decreased activity and poor PO. Describes some prior heartburn since improved on pepcid and omeprazole. Does have sensation in back of throat, makes cough worse, also cough when lays down. - Admits occasional dyspnea with bad coughing spells - Denies significant productive cough, chest pain, nausea, vomiting  URINARY URGENCY / DIFFICULTY - She reports drinking more water recently and feeling bloated, but thinks she is voiding less often 2-4 x daily, over past month, does have some urinary urgency but not always voiding. - No stress incontinence - Denies dysuria, hematuria  TOBACCO DEPENCE: - Active smoker. Cigarettes, has cut back, chronic history. Previously seen Dr Raymondo Band for smoking cessation. Never quit before. Never on medication or nicotine replacement - Not ready to quit today  Social History  Substance Use Topics  . Smoking status: Current Every Day Smoker -- 0.50 packs/day for 40 years    Types: Cigarettes  . Smokeless tobacco: None  . Alcohol Use: 2.4 oz/week    4 Standard drinks or equivalent per week    Review of Systems Per HPI unless specifically indicated above     Objective:    BP 133/90 mmHg  Pulse 102  Temp(Src) 98.3 F (36.8 C) (Oral)  Ht 5\' 6"  (1.676 m)  Wt 144 lb 3.2 oz (65.409 kg)  BMI 23.29 kg/m2  SpO2 92%  Wt Readings from Last 3 Encounters:  12/16/15 144 lb 3.2 oz (65.409  kg)  08/29/15 151 lb 12.8 oz (68.856 kg)  06/14/15 140 lb 9.6 oz (63.776 kg)    Physical Exam  Constitutional: She appears well-developed and well-nourished. No distress.  Well-appearing, comfortable, cooperative - some frequent coughing spells non-productive resolves with drink of water  HENT:  Head: Normocephalic and atraumatic.  Mouth/Throat: Oropharynx is clear and moist.  Eyes: Conjunctivae are normal.  Cardiovascular: Regular rhythm, normal heart sounds and intact distal pulses.   No murmur heard. Mild tachycardic  Pulmonary/Chest: Effort normal and breath sounds normal. No respiratory distress. She has no wheezes. She has no rales.  Bibasilar coarse breath sounds without focal crackles. No wheezing. Good air movement bilateral. Speaks full sentences. Some upper airway congestion.  Abdominal: Soft. Bowel sounds are normal. She exhibits no distension and no mass. There is no tenderness. There is no rebound and no guarding.  Musculoskeletal: She exhibits no edema.  Back non-tender. No CVAT  Neurological: She is alert.  Skin: Skin is warm and dry. No rash noted. She is not diaphoretic.  Nursing note and vitals reviewed.  Results for orders placed or performed in visit on 07/12/15  Hepatic function panel  Result Value Ref Range   Total Bilirubin 0.2 0.2 - 1.2 mg/dL   Bilirubin, Direct 0.1 <=0.2 mg/dL   Indirect Bilirubin 0.1 (L) 0.2 - 1.2 mg/dL   Alkaline Phosphatase 96 33 - 130 U/L   AST 21 10 - 35 U/L   ALT 16 6 - 29 U/L  Total Protein 6.9 6.1 - 8.1 g/dL   Albumin 3.4 (L) 3.6 - 5.1 g/dL  Lipid Profile  Result Value Ref Range   Cholesterol 98 (L) 125 - 200 mg/dL   Triglycerides 60 <427 mg/dL   HDL 52 >=06 mg/dL   Total CHOL/HDL Ratio 1.9 <=5.0 Ratio   VLDL 12 <30 mg/dL   LDL Cholesterol 34 <237 mg/dL      Assessment & Plan:   Problem List Items Addressed This Visit    Upper airway cough syndrome    Likely contributing to active cough now, non productive. Not COPD  exac and not consistent with PNA. - Treat underlying COPD, GERD - Limited options for GERD already on H2 and PPI with improved heartburn. Advised if adjusting inhalers Dulera and Albuterol does not help, follow-up with pulm, then if no relief will consider referral to GI as next step to consider endoscopy      Relevant Medications   albuterol (PROVENTIL HFA;VENTOLIN HFA) 108 (90 Base) MCG/ACT inhaler   COPD pfts pending  - Primary    Likely underling COPD component, also concern with prior pulm history with restrictive lung disease, and active smoker with likely cough syndrome. No clinical evidence of acute COPD exac today. No active wheezing. Pulse ox is 92% on room air. No resp distress.  Plan: 1. Given sample of Dulera to try 2 puffs BID for ICS trial 2. Continue Atrovent as advised prior by pulm, also given rx Albuterol as trial for rescue inhaler 2 puffs q 4-6 hr PRN 3. Follow-up with Pulmonology for PFTs and follow-up if [facility] helping 4. Smoking cessation counseling, patient not ready to quit, can return to discuss options further      Relevant Medications   albuterol (PROVENTIL HFA;VENTOLIN HFA) 108 (90 Base) MCG/ACT inhaler   mometasone-formoterol (DULERA) 200-5 MCG/ACT AERO   Cigarette smoker    Active smoker still, not ready to quit mentally - Provided smoking cessation counseling, and advised close follow-up once ready - Given # for [place] quit line to call for counseling      Chronic pain syndrome    Continue to follow-up with Rheumatology. Question of fibromyalgia with RA as well. - Off chronic prednisone. Continue MTX, SSA - Stop the Flexeril at this time since it seems to not be helping and maybe causing urinary difficulty - Increase Tylenol dose to 500mg  tabs x 2 per dose up to 3 times a day, continue other medications - Considered alternative agents, such as TCA in fibro/chronic pain, will defer to Rheumatology ,also note allergy to Cymbalta         Meds ordered this  encounter  Medications  . albuterol (PROVENTIL HFA;VENTOLIN HFA) 108 (90 Base) MCG/ACT inhaler    Sig: Inhale 2 puffs into the lungs every 4 (four) hours as needed for wheezing or shortness of breath (cough).    Dispense:  1 Inhaler    Refill:  1  . mometasone-formoterol (DULERA) 200-5 MCG/ACT AERO    Sig: Inhale 2 puffs into the lungs 2 (two) times daily.    Dispense:  8.8 g    Refill:  0    Order Specific Question:  Lot Number?    Answer:     Order Specific Question:  Expiration Date?    Answer:  07/24/2016    Order Specific Question:  Manufacturer?    Answer:  [employer] [[id number]]    Order Specific Question:  Quantity    Answer:  1  Follow up plan: Return in about 4 weeks (around 01/13/2016) for smoking cessation, COPD, cough.  Saralyn Pilar, DO [facility], PGY-3

## 2015-12-17 NOTE — Assessment & Plan Note (Addendum)
Continue to follow-up with Rheumatology. Question of fibromyalgia with RA as well. - Off chronic prednisone. Continue MTX, SSA - Stop the Flexeril at this time since it seems to not be helping and maybe causing urinary difficulty - Increase Tylenol dose to 500mg  tabs x 2 per dose up to 3 times a day, continue other medications - Considered alternative agents, such as TCA in fibro/chronic pain, will defer to Rheumatology ,also note allergy to Cymbalta

## 2015-12-17 NOTE — Assessment & Plan Note (Addendum)
Active smoker still, not ready to quit mentally - Provided smoking cessation counseling, and advised close follow-up once ready - Given # for [place] quit line to call for counseling

## 2015-12-17 NOTE — Assessment & Plan Note (Signed)
Likely contributing to active cough now, non productive. Not COPD exac and not consistent with PNA. - Treat underlying COPD, GERD - Limited options for GERD already on H2 and PPI with improved heartburn. Advised if adjusting inhalers Dulera and Albuterol does not help, follow-up with pulm, then if no relief will consider referral to GI as next step to consider endoscopy

## 2015-12-17 NOTE — Assessment & Plan Note (Signed)
Likely underling COPD component, also concern with prior pulm history with restrictive lung disease, and active smoker with likely cough syndrome. No clinical evidence of acute COPD exac today. No active wheezing. Pulse ox is 92% on room air. No resp distress.  Plan: 1. Given sample of Dulera to try 2 puffs BID for ICS trial 2. Continue Atrovent as advised prior by pulm, also given rx Albuterol as trial for rescue inhaler 2 puffs q 4-6 hr PRN 3. Follow-up with Pulmonology for PFTs and follow-up if [facility] helping 4. Smoking cessation counseling, patient not ready to quit, can return to discuss options further

## 2015-12-26 ENCOUNTER — Emergency Department (HOSPITAL_COMMUNITY)
Admission: EM | Admit: 2015-12-26 | Discharge: 2015-12-26 | Disposition: A | Discharge status: Home / Self Care | Payer: Medicaid Other | Attending: Emergency Medicine | Admitting: Emergency Medicine

## 2015-12-26 ENCOUNTER — Emergency Department (HOSPITAL_COMMUNITY): Payer: Medicaid Other

## 2015-12-26 ENCOUNTER — Encounter (HOSPITAL_COMMUNITY): Payer: Self-pay

## 2015-12-26 DIAGNOSIS — I10 Essential (primary) hypertension: Secondary | ICD-10-CM | POA: Insufficient documentation

## 2015-12-26 DIAGNOSIS — Z79899 Other long term (current) drug therapy: Secondary | ICD-10-CM | POA: Diagnosis not present

## 2015-12-26 DIAGNOSIS — R059 Cough, unspecified: Secondary | ICD-10-CM

## 2015-12-26 DIAGNOSIS — F17210 Nicotine dependence, cigarettes, uncomplicated: Secondary | ICD-10-CM | POA: Insufficient documentation

## 2015-12-26 DIAGNOSIS — R05 Cough: Secondary | ICD-10-CM | POA: Diagnosis present

## 2015-12-26 DIAGNOSIS — Z7982 Long term (current) use of aspirin: Secondary | ICD-10-CM | POA: Diagnosis not present

## 2015-12-26 DIAGNOSIS — I2510 Atherosclerotic heart disease of native coronary artery without angina pectoris: Secondary | ICD-10-CM | POA: Insufficient documentation

## 2015-12-26 LAB — CBC WITH DIFFERENTIAL/PLATELET
BASOS ABS: 0 10*3/uL (ref 0.0–0.1)
BASOS PCT: 0 %
Eosinophils Absolute: 0.1 10*3/uL (ref 0.0–0.7)
Eosinophils Relative: 3 %
HEMATOCRIT: 29.6 % — AB (ref 36.0–46.0)
Hemoglobin: 9.5 g/dL — ABNORMAL LOW (ref 12.0–15.0)
Lymphocytes Relative: 17 %
Lymphs Abs: 0.8 10*3/uL (ref 0.7–4.0)
MCH: 31.6 pg (ref 26.0–34.0)
MCHC: 32.1 g/dL (ref 30.0–36.0)
MCV: 98.3 fL (ref 78.0–100.0)
MONO ABS: 0.4 10*3/uL (ref 0.1–1.0)
Monocytes Relative: 9 %
NEUTROS ABS: 3.4 10*3/uL (ref 1.7–7.7)
NEUTROS PCT: 71 %
Platelets: 204 10*3/uL (ref 150–400)
RBC: 3.01 MIL/uL — ABNORMAL LOW (ref 3.87–5.11)
RDW: 14.1 % (ref 11.5–15.5)
WBC: 4.8 10*3/uL (ref 4.0–10.5)

## 2015-12-26 LAB — BASIC METABOLIC PANEL
ANION GAP: 8 (ref 5–15)
BUN: 13 mg/dL (ref 6–20)
CALCIUM: 9 mg/dL (ref 8.9–10.3)
CO2: 27 mmol/L (ref 22–32)
Chloride: 98 mmol/L — ABNORMAL LOW (ref 101–111)
Creatinine, Ser: 1.04 mg/dL — ABNORMAL HIGH (ref 0.44–1.00)
GFR, EST NON AFRICAN AMERICAN: 57 mL/min — AB (ref >60)
GLUCOSE: 99 mg/dL (ref 65–99)
Potassium: 3.8 mmol/L (ref 3.5–5.1)
SODIUM: 133 mmol/L — AB (ref 135–145)

## 2015-12-26 LAB — D-DIMER, QUANTITATIVE (NOT AT ARMC): D DIMER QUANT: 0.54 ug{FEU}/mL — AB (ref 0.00–0.50)

## 2015-12-26 MED ORDER — BENZONATATE 100 MG PO CAPS: 100.0000 mg | ORAL_CAPSULE | Freq: Three times a day (TID) | ORAL | Status: DC

## 2015-12-26 MED ORDER — IOPAMIDOL (ISOVUE-370) INJECTION 76%
INTRAVENOUS | Status: AC
  Administered 2015-12-26: 100 mL
  Filled 2015-12-26: qty 100

## 2015-12-26 NOTE — ED Notes (Signed)
Pt states she understands instructions. Home via w/c with family.

## 2015-12-26 NOTE — ED Notes (Signed)
Patient transported to CT 

## 2015-12-26 NOTE — ED Notes (Signed)
Patient here with runny nose, cough and congestion for 1 month. Using inhalers with some relief

## 2015-12-26 NOTE — ED Notes (Signed)
Pt oob to br . Gait is steady but assist with cane as per norm.

## 2015-12-26 NOTE — ED Notes (Signed)
Pt returns from CT and ambulates to bathroom.

## 2015-12-26 NOTE — Discharge Instructions (Signed)
You need to follow-up with your pulmonologist regarding the ongoing cough. On your CT scan, you do have evidence of blockages in your heart arteries. You need to discuss this with your primary care provider.  Cough, Adult Coughing is a reflex that clears your throat and your airways. Coughing helps to heal and protect your lungs. It is normal to cough occasionally, but a cough that happens with other symptoms or lasts a long time may be a sign of a condition that needs treatment. A cough may last only 2-3 weeks (acute), or it may last longer than 8 weeks (chronic). CAUSES Coughing is commonly caused by:  Breathing in substances that irritate your lungs.  A viral or bacterial respiratory infection.  Allergies.  Asthma.  Postnasal drip.  Smoking.  Acid backing up from the stomach into the esophagus (gastroesophageal reflux).  Certain medicines.  Chronic lung problems, including COPD (or rarely, lung cancer).  Other medical conditions such as heart failure. HOME CARE INSTRUCTIONS  Pay attention to any changes in your symptoms. Take these actions to help with your discomfort:  Take medicines only as told by your health care provider.  If you were prescribed an antibiotic medicine, take it as told by your health care provider. Do not stop taking the antibiotic even if you start to feel better.  Talk with your health care provider before you take a cough suppressant medicine.  Drink enough fluid to keep your urine clear or pale yellow.  If the air is dry, use a cold steam vaporizer or humidifier in your bedroom or your home to help loosen secretions.  Avoid anything that causes you to cough at work or at home.  If your cough is worse at night, try sleeping in a semi-upright position.  Avoid cigarette smoke. If you smoke, quit smoking. If you need help quitting, ask your health care provider.  Avoid caffeine.  Avoid alcohol.  Rest as needed. SEEK MEDICAL CARE IF:   You  have new symptoms.  You cough up pus.  Your cough does not get better after 2-3 weeks, or your cough gets worse.  You cannot control your cough with suppressant medicines and you are losing sleep.  You develop pain that is getting worse or pain that is not controlled with pain medicines.  You have a fever.  You have unexplained weight loss.  You have night sweats. SEEK IMMEDIATE MEDICAL CARE IF:  You cough up blood.  You have difficulty breathing.  Your heartbeat is very fast.   This information is not intended to replace advice given to you by your health care provider. Make sure you discuss any questions you have with your health care provider.   Document Released: 01/09/2011 Document Revised: 04/03/2015 Document Reviewed: 09/19/2014 Elsevier Interactive Patient Education [employer]

## 2015-12-26 NOTE — ED Provider Notes (Signed)
CSN: [id number]     Arrival date & time 12/26/15  4098 History   First MD Initiated Contact with Patient 12/26/15 [phone]     Chief Complaint  Patient presents with  . Cough  . Nasal Congestion     (Consider location/radiation/quality/duration/timing/severity/associated sxs/prior Treatment) HPI Comments: Patient presents with runny nose cough and congestion for the last month. She states the cough is really bothering her. She feels like she has a lot of mucus in her throat and her chest. Eyes any chest pain. No shortness of breath. She does have some pain to her left ribs. It's worse with breathing and worse with coughing. It's also worse with movement. She denies any leg pain or swelling other than generalized achiness of her legs which she always has. She denies any known fevers. She does have a history of seasonal allergies and currently is on both Flonase and oral allergy medication. She also has a history of COPD. She was seen by her PCP for these symptoms last week. She was given a sample of dulera to use in addition to her albuterol inhaler. She doesn't feel that her congestion or cough is any better.  Patient is a 60 y.o. female presenting with cough.  Cough Associated symptoms: chest pain (left rib pain)   Associated symptoms: no chills, no diaphoresis, no fever, no headaches, no rash, no rhinorrhea and no shortness of breath     Past Medical History  Diagnosis Date  . Hypertension   . Reflux   . Scleroderma (HCC)   . Rheumatoid arthritis(714.0)   . S/P angioplasty with stent, 05/22/15 pRCA DES and pLCX DES 05/23/2015  . CAD in native artery 05/23/2015   Past Surgical History  Procedure Laterality Date  . Appendectomy  90's  . Cardiac catheterization N/A 05/22/2015    Procedure: Left Heart Cath and Coronary Angiography;  Surgeon: Lennette Bihari, MD;  Location: [facility] INVASIVE CV LAB;  Service: Cardiovascular;  Laterality: N/A;  . Cardiac catheterization N/A 05/22/2015    Procedure:  Coronary Stent Intervention;  Surgeon: Lennette Bihari, MD;  Location: [facility] INVASIVE CV LAB;  Service: Cardiovascular;  Laterality: N/A;   Family History  Problem Relation Age of Onset  . Hypertension Father   . Heart attack Mother   . Diabetes Daughter   . Diabetes Sister   . Lung cancer Brother 45   Social History  Substance Use Topics  . Smoking status: Current Every Day Smoker -- 0.50 packs/day for 40 years    Types: Cigarettes  . Smokeless tobacco: None  . Alcohol Use: 2.4 oz/week    4 Standard drinks or equivalent per week   OB History    No data available     Review of Systems  Constitutional: Negative for fever, chills, diaphoresis and fatigue.  HENT: Positive for congestion and postnasal drip. Negative for rhinorrhea and sneezing.   Eyes: Negative.   Respiratory: Positive for cough. Negative for chest tightness and shortness of breath.   Cardiovascular: Positive for chest pain (left rib pain). Negative for leg swelling.  Gastrointestinal: Negative for nausea, vomiting, abdominal pain, diarrhea and blood in stool.  Genitourinary: Negative for frequency, hematuria, flank pain and difficulty urinating.  Musculoskeletal: Negative for back pain and arthralgias.  Skin: Negative for rash.  Neurological: Negative for dizziness, speech difficulty, weakness, numbness and headaches.      Allergies  Ticagrelor; Aspirin; Cymbalta; Lisinopril; and Tomato  Home Medications   Prior to Admission medications   Medication Sig Start  Date End Date Taking? Authorizing Provider  acetaminophen (TYLENOL) 500 MG tablet Take 1,000 mg by mouth daily as needed for pain.     Historical Provider, MD  albuterol (PROVENTIL HFA;VENTOLIN HFA) 108 (90 Base) MCG/ACT inhaler Inhale 2 puffs into the lungs every 4 (four) hours as needed for wheezing or shortness of breath (cough). 12/16/15   Smitty Cords, DO  aspirin EC 81 MG EC tablet Take 1 tablet (81 mg total) by mouth daily. 05/24/15    Rhonda G Barrett, PA-C  ATROVENT HFA 17 MCG/ACT inhaler INHALE TWO PUFFS BY MOUTH ONCE DAILY AS NEEDED FOR WHEEZING 11/25/15   Smitty Cords, DO  benzonatate (TESSALON) 100 MG capsule Take 1 capsule (100 mg total) by mouth every 8 (eight) hours. 12/26/15   Rolan Bucco, MD  bismuth subsalicylate (PEPTO BISMOL) 262 MG/15ML suspension Take 30 mLs by mouth every 6 (six) hours as needed for indigestion.    Historical Provider, MD  Black Cohosh 40 MG CAPS Take by mouth daily.    Historical Provider, MD  CALCIUM PO Take 1 tablet by mouth daily.    Historical Provider, MD  chlorpheniramine (CHLOR-TRIMETON) 4 MG tablet Take 8 mg by mouth at bedtime.    Historical Provider, MD  EFFIENT 10 MG TABS tablet TAKE ONE TABLET BY MOUTH DAILY 06/25/15   Joline Salt Barrett, PA-C  famotidine (PEPCID) 20 MG tablet One at bedtime 08/29/15   Nyoka Cowden, MD  fluticasone [facility]) 50 MCG/ACT nasal spray USE TWO SPRAY(S) IN EACH NOSTRIL DAILY AS NEEDED FOR  RHINITIS 07/25/15   Smitty Cords, DO  folic acid (FOLVITE) 1 MG tablet TAKE 1 TABLET (1 MG TOTAL) BY MOUTH DAILY. 01/22/15   Smitty Cords, DO  methotrexate (RHEUMATREX) 2.5 MG tablet Take 5 tablets (12.5 mg total) by mouth 2 (two) times a week. Take 5 tablets every Mon and Tues. Caution:Chemotherapy. Protect from light. 02/25/15   Smitty Cords, DO  metoprolol (LOPRESSOR) 50 MG tablet Take 1 tablet (50 mg total) by mouth 2 (two) times daily. 05/24/15   Rhonda G Barrett, PA-C  mometasone-formoterol (DULERA) 200-5 MCG/ACT AERO Inhale 2 puffs into the lungs 2 (two) times daily. 12/16/15   Smitty Cords, DO  Multiple Vitamin (MULTIVITAMIN WITH MINERALS) TABS tablet Take 1 tablet by mouth daily.    Historical Provider, MD  nitroGLYCERIN (NITROSTAT) 0.4 MG SL tablet Place 0.4 mg under the tongue every 5 (five) minutes as needed for chest pain (x 3pills daily).    Historical Provider, MD  OLANZapine (ZYPREXA) 5 MG tablet TAKE ONE  TABLET BY MOUTH AT BEDTIME 11/25/15   Smitty Cords, DO  omeprazole (PRILOSEC) 40 MG capsule TAKE ONE CAPSULE BY MOUTH ONCE DAILY 11/25/15   Smitty Cords, DO  sulfaSALAzine (AZULFIDINE) 500 MG tablet Take 1,000 mg by mouth 2 (two) times daily.    Historical Provider, MD  triamterene-hydrochlorothiazide (MAXZIDE) 75-50 MG tablet Take 0.5 tablets by mouth daily. 05/24/15   Rhonda G Barrett, PA-C   BP 139/85 mmHg  Pulse 87  Temp(Src) 98.6 F (37 C) (Oral)  Resp 17  SpO2 100% Physical Exam  Constitutional: She is oriented to person, place, and time. She appears well-developed and well-nourished.  HENT:  Head: Normocephalic and atraumatic.  Right Ear: External ear normal.  Left Ear: External ear normal.  Mouth/Throat: Oropharynx is clear and moist.  Eyes: Pupils are equal, round, and reactive to light.  Neck: Normal range of motion. Neck supple.  Cardiovascular: Normal  rate, regular rhythm and normal heart sounds.   Pulmonary/Chest: Effort normal and breath sounds normal. No respiratory distress. She has no wheezes. She has no rales. She exhibits no tenderness.  Abdominal: Soft. Bowel sounds are normal. There is no tenderness. There is no rebound and no guarding.  Musculoskeletal: Normal range of motion. She exhibits no edema.  No edema or calf tenderness  Lymphadenopathy:    She has no cervical adenopathy.  Neurological: She is alert and oriented to person, place, and time.  Skin: Skin is warm and dry. No rash noted.  Psychiatric: She has a normal mood and affect.    ED Course  Procedures (including critical care time) Labs Review Labs Reviewed  D-DIMER, QUANTITATIVE (NOT AT [facility] - Abnormal; Notable for the following:    D-Dimer, Quant 0.54 (*)    All other components within normal limits  BASIC METABOLIC PANEL - Abnormal; Notable for the following:    Sodium 133 (*)    Chloride 98 (*)    Creatinine, Ser 1.04 (*)    GFR calc non Af Amer 57 (*)    All other  components within normal limits  CBC WITH DIFFERENTIAL/PLATELET - Abnormal; Notable for the following:    RBC 3.01 (*)    Hemoglobin 9.5 (*)    HCT 29.6 (*)    All other components within normal limits    Imaging Review Dg Chest 2 View  12/26/2015  CLINICAL DATA:  One month of cough, soreness over the lateral aspect of the chest EXAM: CHEST  2 VIEW COMPARISON:  Chest x-ray of July 05, 2015 and CT scan of the chest of August 01, 2015. FINDINGS: The lungs remain hyperinflated with hemidiaphragm flattening. The interstitial markings are coarse predominantly in the left lower lobe. Subtle nodular density that projects lateral to the cardiac apex is consistent with a nipple shadow. There is no pleural effusion. The heart and pulmonary vascularity are normal. The trachea is midline. The bony thorax exhibits no acute abnormality. IMPRESSION: Slight interval increase in interstitial density posteriorly in the left lower lobe. This may reflect a pneumonitis or early interstitial pneumonia. Given the persistent cough over the past month, followup PA and lateral chest X-ray is recommended in 3-4 weeks following trial of antibiotic therapy to ensure resolution and exclude underlying malignancy. Electronically Signed   By: David  [place] M.D.   On: 12/26/2015 09:31   Ct Angio Chest Pe W/cm &/or Wo Cm  12/26/2015  CLINICAL DATA:  Cough. Left-sided chest soreness. Symptoms for 1 month. Scleroderma and rheumatoid arthritis. EXAM: CT ANGIOGRAPHY CHEST WITH CONTRAST TECHNIQUE: Multidetector CT imaging of the chest was performed using the standard protocol during bolus administration of intravenous contrast. Multiplanar CT image reconstructions and MIPs were obtained to evaluate the vascular anatomy. CONTRAST:  100 cc of Isovue 370 COMPARISON:  Plain film of earlier today.  CT of 08/01/2015. FINDINGS: Mediastinum/Nodes: The quality of this exam for evaluation of pulmonary embolism is good. No evidence of pulmonary  embolism. Multiple small bilateral axillary nodes have been present back to 02/09/2015 are likely reactive. Aortic and branch vessel atherosclerosis. Tortuous thoracic aorta. Mild cardiomegaly, accentuated by a mild pectus excavatum deformity. Multivessel coronary artery atherosclerosis. No mediastinal or hilar adenopathy. Moderately dilated thoracic esophagus with fluid level within. Similar. Lungs/Pleura: No pleural fluid. Mild paraseptal emphysema. Redemonstration of interstitial coarsening at the lung bases, slightly greater on the left. Similar. No airspace consolidation. Upper abdomen: Normal imaged portions of the liver, spleen, stomach, pancreas, adrenal glands,  left kidney. Musculoskeletal: No acute osseous abnormality. Review of the MIP images confirms the above findings. IMPRESSION: 1.  No evidence of pulmonary embolism. 2.  Atherosclerosis, including within the coronary arteries. 3. Re- demonstration of a dilated esophagus with fluid level within. Likely related to the clinical history of scleroderma. 4. 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 Similar. Electronically Signed   By: Jeronimo Greaves M.D.   On: 12/26/2015 12:42   I have personally reviewed and evaluated these images and lab results as part of my medical decision-making.   EKG Interpretation   Date/Time:  Thursday December 26 2015 08:22:51 EDT Ventricular Rate:  94 PR Interval:  116 QRS Duration: 80 QT Interval:  364 QTC Calculation: 455 R Axis:   96 Text Interpretation:  Sinus rhythm with Premature atrial complexes Right  atrial enlargement Rightward axis Pulmonary disease pattern Abnormal ECG  since last tracing no significant change Confirmed by Khaliyah Northrop  MD, Kelsen Celona  ([id number]) on 12/26/2015 12:52:47 PM      MDM   Final diagnoses:  Cough    Patient presents with cough and left side chest pain. The chest pain sounds more musculoskeletal. It's worse with coughing and movement.  There is no evidence of a pulmonary embolus. Her oxygen saturation saturations are normal. There is no suggestions of acute pneumonia. Her lungs are clear. She was discharged home in good condition. She was given a prescription for Tessalon Perles for the cough. She will continue to use her other medications as directed. She was advised to follow-up with her pulmonologist. She also was noted to have coronary artery disease on her CT scan. She was advised to follow-up with her PCP regarding this. On chart review, it seems like this is known disease.    Rolan Bucco, MD 12/26/15 1254

## 2016-01-02 ENCOUNTER — Encounter: Payer: Self-pay | Admitting: Adult Health

## 2016-01-02 ENCOUNTER — Ambulatory Visit (INDEPENDENT_AMBULATORY_CARE_PROVIDER_SITE_OTHER): Payer: Medicaid Other | Admitting: Adult Health

## 2016-01-02 ENCOUNTER — Other Ambulatory Visit (INDEPENDENT_AMBULATORY_CARE_PROVIDER_SITE_OTHER): Payer: Medicaid Other

## 2016-01-02 VITALS — BP 122/76 | HR 74 | Ht 66.0 in | Wt 146.4 lb

## 2016-01-02 DIAGNOSIS — J449 Chronic obstructive pulmonary disease, unspecified: Secondary | ICD-10-CM | POA: Diagnosis not present

## 2016-01-02 DIAGNOSIS — R059 Cough, unspecified: Secondary | ICD-10-CM

## 2016-01-02 DIAGNOSIS — R05 Cough: Secondary | ICD-10-CM

## 2016-01-02 LAB — SEDIMENTATION RATE: Sed Rate: 26 mm/hr (ref 0–30)

## 2016-01-02 LAB — BRAIN NATRIURETIC PEPTIDE: Pro B Natriuretic peptide (BNP): 160 pg/mL — ABNORMAL HIGH (ref 0.0–100.0)

## 2016-01-02 MED ORDER — BENZONATATE 200 MG PO CAPS: 200.0000 mg | ORAL_CAPSULE | Freq: Three times a day (TID) | ORAL | Status: DC | PRN

## 2016-01-02 MED ORDER — PREDNISONE 10 MG PO TABS
ORAL_TABLET | ORAL | Status: DC
Start: 2016-01-02 — End: 2016-03-06

## 2016-01-02 NOTE — Patient Instructions (Addendum)
Prednisone taper over next week.  Delsym 2 tsp Twice daily  As needed  Cough.  Tessalon Three times a day  As needed  Cough .  Work on not smoking  Labs today .  Follow up Dr. Sherene Sires  In 2-3 weeks with PFT and As needed   Please contact office for sooner follow up if symptoms do not improve or worsen or seek emergency care

## 2016-01-02 NOTE — Progress Notes (Signed)
Chart and office note reviewed in detail  > agree with a/p as outlined    

## 2016-01-02 NOTE — Assessment & Plan Note (Addendum)
?  Flare with underlying RA /Scleroderma  ? 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  Encouraged on smoking cessationi  Need PFT  Check ESR and BNP   Plan  Prednisone taper over next week.  Delsym 2 tsp Twice daily  As needed  Cough.  Tessalon Three times a day  As needed  Cough .  Work on not smoking  Labs today .  Follow up Dr. Melvyn Novas  In 2-3 weeks with PFT and As needed   Please contact office for sooner follow up if symptoms do not improve or worsen or seek emergency care

## 2016-01-02 NOTE — Progress Notes (Signed)
Subjective:    Patient ID: Jocelyn Shelton, female    DOB: [birth year],    MRN: [id number]  HPI  63 yobf smoker with scleroderma since 1986 with onset of cough > sob x 1990s  referred to pulmonary clinic 08/29/2015 by Dr Tenny Craw   08/29/2015 1st [facility] office visit/ [place]   Chief Complaint  Patient presents with  . Advice Only    refer Dr. Tenny Craw. C/o mostly dry cough (at times coughs up yellow phlem), wheezing occas, nasal cong, PND. Cough going on x oct 2016.  cough x years but worse since admit 04/2015 Takes prn prednisone for joints no change in resp symptoms on vs off On atovent bid but hfa 0% Taking ppi hs only  Doe = MMRC2 = can't walk a nl pace on a flat grade s sob >>change atrovent to Four times a day    01/02/2016  Acute OV  Pt returns for 4 month follow up .  Says her cough is not that much better Feels cough is worse for last 3-4 months .  Pt getting up white mucus at times and vomits with heavy cough. No change in SOB since last OV.  Seen in the ED 12/26/2015 for cough spasm -  Tessalon perles not working. CT chest 11/25/15 was neg for PE, stable mild ILD changes.   Notes some cramping under right breast from coughing. Pt has RA and Scleroderma on Methotrexate  Remains on Dulera .  Cough is quite aggravating to her.  She denies any fever, chest pain, orthopnea, PND, or increased leg swelling Still smoking , discussed cessation   Current Medications, Allergie s, Complete Past Medical History, Past Surgical History, Family History, and Social History were reviewed in [employer] record.              Review of Systems  Constitutional: Negative for fever, chills and unexpected weight change.  HENT:  Negative for dental problem, ear pain, nosebleeds, postnasal drip, rhinorrhea, sinus pressure, sneezing, sore throat, trouble swallowing and voice change.   Eyes: Negative for visual disturbance.  Respiratory:  Negative for choking.     Cardiovascular: Negative for chest pain and leg swelling.  Gastrointestinal: Negative for vomiting, abdominal pain and diarrhea.  Genitourinary: Negative for difficulty urinating.  Musculoskeletal: Negative for arthralgias.  Skin: Negative for rash.  Neurological: Negative for tremors, syncope and headaches.  Hematological: Does not bruise/bleed easily.       Objective:   Physical Exam  amb bf nad with constant throat clearing/ dry cough  Filed Vitals:   01/02/16 1522  BP: 122/76  Pulse: 74  Height: 5\' 6"  (1.676 m)  Weight: 146 lb 6.4 oz (66.407 kg)  SpO2: 100%      Vital signs reviewed    HEENT: nl dentition, turbinates, and oropharynx. Nl external ear canals without cough reflex   NECK :  without JVD/Nodes/TM/ nl carotid upstrokes bilaterally   LUNGS: no acc muscle use,  Nl contour chest which is clear to A and P bilaterally without cough on insp or exp maneuvers   CV:  RRR  no s3 or murmur or increase in P2, no edema   ABD:  soft and nontender with nl inspiratory excursion in the supine position. No bruits or organomegaly, bowel sounds nl  MS:  Nl gait/ ext warm without deformities, calf tenderness, cyanosis or clubbing No obvious joint restrictions   SKIN: warm and dry without lesions    NEURO:  alert, approp,  nl sensorium with  no motor deficits      CT Chest   07/31/15 1. Bibasilar subpleural interstitial prominence, slightly greater on the left. This is similar to the 04/27/2011, and suspicious for interstitial lung disease. Favor usual interstitial pneumonitis pattern, as can be seen with collagen vascular disease (such is in this patient) or with idiopathic pulmonary fibrosis. 2. No pulmonary nodule or mass. 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 3. Resolution of bilateral pleural effusions since 02/09/2015. 4. Dilated esophagus with fluid level within. Likely related to the clinical history of scleroderma.     Rashaun Curl NP-C  [facility]  01/02/2016       Assessment & Plan:

## 2016-01-03 ENCOUNTER — Telehealth: Payer: Self-pay | Admitting: Adult Health

## 2016-01-03 NOTE — Telephone Encounter (Signed)
Notes Recorded by Virl Cagey, CMA on 01/03/2016 at 11:50 AM LM for pt x 1 Notes Recorded by Melvenia Needles, NP on 01/02/2016 at 5:04 PM ESR is nml and CHF marker is ok  Cont w/ ov recs Please contact office for sooner follow up if symptoms do not improve or worsen or seek emergency care    Pt is aware of results and no further questions or concerns at this time.

## 2016-01-07 ENCOUNTER — Telehealth: Payer: Self-pay | Admitting: Internal Medicine

## 2016-01-07 MED ORDER — PROMETHAZINE-CODEINE 6.25-10 MG/5ML PO SYRP: 5.0000 mL | ORAL_SOLUTION | Freq: Four times a day (QID) | ORAL | Status: DC | PRN

## 2016-01-07 NOTE — Telephone Encounter (Signed)
Spoke with pt's daughter Jocelyn Shelton  She states that tessalon pearles are not covered by her ins  She has medicaid, and they do not cover any anti-tussive meds  She has tried generic delsym with no relief TP-please advise, maybe ultram??  Thanks!  Allergies  Allergen Reactions  . Ticagrelor Shortness Of Breath  . Aspirin Other (See Comments)    Stomach cramps  . Cymbalta [Duloxetine Hcl] Nausea Only    No appetite, stomach pain  . Lisinopril Cough  . Tomato Rash

## 2016-01-07 NOTE — Telephone Encounter (Signed)
Rx printed and faxed to [facility] April aware and nothing further needed

## 2016-01-07 NOTE — Telephone Encounter (Signed)
I can do phenergan with codeine or hydromet which is generic if they would like

## 2016-01-07 NOTE — Telephone Encounter (Signed)
Phenergan with codeine #8 oz 1 tsp every 6hr As needed  Cough , may make you sleepy , no refills.  Please contact office for sooner follow up if symptoms do not improve or worsen or seek emergency care

## 2016-01-07 NOTE — Telephone Encounter (Signed)
OK- daughter states we can try calling in codiene syrup  We can not do the hydromet, b/c she wants this called in and we can not call this one over the phone  Please advise quan and directions for phenergan with codeine, thanks

## 2016-01-23 ENCOUNTER — Other Ambulatory Visit: Payer: Self-pay | Admitting: Physician Assistant

## 2016-03-06 ENCOUNTER — Ambulatory Visit (INDEPENDENT_AMBULATORY_CARE_PROVIDER_SITE_OTHER): Payer: Medicaid Other | Admitting: Internal Medicine

## 2016-03-06 VITALS — BP 112/80 | HR 68

## 2016-03-06 DIAGNOSIS — J449 Chronic obstructive pulmonary disease, unspecified: Secondary | ICD-10-CM

## 2016-03-06 DIAGNOSIS — R05 Cough: Secondary | ICD-10-CM

## 2016-03-06 DIAGNOSIS — F17210 Nicotine dependence, cigarettes, uncomplicated: Secondary | ICD-10-CM

## 2016-03-06 DIAGNOSIS — R058 Other specified cough: Secondary | ICD-10-CM

## 2016-03-06 DIAGNOSIS — B029 Zoster without complications: Secondary | ICD-10-CM

## 2016-03-06 DIAGNOSIS — Z720 Tobacco use: Secondary | ICD-10-CM | POA: Diagnosis not present

## 2016-03-06 DIAGNOSIS — M349 Systemic sclerosis, unspecified: Secondary | ICD-10-CM | POA: Diagnosis not present

## 2016-03-06 DIAGNOSIS — R059 Cough, unspecified: Secondary | ICD-10-CM

## 2016-03-06 LAB — PULMONARY FUNCTION TEST
DL/VA % pred: 60 %
DL/VA: 3.04 ml/min/mmHg/L
DLCO unc % pred: 33 %
DLCO unc: 9.09 ml/min/mmHg
FEF 25-75 Post: 0.95 L/sec
FEF 25-75 Pre: 0.99 L/sec
FEF2575-%Change-Post: -4 %
FEF2575-%PRED-PRE: 45 %
FEF2575-%Pred-Post: 43 %
FEV1-%Change-Post: 0 %
FEV1-%PRED-POST: 54 %
FEV1-%PRED-PRE: 54 %
FEV1-POST: 1.23 L
FEV1-PRE: 1.23 L
FEV1FVC-%CHANGE-POST: 8 %
FEV1FVC-%Pred-Pre: 93 %
FEV6-%CHANGE-POST: -7 %
FEV6-%PRED-POST: 55 %
FEV6-%PRED-PRE: 59 %
FEV6-PRE: 1.66 L
FEV6-Post: 1.53 L
FEV6FVC-%PRED-PRE: 103 %
FEV6FVC-%Pred-Post: 103 %
FVC-%Change-Post: -8 %
FVC-%Pred-Post: 53 %
FVC-%Pred-Pre: 58 %
FVC-Post: 1.53 L
FVC-Pre: 1.66 L
POST FEV6/FVC RATIO: 100 %
Post FEV1/FVC ratio: 81 %
Pre FEV1/FVC ratio: 74 %
Pre FEV6/FVC Ratio: 100 %
RV % PRED: 113 %
RV: 2.39 L
TLC % PRED: 75 %
TLC: 4.04 L

## 2016-03-06 MED ORDER — VALACYCLOVIR HCL 1 G PO TABS
1000.0000 mg | ORAL_TABLET | Freq: Three times a day (TID) | ORAL | 0 refills | Status: DC
Start: 2016-03-06 — End: 2016-07-01

## 2016-03-06 MED ORDER — VALACYCLOVIR HCL 1 G PO TABS: 1000.0000 mg | ORAL_TABLET | Freq: Two times a day (BID) | ORAL | 0 refills | Status: DC

## 2016-03-06 NOTE — Progress Notes (Signed)
Subjective:    Patient ID: Jocelyn Shelton, female    DOB: 28-Feb-1956,    MRN: [id number]   Brief patient profile:  66 yobf smoker with RA/scleroderma since 1986 with onset of cough > sob x 1990s  referred to pulmonary clinic 08/29/2015 by Dr Tenny Craw  History of Present Illness  08/29/2015 1st [facility] office visit/ [name]   Chief Complaint  Patient presents with  . Advice Only    refer Dr. Tenny Craw. C/o mostly dry cough (at times coughs up yellow phlem), wheezing occas, nasal cong, PND. Cough going on x oct 2016.  cough x years but worse since admit 04/2015 Takes prn prednisone for joints no change in resp symptoms on vs off On atovent bid but hfa 0% Taking ppi hs only  Doe = MMRC2 = can't walk a nl pace on a flat grade s sob >>change atrovent to Four times a day      01/02/2016 NP Acute OV  Pt returns for 4 month follow up .  Says her cough is not that much better Feels cough is worse for last 3-4 months .  Pt getting up white mucus at times and vomits with heavy cough. No change in SOB since last OV.  Seen in the ED 12/26/2015 for cough spasm -  Tessalon perles not working. CT chest 11/25/15 was neg for PE, stable mild ILD changes.   Notes some cramping under right breast from coughing. Pt has RA and Scleroderma on Methotrexate  Remains on Dulera .   Still smoking , discussed cessation  rec Prednisone taper over next week.  Delsym 2 tsp Twice daily  As needed  Cough.  Tessalon Three times a day  As needed  Cough .  Work on not smoking        03/06/2016  f/u ov/[name] re:   PF  / still smoking  Chief Complaint  Patient presents with  . Follow-up    PFT's done today. Pt states cough has not improved. Sometimes coughs until she is vomiting. She c/o blistering, painful rash on her right arm- started 3 days ago.   classic shingles rash x 3 days R C-8 distribution   Very sedentary with doe = MMRC3 = can't walk 100 yards even at a slow pace at a flat grade s stopping due to sob     No obvious day to day or daytime variability or assoc excess/ purulent sputum or mucus plugs or hemoptysis or cp or chest tightness, subjective wheeze or overt sinus or hb symptoms. No unusual exp hx or h/o childhood pna/ asthma or knowledge of premature birth.  Sleeping ok without nocturnal  or early am exacerbation  of respiratory  c/o's or need for noct saba. Also denies any obvious fluctuation of symptoms with weather or environmental changes or other aggravating or alleviating factors except as outlined above   Current Medications, Allergies, Complete Past Medical History, Past Surgical History, Family History, and Social History were reviewed in [employer] record.  ROS  The following are not active complaints unless bolded sore throat, dysphagia, dental problems, itching, sneezing,  nasal congestion or excess/ purulent secretions, ear ache,   fever, chills, sweats, unintended wt loss, classically pleuritic or exertional cp,  orthopnea pnd or leg swelling, presyncope, palpitations, abdominal pain, anorexia, nausea, vomiting, diarrhea  or change in bowel or bladder habits, change in stools or urine, dysuria,hematuria,  rash, arthralgias, visual complaints, headache, numbness, weakness or ataxia or problems with walking  or coordination,  change in mood/affect or memory.                            Objective:   Physical Exam  amb bf nad   Wt Readings from Last 3 Encounters:  01/02/16 146 lb 6.4 oz (66.4 kg)  12/16/15 144 lb 3.2 oz (65.4 kg)  08/29/15 151 lb 12.8 oz (68.9 kg)    Vital signs reviewed    HEENT: nl dentition, turbinates, and oropharynx. Nl external ear canals without cough reflex   NECK :  without JVD/Nodes/TM/ nl carotid upstrokes bilaterally   LUNGS: no acc muscle use,  Nl contour chest which is clear to A and P bilaterally without cough on insp or exp maneuvers   CV:  RRR  no s3 or murmur or increase in P2, no edema   ABD:  soft  and nontender with nl inspiratory excursion in the supine position. No bruits or organomegaly, bowel sounds nl  MS:  Nl gait/ ext warm without deformities, calf tenderness, cyanosis or clubbing No obvious joint restrictions   SKIN: warm and dry with classic severe zoster C-8 on R   NEURO:  alert, approp, nl sensorium with  no motor deficits      CT Chest   07/31/15 1. Bibasilar subpleural interstitial prominence, slightly greater on the left. This is similar to the 04/27/2011, and suspicious for interstitial lung disease. Favor usual interstitial pneumonitis pattern, as can be seen with collagen vascular disease (such is in this patient) or with idiopathic pulmonary fibrosis. 2. No pulmonary nodule or mass. 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 3. Resolution of bilateral pleural effusions since 02/09/2015. 4. Dilated esophagus with fluid level within. Likely related to the clinical history of scleroderma.           Assessment & Plan:

## 2016-03-06 NOTE — Patient Instructions (Addendum)
valacyclovir 1000 mg three times daily x 7 days then see your primary doctor for follow up of your shingles  Stop dulera and just use the atovent/ proventil up to 4x daily as needed  The key is to stop smoking completely before smoking completely stops you - it's not too late   Follow up is as needed here for worse breathing

## 2016-03-06 NOTE — Progress Notes (Signed)
PFT performed today. 

## 2016-03-08 ENCOUNTER — Emergency Department (HOSPITAL_COMMUNITY): Payer: Medicaid Other

## 2016-03-08 ENCOUNTER — Encounter (HOSPITAL_COMMUNITY): Payer: Self-pay | Admitting: *Deleted

## 2016-03-08 ENCOUNTER — Inpatient Hospital Stay (HOSPITAL_COMMUNITY)
Admission: EM | Admit: 2016-03-08 | Discharge: 2016-03-13 | DRG: 312 | Disposition: A | Discharge status: Home / Self Care | Payer: Medicaid Other | Attending: Family Medicine | Admitting: Family Medicine

## 2016-03-08 ENCOUNTER — Encounter: Payer: Self-pay | Admitting: Internal Medicine

## 2016-03-08 DIAGNOSIS — R509 Fever, unspecified: Secondary | ICD-10-CM

## 2016-03-08 DIAGNOSIS — D899 Disorder involving the immune mechanism, unspecified: Secondary | ICD-10-CM

## 2016-03-08 DIAGNOSIS — E871 Hypo-osmolality and hyponatremia: Secondary | ICD-10-CM | POA: Diagnosis not present

## 2016-03-08 DIAGNOSIS — B029 Zoster without complications: Secondary | ICD-10-CM | POA: Insufficient documentation

## 2016-03-08 DIAGNOSIS — M069 Rheumatoid arthritis, unspecified: Secondary | ICD-10-CM | POA: Diagnosis present

## 2016-03-08 DIAGNOSIS — R55 Syncope and collapse: Secondary | ICD-10-CM | POA: Diagnosis not present

## 2016-03-08 DIAGNOSIS — Z7951 Long term (current) use of inhaled steroids: Secondary | ICD-10-CM

## 2016-03-08 DIAGNOSIS — L97519 Non-pressure chronic ulcer of other part of right foot with unspecified severity: Secondary | ICD-10-CM | POA: Diagnosis present

## 2016-03-08 DIAGNOSIS — I272 Other secondary pulmonary hypertension: Secondary | ICD-10-CM | POA: Diagnosis present

## 2016-03-08 DIAGNOSIS — D849 Immunodeficiency, unspecified: Secondary | ICD-10-CM

## 2016-03-08 DIAGNOSIS — K219 Gastro-esophageal reflux disease without esophagitis: Secondary | ICD-10-CM | POA: Diagnosis present

## 2016-03-08 DIAGNOSIS — E860 Dehydration: Secondary | ICD-10-CM | POA: Diagnosis present

## 2016-03-08 DIAGNOSIS — I252 Old myocardial infarction: Secondary | ICD-10-CM

## 2016-03-08 DIAGNOSIS — M349 Systemic sclerosis, unspecified: Secondary | ICD-10-CM | POA: Diagnosis present

## 2016-03-08 DIAGNOSIS — I10 Essential (primary) hypertension: Secondary | ICD-10-CM | POA: Diagnosis present

## 2016-03-08 DIAGNOSIS — I2510 Atherosclerotic heart disease of native coronary artery without angina pectoris: Secondary | ICD-10-CM | POA: Diagnosis present

## 2016-03-08 DIAGNOSIS — J449 Chronic obstructive pulmonary disease, unspecified: Secondary | ICD-10-CM | POA: Diagnosis not present

## 2016-03-08 DIAGNOSIS — F17210 Nicotine dependence, cigarettes, uncomplicated: Secondary | ICD-10-CM | POA: Diagnosis present

## 2016-03-08 DIAGNOSIS — Z955 Presence of coronary angioplasty implant and graft: Secondary | ICD-10-CM

## 2016-03-08 DIAGNOSIS — Z7982 Long term (current) use of aspirin: Secondary | ICD-10-CM

## 2016-03-08 LAB — BASIC METABOLIC PANEL
ANION GAP: 9 (ref 5–15)
Anion gap: 12 (ref 5–15)
BUN: 8 mg/dL (ref 6–20)
BUN: 8 mg/dL (ref 6–20)
CALCIUM: 8.6 mg/dL — AB (ref 8.9–10.3)
CHLORIDE: 91 mmol/L — AB (ref 101–111)
CO2: 22 mmol/L (ref 22–32)
CO2: 22 mmol/L (ref 22–32)
Calcium: 8.5 mg/dL — ABNORMAL LOW (ref 8.9–10.3)
Chloride: 91 mmol/L — ABNORMAL LOW (ref 101–111)
Creatinine, Ser: 1 mg/dL (ref 0.44–1.00)
Creatinine, Ser: 1.07 mg/dL — ABNORMAL HIGH (ref 0.44–1.00)
GFR calc Af Amer: >60 mL/min (ref >60)
GFR calc non Af Amer: 60 mL/min — ABNORMAL LOW (ref >60)
GFR, EST NON AFRICAN AMERICAN: 55 mL/min — AB (ref >60)
Glucose, Bld: 89 mg/dL (ref 65–99)
Glucose, Bld: 94 mg/dL (ref 65–99)
POTASSIUM: 3.6 mmol/L (ref 3.5–5.1)
POTASSIUM: 3.7 mmol/L (ref 3.5–5.1)
SODIUM: 125 mmol/L — AB (ref 135–145)
Sodium: 122 mmol/L — ABNORMAL LOW (ref 135–145)

## 2016-03-08 LAB — TROPONIN I

## 2016-03-08 LAB — RAPID URINE DRUG SCREEN, HOSP PERFORMED
Amphetamines: NOT DETECTED
BENZODIAZEPINES: NOT DETECTED
Barbiturates: NOT DETECTED
COCAINE: NOT DETECTED
OPIATES: NOT DETECTED
Tetrahydrocannabinol: NOT DETECTED

## 2016-03-08 LAB — CBC WITH DIFFERENTIAL/PLATELET
BASOS ABS: 0 10*3/uL (ref 0.0–0.1)
BASOS PCT: 0 %
EOS PCT: 0 %
Eosinophils Absolute: 0 10*3/uL (ref 0.0–0.7)
HEMATOCRIT: 32.5 % — AB (ref 36.0–46.0)
Hemoglobin: 11 g/dL — ABNORMAL LOW (ref 12.0–15.0)
Lymphocytes Relative: 9 %
Lymphs Abs: 0.6 10*3/uL — ABNORMAL LOW (ref 0.7–4.0)
MCH: 34.2 pg — ABNORMAL HIGH (ref 26.0–34.0)
MCHC: 33.8 g/dL (ref 30.0–36.0)
MCV: 100.9 fL — ABNORMAL HIGH (ref 78.0–100.0)
MONO ABS: 0.5 10*3/uL (ref 0.1–1.0)
Monocytes Relative: 7 %
NEUTROS ABS: 6 10*3/uL (ref 1.7–7.7)
Neutrophils Relative %: 84 %
PLATELETS: 162 10*3/uL (ref 150–400)
RBC: 3.22 MIL/uL — AB (ref 3.87–5.11)
RDW: 13.4 % (ref 11.5–15.5)
WBC: 7.2 10*3/uL (ref 4.0–10.5)

## 2016-03-08 LAB — GLUCOSE, CAPILLARY
Glucose-Capillary: 61 mg/dL — ABNORMAL LOW (ref 65–99)
Glucose-Capillary: 101 mg/dL — ABNORMAL HIGH (ref 65–99)

## 2016-03-08 LAB — URINALYSIS, ROUTINE W REFLEX MICROSCOPIC
Bilirubin Urine: NEGATIVE
Glucose, UA: NEGATIVE mg/dL
Hgb urine dipstick: NEGATIVE
KETONES UR: NEGATIVE mg/dL
NITRITE: NEGATIVE
PROTEIN: NEGATIVE mg/dL
Specific Gravity, Urine: 1.01 (ref 1.005–1.030)
pH: 6.5 (ref 5.0–8.0)

## 2016-03-08 LAB — URINE MICROSCOPIC-ADD ON

## 2016-03-08 LAB — MAGNESIUM: Magnesium: 1.6 mg/dL — ABNORMAL LOW (ref 1.7–2.4)

## 2016-03-08 LAB — ETHANOL: Alcohol, Ethyl (B): 5 mg/dL — ABNORMAL HIGH (ref <5)

## 2016-03-08 LAB — CBG MONITORING, ED: GLUCOSE-CAPILLARY: 84 mg/dL (ref 65–99)

## 2016-03-08 MED ORDER — METHOTREXATE 2.5 MG PO TABS
10.0000 mg | ORAL_TABLET | ORAL | Status: DC
  Administered 2016-03-09 – 2016-03-10 (×2): 10 mg via ORAL
  Filled 2016-03-08 (×2): qty 4

## 2016-03-08 MED ORDER — OXYCODONE-ACETAMINOPHEN 5-325 MG PO TABS
1.0000 | ORAL_TABLET | ORAL | Status: DC | PRN
  Administered 2016-03-08: 1 via ORAL
  Administered 2016-03-09: 2 via ORAL
  Filled 2016-03-08: qty 1
  Filled 2016-03-08: qty 2

## 2016-03-08 MED ORDER — SULFASALAZINE 500 MG PO TBEC
1000.0000 mg | DELAYED_RELEASE_TABLET | Freq: Two times a day (BID) | ORAL | Status: DC
  Administered 2016-03-08 – 2016-03-13 (×11): 1000 mg via ORAL
  Filled 2016-03-08 (×11): qty 2

## 2016-03-08 MED ORDER — ACETAMINOPHEN 500 MG PO TABS: 1000.0000 mg | ORAL_TABLET | Freq: Every day | ORAL | Status: DC | PRN

## 2016-03-08 MED ORDER — PRASUGREL HCL 10 MG PO TABS
10.0000 mg | ORAL_TABLET | Freq: Every day | ORAL | Status: DC
  Administered 2016-03-08 – 2016-03-13 (×6): 10 mg via ORAL
  Filled 2016-03-08 (×7): qty 1

## 2016-03-08 MED ORDER — ADULT MULTIVITAMIN W/MINERALS CH
1.0000 | ORAL_TABLET | Freq: Every day | ORAL | Status: DC
  Administered 2016-03-08 – 2016-03-13 (×6): 1 via ORAL
  Filled 2016-03-08 (×6): qty 1

## 2016-03-08 MED ORDER — MAGNESIUM SULFATE 2 GM/50ML IV SOLN
2.0000 g | Freq: Once | INTRAVENOUS | Status: AC
  Administered 2016-03-08: 2 g via INTRAVENOUS
  Filled 2016-03-08: qty 50

## 2016-03-08 MED ORDER — DORZOLAMIDE HCL-TIMOLOL MAL 2-0.5 % OP SOLN
1.0000 [drp] | Freq: Two times a day (BID) | OPHTHALMIC | Status: DC
  Administered 2016-03-08 – 2016-03-13 (×11): 1 [drp] via OPHTHALMIC
  Filled 2016-03-08: qty 10

## 2016-03-08 MED ORDER — DEXTROSE 5 % IV SOLN
10.0000 mg/kg | Freq: Three times a day (TID) | INTRAVENOUS | Status: DC
  Administered 2016-03-08 – 2016-03-11 (×8): 660 mg via INTRAVENOUS
  Filled 2016-03-08 (×10): qty 13.2

## 2016-03-08 MED ORDER — OLANZAPINE 5 MG PO TABS
5.0000 mg | ORAL_TABLET | Freq: Every day | ORAL | Status: DC
  Administered 2016-03-08 – 2016-03-12 (×5): 5 mg via ORAL
  Filled 2016-03-08 (×5): qty 1

## 2016-03-08 MED ORDER — SODIUM CHLORIDE 0.9% FLUSH
3.0000 mL | Freq: Two times a day (BID) | INTRAVENOUS | Status: DC
  Administered 2016-03-08 – 2016-03-13 (×9): 3 mL via INTRAVENOUS

## 2016-03-08 MED ORDER — SODIUM CHLORIDE 0.9 % IV SOLN
INTRAVENOUS | Status: DC
  Administered 2016-03-08: 04:00:00 via INTRAVENOUS

## 2016-03-08 MED ORDER — VALACYCLOVIR HCL 500 MG PO TABS
1000.0000 mg | ORAL_TABLET | Freq: Three times a day (TID) | ORAL | Status: DC
  Administered 2016-03-08: 1000 mg via ORAL
  Filled 2016-03-08: qty 2

## 2016-03-08 MED ORDER — PANTOPRAZOLE SODIUM 40 MG PO TBEC
80.0000 mg | DELAYED_RELEASE_TABLET | Freq: Every day | ORAL | Status: DC
  Administered 2016-03-08 – 2016-03-13 (×6): 80 mg via ORAL
  Filled 2016-03-08 (×6): qty 2

## 2016-03-08 MED ORDER — METHOTREXATE 2.5 MG PO TABS: 12.5000 mg | ORAL_TABLET | ORAL | Status: DC

## 2016-03-08 MED ORDER — ALBUTEROL SULFATE (2.5 MG/3ML) 0.083% IN NEBU: 3.0000 mL | INHALATION_SOLUTION | RESPIRATORY_TRACT | Status: DC | PRN

## 2016-03-08 MED ORDER — SODIUM CHLORIDE 0.9 % IV BOLUS (SEPSIS)
1000.0000 mL | Freq: Once | INTRAVENOUS | Status: AC
  Administered 2016-03-08: 1000 mL via INTRAVENOUS

## 2016-03-08 MED ORDER — ACETAMINOPHEN 500 MG PO TABS
1000.0000 mg | ORAL_TABLET | Freq: Once | ORAL | Status: AC
  Administered 2016-03-08: 1000 mg via ORAL
  Filled 2016-03-08: qty 2

## 2016-03-08 MED ORDER — ENOXAPARIN SODIUM 40 MG/0.4ML ~~LOC~~ SOLN
40.0000 mg | SUBCUTANEOUS | Status: DC
  Administered 2016-03-08 – 2016-03-12 (×5): 40 mg via SUBCUTANEOUS
  Filled 2016-03-08 (×5): qty 0.4

## 2016-03-08 MED ORDER — ACYCLOVIR SODIUM 50 MG/ML IV SOLN
10.0000 mg/kg | Freq: Three times a day (TID) | INTRAVENOUS | Status: DC
  Filled 2016-03-08 (×3): qty 13.2

## 2016-03-08 MED ORDER — ENSURE ENLIVE PO LIQD
237.0000 mL | Freq: Two times a day (BID) | ORAL | Status: DC
  Administered 2016-03-08 – 2016-03-09 (×2): 237 mL via ORAL

## 2016-03-08 MED ORDER — IPRATROPIUM BROMIDE 0.02 % IN SOLN: 0.5000 mg | Freq: Four times a day (QID) | RESPIRATORY_TRACT | Status: DC | PRN

## 2016-03-08 MED ORDER — SODIUM CHLORIDE 0.9 % IV BOLUS (SEPSIS)
1000.0000 mL | Freq: Once | INTRAVENOUS | Status: AC
Start: 2016-03-08 — End: 2016-03-08
  Administered 2016-03-08: 1000 mL via INTRAVENOUS

## 2016-03-08 MED ORDER — GABAPENTIN 100 MG PO CAPS
100.0000 mg | ORAL_CAPSULE | Freq: Two times a day (BID) | ORAL | Status: DC
  Administered 2016-03-08 – 2016-03-13 (×11): 100 mg via ORAL
  Filled 2016-03-08 (×12): qty 1

## 2016-03-08 MED ORDER — ASPIRIN EC 81 MG PO TBEC
81.0000 mg | DELAYED_RELEASE_TABLET | Freq: Every day | ORAL | Status: DC
  Administered 2016-03-08 – 2016-03-13 (×6): 81 mg via ORAL
  Filled 2016-03-08 (×6): qty 1

## 2016-03-08 MED ORDER — METOPROLOL TARTRATE 50 MG PO TABS
50.0000 mg | ORAL_TABLET | Freq: Two times a day (BID) | ORAL | Status: DC
  Administered 2016-03-08 – 2016-03-13 (×11): 50 mg via ORAL
  Filled 2016-03-08 (×10): qty 1
  Filled 2016-03-08: qty 2

## 2016-03-08 MED ORDER — ATORVASTATIN CALCIUM 40 MG PO TABS
40.0000 mg | ORAL_TABLET | Freq: Every day | ORAL | Status: DC
  Administered 2016-03-08 – 2016-03-13 (×6): 40 mg via ORAL
  Filled 2016-03-08 (×7): qty 1

## 2016-03-08 NOTE — ED Notes (Signed)
Pt ambulated with  Steady gait, sats remained 98-100%

## 2016-03-08 NOTE — Assessment & Plan Note (Addendum)
08/29/2015  extensive coaching HFA effectiveness =    50% from a baseline of 0> change atrovent to 2 qid  On methotrexate -01/02/2016 ESR nml , BNP 160.  - PFT's  03/06/2016  FEV1 1.23 (54 % ) ratio 81  p no % improvement from saba p nothing prior to study with DLCO  33 % corrects to 60  % for alv volume   - 03/06/2016 rec d/c dulera and just use sama/saba hfa prn   I had an extended discussion with the patient reviewing all relevant studies completed to date and  lasting 25  minutes of a 40 minute visit  Addressing multiple chronic unresolved issues plus acute management of a new one = HZ  Each maintenance medication was reviewed in detail including most importantly the difference between maintenance and prns and under what circumstances the prns are to be triggered using an action plan format that is not reflected in the computer generated alphabetically organized AVS.    Please see instructions for details which were reviewed in writing and the patient given a copy highlighting the part that I personally wrote and discussed at today's ov.

## 2016-03-08 NOTE — ED Notes (Signed)
Taken to xray at this time. 

## 2016-03-08 NOTE — Progress Notes (Signed)
PROGRESS NOTE  Jocelyn Shelton IAX:[id number] DOB: 1956/07/08 DOA: 03/08/2016 PCP: Wendee Beavers, DO  HPI/Recap of past 24 hours:  Persistent right arm and right shoulder pain  Assessment/Plan: Principal Problem:   Syncope Active Problems:   Rheumatoid arthritis (HCC)   CAD in native artery   COPD GOLD 0 still smoking   Scleroderma (HCC)   Hyponatremia  Syncope: likely from dehydration, echo pending, ekg unremarkable, continue tele, continue hydration. Will check orthostatic vital signs, does has chronic cough, no hypoxia, no chest pain, no tachycardia, low suspicion for PE,  Patient is also on zyprexa which could cause syncope though she report she has  Been on it since the 90's May need outpatient cardiac monitor  cutaneous shingles : likely cross two different dermatomes, one patch on upper arm , medial side close to axilla that is crusted, another patch with free blisters on anterior proximal forearm close to elbow, no open blister seen. She was started on valtrex two days ago, she reported symptom started a week prior to starting treatment, will switch to high dose acyclovir IV in this immunosuppressed individual with singles cross more than one dermatomes.   Fever: likely from singles, ua unremarkable, blood culture pending, cxr with chronic changes, she does has chronic cough, will close monitor fever curves and f/u on culture result  Acute on chronic Hyponatremia:  likely combination of dehydration and the use of zyprexa, review chart hyponatremia started lat 2016,  No confusion,  Continue hydration  Hypomagnesemia: replace mag  H/o scleroderma/RA : continue home meds MTX, sulfasalazine   Htn/cad s/p stent, no chest pain, continue home meds lopressor, asa, effient, statin  Chronic cough, closely followed by pulmonology, she just saw her pulmonologist Dr Sherene Sires on 8/11. Continue home meds   Code Status: full  Family Communication: patient   Disposition Plan:  pending   Consultants:  none  Procedures:  none  Antibiotics:  none   Objective: BP 136/74 (BP Location: Right Arm)   Pulse 77   Temp 99.2 F (37.3 C) (Oral)   Resp 18   Ht 5\' 6"  (1.676 m)   Wt 65.9 kg (145 lb 3.2 oz) Comment: scale c  SpO2 100%   BMI 23.44 kg/m   Intake/Output Summary (Last 24 hours) at 03/08/16 1441 Last data filed at 03/08/16 1300  Gross per 24 hour  Intake             2000 ml  Output              450 ml  Net             1550 ml   Filed Weights   03/08/16 0026 03/08/16 1106  Weight: 65.3 kg (144 lb) 65.9 kg (145 lb 3.2 oz)    Exam:   General:  NAD   Cardiovascular: RRR  Respiratory: CTABL  Abdomen: Soft/ND/NT, positive BS  Musculoskeletal: No Edema  Neuro: aaox3  Skin: one patch on upper arm , medial side close to axilla that is crusted, another patch with free blisters on anterior proximal forearm close to elbow, no open blister seen.   Data Reviewed: Basic Metabolic Panel:  Recent Labs Lab 03/08/16 0025 03/08/16 0244  NA 122* 125*  K 3.6 3.7  CL 91* 91*  CO2 22 22  GLUCOSE 94 89  BUN 8 8  CREATININE 1.07* 1.00  CALCIUM 8.6* 8.5*  MG 1.6*  --    Liver Function Tests: No results for input(s): AST, ALT,  ALKPHOS, BILITOT, PROT, ALBUMIN in the last 168 hours. No results for input(s): LIPASE, AMYLASE in the last 168 hours. No results for input(s): AMMONIA in the last 168 hours. CBC:  Recent Labs Lab 03/08/16 0025  WBC 7.2  NEUTROABS 6.0  HGB 11.0*  HCT 32.5*  MCV 100.9*  PLT 162   Cardiac Enzymes:    Recent Labs Lab 03/08/16 0440  TROPONINI <0.03   BNP (last 3 results)  Recent Labs  05/22/15 0040 05/23/15 1000  BNP 175.4* 363.8*    ProBNP (last 3 results)  Recent Labs  01/02/16 1619  PROBNP 160.0*    CBG:  Recent Labs Lab 03/08/16 0928 03/08/16 1129  GLUCAP 84 61*    No results found for this or any previous visit (from the past 240 hour(s)).   Studies: Dg Chest 2  View  Result Date: 03/08/2016 CLINICAL DATA:  Syncope a tonight.  Smoker.  Hypertension. EXAM: CHEST  2 VIEW COMPARISON:  Chest radiographs and chest CTA dated 12/26/2015. FINDINGS: Normal sized heart. Bilateral nipple shadows. Slightly less prominent patchy density at the left lung base. No significant change in a small amount of patchy and linear density at the right lung base. No acute bony abnormality. IMPRESSION: 1. Minimally improved patchy interstitial disease in the left lower lobe. 2. Stable mild linear scarring and patchy interstitial disease in the right lower lobe. 3. No acute abnormality. Electronically Signed   By: Beckie Salts M.D.   On: 03/08/2016 02:05    Scheduled Meds: . acyclovir  10 mg/kg Intravenous Q8H  . aspirin EC  81 mg Oral Daily  . atorvastatin  40 mg Oral Daily  . dorzolamide-timolol  1 drop Both Eyes BID  . enoxaparin (LOVENOX) injection  40 mg Subcutaneous Q24H  . feeding supplement (ENSURE ENLIVE)  237 mL Oral BID BM  . gabapentin  100 mg Oral BID  . [START ON 03/09/2016] methotrexate  10 mg Oral Once per day on Mon Tue  . metoprolol  50 mg Oral BID  . multivitamin with minerals  1 tablet Oral Daily  . OLANZapine  5 mg Oral QHS  . pantoprazole  80 mg Oral Daily  . prasugrel  10 mg Oral Daily  . sodium chloride flush  3 mL Intravenous Q12H  . sulfaSALAzine  1,000 mg Oral BID    Continuous Infusions: . sodium chloride 75 mL/hr at 03/08/16 0330     Time spent:  From 2pm to 2:355pm  Phoebie Shad MD, PhD  [facility] Pager [phone]. If 7PM-7AM, please contact night-coverage at [email or web address], password [facility] 03/08/2016, 2:41 PM  LOS: 0 days

## 2016-03-08 NOTE — Assessment & Plan Note (Addendum)
Cough is not occurring with insp and likely therefore not related to ILD nor with exp so not likely related to airways dz.    Lack of cough resolution on a verified empirical regimen could mean an alternative diagnosis, persistence of the disease state (eg sinusitis or bronchiectasis) , or inadequacy of currently available therapy (eg no medical rx available for non-acid gerd) - I favor the last explanation and would consider GI next and stop dulera in meantime and just use the sama/saba hfa prn

## 2016-03-08 NOTE — H&P (Signed)
History and Physical    Jocelyn Shelton QQP:[id number] DOB: 15-Feb-1956 DOA: 03/08/2016   PCP: Wendee Beavers, DO Chief Complaint:  Chief Complaint  Patient presents with  . Near Syncope    HPI: Jocelyn Shelton is a 60 y.o. 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  Patient presents to the ED after an episode of syncope.  Episode occurred earlier this evening after she was done using the bathroom and stood up off of the toilet.  No vomiting or diarrhea.  EMS was called and syncopal episode lasted 5 mins per patient.  ED Course: EKG unchanged, lab work shows mild hyponatremia, mild hypomagnesemia.  Patient denies any chest pain recently or with this episode.  Says she is short of breath with exertion but no more recently than usual.  Review of Systems: As per HPI otherwise 10 point review of systems negative.    Past Medical History:  Diagnosis Date  . CAD in native artery 05/23/2015  . Hypertension   . Reflux   . Rheumatoid arthritis(714.0)   . S/P angioplasty with stent, 05/22/15 pRCA DES and pLCX DES 05/23/2015  . Scleroderma [facility])     Past Surgical History:  Procedure Laterality Date  . APPENDECTOMY  90's  . CARDIAC CATHETERIZATION N/A 05/22/2015   Procedure: Left Heart Cath and Coronary Angiography;  Surgeon: Lennette Bihari, MD;  Location: [facility] INVASIVE CV LAB;  Service: Cardiovascular;  Laterality: N/A;  . CARDIAC CATHETERIZATION N/A 05/22/2015   Procedure: Coronary Stent Intervention;  Surgeon: Lennette Bihari, MD;  Location: [facility] INVASIVE CV LAB;  Service: Cardiovascular;  Laterality: N/A;     reports that she has been smoking Cigarettes.  She has a 20.00 pack-year smoking history. She has never used smokeless tobacco. She reports that she drinks about 2.4 oz of alcohol per week . She reports that she does not use drugs.  Allergies  Allergen Reactions  .  Ticagrelor Shortness Of Breath  . Aspirin Other (See Comments)    Stomach cramps  . Cymbalta [Duloxetine Hcl] Nausea Only    No appetite, stomach pain  . Lisinopril Cough  . Tomato Rash    Family History  Problem Relation Age of Onset  . Hypertension Father   . Heart attack Mother   . Diabetes Daughter   . Diabetes Sister   . Lung cancer Brother 50      Prior to Admission medications   Medication Sig Start Date End Date Taking? Authorizing Provider  acetaminophen (TYLENOL) 500 MG tablet Take 1,000 mg by mouth daily as needed for pain.     Historical Provider, MD  albuterol (PROVENTIL HFA;VENTOLIN HFA) 108 (90 Base) MCG/ACT inhaler Inhale 2 puffs into the lungs every 4 (four) hours as needed for wheezing or shortness of breath (cough). 12/16/15   Smitty Cords, DO  aspirin EC 81 MG EC tablet Take 1 tablet (81 mg total) by mouth daily. 05/24/15   Rhonda G Barrett, PA-C  atorvastatin (LIPITOR) 40 MG tablet Take 40 mg by mouth daily.    Historical Provider, MD  ATROVENT HFA 17 MCG/ACT inhaler INHALE TWO PUFFS BY MOUTH ONCE DAILY AS NEEDED FOR WHEEZING 11/25/15   Smitty Cords, DO  Black Cohosh 40 MG CAPS Take by mouth daily.    Historical Provider, MD  dorzolamide-timolol (COSOPT) 22.3-6.8 MG/ML ophthalmic solution Place 1 drop into both  eyes 2 (two) times daily.    Historical Provider, MD  EFFIENT 10 MG TABS tablet TAKE ONE TABLET BY MOUTH DAILY 06/25/15   Joline Salt Barrett, PA-C  famotidine (PEPCID) 20 MG tablet One at bedtime 08/29/15   Nyoka Cowden, MD  fluticasone [facility]) 50 MCG/ACT nasal spray USE TWO SPRAY(S) IN EACH NOSTRIL DAILY AS NEEDED FOR  RHINITIS 07/25/15   Smitty Cords, DO  folic acid (FOLVITE) 1 MG tablet TAKE 1 TABLET (1 MG TOTAL) BY MOUTH DAILY. 01/22/15   Smitty Cords, DO  methotrexate (RHEUMATREX) 2.5 MG tablet Take 5 tablets (12.5 mg total) by mouth 2 (two) times a week. Take 5 tablets every Mon and Tues. Caution:Chemotherapy.  Protect from light. 02/25/15   Smitty Cords, DO  metoprolol (LOPRESSOR) 50 MG tablet Take 1 tablet (50 mg total) by mouth 2 (two) times daily. 05/24/15   Joline Salt Barrett, PA-C  Multiple Vitamin (MULTIVITAMIN WITH MINERALS) TABS tablet Take 1 tablet by mouth daily.    Historical Provider, MD  nitroGLYCERIN (NITROSTAT) 0.4 MG SL tablet Place 0.4 mg under the tongue every 5 (five) minutes as needed for chest pain (x 3pills daily).    Historical Provider, MD  OLANZapine (ZYPREXA) 5 MG tablet TAKE ONE TABLET BY MOUTH AT BEDTIME 11/25/15   Smitty Cords, DO  omeprazole (PRILOSEC) 40 MG capsule TAKE ONE CAPSULE BY MOUTH ONCE DAILY 11/25/15   Smitty Cords, DO  sulfaSALAzine (AZULFIDINE) 500 MG tablet Take 1,000 mg by mouth 2 (two) times daily.    Historical Provider, MD  valACYclovir (VALTREX) 1000 MG tablet Take 1 tablet (1,000 mg total) by mouth 3 (three) times daily. 03/06/16   Nyoka Cowden, MD    Physical Exam: Vitals:   03/08/16 0026 03/08/16 0100 03/08/16 0224 03/08/16 0300  BP:  132/74  124/75  Pulse:  96    Resp:  19  17  Temp:   100.4 F (38 C)   TempSrc:      SpO2:  100%    Weight: 65.3 kg (144 lb)     Height: 5\' 7"  (1.702 m)         Constitutional: NAD, calm, comfortable Eyes: PERRL, lids and conjunctivae normal ENMT: Mucous membranes are moist. Posterior pharynx clear of any exudate or lesions.Normal dentition.  Neck: normal, supple, no masses, no thyromegaly Respiratory: clear to auscultation bilaterally, no wheezing, no crackles. Normal respiratory effort. No accessory muscle use.  Cardiovascular: Regular rate and rhythm, no murmurs / rubs / gallops. No extremity edema. 2+ pedal pulses. No carotid bruits.  Abdomen: no tenderness, no masses palpated. No hepatosplenomegaly. Bowel sounds positive.  Musculoskeletal: no clubbing / cyanosis. No joint deformity upper and lower extremities. Good ROM, no contractures. Normal muscle tone.  Skin: no rashes,  lesions, ulcers. No induration Neurologic: CN 2-12 grossly intact. Sensation intact, DTR normal. Strength 5/5 in all 4.  Psychiatric: Normal judgment and insight. Alert and oriented x 3. Normal mood.    Labs on Admission: I have personally reviewed following labs and imaging studies  CBC:  Recent Labs Lab 03/08/16 0025  WBC 7.2  NEUTROABS 6.0  HGB 11.0*  HCT 32.5*  MCV 100.9*  PLT 162   Basic Metabolic Panel:  Recent Labs Lab 03/08/16 0025 03/08/16 0244  NA 122* 125*  K 3.6 3.7  CL 91* 91*  CO2 22 22  GLUCOSE 94 89  BUN 8 8  CREATININE 1.07* 1.00  CALCIUM 8.6* 8.5*  MG 1.6*  --  GFR: Estimated Creatinine Clearance: 57.5 mL/min (by C-G formula based on SCr of 1 mg/dL). Liver Function Tests: No results for input(s): AST, ALT, ALKPHOS, BILITOT, PROT, ALBUMIN in the last 168 hours. No results for input(s): LIPASE, AMYLASE in the last 168 hours. No results for input(s): AMMONIA in the last 168 hours. Coagulation Profile: No results for input(s): INR, PROTIME in the last 168 hours. Cardiac Enzymes: No results for input(s): CKTOTAL, CKMB, CKMBINDEX, TROPONINI in the last 168 hours. BNP (last 3 results)  Recent Labs  01/02/16 1619  PROBNP 160.0*   HbA1C: No results for input(s): HGBA1C in the last 72 hours. CBG: No results for input(s): GLUCAP in the last 168 hours. Lipid Profile: No results for input(s): CHOL, HDL, LDLCALC, TRIG, CHOLHDL, LDLDIRECT in the last 72 hours. Thyroid Function Tests: No results for input(s): TSH, T4TOTAL, FREET4, T3FREE, THYROIDAB in the last 72 hours. Anemia Panel: No results for input(s): VITAMINB12, FOLATE, FERRITIN, TIBC, IRON, RETICCTPCT in the last 72 hours. Urine analysis:    Component Value Date/Time   COLORURINE YELLOW 03/08/2016 0224   APPEARANCEUR CLEAR 03/08/2016 0224   LABSPEC 1.010 03/08/2016 0224   PHURINE 6.5 03/08/2016 0224   GLUCOSEU NEGATIVE 03/08/2016 0224   HGBUR NEGATIVE 03/08/2016 0224   BILIRUBINUR  NEGATIVE 03/08/2016 0224   KETONESUR NEGATIVE 03/08/2016 0224   PROTEINUR NEGATIVE 03/08/2016 0224   UROBILINOGEN 1.0 05/22/2015 0227   NITRITE NEGATIVE 03/08/2016 0224   LEUKOCYTESUR SMALL (A) 03/08/2016 0224   Sepsis Labs: @LABRCNTIP (procalcitonin:4,lacticidven:4) )No results found for this or any previous visit (from the past 240 hour(s)).   Radiological Exams on Admission: Dg Chest 2 View  Result Date: 03/08/2016 CLINICAL DATA:  Syncope a tonight.  Smoker.  Hypertension. EXAM: CHEST  2 VIEW COMPARISON:  Chest radiographs and chest CTA dated 12/26/2015. FINDINGS: Normal sized heart. Bilateral nipple shadows. Slightly less prominent patchy density at the left lung base. No significant change in a small amount of patchy and linear density at the right lung base. No acute bony abnormality. IMPRESSION: 1. Minimally improved patchy interstitial disease in the left lower lobe. 2. Stable mild linear scarring and patchy interstitial disease in the right lower lobe. 3. No acute abnormality. Electronically Signed   By: Beckie Salts M.D.   On: 03/08/2016 02:05    EKG: Independently reviewed.  Assessment/Plan Principal Problem:   Syncope Active Problems:   Rheumatoid arthritis (HCC)   CAD in native artery   COPD pfts pending    Scleroderma (HCC)    1. Syncope - Although her story sounds more like simple vasovagal syncope, she does have a number of risk factors. 1. Syncope pathway 2. 2d echo - probably a good idea to get one of these anyhow since she hasnt had one since before her MI and stenting last year, also to keep tabs on her pulmonary HTN 3. Tele monitor 2. Hyponatremia - 1. IVF to treat hyponatremia (already showing signs of improvement on repeat labs) 3. CAD - 1. Getting 2d echo to make sure there isnt systolic dysfunction following her MI in October last year 4. RA and Scleroderma - continue home immunosuppressives 5. COPD - see Dr. Thurston Hole note from 2 days ago and results of  PFTs from then 1. Continue home nebs   DVT prophylaxis: Lovenox Code Status: Full Family Communication: No family in room Consults called: None Admission status: Admit to obs   Hillary Bow DO [facility] Pager [phone] from 7PM-7AM  If 7AM-7PM, please contact the day physician  for the patient [email or web address] Password [facility]  03/08/2016, 3:54 AM

## 2016-03-08 NOTE — Assessment & Plan Note (Signed)
C-8 dermatome on R onset 03/03/16 :  Valacyclovir 1000 tid x 7 d 03/06/2016 >>>  F/u PC

## 2016-03-08 NOTE — ED Notes (Signed)
Attempted report 

## 2016-03-08 NOTE — Assessment & Plan Note (Signed)
>   3 min discussion I reviewed the Fletcher curve with the patient that basically indicates  if you quit smoking when your best day FEV1 is still well preserved (as is clearly  the case here)  it is highly unlikely you will progress to severe disease and informed the patient there was  no medication on the market that has proven to alter the curve/ its downward trajectory  or the likelihood of progression of their disease(unlike other chronic medical conditions such as atheroclerosis where we do think we can change the natural hx with risk reducing meds)    Therefore stopping smoking and maintaining abstinence is the most important aspect of care, not choice of inhalers or for that matter, doctors.    Referred back to pc/ pulmonary f/u is prn

## 2016-03-08 NOTE — ED Triage Notes (Signed)
Pt from home by EMS c/o syncopal episode lasting ~9minutes per patient. Reports feeling like blood pressure was high then passed out. Pt lives with daughter. Shingles noted to R forearm with blistering. Reports being on antibiotics for shingles

## 2016-03-08 NOTE — ED Provider Notes (Signed)
[facility]-EMERGENCY DEPT Provider Note   CSN: [id number] Arrival date & time: 03/08/16  0008  First Provider Contact:  First MD Initiated Contact with Patient 03/08/16 0010    By signing my name below, I, Bridgette Habermann, attest that this documentation has been prepared under the direction and in the presence of Tomasita Crumble, MD. Electronically Signed: Bridgette Habermann, ED Scribe. 03/08/16. 12:18 AM.  History   Chief Complaint Chief Complaint  Patient presents with  . Near Syncope   HPI Comments: Jocelyn Shelton is a 60 y.o. female with h/o HTN who presents to the Emergency Department by EMS for a syncopal episode lasting 5 minutes according to pt. She reports feeling like her BP was high prior to her syncopal episode. Pt did not actually check her blood pressure. Her BP was noted to be 110/54 with EMS. Pt has had no recent h/o similar falls. EMS notes she has a cough which she reports is baseline. Shingles noted to right forearm, she is currently on antibiotics. Pt is compliant with her medications. Pt currently lives with her daughter. Denies vomiting and diarrhea.    The history is provided by the patient and the EMS personnel. No language interpreter was used.    Past Medical History:  Diagnosis Date  . CAD in native artery 05/23/2015  . Hypertension   . Reflux   . Rheumatoid arthritis(714.0)   . S/P angioplasty with stent, 05/22/15 pRCA DES and pLCX DES 05/23/2015  . Scleroderma [facility])     Patient Active Problem List   Diagnosis Date Noted  . Upper airway cough syndrome 08/30/2015  . COPD pfts pending  08/30/2015  . CAD in native artery 05/23/2015  . S/P angioplasty with stent, 05/22/15 pRCA DES and pLCX DES 05/23/2015  . Adverse drug reaction 05/23/2015  . NSTEMI (non-ST elevated myocardial infarction) (HCC)   . Hypertensive emergency   . Chest pain   . Hypoxia   . Abdominal pain, acute   . Volume overload   . Pleural effusion 02/08/2015  . Tachycardia   . Allergic rhinitis 12/03/2014   . Tendinitis of right rotator cuff 12/03/2014  . Chronic pain syndrome 12/03/2014  . Diabetes mellitus screening 03/26/2014  . Chronic foot pain 03/26/2014  . Plantar wart of right foot 03/26/2014  . Rheumatoid arthritis (HCC) 03/14/2013  . Anemia 03/02/2013  . Other pancytopenia (HCC) 03/01/2013  . Thrombocytopenia, unspecified (HCC) 03/01/2013  . Facial rash 02/02/2012  . 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 Erythema nodosum 04/27/2011  . GASTROESOPHAGEAL REFLUX DISEASE 08/02/2008  . CORNS AND CALLOSITIES 03/18/2007  . Major depressive disorder, recurrent episode (HCC) 09/23/2006  . Cigarette smoker 09/23/2006  . HYPERTENSION, BENIGN SYSTEMIC 09/23/2006  . RAYNAUDS SYNDROME 09/23/2006  . CLAUDICATION, INTERMITTENT 09/23/2006  . OSTEOARTHRITIS, MULTI SITES 09/23/2006    Past Surgical History:  Procedure Laterality Date  . APPENDECTOMY  90's  . CARDIAC CATHETERIZATION N/A 05/22/2015   Procedure: Left Heart Cath and Coronary Angiography;  Surgeon: Lennette Bihari, MD;  Location: [facility] INVASIVE CV LAB;  Service: Cardiovascular;  Laterality: N/A;  . CARDIAC CATHETERIZATION N/A 05/22/2015   Procedure: Coronary Stent Intervention;  Surgeon: Lennette Bihari, MD;  Location: [facility] INVASIVE CV LAB;  Service: Cardiovascular;  Laterality: N/A;    OB History    No data available       Home Medications    Prior to Admission medications   Medication Sig Start Date End Date Taking? Authorizing Provider  acetaminophen (TYLENOL) 500 MG tablet Take  1,000 mg by mouth daily as needed for pain.     Historical Provider, MD  albuterol (PROVENTIL HFA;VENTOLIN HFA) 108 (90 Base) MCG/ACT inhaler Inhale 2 puffs into the lungs every 4 (four) hours as needed for wheezing or shortness of breath (cough). 12/16/15   Smitty Cords, DO  aspirin EC 81 MG EC tablet Take 1 tablet (81 mg total) by mouth daily. 05/24/15   Rhonda G Barrett, PA-C  atorvastatin (LIPITOR) 40 MG tablet Take 40  mg by mouth daily.    Historical Provider, MD  ATROVENT HFA 17 MCG/ACT inhaler INHALE TWO PUFFS BY MOUTH ONCE DAILY AS NEEDED FOR WHEEZING 11/25/15   Smitty Cords, DO  Black Cohosh 40 MG CAPS Take by mouth daily.    Historical Provider, MD  dorzolamide-timolol (COSOPT) 22.3-6.8 MG/ML ophthalmic solution Place 1 drop into both eyes 2 (two) times daily.    Historical Provider, MD  EFFIENT 10 MG TABS tablet TAKE ONE TABLET BY MOUTH DAILY 06/25/15   Joline Salt Barrett, PA-C  famotidine (PEPCID) 20 MG tablet One at bedtime 08/29/15   Nyoka Cowden, MD  fluticasone [facility]) 50 MCG/ACT nasal spray USE TWO SPRAY(S) IN EACH NOSTRIL DAILY AS NEEDED FOR  RHINITIS 07/25/15   Smitty Cords, DO  folic acid (FOLVITE) 1 MG tablet TAKE 1 TABLET (1 MG TOTAL) BY MOUTH DAILY. 01/22/15   Smitty Cords, DO  methotrexate (RHEUMATREX) 2.5 MG tablet Take 5 tablets (12.5 mg total) by mouth 2 (two) times a week. Take 5 tablets every Mon and Tues. Caution:Chemotherapy. Protect from light. 02/25/15   Smitty Cords, DO  metoprolol (LOPRESSOR) 50 MG tablet Take 1 tablet (50 mg total) by mouth 2 (two) times daily. 05/24/15   Joline Salt Barrett, PA-C  Multiple Vitamin (MULTIVITAMIN WITH MINERALS) TABS tablet Take 1 tablet by mouth daily.    Historical Provider, MD  nitroGLYCERIN (NITROSTAT) 0.4 MG SL tablet Place 0.4 mg under the tongue every 5 (five) minutes as needed for chest pain (x 3pills daily).    Historical Provider, MD  OLANZapine (ZYPREXA) 5 MG tablet TAKE ONE TABLET BY MOUTH AT BEDTIME 11/25/15   Smitty Cords, DO  omeprazole (PRILOSEC) 40 MG capsule TAKE ONE CAPSULE BY MOUTH ONCE DAILY 11/25/15   Smitty Cords, DO  sulfaSALAzine (AZULFIDINE) 500 MG tablet Take 1,000 mg by mouth 2 (two) times daily.    Historical Provider, MD  valACYclovir (VALTREX) 1000 MG tablet Take 1 tablet (1,000 mg total) by mouth 3 (three) times daily. 03/06/16   Nyoka Cowden, MD    Family  History Family History  Problem Relation Age of Onset  . Hypertension Father   . Heart attack Mother   . Diabetes Daughter   . Diabetes Sister   . Lung cancer Brother 4    Social History Social History  Substance Use Topics  . Smoking status: Current Every Day Smoker    Packs/day: 0.50    Years: 40.00    Types: Cigarettes  . Smokeless tobacco: Never Used  . Alcohol use 2.4 oz/week    4 Standard drinks or equivalent per week     Allergies   Ticagrelor; Aspirin; Cymbalta [duloxetine hcl]; Lisinopril; and Tomato   Review of Systems Review of Systems  10 Systems reviewed and all are negative for acute change except as noted in the HPI. Physical Exam Updated Vital Signs BP 132/74   Pulse 96   Temp 100.4 F (38 C)   Resp 19  Ht 5\' 7"  (1.702 m)   Wt 144 lb (65.3 kg)   SpO2 100%   BMI 22.55 kg/m   Physical Exam  Constitutional: She is oriented to person, place, and time. She appears well-developed and well-nourished. No distress.  HENT:  Head: Normocephalic and atraumatic.  Nose: Nose normal.  Mouth/Throat: Oropharynx is clear and moist. No oropharyngeal exudate.  Eyes: Conjunctivae and EOM are normal. Pupils are equal, round, and reactive to light. No scleral icterus.  Neck: Normal range of motion. Neck supple. No JVD present. No tracheal deviation present. No thyromegaly present.  Cardiovascular: Regular rhythm and normal heart sounds.  Tachycardia present.  Exam reveals no gallop and no friction rub.   No murmur heard. Pulmonary/Chest: Effort normal and breath sounds normal. No respiratory distress. She has no wheezes. She exhibits no tenderness.  Abdominal: Soft. Bowel sounds are normal. She exhibits no distension and no mass. There is no tenderness. There is no rebound and no guarding.  Musculoskeletal: Normal range of motion. She exhibits no edema or tenderness.  Normal strength and sensation in all extremities.  Lymphadenopathy:    She has no cervical  adenopathy.  Neurological: She is alert and oriented to person, place, and time. No cranial nerve deficit. She exhibits normal muscle tone.  Normal cerebellar testing.  Skin: Skin is warm and dry. No rash noted. No erythema. No pallor.  Erythematous vesicular rash on RUE along the C8 and T1 distribution. Warm and TTP.  Nursing note and vitals reviewed.  ED Treatments / Results  DIAGNOSTIC STUDIES: Oxygen Saturation is 100% on RA, normal by my interpretation.    COORDINATION OF CARE: 12:17 AM Discussed treatment plan with pt at bedside and pt agreed to plan.  Labs (all labs ordered are listed, but only abnormal results are displayed) Labs Reviewed  CBC WITH DIFFERENTIAL/PLATELET - Abnormal; Notable for the following:       Result Value   RBC 3.22 (*)    Hemoglobin 11.0 (*)    HCT 32.5 (*)    MCV 100.9 (*)    MCH 34.2 (*)    Lymphs Abs 0.6 (*)    All other components within normal limits  BASIC METABOLIC PANEL - Abnormal; Notable for the following:    Sodium 122 (*)    Chloride 91 (*)    Creatinine, Ser 1.07 (*)    Calcium 8.6 (*)    GFR calc non Af Amer 55 (*)    All other components within normal limits  URINALYSIS, ROUTINE W REFLEX MICROSCOPIC (NOT AT [facility]) - Abnormal; Notable for the following:    Leukocytes, UA SMALL (*)    All other components within normal limits  ETHANOL - Abnormal; Notable for the following:    Alcohol, Ethyl (B) 5 (*)    All other components within normal limits  MAGNESIUM - Abnormal; Notable for the following:    Magnesium 1.6 (*)    All other components within normal limits  BASIC METABOLIC PANEL - Abnormal; Notable for the following:    Sodium 125 (*)    Chloride 91 (*)    Calcium 8.5 (*)    GFR calc non Af Amer 60 (*)    All other components within normal limits  URINE MICROSCOPIC-ADD ON - Abnormal; Notable for the following:    Squamous Epithelial / LPF 0-5 (*)    Bacteria, UA RARE (*)    All other components within normal limits   URINE CULTURE  URINE RAPID DRUG SCREEN, HOSP PERFORMED  CBG MONITORING, ED    EKG  EKG Interpretation  Date/Time:  Sunday March 08 2016 00:20:53 EDT Ventricular Rate:  88 PR Interval:    QRS Duration: 78 QT Interval:  364 QTC Calculation: 441 R Axis:   77 Text Interpretation:  Sinus rhythm No significant change since last tracing Confirmed by Erroll Luna [id number]) on 03/08/2016 12:34:44 AM       Radiology Dg Chest 2 View  Result Date: 03/08/2016 CLINICAL DATA:  Syncope a tonight.  Smoker.  Hypertension. EXAM: CHEST  2 VIEW COMPARISON:  Chest radiographs and chest CTA dated 12/26/2015. FINDINGS: Normal sized heart. Bilateral nipple shadows. Slightly less prominent patchy density at the left lung base. No significant change in a small amount of patchy and linear density at the right lung base. No acute bony abnormality. IMPRESSION: 1. Minimally improved patchy interstitial disease in the left lower lobe. 2. Stable mild linear scarring and patchy interstitial disease in the right lower lobe. 3. No acute abnormality. Electronically Signed   By: Beckie Salts M.D.   On: 03/08/2016 02:05    Procedures Procedures (including critical care time)  Medications Ordered in ED Medications  sodium chloride 0.9 % bolus 1,000 mL (0 mLs Intravenous Stopped 03/08/16 0116)  magnesium sulfate IVPB 2 g 50 mL (2 g Intravenous New Bag/Given 03/08/16 0113)     Initial Impression / Assessment and Plan / ED Course  I have reviewed the triage vital signs and the nursing notes.  Pertinent labs & imaging results that were available during my care of the patient were reviewed by me and considered in my medical decision making (see chart for details).  Clinical Course    Patient presents to the ED for syncopal episode, possibly near syncope as the patient can state how long she was out. Labs significant for low magnesium and sodium.  She was given mag and IVF.  Recheck reveals a sodium of 125,  will order another liter of IVF.  Patient now has a fever.  Tylenol given.  Will admit for further evaluation.  Final Clinical Impressions(s) / ED Diagnoses   Final diagnoses:  None    New Prescriptions New Prescriptions   No medications on file     I personally performed the services described in this documentation, which was scribed in my presence. The recorded information has been reviewed and is accurate.      Tomasita Crumble, MD 03/08/16 1723

## 2016-03-08 NOTE — Assessment & Plan Note (Signed)
PFTs 03/06/2016  VC 1.63 (57%) and dlco 33% correct to 60% but actively smoking   Nothing to offer in this clinic esp if can't stop smoking completely (see separate a/p)

## 2016-03-09 ENCOUNTER — Other Ambulatory Visit (HOSPITAL_COMMUNITY): Payer: Medicaid Other

## 2016-03-09 DIAGNOSIS — F17210 Nicotine dependence, cigarettes, uncomplicated: Secondary | ICD-10-CM | POA: Diagnosis present

## 2016-03-09 DIAGNOSIS — I10 Essential (primary) hypertension: Secondary | ICD-10-CM | POA: Diagnosis present

## 2016-03-09 DIAGNOSIS — B029 Zoster without complications: Secondary | ICD-10-CM | POA: Diagnosis present

## 2016-03-09 DIAGNOSIS — E871 Hypo-osmolality and hyponatremia: Secondary | ICD-10-CM | POA: Diagnosis present

## 2016-03-09 DIAGNOSIS — Z955 Presence of coronary angioplasty implant and graft: Secondary | ICD-10-CM | POA: Diagnosis not present

## 2016-03-09 DIAGNOSIS — M349 Systemic sclerosis, unspecified: Secondary | ICD-10-CM | POA: Diagnosis present

## 2016-03-09 DIAGNOSIS — Z7982 Long term (current) use of aspirin: Secondary | ICD-10-CM | POA: Diagnosis not present

## 2016-03-09 DIAGNOSIS — Z7951 Long term (current) use of inhaled steroids: Secondary | ICD-10-CM | POA: Diagnosis not present

## 2016-03-09 DIAGNOSIS — E860 Dehydration: Secondary | ICD-10-CM | POA: Diagnosis present

## 2016-03-09 DIAGNOSIS — J449 Chronic obstructive pulmonary disease, unspecified: Secondary | ICD-10-CM | POA: Diagnosis present

## 2016-03-09 DIAGNOSIS — K219 Gastro-esophageal reflux disease without esophagitis: Secondary | ICD-10-CM | POA: Diagnosis present

## 2016-03-09 DIAGNOSIS — I2510 Atherosclerotic heart disease of native coronary artery without angina pectoris: Secondary | ICD-10-CM | POA: Diagnosis present

## 2016-03-09 DIAGNOSIS — R509 Fever, unspecified: Secondary | ICD-10-CM | POA: Diagnosis not present

## 2016-03-09 DIAGNOSIS — I270 Primary pulmonary hypertension: Secondary | ICD-10-CM | POA: Diagnosis not present

## 2016-03-09 DIAGNOSIS — M069 Rheumatoid arthritis, unspecified: Secondary | ICD-10-CM | POA: Diagnosis present

## 2016-03-09 DIAGNOSIS — L97519 Non-pressure chronic ulcer of other part of right foot with unspecified severity: Secondary | ICD-10-CM | POA: Diagnosis present

## 2016-03-09 DIAGNOSIS — I252 Old myocardial infarction: Secondary | ICD-10-CM | POA: Diagnosis not present

## 2016-03-09 DIAGNOSIS — D899 Disorder involving the immune mechanism, unspecified: Secondary | ICD-10-CM | POA: Diagnosis not present

## 2016-03-09 DIAGNOSIS — R55 Syncope and collapse: Secondary | ICD-10-CM | POA: Diagnosis present

## 2016-03-09 DIAGNOSIS — I272 Other secondary pulmonary hypertension: Secondary | ICD-10-CM | POA: Diagnosis present

## 2016-03-09 LAB — CBC
HEMATOCRIT: 27.2 % — AB (ref 36.0–46.0)
Hemoglobin: 9.1 g/dL — ABNORMAL LOW (ref 12.0–15.0)
MCH: 33.7 pg (ref 26.0–34.0)
MCHC: 33.5 g/dL (ref 30.0–36.0)
MCV: 100.7 fL — AB (ref 78.0–100.0)
Platelets: 128 10*3/uL — ABNORMAL LOW (ref 150–400)
RBC: 2.7 MIL/uL — AB (ref 3.87–5.11)
RDW: 13.3 % (ref 11.5–15.5)
WBC: 4 10*3/uL (ref 4.0–10.5)

## 2016-03-09 LAB — COMPREHENSIVE METABOLIC PANEL
ALT: 21 U/L (ref 14–54)
AST: 30 U/L (ref 15–41)
Albumin: 3 g/dL — ABNORMAL LOW (ref 3.5–5.0)
Alkaline Phosphatase: 99 U/L (ref 38–126)
Anion gap: 7 (ref 5–15)
BILIRUBIN TOTAL: 0.8 mg/dL (ref 0.3–1.2)
CO2: 23 mmol/L (ref 22–32)
CREATININE: 0.97 mg/dL (ref 0.44–1.00)
Calcium: 8 mg/dL — ABNORMAL LOW (ref 8.9–10.3)
Chloride: 102 mmol/L (ref 101–111)
Glucose, Bld: 91 mg/dL (ref 65–99)
Potassium: 3.6 mmol/L (ref 3.5–5.1)
Sodium: 132 mmol/L — ABNORMAL LOW (ref 135–145)
TOTAL PROTEIN: 6 g/dL — AB (ref 6.5–8.1)

## 2016-03-09 LAB — URINE CULTURE

## 2016-03-09 LAB — GLUCOSE, CAPILLARY
Glucose-Capillary: 99 mg/dL (ref 65–99)
Glucose-Capillary: 78 mg/dL (ref 65–99)
Glucose-Capillary: 102 mg/dL — ABNORMAL HIGH (ref 65–99)
Glucose-Capillary: 82 mg/dL (ref 65–99)

## 2016-03-09 LAB — MAGNESIUM: MAGNESIUM: 1.8 mg/dL (ref 1.7–2.4)

## 2016-03-09 MED ORDER — ACETAMINOPHEN 325 MG PO TABS
325.0000 mg | ORAL_TABLET | ORAL | Status: DC | PRN
  Administered 2016-03-09: 325 mg via ORAL
  Filled 2016-03-09: qty 1

## 2016-03-09 MED ORDER — POTASSIUM CHLORIDE CRYS ER 20 MEQ PO TBCR
40.0000 meq | EXTENDED_RELEASE_TABLET | Freq: Once | ORAL | Status: AC
  Administered 2016-03-09: 40 meq via ORAL
  Filled 2016-03-09: qty 2

## 2016-03-09 MED ORDER — ENSURE ENLIVE PO LIQD
237.0000 mL | Freq: Three times a day (TID) | ORAL | Status: DC
  Administered 2016-03-10 – 2016-03-13 (×9): 237 mL via ORAL

## 2016-03-09 MED ORDER — OXYCODONE-ACETAMINOPHEN 5-325 MG PO TABS
1.0000 | ORAL_TABLET | ORAL | Status: DC | PRN
  Administered 2016-03-09: 1 via ORAL
  Filled 2016-03-09: qty 1

## 2016-03-09 NOTE — Progress Notes (Signed)
PROGRESS NOTE  Jocelyn Shelton UVO:[id number] DOB: 04-09-1956 DOA: 03/08/2016 PCP: Wendee Beavers, DO  HPI/Recap of past 24 hours:  tmax 101.9 last night, patient report she did not feel the fever, she is feeling better overall  She has chronic cough which in not changes, she denies sob, no chest pain, no abdominal pain, no diarrhea  Assessment/Plan: Principal Problem:   Syncope Active Problems:   Rheumatoid arthritis (HCC)   CAD in native artery   COPD GOLD 0 still smoking   Scleroderma (HCC)   Hyponatremia   Immunosuppressed status (HCC)   Shingles  Syncope:  possible from dehydration, echo with adequate LVEF,grade I diastolic dysfunction, ekg unremarkable, tele with sinus rhythm with a few pac's, s/p hydration. orthostatic vital signs after hydration wnl, does has chronic cough, no hypoxia, no chest pain, no tachycardia, low suspicion for PE,  Patient is also on zyprexa which could cause syncope though she reports she has  been on it since the 90's No more syncope episode after being admitted to the hospital, consider outpatient cardiac monitor  cutaneous shingles : possible cross two different dermatomes, one patch on upper arm , medial side close to axilla that is crusted, another patch with free blisters on anterior proximal forearm close to elbow, no open blister seen. She also has a few scattered lesion on medial side of distal anterior forearm and a few of hand paler surface on ulcer side.  She was started on valtrex two days ago, she reported symptom started a week prior to starting treatment, will switch to high dose acyclovir IV in this immunosuppressed individual with singles cross more than one dermatomes.  On precaution, in negative pressure room.  Fever: likely from singles, ua unremarkable, blood culture pending, cxr with chronic changes, she does has chronic cough, will close monitor fever curves and f/u on culture result I have discussed with infectious disease Dr  Gwen Her dam who recommended continue current management, if fever persists, pan CT Scan and call ID for formal consult.  Acute on chronic Hyponatremia:  likely combination of dehydration and the use of zyprexa, review chart hyponatremia started lat 2016,  No confusion,  Na improved with hydration, d/c ivf, encourage oral intake  Hypomagnesemia: replace mag  H/o scleroderma/RA : continue home meds MTX, sulfasalazine   Htn/cad s/p stent, no chest pain, continue home meds lopressor, asa, effient, statin  Chronic cough, closely followed by pulmonology, she just saw her pulmonologist Dr Sherene Sires on 8/11. Continue home meds   Code Status: full  Family Communication: patient   Disposition Plan: pending   Consultants:  Conversation with infectious disease  Procedures:  none  Antibiotics:  none   Objective: BP 135/70 (BP Location: Left Arm)   Pulse 82   Temp 98.1 F (36.7 C) (Oral)   Resp 18   Ht 5\' 6"  (1.676 m)   Wt 65.6 kg (144 lb 9.6 oz)   SpO2 98%   BMI 23.34 kg/m   Intake/Output Summary (Last 24 hours) at 03/09/16 1050 Last data filed at 03/09/16 0900  Gross per 24 hour  Intake           3098.9 ml  Output             2051 ml  Net           1047.9 ml   Filed Weights   03/08/16 0026 03/08/16 1106 03/09/16 0435  Weight: 65.3 kg (144 lb) 65.9 kg (145 lb 3.2 oz) 65.6  kg (144 lb 9.6 oz)    Exam:   General:  NAD   Cardiovascular: RRR  Respiratory: CTABL  Abdomen: Soft/ND/NT, positive BS  Musculoskeletal: No Edema  Neuro: aaox3 Skin: one patch on upper arm , medial side close to axilla that is crusted, another patch with free blisters on anterior proximal forearm close to elbow, no open blister seen. She also has a few scattered lesion on medial side of distal anterior forearm and a few of hand paler surface on ulcer side. Lesion seems healing, now more crusted lesions , no new lesions, pain is much better   Data Reviewed: Basic Metabolic  Panel:  Recent Labs Lab 03/08/16 0025 03/08/16 0244 03/09/16 0446  NA 122* 125* 132*  K 3.6 3.7 3.6  CL 91* 91* 102  CO2 22 22 23   GLUCOSE 94 89 91  BUN 8 8 <5*  CREATININE 1.07* 1.00 0.97  CALCIUM 8.6* 8.5* 8.0*  MG 1.6*  --  1.8   Liver Function Tests:  Recent Labs Lab 03/09/16 0446  AST 30  ALT 21  ALKPHOS 99  BILITOT 0.8  PROT 6.0*  ALBUMIN 3.0*   No results for input(s): LIPASE, AMYLASE in the last 168 hours. No results for input(s): AMMONIA in the last 168 hours. CBC:  Recent Labs Lab 03/08/16 0025 03/09/16 0446  WBC 7.2 4.0  NEUTROABS 6.0  --   HGB 11.0* 9.1*  HCT 32.5* 27.2*  MCV 100.9* 100.7*  PLT 162 128*   Cardiac Enzymes:    Recent Labs Lab 03/08/16 0440  TROPONINI <0.03   BNP (last 3 results)  Recent Labs  05/22/15 0040 05/23/15 1000  BNP 175.4* 363.8*    ProBNP (last 3 results)  Recent Labs  01/02/16 1619  PROBNP 160.0*    CBG:  Recent Labs Lab 03/08/16 0928 03/08/16 1129 03/08/16 1614 03/09/16 0455  GLUCAP 84 61* 101* 99    No results found for this or any previous visit (from the past 240 hour(s)).   Studies: No results found.  Scheduled Meds: . acyclovir  10 mg/kg Intravenous Q8H  . aspirin EC  81 mg Oral Daily  . atorvastatin  40 mg Oral Daily  . dorzolamide-timolol  1 drop Both Eyes BID  . enoxaparin (LOVENOX) injection  40 mg Subcutaneous Q24H  . feeding supplement (ENSURE ENLIVE)  237 mL Oral BID BM  . gabapentin  100 mg Oral BID  . methotrexate  10 mg Oral Once per day on Mon Tue  . metoprolol  50 mg Oral BID  . multivitamin with minerals  1 tablet Oral Daily  . OLANZapine  5 mg Oral QHS  . pantoprazole  80 mg Oral Daily  . potassium chloride  40 mEq Oral Once  . prasugrel  10 mg Oral Daily  . sodium chloride flush  3 mL Intravenous Q12H  . sulfaSALAzine  1,000 mg Oral BID    Continuous Infusions:     Time spent: 12-22-1986    Allahna Husband MD, PhD  [facility] Pager [phone]. If  7PM-7AM, please contact night-coverage at [email or web address], password [facility] 03/09/2016, 10:50 AM  LOS: 0 days

## 2016-03-09 NOTE — Progress Notes (Signed)
Initial Nutrition Assessment  DOCUMENTATION CODES:   Not applicable  INTERVENTION:  Ensure Enlive po TID with meals, each supplement provides 350 kcal and 20 grams of protein  Will provide patient with snack TID (yogurt) in-between meals. RD has set up snack to be delivered by nutritional services at 10am, 2pm, and HS.  Continue multivitamin/mineral daily.  Consider use of appetite stimulate in setting of chronic poor appetite.  NUTRITION DIAGNOSIS:   Inadequate oral intake related to poor appetite as evidenced by meal completion < 50%, per patient/family report.  GOAL:   Patient will meet greater than or equal to 90% of their needs  MONITOR:   PO intake, Supplement acceptance  REASON FOR ASSESSMENT:   Malnutrition Screening Tool    ASSESSMENT:   Jocelyn Shelton is a 60 y.o. 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 Patient presented to the ED after an episode of syncope.   [name] reports that she has had poor appetite and intake for years (unsure of exact timeframe). Her typical intake includes 2-3 Ensure Enlive/day, fruit (grapes, strawberries, cherries) for lunch, homemade soup for dinner, and snacks of peanut butter crackers. She reports that it is normal for her to only finish 25-50% of her meals now, and that is why she supplements with Ensure Enlive and MVM daily. Patient reports UBW 140-145 lbs with no recent weight changes. When she was on prednisone a few years ago her weight had increased to the 160s lbs, but she is now back to her normal weight. As Ms. Indelicato typically eats snacks, she would like yogurt as a snack TID between meals.  Meal Completion: 25-50%  Labs reviewed: Sodium 132, CBG 61-101 past 24 hrs.  Medications reviewed and include: methotrexate, MVM, pantoprazole.  Nutrition-Focused physical exam completed. Findings are no fat  depletion, no muscle depletion, and no edema.   Discussed plan with RN.   Diet Order:  Diet Heart Room service appropriate? Yes; Fluid consistency: Thin  Skin:  Wound (see comment) (Rash right arm)  Last BM:  03/08/2016  Height:   Ht Readings from Last 1 Encounters:  03/08/16 5\' 6"  (1.676 m)    Weight:   Wt Readings from Last 1 Encounters:  03/09/16 144 lb 9.6 oz (65.6 kg)    Ideal Body Weight:  59.1 kg  BMI:  Body mass index is 23.34 kg/m.  Estimated Nutritional Needs:   Kcal:  1600-1800  Protein:  70-80 grams  Fluid:  > 1700 ml  EDUCATION NEEDS:   Education needs addressed (Emphasized adequate protein and calories in diet. Discussed options patient will enjoy. Encourage small, frequent meals to improve intake in setting of low appetite.)  03/11/16, MS, RD, LDN

## 2016-03-10 ENCOUNTER — Inpatient Hospital Stay (HOSPITAL_COMMUNITY): Payer: Medicaid Other

## 2016-03-10 ENCOUNTER — Encounter (HOSPITAL_COMMUNITY): Payer: Self-pay | Admitting: Radiology

## 2016-03-10 DIAGNOSIS — M069 Rheumatoid arthritis, unspecified: Secondary | ICD-10-CM

## 2016-03-10 DIAGNOSIS — R509 Fever, unspecified: Secondary | ICD-10-CM | POA: Diagnosis present

## 2016-03-10 DIAGNOSIS — L97519 Non-pressure chronic ulcer of other part of right foot with unspecified severity: Secondary | ICD-10-CM

## 2016-03-10 DIAGNOSIS — M349 Systemic sclerosis, unspecified: Secondary | ICD-10-CM

## 2016-03-10 DIAGNOSIS — Z79899 Other long term (current) drug therapy: Secondary | ICD-10-CM

## 2016-03-10 DIAGNOSIS — I270 Primary pulmonary hypertension: Secondary | ICD-10-CM

## 2016-03-10 LAB — BASIC METABOLIC PANEL
Anion gap: 9 (ref 5–15)
BUN: <5 mg/dL — ABNORMAL LOW (ref 6–20)
CALCIUM: 8.5 mg/dL — AB (ref 8.9–10.3)
CHLORIDE: 101 mmol/L (ref 101–111)
CO2: 21 mmol/L — AB (ref 22–32)
CREATININE: 1.04 mg/dL — AB (ref 0.44–1.00)
GFR calc non Af Amer: 57 mL/min — ABNORMAL LOW (ref >60)
GLUCOSE: 95 mg/dL (ref 65–99)
Potassium: 4.2 mmol/L (ref 3.5–5.1)
Sodium: 131 mmol/L — ABNORMAL LOW (ref 135–145)

## 2016-03-10 LAB — CBC
HEMATOCRIT: 29.1 % — AB (ref 36.0–46.0)
HEMOGLOBIN: 9.7 g/dL — AB (ref 12.0–15.0)
MCH: 33.9 pg (ref 26.0–34.0)
MCHC: 33.3 g/dL (ref 30.0–36.0)
MCV: 101.7 fL — AB (ref 78.0–100.0)
Platelets: 142 10*3/uL — ABNORMAL LOW (ref 150–400)
RBC: 2.86 MIL/uL — ABNORMAL LOW (ref 3.87–5.11)
RDW: 13.1 % (ref 11.5–15.5)
WBC: 4.9 10*3/uL (ref 4.0–10.5)

## 2016-03-10 LAB — ECHOCARDIOGRAM COMPLETE
Height: 66 in
Weight: 2312 oz

## 2016-03-10 LAB — MRSA PCR SCREENING: MRSA by PCR: NEGATIVE

## 2016-03-10 LAB — GLUCOSE, CAPILLARY
GLUCOSE-CAPILLARY: 90 mg/dL (ref 65–99)
GLUCOSE-CAPILLARY: 97 mg/dL (ref 65–99)
GLUCOSE-CAPILLARY: 115 mg/dL — AB (ref 65–99)

## 2016-03-10 LAB — MAGNESIUM: Magnesium: 1.6 mg/dL — ABNORMAL LOW (ref 1.7–2.4)

## 2016-03-10 MED ORDER — IOPAMIDOL (ISOVUE-300) INJECTION 61%
INTRAVENOUS | Status: AC
  Administered 2016-03-10: 100 mL
  Filled 2016-03-10: qty 100

## 2016-03-10 MED ORDER — MAGNESIUM SULFATE 2 GM/50ML IV SOLN
2.0000 g | Freq: Once | INTRAVENOUS | Status: AC
  Administered 2016-03-10: 2 g via INTRAVENOUS
  Filled 2016-03-10: qty 50

## 2016-03-10 NOTE — Consult Note (Signed)
Date of Admission:  03/08/2016  Date of Consult:  03/10/2016  Reason for Consult: FUO Referring Physician: Dr. Erlinda Hong   HPI: Jocelyn Shelton is an 60 y.o. female with past mental history significant for scleroderma, and rheumatoid arthritis, coronary artery disease as reflux on methotrexate who is admitted after a syncopal event. She had been having some arm pain and ultimately was found to have shingles in at least one if not 2 dermatomes in her arm. She had been started on Valtrex by Dr. Melvyn Novas on August 11 prior to her admission. She has been changed over to intravenous acyclovir. She tells me she has had the outbreak of lesions for a week. She has continued to have fevers despite being on intravenous acyclovir. Imaging of her chest abdomen pelvis is failed to show an another source for her fevers.   Past Medical History:  Diagnosis Date  . CAD in native artery 05/23/2015  . Hypertension   . Reflux   . Rheumatoid arthritis(714.0)   . S/P angioplasty with stent, 05/22/15 pRCA DES and pLCX DES 05/23/2015  . Scleroderma [facility])     Past Surgical History:  Procedure Laterality Date  . APPENDECTOMY  90's  . CARDIAC CATHETERIZATION N/A 05/22/2015   Procedure: Left Heart Cath and Coronary Angiography;  Surgeon: Troy Sine, MD;  Location: [place] [facility];  Service: Cardiovascular;  Laterality: N/A;  . CARDIAC CATHETERIZATION N/A 05/22/2015   Procedure: Coronary Stent Intervention;  Surgeon: Troy Sine, MD;  Location: [place] [facility];  Service: Cardiovascular;  Laterality: N/A;    Social History:  reports that she has been smoking Cigarettes.  She has a 20.00 pack-year smoking history. She has never used smokeless tobacco. She reports that she drinks about 2.4 oz of alcohol per week . She reports that she does not use drugs.   Family History  Problem Relation Age of Onset  . Hypertension Father   . Heart attack Mother   . Diabetes Daughter   . Diabetes Sister   . Lung  cancer Brother 50    Allergies  Allergen Reactions  . Ticagrelor Shortness Of Breath  . Cymbalta [Duloxetine Hcl] Diarrhea and Nausea Only    No appetite, stomach pain  . Aspirin Other (See Comments)    Stomach cramps  . Lisinopril Cough  . Tomato Rash     Medications: I have reviewed patients current medications as documented in Epic Anti-infectives    Start     Dose/Rate Route Frequency Ordered Stop   03/08/16 2000  acyclovir (ZOVIRAX) 660 mg in dextrose 5 % 100 mL IVPB     10 mg/kg  65.9 kg 113.2 mL/hr over 60 Minutes Intravenous Every 8 hours 03/08/16 1629     03/08/16 1445  acyclovir (ZOVIRAX) 660 mg in dextrose 5 % 100 mL IVPB  Status:  Discontinued     10 mg/kg  65.9 kg 113.2 mL/hr over 60 Minutes Intravenous Every 8 hours 03/08/16 1440 03/08/16 1629   03/08/16 1300  valACYclovir (VALTREX) tablet 1,000 mg  Status:  Discontinued     1,000 mg Oral 3 times daily 03/08/16 0351 03/08/16 1440         ROS: as in HPI otherwise remainder of 12 point Review of Systems is negative   Blood pressure 140/74, pulse (!) 104, temperature (!) 100.5 F (38.1 C), temperature source Oral, resp. rate 18, height _0  (1.676 m), weight 144 lb 8 oz (65.5 kg), SpO2 97 %.  General: Alert and awake, oriented x3, not in any acute distress. HEENT: anicteric sclera,  EOMI, oropharynx clear and with mild coating, edendulous Cardiovascular: regular rate, normal r,  no murmur rubs or gallops Pulmonary: clear to auscultation bilaterally, no wheezing, rales or rhonchi Gastrointestinal: soft nontender, nondistended, normal bowel sounds, Musculoskeletal: Skin:  Zoster 03/10/16: Upper arm is scabbed with lesions      Lower arm mostly scabbed lesions other than few in medial aspect of arm       Her right foot with 2 areas with cornified skin that are BOTH very tender to palpation around ulcer       Neuro: nonfocal, strength and sensation intact   Results for orders placed or  performed during the hospital encounter of 03/08/16 (from the past 48 hour(s))  Glucose, capillary     Status: Abnormal   Collection Time: 03/08/16  4:14 PM  Result Value Ref Range   Glucose-Capillary 101 (H) 65 - 99 mg/dL  CBC     Status: Abnormal   Collection Time: 03/09/16  4:46 AM  Result Value Ref Range   WBC 4.0 4.0 - 10.5 K/uL   RBC 2.70 (L) 3.87 - 5.11 MIL/uL   Hemoglobin 9.1 (L) 12.0 - 15.0 g/dL   HCT 27.2 (L) 36.0 - 46.0 %   MCV 100.7 (H) 78.0 - 100.0 fL   MCH 33.7 26.0 - 34.0 pg   MCHC 33.5 30.0 - 36.0 g/dL   RDW 13.3 11.5 - 15.5 %   Platelets 128 (L) 150 - 400 K/uL  Comprehensive metabolic panel     Status: Abnormal   Collection Time: 03/09/16  4:46 AM  Result Value Ref Range   Sodium 132 (L) 135 - 145 mmol/L    Comment: DELTA CHECK NOTED   Potassium 3.6 3.5 - 5.1 mmol/L   Chloride 102 101 - 111 mmol/L   CO2 23 22 - 32 mmol/L   Glucose, Bld 91 65 - 99 mg/dL   BUN <5 (L) 6 - 20 mg/dL   Creatinine, Ser 0.97 0.44 - 1.00 mg/dL   Calcium 8.0 (L) 8.9 - 10.3 mg/dL   Total Protein 6.0 (L) 6.5 - 8.1 g/dL   Albumin 3.0 (L) 3.5 - 5.0 g/dL   AST 30 15 - 41 U/L   ALT 21 14 - 54 U/L   Alkaline Phosphatase 99 38 - 126 U/L   Total Bilirubin 0.8 0.3 - 1.2 mg/dL   GFR calc non Af Amer >60 >60 mL/min   GFR calc Af Amer >60 >60 mL/min    Comment: (NOTE) The eGFR has been calculated using the CKD EPI equation. This calculation has not been validated in all clinical situations. eGFR's persistently <60 mL/min signify possible Chronic Kidney Disease.    Anion gap 7 5 - 15  Magnesium     Status: None   Collection Time: 03/09/16  4:46 AM  Result Value Ref Range   Magnesium 1.8 1.7 - 2.4 mg/dL  Glucose, capillary     Status: None   Collection Time: 03/09/16  4:55 AM  Result Value Ref Range   Glucose-Capillary 99 65 - 99 mg/dL  Glucose, capillary     Status: None   Collection Time: 03/09/16 12:00 PM  Result Value Ref Range   Glucose-Capillary 78 65 - 99 mg/dL  Glucose,  capillary     Status: Abnormal   Collection Time: 03/09/16  4:00 PM  Result Value Ref Range   Glucose-Capillary 102 (H) 65 - 99 mg/dL  Glucose, capillary     Status: None   Collection Time: 03/09/16  8:56 PM  Result Value Ref Range   Glucose-Capillary 82 65 - 99 mg/dL   Comment 1 Notify RN    Comment 2 Document in Chart   Glucose, capillary     Status: None   Collection Time: 03/10/16  6:13 AM  Result Value Ref Range   Glucose-Capillary 90 65 - 99 mg/dL   Comment 1 Notify RN   CBC     Status: Abnormal   Collection Time: 03/10/16  8:43 AM  Result Value Ref Range   WBC 4.9 4.0 - 10.5 K/uL   RBC 2.86 (L) 3.87 - 5.11 MIL/uL   Hemoglobin 9.7 (L) 12.0 - 15.0 g/dL   HCT 29.1 (L) 36.0 - 46.0 %   MCV 101.7 (H) 78.0 - 100.0 fL   MCH 33.9 26.0 - 34.0 pg   MCHC 33.3 30.0 - 36.0 g/dL   RDW 13.1 11.5 - 15.5 %   Platelets 142 (L) 150 - 400 K/uL  Basic metabolic panel     Status: Abnormal   Collection Time: 03/10/16  8:43 AM  Result Value Ref Range   Sodium 131 (L) 135 - 145 mmol/L   Potassium 4.2 3.5 - 5.1 mmol/L   Chloride 101 101 - 111 mmol/L   CO2 21 (L) 22 - 32 mmol/L   Glucose, Bld 95 65 - 99 mg/dL   BUN <5 (L) 6 - 20 mg/dL   Creatinine, Ser 1.04 (H) 0.44 - 1.00 mg/dL   Calcium 8.5 (L) 8.9 - 10.3 mg/dL   GFR calc non Af Amer 57 (L) >60 mL/min   GFR calc Af Amer >60 >60 mL/min    Comment: (NOTE) The eGFR has been calculated using the CKD EPI equation. This calculation has not been validated in all clinical situations. eGFR's persistently <60 mL/min signify possible Chronic Kidney Disease.    Anion gap 9 5 - 15  Magnesium     Status: Abnormal   Collection Time: 03/10/16  8:43 AM  Result Value Ref Range   Magnesium 1.6 (L) 1.7 - 2.4 mg/dL  Glucose, capillary     Status: None   Collection Time: 03/10/16 11:31 AM  Result Value Ref Range   Glucose-Capillary 97 65 - 99 mg/dL  MRSA PCR Screening     Status: None   Collection Time: 03/10/16 12:03 PM  Result Value Ref Range    MRSA by PCR NEGATIVE NEGATIVE    Comment:        The GeneXpert MRSA Assay (FDA approved for NASAL specimens only), is one component of a comprehensive MRSA colonization surveillance program. It is not intended to diagnose MRSA infection nor to guide or monitor treatment for MRSA infections.    _0 (sdes,specrequest,cult,reptstatus)   ) Recent Results (from the past 720 hour(s))  Urine culture     Status: Abnormal   Collection Time: 03/08/16  2:24 AM  Result Value Ref Range Status   Specimen Description URINE, CLEAN CATCH  Final   Special Requests NONE  Final   Culture MULTIPLE SPECIES PRESENT, SUGGEST RECOLLECTION (A)  Final   Report Status 03/09/2016 FINAL  Final  Culture, blood (routine x 2)     Status: None (Preliminary result)   Collection Time: 03/08/16  9:00 AM  Result Value Ref Range Status   Specimen Description BLOOD RIGHT ANTECUBITAL  Final   Special Requests BOTTLES DRAWN AEROBIC AND ANAEROBIC 10CC  Final   Culture NO GROWTH 2  DAYS  Final   Report Status PENDING  Incomplete  Culture, blood (routine x 2)     Status: None (Preliminary result)   Collection Time: 03/08/16  9:13 AM  Result Value Ref Range Status   Specimen Description BLOOD RIGHT HAND  Final   Special Requests BOTTLES DRAWN AEROBIC ONLY 10CC  Final   Culture NO GROWTH 2 DAYS  Final   Report Status PENDING  Incomplete  MRSA PCR Screening     Status: None   Collection Time: 03/10/16 12:03 PM  Result Value Ref Range Status   MRSA by PCR NEGATIVE NEGATIVE Final    Comment:        The GeneXpert MRSA Assay (FDA approved for NASAL specimens only), is one component of a comprehensive MRSA colonization surveillance program. It is not intended to diagnose MRSA infection nor to guide or monitor treatment for MRSA infections.      Impression/Recommendation  Principal Problem:   Syncope Active Problems:   Rheumatoid arthritis ([place])   CAD in native artery   COPD GOLD 0 still smoking    Scleroderma ([place])   Hyponatremia   Immunosuppressed status ([place])   Shingles   Jocelyn Shelton is a 60 y.o. female with immune compromise and dermatomal zoster with FUO  #1 Zoster: fine to continue anti-virals with acyclovir for now but likely change to valtrex soon  #2 FUO: foot is only thing I can find so I will check plain films and then would consider MRI  I will check ESR, CRP.  RA if not controlled can cause fever as well.   She could potentially have some autonomic instability due to VZV which could have precipitated her fall would make sure PT working with her  #3 IP:  Discussed with Dr Baxter Flattery who reviewed clinical images and will DC airborne since lesions are almost all crusted over  Dr. Megan Salon to take over the inpatient service tomorrow.     03/10/2016, 3:41 PM   Thank you so much for this interesting consult  [place] for [place] [phone] (pager) [phone] (office) 03/10/2016, 3:41 PM  Rhina [place] 03/10/2016, 3:41 PM

## 2016-03-10 NOTE — Progress Notes (Signed)
  Echocardiogram 2D Echocardiogram has been performed.  Jocelyn Shelton 03/10/2016, 12:22 PM

## 2016-03-10 NOTE — Progress Notes (Addendum)
PROGRESS NOTE  Jocelyn Shelton AYT:[id number] DOB: 1955-08-24 DOA: 03/08/2016 PCP: Wendee Beavers, DO  Brief summary:  Jocelyn Shelton is a 60 y.o. 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  Patient presents to the ED after an episode of syncope.  Episode occurred earlier this evening after she was done using the bathroom and stood up off of the toilet.  No vomiting or diarrhea.  EMS was called and syncopal episode lasted 5 mins per patient.  ED Course: EKG unchanged, lab work shows mild hyponatremia, mild hypomagnesemia.  Patient denies any chest pain recently or with this episode.  Says she is short of breath with exertion but no more recently than usual.  She is started on valtrax two days prior to this hospitalization for shingles, she is to have hyponatremia on presentation, continue spike fever, ID consulted. Consider steroids if no other source of infection.      HPI/Recap of past 24 hours:  Keep spiking fever, tmax 103,   She has chronic cough which in not changes, she denies sob, no chest pain, no abdominal pain, no diarrhea  Assessment/Plan: Principal Problem:   Syncope Active Problems:   Rheumatoid arthritis (H[facility])   CAD in native artery   COPD GOLD 0 still smoking   Scleroderma ([facility])   Hyponatremia   Immunosuppressed status ([facility])   Shingles  Syncope:  possible from dehydration, echo with adequate LVEF,grade I diastolic dysfunction, ekg unremarkable, tele with sinus rhythm with a few pac's, s/p hydration. orthostatic vital signs after hydration wnl, does has chronic cough, no hypoxia, no chest pain, no tachycardia, low suspicion for PE,  Patient is also on zyprexa which could cause syncope though she reports she has  been on it since the 90's No more syncope episode after being admitted to the hospital, consider outpatient cardiac  monitor  cutaneous shingles : possible cross two different dermatomes, one patch on upper arm , medial side close to axilla that is crusted, another patch with free blisters on anterior proximal forearm close to elbow, no open blister seen. She also has a few scattered lesion on medial side of distal anterior forearm and a few of hand paler surface on ulcer side.  She was started on valtrex two days ago, she reported symptom started a week prior to starting treatment, will switch to high dose acyclovir IV in this immunosuppressed individual with singles cross more than one dermatomes.  On precaution, in negative pressure room.  Fever:  from singles?, ua unremarkable, blood culture no growth to date, cxr with chronic changes, she does has chronic cough, will close monitor fever curves and f/u on culture result I have discussed with infectious disease Dr Gwen Her dam on 8/14 who recommended continue current management, if fever persists, pan CT Scan and call ID for formal consult. 8/15, patient keep spiking fever, CT chest /ab/pel ordered,  formal ID consult, consider steroids if no other source of infection besides singles   Acute on chronic Hyponatremia:  likely combination of dehydration and the use of zyprexa, review chart hyponatremia started lat 2016,  No confusion,  Na improved with hydration, d/c ivf, encourage oral intake  Hypomagnesemia: replace mag  H/o scleroderma/RA : continue home meds MTX, sulfasalazine   Htn/cad s/p stent, no chest pain, continue home meds lopressor, asa, effient, statin  Chronic cough, closely followed by pulmonology,  she just saw her pulmonologist Dr Sherene Sires on 8/11. Continue home meds   Code Status: full  Family Communication: patient   Disposition Plan: pending   Consultants:  Conversation with infectious disease on 8/14  Formal ID consult on 8/15  Procedures:  none  Antibiotics:  none   Objective: BP (!) 147/82 (BP Location: Left Arm)    Pulse (!) 106   Temp (!) 103 F (39.4 C) (Oral)   Resp 18   Ht 5\' 6"  (1.676 m)   Wt 65.5 kg (144 lb 8 oz) Comment: scale c  SpO2 100%   BMI 23.32 kg/m   Intake/Output Summary (Last 24 hours) at 03/10/16 0753 Last data filed at 03/10/16 0604  Gross per 24 hour  Intake             1040 ml  Output             2250 ml  Net            -1210 ml   Filed Weights   03/08/16 1106 03/09/16 0435 03/10/16 0601  Weight: 65.9 kg (145 lb 3.2 oz) 65.6 kg (144 lb 9.6 oz) 65.5 kg (144 lb 8 oz)    Exam:   General:  NAD   Cardiovascular: RRR  Respiratory: CTABL  Abdomen: Soft/ND/NT, positive BS  Musculoskeletal: No Edema  Neuro: aaox3 Skin: one patch on upper arm , medial side close to axilla that is crusted, another patch with free blisters on anterior proximal forearm close to elbow, no open blister seen. She also has a few scattered lesion on medial side of distal anterior forearm and a few of hand paler surface on ulcer side. Lesion seems healing, now more crusted lesions , no new lesions, pain is much better   Data Reviewed: Basic Metabolic Panel:  Recent Labs Lab 03/08/16 0025 03/08/16 0244 03/09/16 0446  NA 122* 125* 132*  K 3.6 3.7 3.6  CL 91* 91* 102  CO2 22 22 23   GLUCOSE 94 89 91  BUN 8 8 <5*  CREATININE 1.07* 1.00 0.97  CALCIUM 8.6* 8.5* 8.0*  MG 1.6*  --  1.8   Liver Function Tests:  Recent Labs Lab 03/09/16 0446  AST 30  ALT 21  ALKPHOS 99  BILITOT 0.8  PROT 6.0*  ALBUMIN 3.0*   No results for input(s): LIPASE, AMYLASE in the last 168 hours. No results for input(s): AMMONIA in the last 168 hours. CBC:  Recent Labs Lab 03/08/16 0025 03/09/16 0446  WBC 7.2 4.0  NEUTROABS 6.0  --   HGB 11.0* 9.1*  HCT 32.5* 27.2*  MCV 100.9* 100.7*  PLT 162 128*   Cardiac Enzymes:    Recent Labs Lab 03/08/16 0440  TROPONINI <0.03   BNP (last 3 results)  Recent Labs  05/22/15 0040 05/23/15 1000  BNP 175.4* 363.8*    ProBNP (last 3  results)  Recent Labs  01/02/16 1619  PROBNP 160.0*    CBG:  Recent Labs Lab 03/09/16 0455 03/09/16 1200 03/09/16 1600 03/09/16 2056 03/10/16 0613  GLUCAP 99 78 102* 82 90    Recent Results (from the past 240 hour(s))  Urine culture     Status: Abnormal   Collection Time: 03/08/16  2:24 AM  Result Value Ref Range Status   Specimen Description URINE, CLEAN CATCH  Final   Special Requests NONE  Final   Culture MULTIPLE SPECIES PRESENT, SUGGEST RECOLLECTION (A)  Final   Report Status 03/09/2016 FINAL  Final  Culture,  blood (routine x 2)     Status: None (Preliminary result)   Collection Time: 03/08/16  9:00 AM  Result Value Ref Range Status   Specimen Description BLOOD RIGHT ANTECUBITAL  Final   Special Requests BOTTLES DRAWN AEROBIC AND ANAEROBIC 10CC  Final   Culture NO GROWTH 1 DAY  Final   Report Status PENDING  Incomplete  Culture, blood (routine x 2)     Status: None (Preliminary result)   Collection Time: 03/08/16  9:13 AM  Result Value Ref Range Status   Specimen Description BLOOD RIGHT HAND  Final   Special Requests BOTTLES DRAWN AEROBIC ONLY 10CC  Final   Culture NO GROWTH 1 DAY  Final   Report Status PENDING  Incomplete     Studies: No results found.  Scheduled Meds: . acyclovir  10 mg/kg Intravenous Q8H  . aspirin EC  81 mg Oral Daily  . atorvastatin  40 mg Oral Daily  . dorzolamide-timolol  1 drop Both Eyes BID  . enoxaparin (LOVENOX) injection  40 mg Subcutaneous Q24H  . feeding supplement (ENSURE ENLIVE)  237 mL Oral TID WC  . gabapentin  100 mg Oral BID  . methotrexate  10 mg Oral Once per day on Mon Tue  . metoprolol  50 mg Oral BID  . multivitamin with minerals  1 tablet Oral Daily  . OLANZapine  5 mg Oral QHS  . pantoprazole  80 mg Oral Daily  . prasugrel  10 mg Oral Daily  . sodium chloride flush  3 mL Intravenous Q12H  . sulfaSALAzine  1,000 mg Oral BID    Continuous Infusions:     Time spent:    Havanah Nelms MD, PhD  [facility] Pager [phone]. If 7PM-7AM, please contact night-coverage at [email or web address], password [facility] 03/10/2016, 7:53 AM  LOS: 1 day

## 2016-03-11 DIAGNOSIS — J449 Chronic obstructive pulmonary disease, unspecified: Secondary | ICD-10-CM

## 2016-03-11 DIAGNOSIS — B029 Zoster without complications: Secondary | ICD-10-CM

## 2016-03-11 DIAGNOSIS — I2510 Atherosclerotic heart disease of native coronary artery without angina pectoris: Secondary | ICD-10-CM

## 2016-03-11 LAB — BASIC METABOLIC PANEL
ANION GAP: 5 (ref 5–15)
BUN: 5 mg/dL — AB (ref 6–20)
CHLORIDE: 99 mmol/L — AB (ref 101–111)
CO2: 25 mmol/L (ref 22–32)
Calcium: 8.3 mg/dL — ABNORMAL LOW (ref 8.9–10.3)
Creatinine, Ser: 0.97 mg/dL (ref 0.44–1.00)
GFR calc Af Amer: >60 mL/min (ref >60)
GFR calc non Af Amer: >60 mL/min (ref >60)
Glucose, Bld: 97 mg/dL (ref 65–99)
POTASSIUM: 3.9 mmol/L (ref 3.5–5.1)
SODIUM: 129 mmol/L — AB (ref 135–145)

## 2016-03-11 LAB — GLUCOSE, CAPILLARY
GLUCOSE-CAPILLARY: 100 mg/dL — AB (ref 65–99)
GLUCOSE-CAPILLARY: 95 mg/dL (ref 65–99)
GLUCOSE-CAPILLARY: 119 mg/dL — AB (ref 65–99)
Glucose-Capillary: 91 mg/dL (ref 65–99)

## 2016-03-11 LAB — CBC
HCT: 27.2 % — ABNORMAL LOW (ref 36.0–46.0)
HEMOGLOBIN: 9.1 g/dL — AB (ref 12.0–15.0)
MCH: 34.1 pg — AB (ref 26.0–34.0)
MCHC: 33.5 g/dL (ref 30.0–36.0)
MCV: 101.9 fL — AB (ref 78.0–100.0)
Platelets: 158 10*3/uL (ref 150–400)
RBC: 2.67 MIL/uL — AB (ref 3.87–5.11)
RDW: 13.4 % (ref 11.5–15.5)
WBC: 5.1 10*3/uL (ref 4.0–10.5)

## 2016-03-11 LAB — TSH: TSH: 3.625 u[IU]/mL (ref 0.350–4.500)

## 2016-03-11 LAB — C-REACTIVE PROTEIN: CRP: 6.1 mg/dL — AB (ref <1.0)

## 2016-03-11 LAB — MAGNESIUM: MAGNESIUM: 1.9 mg/dL (ref 1.7–2.4)

## 2016-03-11 LAB — OSMOLALITY: Osmolality: 270 mOsm/kg — ABNORMAL LOW (ref 275–295)

## 2016-03-11 LAB — SEDIMENTATION RATE: Sed Rate: 50 mm/hr — ABNORMAL HIGH (ref 0–22)

## 2016-03-11 MED ORDER — VALACYCLOVIR HCL 500 MG PO TABS
1000.0000 mg | ORAL_TABLET | Freq: Three times a day (TID) | ORAL | Status: DC
  Administered 2016-03-11 – 2016-03-13 (×6): 1000 mg via ORAL
  Filled 2016-03-11 (×8): qty 2

## 2016-03-11 NOTE — Progress Notes (Signed)
PROGRESS NOTE        PATIENT DETAILS Name: Jocelyn Shelton Age: 60 y.o. Sex: female Date of Birth: 03-26-1956 Admit Date: 03/08/2016 Admitting Physician Hillary Bow, DO YSA:[id number] Alexia Freestone, DO  Brief Narrative: Patient is a 60 y.o. female with past medical history of scleroderma, rheumatoid arthritis, COPD, history of CAD status post PCI who presented with a syncopal episode. Telemetry continues to be negative, echocardiogram without major abnormalities. Hospital course has been complicated by persistent fever.  Subjective: Fever curve slowly improving. Denies any chest pain or shortness of breath.  Assessment/Plan: Principal Problem: FUO: With no obvious etiology-note sure if this much of fever can be explained from shingles. Blood cultures negative so far. CT scan of the chest and abdomen negative for any infectious etiology. ID following, we will continue to provide supportive care  Active Problems: Herpes zoster to her right shoulder and right hand: Some lesions are now crusting, previously on IV acyclovir-she now is on oral Valtrex-recommendations from infectious disease are to continue 5 more days  from today.  Syncope: Probably vasovagal, orthostatic vitals were negative. Telemetry negative so far. Echocardiogram shows preserved EF. Troponins negative. Doubt any further workup required at this time.  History of CAD-status post PCI October 2016: Without any chest pain or shortness of breath, troponins negative. Continue aspirin, Effient, metoprolol and statin.  Acute on chronic hyponatremia: Presented with a sodium of 122, this has improved with hydration. She appears to have mild chronic hyponatremia at baseline,plans are to continue to provide supportive care and close periodic monitoring of chemistry panel. She appears euvolemic on exam, will check a serum osmolality, urine osmolality and TSH.  History of scleroderma/pulmonary fibrosis/rheumatoid  arthritis: Appears stable, continue with sulfasalazine-probably need to stop methotrexate for now given unexplained fevers.  History of COPD: Stable-lungs are clear. Continue nebulized bronchodilators.Marland Kitchen  GERD: Continue PPI  DVT Prophylaxis: Prophylactic Lovenox   Code Status: Full code   Family Communication: None at bedside  Disposition Plan: Remain inpatient-await PT eval-may need home health services  Antimicrobial agents: IV acyclovir 8/13>> 8/16  Valtrex 8/16>>  Procedures: Echo H/15: EF 60-65%, pulmonary pressure 48 mmHg. Grade 1 diastolic dysfunction  CONSULTS:  ID  Time spent: 25 minutes-Greater than 50% of this time was spent in counseling, explanation of diagnosis, planning of further management, and coordination of care.  MEDICATIONS: Anti-infectives    Start     Dose/Rate Route Frequency Ordered Stop   03/11/16 1230  valACYclovir (VALTREX) tablet 1,000 mg     1,000 mg Oral 3 times daily 03/11/16 1146     03/08/16 2000  acyclovir (ZOVIRAX) 660 mg in dextrose 5 % 100 mL IVPB  Status:  Discontinued     10 mg/kg  65.9 kg 113.2 mL/hr over 60 Minutes Intravenous Every 8 hours 03/08/16 1629 03/11/16 1146   03/08/16 1445  acyclovir (ZOVIRAX) 660 mg in dextrose 5 % 100 mL IVPB  Status:  Discontinued     10 mg/kg  65.9 kg 113.2 mL/hr over 60 Minutes Intravenous Every 8 hours 03/08/16 1440 03/08/16 1629   03/08/16 1300  valACYclovir (VALTREX) tablet 1,000 mg  Status:  Discontinued     1,000 mg Oral 3 times daily 03/08/16 0351 03/08/16 1440      Scheduled Meds: . aspirin EC  81 mg Oral Daily  . atorvastatin  40 mg Oral Daily  .  dorzolamide-timolol  1 drop Both Eyes BID  . enoxaparin (LOVENOX) injection  40 mg Subcutaneous Q24H  . feeding supplement (ENSURE ENLIVE)  237 mL Oral TID WC  . gabapentin  100 mg Oral BID  . methotrexate  10 mg Oral Once per day on Mon Tue  . metoprolol  50 mg Oral BID  . multivitamin with minerals  1 tablet Oral Daily  . OLANZapine   5 mg Oral QHS  . pantoprazole  80 mg Oral Daily  . prasugrel  10 mg Oral Daily  . sodium chloride flush  3 mL Intravenous Q12H  . sulfaSALAzine  1,000 mg Oral BID  . valACYclovir  1,000 mg Oral TID   Continuous Infusions:  PRN Meds:.acetaminophen, albuterol, ipratropium, oxyCODONE-acetaminophen   PHYSICAL EXAM: Vital signs: Vitals:   03/10/16 2124 03/10/16 2130 03/11/16 0606 03/11/16 1336  BP: 140/75  132/75 118/66  Pulse: (!) 102  97 90  Resp: 17  18 20   Temp:  99.6 F (37.6 C) 99.6 F (37.6 C) 99.1 F (37.3 C)  TempSrc:  Oral Oral Oral  SpO2: 99%  100% 98%  Weight:   65 kg (143 lb 3.2 oz)   Height:       Filed Weights   03/09/16 0435 03/10/16 0601 03/11/16 0606  Weight: 65.6 kg (144 lb 9.6 oz) 65.5 kg (144 lb 8 oz) 65 kg (143 lb 3.2 oz)   Body mass index is 23.11 kg/m.   Gen Exam: Awake and alert with clear speech. Not in any distress Neck: Supple, No JVD.   Chest: B/L Clear.   CVS: S1 S2 Regular, no murmurs.  Abdomen: soft, BS +, non tender, non distended.  Extremities: no edema, lower extremities warm to touch. Neurologic: Non Focal.   Skin: Some vesicular rash in her forearm near the elbow, mostly crusted lesion seen in the right arm.  Wounds: N/A.   I have personally reviewed following labs and imaging studies  LABORATORY DATA: CBC:  Recent Labs Lab 03/08/16 0025 03/09/16 0446 03/10/16 0843 03/11/16 0820  WBC 7.2 4.0 4.9 5.1  NEUTROABS 6.0  --   --   --   HGB 11.0* 9.1* 9.7* 9.1*  HCT 32.5* 27.2* 29.1* 27.2*  MCV 100.9* 100.7* 101.7* 101.9*  PLT 162 128* 142* 158    Basic Metabolic Panel:  Recent Labs Lab 03/08/16 0025 03/08/16 0244 03/09/16 0446 03/10/16 0843 03/11/16 0820  NA 122* 125* 132* 131* 129*  K 3.6 3.7 3.6 4.2 3.9  CL 91* 91* 102 101 99*  CO2 22 22 23  21* 25  GLUCOSE 94 89 91 95 97  BUN 8 8 <5* <5* 5*  CREATININE 1.07* 1.00 0.97 1.04* 0.97  CALCIUM 8.6* 8.5* 8.0* 8.5* 8.3*  MG 1.6*  --  1.8 1.6* 1.9     GFR: Estimated Creatinine Clearance: 57 mL/min (by C-G formula based on SCr of 0.97 mg/dL).  Liver Function Tests:  Recent Labs Lab 03/09/16 0446  AST 30  ALT 21  ALKPHOS 99  BILITOT 0.8  PROT 6.0*  ALBUMIN 3.0*   No results for input(s): LIPASE, AMYLASE in the last 168 hours. No results for input(s): AMMONIA in the last 168 hours.  Coagulation Profile: No results for input(s): INR, PROTIME in the last 168 hours.  Cardiac Enzymes:  Recent Labs Lab 03/08/16 0440  TROPONINI <0.03    BNP (last 3 results)  Recent Labs  01/02/16 1619  PROBNP 160.0*    HbA1C: No results for input(s): HGBA1C in  the last 72 hours.  CBG:  Recent Labs Lab 03/10/16 0613 03/10/16 1131 03/10/16 1654 03/11/16 0604 03/11/16 1125  GLUCAP 90 97 115* 100* 95    Lipid Profile: No results for input(s): CHOL, HDL, LDLCALC, TRIG, CHOLHDL, LDLDIRECT in the last 72 hours.  Thyroid Function Tests: No results for input(s): TSH, T4TOTAL, FREET4, T3FREE, THYROIDAB in the last 72 hours.  Anemia Panel: No results for input(s): VITAMINB12, FOLATE, FERRITIN, TIBC, IRON, RETICCTPCT in the last 72 hours.  Urine analysis:    Component Value Date/Time   COLORURINE YELLOW 03/08/2016 0224   APPEARANCEUR CLEAR 03/08/2016 0224   LABSPEC 1.010 03/08/2016 0224   PHURINE 6.5 03/08/2016 0224   GLUCOSEU NEGATIVE 03/08/2016 0224   HGBUR NEGATIVE 03/08/2016 0224   BILIRUBINUR NEGATIVE 03/08/2016 0224   KETONESUR NEGATIVE 03/08/2016 0224   PROTEINUR NEGATIVE 03/08/2016 0224   UROBILINOGEN 1.0 05/22/2015 0227   NITRITE NEGATIVE 03/08/2016 0224   LEUKOCYTESUR SMALL (A) 03/08/2016 0224    Sepsis Labs: Lactic Acid, Venous    Component Value Date/Time   LATICACIDVEN 1.80 02/08/2015 2034    MICROBIOLOGY: Recent Results (from the past 240 hour(s))  Urine culture     Status: Abnormal   Collection Time: 03/08/16  2:24 AM  Result Value Ref Range Status   Specimen Description URINE, CLEAN CATCH   Final   Special Requests NONE  Final   Culture MULTIPLE SPECIES PRESENT, SUGGEST RECOLLECTION (A)  Final   Report Status 03/09/2016 FINAL  Final  Culture, blood (routine x 2)     Status: None (Preliminary result)   Collection Time: 03/08/16  9:00 AM  Result Value Ref Range Status   Specimen Description BLOOD RIGHT ANTECUBITAL  Final   Special Requests BOTTLES DRAWN AEROBIC AND ANAEROBIC 10CC  Final   Culture NO GROWTH 2 DAYS  Final   Report Status PENDING  Incomplete  Culture, blood (routine x 2)     Status: None (Preliminary result)   Collection Time: 03/08/16  9:13 AM  Result Value Ref Range Status   Specimen Description BLOOD RIGHT HAND  Final   Special Requests BOTTLES DRAWN AEROBIC ONLY 10CC  Final   Culture NO GROWTH 2 DAYS  Final   Report Status PENDING  Incomplete  MRSA PCR Screening     Status: None   Collection Time: 03/10/16 12:03 PM  Result Value Ref Range Status   MRSA by PCR NEGATIVE NEGATIVE Final    Comment:        The GeneXpert MRSA Assay (FDA approved for NASAL specimens only), is one component of a comprehensive MRSA colonization surveillance program. It is not intended to diagnose MRSA infection nor to guide or monitor treatment for MRSA infections.     RADIOLOGY STUDIES/RESULTS: Dg Chest 2 View  Result Date: 03/08/2016 CLINICAL DATA:  Syncope a tonight.  Smoker.  Hypertension. EXAM: CHEST  2 VIEW COMPARISON:  Chest radiographs and chest CTA dated 12/26/2015. FINDINGS: Normal sized heart. Bilateral nipple shadows. Slightly less prominent patchy density at the left lung base. No significant change in a small amount of patchy and linear density at the right lung base. No acute bony abnormality. IMPRESSION: 1. Minimally improved patchy interstitial disease in the left lower lobe. 2. Stable mild linear scarring and patchy interstitial disease in the right lower lobe. 3. No acute abnormality. Electronically Signed   By: Beckie Salts M.D.   On: 03/08/2016 02:05    Ct Chest W Contrast  Result Date: 03/10/2016 CLINICAL DATA:  Fever of unknown  origin EXAM: CT CHEST, ABDOMEN, AND PELVIS WITH CONTRAST TECHNIQUE: Multidetector CT imaging of the chest, abdomen and pelvis was performed following the standard protocol during bolus administration of intravenous contrast. CONTRAST:  ISOVUE-300 IOPAMIDOL (ISOVUE-300) INJECTION 61% COMPARISON:  12/26/15 FINDINGS: CT CHEST FINDINGS Mediastinum/Lymph Nodes: Thoracic inlet is within normal limits. Scattered small mediastinal lymph nodes are noted stable from the prior exam. Scattered bilateral axillary adenopathy is noted but stable from prior exam. There remains mild dilatation of the esophagus with air. Cardiovascular: Mild aortic calcifications are seen without aneurysmal dilatation or dissection. The pulmonary artery is within normal limits as visualized. Mild coronary calcifications are seen. Cardiac structures are otherwise within normal limits. Lungs/Pleura: Chronic interstitial changes are again seen in the lower lobes bilaterally. No acute infiltrate or sizable effusion is seen. No definitive parenchymal nodule is noted. Musculoskeletal: No chest wall mass or suspicious bone lesions identified. CT ABDOMEN PELVIS FINDINGS Hepatobiliary: No masses or other significant abnormality. Pancreas: No mass, inflammatory changes, or other significant abnormality. Spleen: Within normal limits in size and appearance. Adrenals/Urinary Tract: No masses identified. No evidence of hydronephrosis. Bladder is well distended. Stomach/Bowel: No evidence of obstruction, inflammatory process, or abnormal fluid collections. The appendix has been surgically removed Vascular/Lymphatic: Diffuse aortoiliac calcifications are noted without aneurysmal dilatation. No significant lymphadenopathy is seen. Reproductive: No mass or other significant abnormality. Other: None. Musculoskeletal:  No suspicious bone lesions identified. IMPRESSION: Chronic  changes in the lower lobes bilaterally stable from prior exam. Mild axillary lymph nodes are seen likely reactive in nature. Chronic changes similar to that seen on the prior exam. No acute abnormality is noted. Electronically Signed   By: Alcide Clever M.D.   On: 03/10/2016 09:44   Ct Abdomen Pelvis W Contrast  Result Date: 03/10/2016 CLINICAL DATA:  Fever of unknown origin EXAM: CT CHEST, ABDOMEN, AND PELVIS WITH CONTRAST TECHNIQUE: Multidetector CT imaging of the chest, abdomen and pelvis was performed following the standard protocol during bolus administration of intravenous contrast. CONTRAST:  ISOVUE-300 IOPAMIDOL (ISOVUE-300) INJECTION 61% COMPARISON:  12/26/15 FINDINGS: CT CHEST FINDINGS Mediastinum/Lymph Nodes: Thoracic inlet is within normal limits. Scattered small mediastinal lymph nodes are noted stable from the prior exam. Scattered bilateral axillary adenopathy is noted but stable from prior exam. There remains mild dilatation of the esophagus with air. Cardiovascular: Mild aortic calcifications are seen without aneurysmal dilatation or dissection. The pulmonary artery is within normal limits as visualized. Mild coronary calcifications are seen. Cardiac structures are otherwise within normal limits. Lungs/Pleura: Chronic interstitial changes are again seen in the lower lobes bilaterally. No acute infiltrate or sizable effusion is seen. No definitive parenchymal nodule is noted. Musculoskeletal: No chest wall mass or suspicious bone lesions identified. CT ABDOMEN PELVIS FINDINGS Hepatobiliary: No masses or other significant abnormality. Pancreas: No mass, inflammatory changes, or other significant abnormality. Spleen: Within normal limits in size and appearance. Adrenals/Urinary Tract: No masses identified. No evidence of hydronephrosis. Bladder is well distended. Stomach/Bowel: No evidence of obstruction, inflammatory process, or abnormal fluid collections. The appendix has been surgically  removed Vascular/Lymphatic: Diffuse aortoiliac calcifications are noted without aneurysmal dilatation. No significant lymphadenopathy is seen. Reproductive: No mass or other significant abnormality. Other: None. Musculoskeletal:  No suspicious bone lesions identified. IMPRESSION: Chronic changes in the lower lobes bilaterally stable from prior exam. Mild axillary lymph nodes are seen likely reactive in nature. Chronic changes similar to that seen on the prior exam. No acute abnormality is noted. Electronically Signed   By: Eulah Pont.D.  On: 03/10/2016 09:44   Dg Foot Complete Right  Result Date: 03/10/2016 CLINICAL DATA:  Multiple sores on plantar surface of the foot. Evaluate for osteomyelitis. EXAM: RIGHT FOOT COMPLETE - 3+ VIEW COMPARISON:  None. FINDINGS: Bones are diffusely demineralized. No definite lytic or destructive lesion to suggest osteomyelitis. No evidence for fracture. No dislocation. IMPRESSION: Negative. Electronically Signed   By: Kennith Center M.D.   On: 03/10/2016 18:14     LOS: 2 days   Jeoffrey Massed, MD  [facility] Pager:[phone]  If 7PM-7AM, please contact night-coverage [email or web address] Password [email or web address] 03/11/2016, 1:52 PM

## 2016-03-11 NOTE — Progress Notes (Signed)
Refused bed alarm. Will continue to monitor patient. 

## 2016-03-11 NOTE — Progress Notes (Addendum)
Patient ID: Jocelyn Shelton, female   DOB: [birth year], 60 y.o.   MRN: [id number]          [facility]    Date of Admission:  03/08/2016           Day 5 zoster therapy  Principal Problem:   Fever Active Problems:   Syncope   Shingles   Rheumatoid arthritis (HCC)   CAD in native artery   COPD GOLD 0 still smoking   Scleroderma (HCC)   Hyponatremia   Immunosuppressed status (HCC)   Foot ulcer, right (HCC)   . aspirin EC  81 mg Oral Daily  . atorvastatin  40 mg Oral Daily  . dorzolamide-timolol  1 drop Both Eyes BID  . enoxaparin (LOVENOX) injection  40 mg Subcutaneous Q24H  . feeding supplement (ENSURE ENLIVE)  237 mL Oral TID WC  . gabapentin  100 mg Oral BID  . methotrexate  10 mg Oral Once per day on Mon Tue  . metoprolol  50 mg Oral BID  . multivitamin with minerals  1 tablet Oral Daily  . OLANZapine  5 mg Oral QHS  . pantoprazole  80 mg Oral Daily  . prasugrel  10 mg Oral Daily  . sodium chloride flush  3 mL Intravenous Q12H  . sulfaSALAzine  1,000 mg Oral BID  . valACYclovir  1,000 mg Oral TID    SUBJECTIVE: She states that she is feeling "great". She feels like her right arm rash is improving.  Review of Systems: Review of Systems  Constitutional: Negative for chills, diaphoresis and fever.  Respiratory: Negative for cough, sputum production and shortness of breath.   Cardiovascular: Negative for chest pain.  Gastrointestinal: Negative for abdominal pain, diarrhea, nausea and vomiting.  Genitourinary: Negative for dysuria.  Musculoskeletal: Negative for joint pain.  Skin: Positive for rash.    Past Medical History:  Diagnosis Date  . CAD in native artery 05/23/2015  . Hypertension   . Reflux   . Rheumatoid arthritis(714.0)   . S/P angioplasty with stent, 05/22/15 pRCA DES and pLCX DES 05/23/2015  . Scleroderma [facility])     Social History  Substance Use Topics  . Smoking status: Current Every Day Smoker    Packs/day: 0.50      Years: 40.00    Types: Cigarettes  . Smokeless tobacco: Never Used  . Alcohol use 2.4 oz/week    4 Standard drinks or equivalent per week    Family History  Problem Relation Age of Onset  . Hypertension Father   . Heart attack Mother   . Diabetes Daughter   . Diabetes Sister   . Lung cancer Brother 50   Allergies  Allergen Reactions  . Ticagrelor Shortness Of Breath  . Cymbalta [Duloxetine Hcl] Diarrhea and Nausea Only    No appetite, stomach pain  . Aspirin Other (See Comments)    Stomach cramps  . Lisinopril Cough  . Tomato Rash    OBJECTIVE: Vitals:   03/10/16 1139 03/10/16 2124 03/10/16 2130 03/11/16 0606  BP: 140/74 140/75  132/75  Pulse: (!) 104 (!) 102  97  Resp: 18 17  18   Temp: (!) 100.5 F (38.1 C)  99.6 F (37.6 C) 99.6 F (37.6 C)  TempSrc: Oral  Oral Oral  SpO2: 97% 99%  100%  Weight:    143 lb 3.2 oz (65 kg)  Height:       Body mass index is 23.11 kg/m.  Physical Exam  Constitutional:  She is oriented to person, place, and time.  She is resting quietly in bed. She is in no distress.  HENT:  Mouth/Throat: No oropharyngeal exudate.  Eyes: Conjunctivae are normal.  Neck: Neck supple.  Cardiovascular: Normal rate and regular rhythm.   No murmur heard. Pulmonary/Chest: Effort normal and breath sounds normal.  Abdominal: Soft. There is no tenderness.  Musculoskeletal: Normal range of motion. She exhibits no edema or tenderness.  Neurological: She is alert and oriented to person, place, and time.  Skin: Rash noted.  Crusting, vesicular rash from right shoulder to right hand.  2 chronic, firm callouses on the sole of her right foot that do not appear infected.  Psychiatric: Mood and affect normal.    Lab Results Lab Results  Component Value Date   WBC 5.1 03/11/2016   HGB 9.1 (L) 03/11/2016   HCT 27.2 (L) 03/11/2016   MCV 101.9 (H) 03/11/2016   PLT 158 03/11/2016    Lab Results  Component Value Date   CREATININE 0.97 03/11/2016    BUN 5 (L) 03/11/2016   NA 129 (L) 03/11/2016   K 3.9 03/11/2016   CL 99 (L) 03/11/2016   CO2 25 03/11/2016    Lab Results  Component Value Date   ALT 21 03/09/2016   AST 30 03/09/2016   ALKPHOS 99 03/09/2016   BILITOT 0.8 03/09/2016     Microbiology: Recent Results (from the past 240 hour(s))  Urine culture     Status: Abnormal   Collection Time: 03/08/16  2:24 AM  Result Value Ref Range Status   Specimen Description URINE, CLEAN CATCH  Final   Special Requests NONE  Final   Culture MULTIPLE SPECIES PRESENT, SUGGEST RECOLLECTION (A)  Final   Report Status 03/09/2016 FINAL  Final  Culture, blood (routine x 2)     Status: None (Preliminary result)   Collection Time: 03/08/16  9:00 AM  Result Value Ref Range Status   Specimen Description BLOOD RIGHT ANTECUBITAL  Final   Special Requests BOTTLES DRAWN AEROBIC AND ANAEROBIC 10CC  Final   Culture NO GROWTH 2 DAYS  Final   Report Status PENDING  Incomplete  Culture, blood (routine x 2)     Status: None (Preliminary result)   Collection Time: 03/08/16  9:13 AM  Result Value Ref Range Status   Specimen Description BLOOD RIGHT HAND  Final   Special Requests BOTTLES DRAWN AEROBIC ONLY 10CC  Final   Culture NO GROWTH 2 DAYS  Final   Report Status PENDING  Incomplete  MRSA PCR Screening     Status: None   Collection Time: 03/10/16 12:03 PM  Result Value Ref Range Status   MRSA by PCR NEGATIVE NEGATIVE Final    Comment:        The GeneXpert MRSA Assay (FDA approved for NASAL specimens only), is one component of a comprehensive MRSA colonization surveillance program. It is not intended to diagnose MRSA infection nor to guide or monitor treatment for MRSA infections.      ASSESSMENT: Her shingles is improving. She is afebrile currently and there is no obvious source for her recent fevers. I will change acyclovir back to oral valacyclovir and plan on 5 more days of therapy.  PLAN: 1. Change back to valacyclovir  Cliffton Asters, MD [facility] [phone] pager   [phone] cell 03/11/2016, 12:50 PM

## 2016-03-11 NOTE — Evaluation (Signed)
Physical Therapy Evaluation Patient Details Name: Jocelyn Shelton MRN: [id number] DOB: 04/15/1956 Today's Date: 03/11/2016   History of Present Illness  Patient is a 60 y.o.femalewith past medical history of scleroderma, rheumatoid arthritis, COPD, history of CAD status post PCI who presented with a syncopal episode. Telemetry continues to be negative, echocardiogram without major abnormalities. Hospital course has been complicated by persistent fever. Pt with herpes zoster to her Rt shoulder and Rt hand, receiving treatment.  Pt's PMH includes CAD, rheumatoid arthritis, scleroderma.    Clinical Impression  Pt admitted with above diagnosis. Jocelyn Shelton was Ind PTA and is currently Ind with mobility.  No skilled PT needs identified, PT will sign off.      Follow Up Recommendations No PT follow up    Equipment Recommendations  None recommended by PT    Recommendations for Other Services       Precautions / Restrictions Precautions Precautions: Other (comment) Precaution Comments: Shingles Restrictions Weight Bearing Restrictions: No      Mobility  Bed Mobility Overal bed mobility: Independent                Transfers Overall transfer level: Independent Equipment used: None             General transfer comment: Independent  Ambulation/Gait Ambulation/Gait assistance: Independent Ambulation Distance (Feet): 60 Feet Assistive device: None Gait Pattern/deviations: WFL(Within Functional Limits)   Gait velocity interpretation: at or above normal speed for age/gender General Gait Details: Confined to room as pt with airborne precautions but no instability noted.    Stairs            Wheelchair Mobility    Modified Rankin (Stroke Patients Only)       Balance Overall balance assessment: Independent                               Standardized Balance Assessment Standardized Balance Assessment : Berg Balance Test Berg Balance Test Sit to  Stand: Able to stand without using hands and stabilize independently Standing Unsupported: Able to stand safely 2 minutes Sitting with Back Unsupported but Feet Supported on Floor or Stool: Able to sit safely and securely 2 minutes Stand to Sit: Sits safely with minimal use of hands Transfers: Able to transfer safely, minor use of hands Standing Unsupported with Eyes Closed: Able to stand 10 seconds safely Standing Ubsupported with Feet Together: Able to place feet together independently and stand 1 minute safely From Standing Position, Pick up Object from Floor: Able to pick up shoe safely and easily Turn 360 Degrees: Able to turn 360 degrees safely in 4 seconds or less Standing Unsupported, Alternately Place Feet on Step/Stool: Able to stand independently and safely and complete 8 steps in 20 seconds         Pertinent Vitals/Pain Pain Assessment: No/denies pain    Home Living Family/patient expects to be discharged to:: Other (Comment) [employer]) Living Arrangements: Children;Other relatives Available Help at Discharge: Family;Available 24 hours/day Type of Home: Other(Comment) [facility]) Home Access: Elevator     Home Layout: One level Home Equipment: Cane - single point Additional Comments: Pt says she has been living in hotel for the past few years with her daughter and grandson, this is where she plans to return.    Prior Function Level of Independence: Independent with assistive device(s)         Comments: Uses her cane in the community but otherwise does not use  AD.  Does the cooking, cleaning.  Denies any falls in the past year.  Says she doesn't do the driving, "I don't like having to do the quick changing from the gas to the break pedal"     Hand Dominance        Extremity/Trunk Assessment   Upper Extremity Assessment: Overall WFL for tasks assessed (shingles Rt UE)           Lower Extremity Assessment: Overall WFL for tasks assessed      Cervical / Trunk  Assessment: Normal  Communication   Communication: No difficulties  Cognition Arousal/Alertness: Awake/alert Behavior During Therapy: WFL for tasks assessed/performed Overall Cognitive Status: Within Functional Limits for tasks assessed                      General Comments      Exercises General Exercises - Lower Extremity Ankle Circles/Pumps: AROM;Both;10 reps;Seated Long Arc Quad: AROM;Both;10 reps;Seated      Assessment/Plan    PT Assessment Patent does not need any further PT services  PT Diagnosis Difficulty walking   PT Problem List    PT Treatment Interventions     PT Goals (Current goals can be found in the Care Plan section) Acute Rehab PT Goals Patient Stated Goal: to go home PT Goal Formulation: All assessment and education complete, DC therapy    Frequency     Barriers to discharge        Co-evaluation               End of Session   Activity Tolerance: Patient tolerated treatment well Patient left: in chair;with call bell/phone within reach;with chair alarm set Nurse Communication: Mobility status         Time: 9357-0177 PT Time Calculation (min) (ACUTE ONLY): 15 min   Charges:   PT Evaluation $PT Eval Low Complexity: 1 Procedure     PT G Codes:       Encarnacion Chu PT, DPT  Pager: [phone] Phone: [phone] 03/11/2016, 3:35 PM

## 2016-03-12 DIAGNOSIS — R509 Fever, unspecified: Secondary | ICD-10-CM

## 2016-03-12 DIAGNOSIS — E871 Hypo-osmolality and hyponatremia: Secondary | ICD-10-CM

## 2016-03-12 DIAGNOSIS — L97519 Non-pressure chronic ulcer of other part of right foot with unspecified severity: Secondary | ICD-10-CM

## 2016-03-12 LAB — BASIC METABOLIC PANEL
ANION GAP: 8 (ref 5–15)
BUN: 6 mg/dL (ref 6–20)
CHLORIDE: 98 mmol/L — AB (ref 101–111)
CO2: 26 mmol/L (ref 22–32)
Calcium: 8.5 mg/dL — ABNORMAL LOW (ref 8.9–10.3)
Creatinine, Ser: 0.89 mg/dL (ref 0.44–1.00)
GFR calc non Af Amer: >60 mL/min (ref >60)
Glucose, Bld: 101 mg/dL — ABNORMAL HIGH (ref 65–99)
Potassium: 4.2 mmol/L (ref 3.5–5.1)
Sodium: 132 mmol/L — ABNORMAL LOW (ref 135–145)

## 2016-03-12 LAB — CBC
HEMATOCRIT: 26.6 % — AB (ref 36.0–46.0)
HEMOGLOBIN: 8.8 g/dL — AB (ref 12.0–15.0)
MCH: 33.7 pg (ref 26.0–34.0)
MCHC: 33.1 g/dL (ref 30.0–36.0)
MCV: 101.9 fL — ABNORMAL HIGH (ref 78.0–100.0)
Platelets: 171 10*3/uL (ref 150–400)
RBC: 2.61 MIL/uL — ABNORMAL LOW (ref 3.87–5.11)
RDW: 13.4 % (ref 11.5–15.5)
WBC: 4.7 10*3/uL (ref 4.0–10.5)

## 2016-03-12 LAB — GLUCOSE, CAPILLARY
GLUCOSE-CAPILLARY: 106 mg/dL — AB (ref 65–99)
GLUCOSE-CAPILLARY: 96 mg/dL (ref 65–99)
Glucose-Capillary: 96 mg/dL (ref 65–99)
Glucose-Capillary: 100 mg/dL — ABNORMAL HIGH (ref 65–99)

## 2016-03-12 LAB — HIV ANTIBODY (ROUTINE TESTING W REFLEX): HIV Screen 4th Generation wRfx: NONREACTIVE

## 2016-03-12 LAB — OSMOLALITY, URINE: Osmolality, Ur: 376 mOsm/kg (ref 300–900)

## 2016-03-12 LAB — SODIUM, URINE, RANDOM: SODIUM UR: 21 mmol/L

## 2016-03-12 NOTE — Progress Notes (Signed)
Family Medicine Teaching Service Daily Progress Note Intern Pager: [phone]  Patient name: Jocelyn Shelton Medical record number: [id number] Date of birth: 1955-11-29 Age: 60 y.o. Gender: female  Primary Care Provider: Midway Bing, DO Consultants: Infectious Disease Code Status: FULL  Pt Overview and Major Events to Date:  Admit by [facility] 8/13 Care by [facility] 8/17  Assessment and Plan: Jocelyn Shelton is a 60 y.o. 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 Patient presented to the ED after an episode of syncope on 8/13, admitted by [facility].  Reported that she felt like her BP was high prior to her syncopal episode.Shingles noted to right forearm, currently on antibiotics. Episode occurred earlier that evening after she was done using the bathroom and stood up off of the toilet.  No vomiting or diarrhea.  EMS was called and syncopal episode lasted 5 mins per patient. EKG unchanged from prior, lab work showed mild hyponatremia, mild hypomagnesemia.  Patient denied any chest pain recently or with this episode.  Stated she is short of breath with exertion but no more recently than usual. Developed fevers with no obvious etiology starting 8/13.   #FUO  -with no obvious etiology, unsure if this fever can be explained by shingles -Tmax 103.4 on 8/14, fevers since 8/12 -Bcx Neg x 3 days so far -CT scan of the chest and abdomen negative for any infectious etiology  -ID following, will continue to provide supportive care -HIV nonreactive -CRP elevated at 6.1   ESR elevated at 50.   #Herpes zoster to right shoulder and right hand -Some lesions are now crusting -previously on IV acyclovir, transitioned to oral Valtrex yesterday. Per recommendations of ID to continue 4 more days from today  #Syncope -Likely vasovagal, orthostatic vitals were negative -Telemetry negative so far - Echo  8/15 showing preserved EF 60-65%.  -Troponins negative. Further workup may not be needed at this time.  #History of CAD-status post PCI October 2016 Stable. -Pt without any chest pain or shortness of breath -EKG negative, troponins Nx3 -Continue aspirin 51m, Effient (Prasugrel) 175mdaily , metoprolol 50 mg BID and Lipitor 40 mg daily  #Acute on chronic hyponatremia: Presented with a sodium of 122 on admission, improved with hydration. BMET this am sodium 132, Cr 0.89 -Appears to have mild chronic hyponatremia at baseline -plan to continue to provide supportive care and close periodic monitoring of BMET. -pt appears euvolemic on exam, check a serum osmolality, urine osmolality and TSH -serum plasma osm 270 --> hypotonic hyponatremia (pt euvolemic) -obtain TSH level: wnl 3.6 -Urine studies showing: Urine osmolality 376, Urine sodium 21 -consider order am Cortisol to check for glucocorticoid deficiency -if not euvolemic on exam, consider obtaining urine Creatinine to calc FeNa  #History of scleroderma/pulmonary fibrosis/rheumatoid arthritis -Appears stable, continue with sulfasalazine -Hold MTX for now given unexplained fevers  #History of COPD. Stable, lungs are clear -Continue nebulized bronchodilators 40m68mnhaled Q4 prn  #GERD -continue Protonix 80 mg daily  FEN/GI: Cardiac diet PPx: Lovenox  Disposition: Home pending clinical improvement  Subjective:  Patient laying comfortably in hospital bed. In no acute distress and no current complaints.  Objective: Temp:  [99.1 F (37.3 C)-100.4 F (38 C)] 99.3 F (37.4 C) (08/17 0439) Pulse Rate:  [83-98] 85 (08/17 1159) Resp:  [18-20] 18 (08/17 1159) BP: (106-137)/(57-79) 106/57 (08/17 1159) SpO2:  [96 %-99 %] 98 % (  08/17 1159) Weight:  [144 lb 1.6 oz (65.4 kg)] 144 lb 1.6 oz (65.4 kg) (08/17 0439) Physical Exam: General: laying  In hospital bed comfortably, in no acute distress Cardiovascular: RRR, no murmurs  appreciated Respiratory: lungs CTA B/L, no wheezing noted Abdomen: soft, NT ND, +bs Extremities: no edema, good cap refill <2 secs, crusting and darkened lesions of shingles noted along right forearm and medial right upper extremity  Laboratory:  Recent Labs Lab 03/10/16 0843 03/11/16 0820 03/12/16 0655  WBC 4.9 5.1 4.7  HGB 9.7* 9.1* 8.8*  HCT 29.1* 27.2* 26.6*  PLT 142* 158 171    Recent Labs Lab 03/09/16 0446 03/10/16 0843 03/11/16 0820 03/12/16 0655  NA 132* 131* 129* 132*  K 3.6 4.2 3.9 4.2  CL 102 101 99* 98*  CO2 23 21* 25 26  BUN <5* <5* 5* 6  CREATININE 0.97 1.04* 0.97 0.89  CALCIUM 8.0* 8.5* 8.3* 8.5*  PROT 6.0*  --   --   --   BILITOT 0.8  --   --   --   ALKPHOS 99  --   --   --   ALT 21  --   --   --   AST 30  --   --   --   GLUCOSE 91 95 97 101*   Imaging/Diagnostic Tests: No results found.  Lovenia Kim, MD 03/12/2016, 12:29 PM PGY-1, [place] Intern pager: [phone], text pages welcome

## 2016-03-12 NOTE — Progress Notes (Signed)
Patient refused bed alarm. Will continue to monitor.  

## 2016-03-12 NOTE — Progress Notes (Signed)
Patient ID: Jocelyn Shelton, female   DOB: [birth year], 60 y.o.   MRN: [id number]          [facility]    Date of Admission:  03/08/2016           Day 6 zoster therapy  Principal Problem:   Fever Active Problems:   Syncope   Shingles   Rheumatoid arthritis (HCC)   CAD in native artery   COPD GOLD 0 still smoking   Scleroderma (HCC)   Hyponatremia   Immunosuppressed status (HCC)   Foot ulcer, right (HCC)   FUO (fever of unknown origin)   . aspirin EC  81 mg Oral Daily  . atorvastatin  40 mg Oral Daily  . dorzolamide-timolol  1 drop Both Eyes BID  . enoxaparin (LOVENOX) injection  40 mg Subcutaneous Q24H  . feeding supplement (ENSURE ENLIVE)  237 mL Oral TID WC  . gabapentin  100 mg Oral BID  . metoprolol  50 mg Oral BID  . multivitamin with minerals  1 tablet Oral Daily  . OLANZapine  5 mg Oral QHS  . pantoprazole  80 mg Oral Daily  . prasugrel  10 mg Oral Daily  . sodium chloride flush  3 mL Intravenous Q12H  . sulfaSALAzine  1,000 mg Oral BID  . valACYclovir  1,000 mg Oral TID    SUBJECTIVE: She is feeling much better. She states that the rash on her right arm is drying up. She is very eager to go on.  Review of Systems: Review of Systems  Constitutional: Negative for chills, diaphoresis and fever.  Respiratory: Negative for cough, sputum production and shortness of breath.   Cardiovascular: Negative for chest pain.  Gastrointestinal: Negative for abdominal pain, diarrhea, nausea and vomiting.  Genitourinary: Negative for dysuria.  Musculoskeletal: Negative for joint pain.  Skin: Positive for rash.    Past Medical History:  Diagnosis Date  . CAD in native artery 05/23/2015  . Hypertension   . Reflux   . Rheumatoid arthritis(714.0)   . S/P angioplasty with stent, 05/22/15 pRCA DES and pLCX DES 05/23/2015  . Scleroderma [facility])     Social History  Substance Use Topics  . Smoking status: Current Every Day Smoker    Packs/day:  0.50    Years: 40.00    Types: Cigarettes  . Smokeless tobacco: Never Used  . Alcohol use 2.4 oz/week    4 Standard drinks or equivalent per week    Family History  Problem Relation Age of Onset  . Hypertension Father   . Heart attack Mother   . Diabetes Daughter   . Diabetes Sister   . Lung cancer Brother 50   Allergies  Allergen Reactions  . Ticagrelor Shortness Of Breath  . Cymbalta [Duloxetine Hcl] Diarrhea and Nausea Only    No appetite, stomach pain  . Aspirin Other (See Comments)    Stomach cramps  . Lisinopril Cough  . Tomato Rash    OBJECTIVE: Vitals:   03/11/16 1336 03/11/16 1953 03/12/16 0439 03/12/16 1159  BP: 118/66 133/68 137/79 (!) 106/57  Pulse: 90 83 98 85  Resp: 20 18 18 18   Temp: 99.1 F (37.3 C) (!) 100.4 F (38 C) 99.3 F (37.4 C)   TempSrc: Oral Oral Oral   SpO2: 98% 96% 99% 98%  Weight:   144 lb 1.6 oz (65.4 kg)   Height:       Body mass index is 23.26 kg/m.  Physical Exam  Constitutional:  She is oriented to person, place, and time.  She is resting quietly in bed watching television.   HENT:  Mouth/Throat: No oropharyngeal exudate.  Eyes: Conjunctivae are normal.  Neck: Neck supple.  Cardiovascular: Normal rate and regular rhythm.   No murmur heard. Pulmonary/Chest: Effort normal and breath sounds normal.  Abdominal: Soft. There is no tenderness.  Musculoskeletal: Normal range of motion. She exhibits no edema or tenderness.  Neurological: She is alert and oriented to person, place, and time.  Skin: Rash noted.  Her right arm shingles lesions continue to the fall and most are crusting now.    Psychiatric: Mood and affect normal.    Lab Results Lab Results  Component Value Date   WBC 4.7 03/12/2016   HGB 8.8 (L) 03/12/2016   HCT 26.6 (L) 03/12/2016   MCV 101.9 (H) 03/12/2016   PLT 171 03/12/2016    Lab Results  Component Value Date   CREATININE 0.89 03/12/2016   BUN 6 03/12/2016   NA 132 (L) 03/12/2016   K 4.2  03/12/2016   CL 98 (L) 03/12/2016   CO2 26 03/12/2016    Lab Results  Component Value Date   ALT 21 03/09/2016   AST 30 03/09/2016   ALKPHOS 99 03/09/2016   BILITOT 0.8 03/09/2016     Microbiology: Recent Results (from the past 240 hour(s))  Urine culture     Status: Abnormal   Collection Time: 03/08/16  2:24 AM  Result Value Ref Range Status   Specimen Description URINE, CLEAN CATCH  Final   Special Requests NONE  Final   Culture MULTIPLE SPECIES PRESENT, SUGGEST RECOLLECTION (A)  Final   Report Status 03/09/2016 FINAL  Final  Culture, blood (routine x 2)     Status: None (Preliminary result)   Collection Time: 03/08/16  9:00 AM  Result Value Ref Range Status   Specimen Description BLOOD RIGHT ANTECUBITAL  Final   Special Requests BOTTLES DRAWN AEROBIC AND ANAEROBIC 10CC  Final   Culture NO GROWTH 4 DAYS  Final   Report Status PENDING  Incomplete  Culture, blood (routine x 2)     Status: None (Preliminary result)   Collection Time: 03/08/16  9:13 AM  Result Value Ref Range Status   Specimen Description BLOOD RIGHT HAND  Final   Special Requests BOTTLES DRAWN AEROBIC ONLY 10CC  Final   Culture NO GROWTH 4 DAYS  Final   Report Status PENDING  Incomplete  MRSA PCR Screening     Status: None   Collection Time: 03/10/16 12:03 PM  Result Value Ref Range Status   MRSA by PCR NEGATIVE NEGATIVE Final    Comment:        The GeneXpert MRSA Assay (FDA approved for NASAL specimens only), is one component of a comprehensive MRSA colonization surveillance program. It is not intended to diagnose MRSA infection nor to guide or monitor treatment for MRSA infections.      ASSESSMENT: She is defervesced seen and there is no evidence of any infection other than her uncomplicated shingles. Her lesions are crusting. She needs another 4 days of valacyclovir and should be ready to go home tomorrow.  PLAN: 1. Valacyclovir for 4 more days  Cliffton Asters, MD [facility] for  Infectious Disease [facility] [phone] pager   [phone] cell 03/12/2016, 4:01 PM

## 2016-03-13 LAB — BASIC METABOLIC PANEL
Anion gap: 7 (ref 5–15)
BUN: 7 mg/dL (ref 6–20)
CALCIUM: 8.5 mg/dL — AB (ref 8.9–10.3)
CO2: 26 mmol/L (ref 22–32)
CREATININE: 0.97 mg/dL (ref 0.44–1.00)
Chloride: 100 mmol/L — ABNORMAL LOW (ref 101–111)
GFR calc non Af Amer: >60 mL/min (ref >60)
GLUCOSE: 88 mg/dL (ref 65–99)
Potassium: 4 mmol/L (ref 3.5–5.1)
Sodium: 133 mmol/L — ABNORMAL LOW (ref 135–145)

## 2016-03-13 LAB — CULTURE, BLOOD (ROUTINE X 2)
CULTURE: NO GROWTH
CULTURE: NO GROWTH

## 2016-03-13 LAB — CBC
HCT: 27.5 % — ABNORMAL LOW (ref 36.0–46.0)
Hemoglobin: 8.9 g/dL — ABNORMAL LOW (ref 12.0–15.0)
MCH: 33.1 pg (ref 26.0–34.0)
MCHC: 32.4 g/dL (ref 30.0–36.0)
MCV: 102.2 fL — ABNORMAL HIGH (ref 78.0–100.0)
PLATELETS: 203 10*3/uL (ref 150–400)
RBC: 2.69 MIL/uL — ABNORMAL LOW (ref 3.87–5.11)
RDW: 13.4 % (ref 11.5–15.5)
WBC: 4.7 10*3/uL (ref 4.0–10.5)

## 2016-03-13 LAB — GLUCOSE, CAPILLARY
Glucose-Capillary: 77 mg/dL (ref 65–99)
Glucose-Capillary: 94 mg/dL (ref 65–99)

## 2016-03-13 MED ORDER — METHOTREXATE 2.5 MG PO TABS: 10.0000 mg | ORAL_TABLET | ORAL | Status: DC

## 2016-03-13 NOTE — Progress Notes (Signed)
Discharge ieinstuctions given . No prescptiond . Verbalized understanding.Awating for dugher for picjk for up

## 2016-03-13 NOTE — Progress Notes (Signed)
Patient ID: Jocelyn Shelton, female   DOB: 1956/07/08, 60 y.o.   MRN: [id number]         [facility]    Date of Admission:  03/08/2016           Day 7 zoster therapy  Principal Problem:   Fever Active Problems:   Syncope   Shingles   Rheumatoid arthritis (HCC)   CAD in native artery   COPD GOLD 0 still smoking   Scleroderma (HCC)   Hyponatremia   Immunosuppressed status (HCC)   Foot ulcer, right (HCC)   FUO (fever of unknown origin)   . aspirin EC  81 mg Oral Daily  . atorvastatin  40 mg Oral Daily  . dorzolamide-timolol  1 drop Both Eyes BID  . enoxaparin (LOVENOX) injection  40 mg Subcutaneous Q24H  . feeding supplement (ENSURE ENLIVE)  237 mL Oral TID WC  . gabapentin  100 mg Oral BID  . metoprolol  50 mg Oral BID  . multivitamin with minerals  1 tablet Oral Daily  . OLANZapine  5 mg Oral QHS  . pantoprazole  80 mg Oral Daily  . prasugrel  10 mg Oral Daily  . sodium chloride flush  3 mL Intravenous Q12H  . sulfaSALAzine  1,000 mg Oral BID  . valACYclovir  1,000 mg Oral TID    SUBJECTIVE: She is feeling much better. She is very eager to go on.  Review of Systems: Review of Systems  Constitutional: Negative for chills, diaphoresis and fever.  Respiratory: Negative for cough, sputum production and shortness of breath.   Cardiovascular: Negative for chest pain.  Gastrointestinal: Negative for abdominal pain, diarrhea, nausea and vomiting.  Genitourinary: Negative for dysuria.  Musculoskeletal: Negative for joint pain.  Skin: Positive for rash.    Past Medical History:  Diagnosis Date  . CAD in native artery 05/23/2015  . Hypertension   . Reflux   . Rheumatoid arthritis(714.0)   . S/P angioplasty with stent, 05/22/15 pRCA DES and pLCX DES 05/23/2015  . Scleroderma [facility])     Social History  Substance Use Topics  . Smoking status: Current Every Day Smoker    Packs/day: 0.50    Years: 40.00    Types: Cigarettes  . Smokeless  tobacco: Never Used  . Alcohol use 2.4 oz/week    4 Standard drinks or equivalent per week    Family History  Problem Relation Age of Onset  . Hypertension Father   . Heart attack Mother   . Diabetes Daughter   . Diabetes Sister   . Lung cancer Brother 50   Allergies  Allergen Reactions  . Ticagrelor Shortness Of Breath  . Cymbalta [Duloxetine Hcl] Diarrhea and Nausea Only    No appetite, stomach pain  . Aspirin Other (See Comments)    Stomach cramps  . Lisinopril Cough  . Tomato Rash    OBJECTIVE: Vitals:   03/12/16 0439 03/12/16 1159 03/12/16 2100 03/13/16 0605  BP: 137/79 (!) 106/57 111/71 139/67  Pulse: 98 85 93 74  Resp: 18 18 18 18   Temp: 99.3 F (37.4 C)  97.8 F (36.6 C) 99.1 F (37.3 C)  TempSrc: Oral  Oral Oral  SpO2: 99% 98% 100% 98%  Weight: 144 lb 1.6 oz (65.4 kg)   143 lb (64.9 kg)  Height:       Body mass index is 23.08 kg/m.  Physical Exam  Constitutional: She is oriented to person, place, and time.  She is  smiling and in very good spirits this morning.  HENT:  Mouth/Throat: No oropharyngeal exudate.  Eyes: Conjunctivae are normal.  Neck: Neck supple.  Musculoskeletal: She exhibits no edema.  Neurological: She is alert and oriented to person, place, and time.  Skin: Rash noted.  Her right arm shingles continues to improve. All lesions are crusted and resolving.    Psychiatric: Mood and affect normal.    Lab Results Lab Results  Component Value Date   WBC 4.7 03/13/2016   HGB 8.9 (L) 03/13/2016   HCT 27.5 (L) 03/13/2016   MCV 102.2 (H) 03/13/2016   PLT 203 03/13/2016    Lab Results  Component Value Date   CREATININE 0.97 03/13/2016   BUN 7 03/13/2016   NA 133 (L) 03/13/2016   K 4.0 03/13/2016   CL 100 (L) 03/13/2016   CO2 26 03/13/2016    Lab Results  Component Value Date   ALT 21 03/09/2016   AST 30 03/09/2016   ALKPHOS 99 03/09/2016   BILITOT 0.8 03/09/2016     Microbiology: Recent Results (from the past 240  hour(s))  Urine culture     Status: Abnormal   Collection Time: 03/08/16  2:24 AM  Result Value Ref Range Status   Specimen Description URINE, CLEAN CATCH  Final   Special Requests NONE  Final   Culture MULTIPLE SPECIES PRESENT, SUGGEST RECOLLECTION (A)  Final   Report Status 03/09/2016 FINAL  Final  Culture, blood (routine x 2)     Status: None (Preliminary result)   Collection Time: 03/08/16  9:00 AM  Result Value Ref Range Status   Specimen Description BLOOD RIGHT ANTECUBITAL  Final   Special Requests BOTTLES DRAWN AEROBIC AND ANAEROBIC 10CC  Final   Culture NO GROWTH 4 DAYS  Final   Report Status PENDING  Incomplete  Culture, blood (routine x 2)     Status: None (Preliminary result)   Collection Time: 03/08/16  9:13 AM  Result Value Ref Range Status   Specimen Description BLOOD RIGHT HAND  Final   Special Requests BOTTLES DRAWN AEROBIC ONLY 10CC  Final   Culture NO GROWTH 4 DAYS  Final   Report Status PENDING  Incomplete  MRSA PCR Screening     Status: None   Collection Time: 03/10/16 12:03 PM  Result Value Ref Range Status   MRSA by PCR NEGATIVE NEGATIVE Final    Comment:        The GeneXpert MRSA Assay (FDA approved for NASAL specimens only), is one component of a comprehensive MRSA colonization surveillance program. It is not intended to diagnose MRSA infection nor to guide or monitor treatment for MRSA infections.      ASSESSMENT: She is now afebrile and her shingles is resolving quickly. I would complete 3 more days of valacyclovir at home. She does not need any special isolation precautions at home and no specific follow-up for this.  PLAN: 1. Valacyclovir for 3 more days 2. I will sign off now but please call if I can be of further assistance  Cliffton Asters, MD [facility] for Infectious Disease [facility] [phone] pager   [phone] cell 03/13/2016, 10:59 AM

## 2016-03-13 NOTE — Discharge Instructions (Signed)
You were admitted to the hospital for an episode of syncope. You were worked up for possible causes as well as treated for your shingles. You developed a fever during your stay and we monitored you for improvement. We are happy you are doing well !   A few things to keep in mind upon discharge: -your HCTZ was held during hospitalization and should be discontinued -Continue to take your Valtrex for shingles for 3 more days and then you can stop this medication. -Your Methotrexate was held during hospitalization but you can now resume it at home -You will need close follow up with rheumatology -Please see your PCP this week for hospital follow up appointment.

## 2016-03-13 NOTE — Progress Notes (Signed)
Family Medicine Teaching Service Daily Progress Note Intern Pager: [phone]  Patient name: Jocelyn Shelton Medical record number: [id number] Date of birth: [birth year] Age: 60 y.o. Gender: female  Primary Care Provider: [place] Bing, DO Consultants: Infectious Disease Code Status: FULL  Pt Overview and Major Events to Date:  Admit by [facility] 8/13 Care by [facility] 8/17  Assessment and Plan: Jocelyn Shelton is a 60 y.o. 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 Patient presented to the ED after an episode of syncope on 8/13, admitted by [facility]. Shingles noted to right forearm, currently on antibiotics. Developed fevers with no obvious etiology starting 8/13. No evidence of infection other than uncomplicated shingles.  #FUO  -with no obvious etiology, unsure if this fever can be explained by shingles -Tmax 103.4 on 8/14, fevers since 8/12 -Bcx Neg x 3 days so far -CT scan of the chest and abdomen negative for any infectious etiology  -ID following, will continue to provide supportive care -HIV nonreactive -CRP elevated at 6.1   ESR elevated at 50. -fever curve continues to improve. 100.4 around 7am on 8/16. Afebrile overnight. Pt states is feeling well.   #Herpes zoster to right shoulder and right hand -Most lesions now crusting and falling , still some vesicles remain -previously on IV acyclovir, transitioned to oral Valtrex. Per recommendations of ID to continue 3 more days from today.  #Syncope -Likely vasovagal, orthostatic vitals were negative -Telemetry negative so far - Echo 8/15 showing preserved EF 60-65%.  -Troponins negative. Further workup may not be needed at this time.  #History of CAD-status post PCI October 2016 Stable. -Pt without any chest pain or shortness of breath -EKG negative, troponins Nx3 -Continue aspirin 38m, Effient (Prasugrel) 1379mdaily ,  metoprolol 50 mg BID and Lipitor 40 mg daily  #Acute on chronic hyponatremia: Presented with a sodium of 122 on admission, improved with hydration. BMET this am sodium 133, Cr 0.97 -Appears to have mild chronic hyponatremia at baseline -euvolemic on exam  #History of scleroderma/pulmonary fibrosis/rheumatoid arthritis -Appears stable, continue with sulfasalazine -Hold MTX for now given unexplained fevers  -Can resume MTX on discharge  #History of COPD. Stable, lungs are clear -Continue nebulized bronchodilators 79m40mnhaled Q4 prn  #GERD -continue Protonix 80 mg daily  FEN/GI: Cardiac diet PPx: Lovenox  Disposition: Likely dc home today, stable. No obvious source of fevers and fever curve improving.  Subjective:  Patient laying comfortably in hospital bed. In no acute distress and no current complaints, anxious to go home with her daughter. Daughter at bedside.  Objective: Temp:  [97.8 F (36.6 C)-99.1 F (37.3 C)] 98.7 F (37.1 C) (08/18 1400) Pulse Rate:  [74-93] 81 (08/18 1400) Resp:  [18] 18 (08/18 1400) BP: (111-139)/(67-72) 115/72 (08/18 1400) SpO2:  [98 %-100 %] 98 % (08/18 1400) Weight:  [143 lb (64.9 kg)] 143 lb (64.9 kg) (08/18 [id number] Exam: General: laying  In hospital bed comfortably, in no acute distress Cardiovascular: RRR, no murmurs appreciated Respiratory: lungs CTA B/L, no wheezing noted Abdomen: soft, NT ND, +bs Extremities: no edema, good cap refill <2 secs, dry and crusting lesions noted along right forearm and medial right upper extremity  Laboratory:  Recent Labs Lab 03/11/16 0820 03/12/16 0655 03/13/16 0207  WBC 5.1 4.7 4.7  HGB 9.1* 8.8* 8.9*  HCT 27.2* 26.6* 27.5*  PLT 158 171 203  Recent Labs Lab 03/09/16 0446  03/11/16 0820 03/12/16 0655 03/13/16 0207  NA 132*  < > 129* 132* 133*  K 3.6  < > 3.9 4.2 4.0  CL 102  < > 99* 98* 100*  CO2 23  < > '25 26 26  ' BUN <5*  < > 5* 6 7  CREATININE 0.97  < > 0.97 0.89 0.97   CALCIUM 8.0*  < > 8.3* 8.5* 8.5*  PROT 6.0*  --   --   --   --   BILITOT 0.8  --   --   --   --   ALKPHOS 99  --   --   --   --   ALT 21  --   --   --   --   AST 30  --   --   --   --   GLUCOSE 91  < > 97 101* 88  < > = values in this interval not displayed.   Imaging/Diagnostic Tests: No results found.  Lovenia Kim, MD 03/13/2016, 4:06 PM PGY-1, [place] Intern pager: [phone], text pages welcome

## 2016-03-14 NOTE — Discharge Summary (Signed)
[facility] Discharge Summary  Patient name: Jocelyn Shelton Medical record number: [id number] Date of birth: 06/09/1956 Age: 60 y.o. Gender: female Date of Admission: 03/08/2016  Date of Discharge: 03/13/2016 Admitting Physician: Etta Quill, DO  Primary Care Provider: [place] Bing, DO Consultants: Infectious Disease  Indication for Hospitalization: Syncope, fever of unknown origin  Discharge Diagnoses/Problem List:  Syncope RA CAD Scleroderma COPD Hyponatremia Shingles Fever of unknown origin  Disposition: Discharge home  Discharge Condition: Improved, stable  Discharge Exam:  General: laying  In hospital bed comfortably, in no acute distress Cardiovascular: RRR, no murmurs appreciated Respiratory: lungs CTA B/L, no wheezing noted Abdomen: soft, NT ND, +bs Extremities: no edema, good cap refill <2 secs, dry and crusting lesions noted along right forearm and medial right upper extremity  Brief Hospital Course:  60 y.o.femalewith medical history significant of Scleroderma, RA, COPD, CAD with MI in October of last year s/p 2 stents, mild pulm HTN on echo in July last year. Patient presented to the ED after an episode of syncope on 8/13. She was workup up for her syncopal episode and developed fevers with no obvious etiology starting 8/13. No evidence of infection other than uncomplicated shingles on right forearm and right upper arm just outside axilla.   #FUO  Fevers with no obvious etiology. Blood cultures were obtained and found to be negative. CT scan C/A negative for any infectious etiology. ID consulted for recommendations. HIV found nonreactive, CRP and ESR both elevated. Her home MTX was held given her fevers, and may be resumed upon discharge. Her fever curve continued to improve and patient remained afebrile towards the end of her hospital stay. At time of discharge, she was stable and back to her baseline.   #Herpes zoster to right  shoulder and right hand Patient also complaining of resolving shingles rash. Was on IV acyclovir at beginning of hospital stay and transitioned to po Valtrex. Per rec of ID she will continue to be on 3 more days of Valtrex upon discharge.   #Syncope Syncopal episode likely to be vasovagal. Orthostatic vitals negative. No cardiac etiology found via workup.   #History of CAD-status post PCI October 2016 Stable. Pt without any chest pain or shortness of breath during this hospital admission. Workup for cardiac etiology for syncope negative. Home meds were continued.   #History of scleroderma/pulmonary fibrosis/rheumatoid arthritis Stable, continued with sulfasalazine. MTX held during hospitalization given unexplained fevers, and pt instructed she can resume on discharge.  Issues for Follow Up:  - HCTZ was held during hospitalization and should be discontinued upon d/c -Patient to take Valtrex for 3 more days and then stop this medication. - Methotrexate was held during hospitalization but patient can now resume it at home -Patient will need close follow up with rheumatology  Significant Procedures: None  Significant Labs and Imaging:   Recent Labs Lab 03/11/16 0820 03/12/16 0655 03/13/16 0207  WBC 5.1 4.7 4.7  HGB 9.1* 8.8* 8.9*  HCT 27.2* 26.6* 27.5*  PLT 158 171 203    Recent Labs Lab 03/08/16 0025  03/09/16 0446 03/10/16 0843 03/11/16 0820 03/12/16 0655 03/13/16 0207  NA 122*  < > 132* 131* 129* 132* 133*  K 3.6  < > 3.6 4.2 3.9 4.2 4.0  CL 91*  < > 102 101 99* 98* 100*  CO2 22  < > 23 21* '25 26 26  ' GLUCOSE 94  < > 91 95 97 101* 88  BUN 8  < > <  5* <5* 5* 6 7  CREATININE 1.07*  < > 0.97 1.04* 0.97 0.89 0.97  CALCIUM 8.6*  < > 8.0* 8.5* 8.3* 8.5* 8.5*  MG 1.6*  --  1.8 1.6* 1.9  --   --   ALKPHOS  --   --  99  --   --   --   --   AST  --   --  30  --   --   --   --   ALT  --   --  21  --   --   --   --   ALBUMIN  --   --  3.0*  --   --   --   --   < > = values  in this interval not displayed.  Results/Tests Pending at Time of Discharge: None  Discharge Medications:    Medication List    STOP taking these medications   Black Cohosh 40 MG Caps   triamterene-hydrochlorothiazide 75-50 MG tablet Commonly known as:  MAXZIDE     TAKE these medications   acetaminophen 500 MG tablet Commonly known as:  TYLENOL Take 1,000 mg by mouth daily as needed for pain.   albuterol 108 (90 Base) MCG/ACT inhaler Commonly known as:  PROVENTIL HFA;VENTOLIN HFA Inhale 2 puffs into the lungs every 4 (four) hours as needed for wheezing or shortness of breath (cough).   aspirin 81 MG EC tablet Take 1 tablet (81 mg total) by mouth daily.   atorvastatin 40 MG tablet Commonly known as:  LIPITOR Take 40 mg by mouth daily.   ATROVENT HFA 17 MCG/ACT inhaler Generic drug:  ipratropium INHALE TWO PUFFS BY MOUTH ONCE DAILY AS NEEDED FOR WHEEZING   dorzolamide-timolol 22.3-6.8 MG/ML ophthalmic solution Commonly known as:  COSOPT Place 1 drop into both eyes 2 (two) times daily.   EFFIENT 10 MG Tabs tablet Generic drug:  prasugrel TAKE ONE TABLET BY MOUTH DAILY What changed:  See the new instructions.   famotidine 20 MG tablet Commonly known as:  PEPCID One at bedtime What changed:  how much to take  how to take this  when to take this  additional instructions   fluticasone 50 MCG/ACT nasal spray Commonly known as:  FLONASE USE TWO SPRAY(S) IN EACH NOSTRIL DAILY AS NEEDED FOR  RHINITIS   folic acid 1 MG tablet Commonly known as:  FOLVITE TAKE 1 TABLET (1 MG TOTAL) BY MOUTH DAILY.   methotrexate 2.5 MG tablet Commonly known as:  RHEUMATREX Take 4 tablets (10 mg total) by mouth 2 (two) times a week. Take 5 tablets every Mon and Tues. Caution:Chemotherapy. Protect from light. Start taking on:  03/16/2016 What changed:  how much to take Notes to patient:  4 tabns a week  Mon and Tues =5vtablets chemotheradrug  protect from llifgt    metoprolol  50 MG tablet Commonly known as:  LOPRESSOR Take 1 tablet (50 mg total) by mouth 2 (two) times daily.   multivitamin with minerals Tabs tablet Take 1 tablet by mouth daily.   nitroGLYCERIN 0.4 MG SL tablet Commonly known as:  NITROSTAT Place 0.4 mg under the tongue every 5 (five) minutes as needed for chest pain (x 3pills daily).   OLANZapine 5 MG tablet Commonly known as:  ZYPREXA TAKE ONE TABLET BY MOUTH AT BEDTIME What changed:  See the new instructions.   omeprazole 40 MG capsule Commonly known as:  PRILOSEC TAKE ONE CAPSULE BY MOUTH ONCE DAILY What changed:  See  the new instructions.   sulfaSALAzine 500 MG tablet Commonly known as:  AZULFIDINE Take 1,000 mg by mouth 2 (two) times daily.   valACYclovir 1000 MG tablet Commonly known as:  VALTREX Take 1 tablet (1,000 mg total) by mouth 3 (three) times daily.      Discharge Instructions: Please refer to Patient Instructions section of EMR for full details.  Patient was counseled important signs and symptoms that should prompt return to medical care, changes in medications, dietary instructions, activity restrictions, and follow up appointments.   Follow-Up Appointments: Follow-up Information    Call [place] Bing, DO.   Why:  Please call to make a follow-up appointment with your PCP Dr. Yisroel Ramming this week at a time that would be convenient   Contact information: [place] [id number] [phone]          Lovenia Kim, MD 03/14/2016, 3:35 PM PGY-1, [place]

## 2016-03-24 ENCOUNTER — Ambulatory Visit (INDEPENDENT_AMBULATORY_CARE_PROVIDER_SITE_OTHER): Payer: Medicaid Other | Admitting: Family Medicine

## 2016-03-24 ENCOUNTER — Telehealth: Payer: Self-pay | Admitting: *Deleted

## 2016-03-24 VITALS — BP 158/84 | HR 91 | Temp 98.2°F | Wt 148.0 lb

## 2016-03-24 DIAGNOSIS — I10 Essential (primary) hypertension: Secondary | ICD-10-CM | POA: Diagnosis not present

## 2016-03-24 DIAGNOSIS — B029 Zoster without complications: Secondary | ICD-10-CM

## 2016-03-24 MED ORDER — GABAPENTIN 100 MG PO CAPS: 100.0000 mg | ORAL_CAPSULE | Freq: Three times a day (TID) | ORAL | 2 refills | Status: DC

## 2016-03-24 NOTE — Telephone Encounter (Signed)
Patient daughter called stating that patient was seen today by Dr. Wonda Olds and prescribed gabapentin. Patient daughter states that patient has had that before and it does not work. Patient daughter states that patient has not been sleeping because of the pain and says patient needs someting different prescribed.

## 2016-03-24 NOTE — Assessment & Plan Note (Signed)
  BP initially high at 169/76, recheck was 158/84, patient did not take meds this am  -no changes at this time, patient's hctz was discontinued during hospitalization -patient to continue metoprolol 50 mg bid, will take medicine when she gets home -follow up with PCP in 2-3 weeks, patient told to bring medications with her

## 2016-03-24 NOTE — Progress Notes (Signed)
    Subjective:    Patient ID: Jocelyn Shelton, female    DOB: 1955-09-06, 60 y.o.   MRN: [id number]   CC: shingles pain & hospital follow up for syncope  HPI: Was discharged from hospital on 8/18, has been doing well. Denies further episodes of syncope. Has restarted taking methotrexate and has follow up with rheumatology scheduled in November. Feels well other than shingles pain. Shingles rash developed about 2 weeks ago while in hospital. She completed a course of valtrex and the rash is healing nicely but she has severe pain in her right arm where the rash was. It is keeping her from sleeping at night. The pain is a 10/10 and is sharp and burning. Hurts with even light touch. Denies radiation. Pain is constant throughout day. Denies fevers, chills, nausea, vomiting, diarrhea.   BP is high today upon arrival but patient did not take any morning meds yet because she was running late to the appointment. Will take them as soon as she gets home. No chest pain or shortness of breath.  Smoking status reviewed- current every day smoker  Review of Systems- see HPI    Objective:  BP (!) 158/84   Pulse 91   Temp 98.2 F (36.8 C) (Oral)   Wt 148 lb (67.1 kg)   BMI 23.89 kg/m  Vitals and nursing note reviewed  General: well nourished, in NAD Cardiac: RRR, clear S1 and S2, no murmurs, rubs, or gallops Respiratory: clear to auscultation bilaterally, no increased work of breathing Extremities: no edema or cyanosis. Warm, well perfused.  Skin: warm and dry, healing shingles rash from right axilla down to wrist, no erythema, blood, or discharge noted. Painful to touch. Neuro: alert and oriented, no focal deficits   Assessment & Plan:    Shingles  Rash healing well, s/p course of Valtrex  -will give gabapentin 100mg  to take once today, twice tomorrow, three times a day the next day and continue doing three times a day if medication does not cause drowsiness. Can increase to 300mg  three  times a day if tolerated well -follow up with PCP in 2-3 weeks to make sure pain is well controlled -return sooner if pain worsens/not controlled  HYPERTENSION, BENIGN SYSTEMIC  BP initially high at 169/76, recheck was 158/84, patient did not take meds this am  -no changes at this time, patient's hctz was discontinued during hospitalization -patient to continue metoprolol 50 mg bid, will take medicine when she gets home -follow up with PCP in 2-3 weeks, patient told to bring medications with her    , DO Family Medicine Resident PGY-1

## 2016-03-24 NOTE — Patient Instructions (Signed)
  Please make follow up appointment with Dr. Abelardo Diesel (PCP) in about 2-3 weeks.  Please pick up new prescription, gabapentin, and take one pill today, one pill twice tomorrow, and one pill three times a day the third day. Work your way up slowly to taking this medicine three times a day because it may make you drowsy.  Call if you have questions or concerns.

## 2016-03-24 NOTE — Assessment & Plan Note (Addendum)
  Rash healing well, s/p course of Valtrex. Patient has continued pain in dermatome affected by rash.   -will give gabapentin 100mg  to take once today, twice tomorrow, three times a day the next day and continue doing three times a day if medication does not cause drowsiness. Can increase to 300mg  three times a day if tolerated well -follow up with PCP in 2-3 weeks to make sure pain is well controlled -return sooner if pain worsens/not controlled

## 2016-03-24 NOTE — Telephone Encounter (Signed)
Tried to call Jocelyn Shelton. Daughter was not at visit, uncomfortable speaking with her without Ms. Beamon's permission. Medicine prescribed is specifically for shingles pain and is very effective. Would like Ms. Ressler to try this first. Please relay if Ms. Bachmeier calls back.

## 2016-03-24 NOTE — Telephone Encounter (Signed)
Will forward to MD to advise. Dean Wonder,CMA  

## 2016-03-26 ENCOUNTER — Other Ambulatory Visit: Payer: Self-pay | Admitting: Family Medicine

## 2016-03-26 DIAGNOSIS — R05 Cough: Secondary | ICD-10-CM

## 2016-03-26 DIAGNOSIS — J449 Chronic obstructive pulmonary disease, unspecified: Secondary | ICD-10-CM

## 2016-03-26 DIAGNOSIS — R058 Other specified cough: Secondary | ICD-10-CM

## 2016-04-02 ENCOUNTER — Other Ambulatory Visit: Payer: Self-pay | Admitting: Family Medicine

## 2016-04-02 DIAGNOSIS — R058 Other specified cough: Secondary | ICD-10-CM

## 2016-04-02 DIAGNOSIS — J449 Chronic obstructive pulmonary disease, unspecified: Secondary | ICD-10-CM

## 2016-04-02 DIAGNOSIS — R05 Cough: Secondary | ICD-10-CM

## 2016-04-02 NOTE — Telephone Encounter (Signed)
Daughter called because her mother is out of 2 of her medications. She said that the pharmacy has requested this a couple of times and now she is out. Can we call these in today. Atrovent and Albuterol. jw

## 2016-04-14 ENCOUNTER — Ambulatory Visit: Payer: Medicaid Other | Admitting: Family Medicine

## 2016-05-25 ENCOUNTER — Other Ambulatory Visit: Payer: Self-pay | Admitting: Physician Assistant

## 2016-05-25 ENCOUNTER — Other Ambulatory Visit: Payer: Self-pay | Admitting: Family Medicine

## 2016-05-25 DIAGNOSIS — I10 Essential (primary) hypertension: Secondary | ICD-10-CM

## 2016-05-25 DIAGNOSIS — F339 Major depressive disorder, recurrent, unspecified: Secondary | ICD-10-CM

## 2016-05-25 DIAGNOSIS — K219 Gastro-esophageal reflux disease without esophagitis: Secondary | ICD-10-CM

## 2016-06-04 ENCOUNTER — Other Ambulatory Visit: Payer: Self-pay | Admitting: *Deleted

## 2016-06-04 MED ORDER — OMEPRAZOLE 40 MG PO CPDR: 40.0000 mg | DELAYED_RELEASE_CAPSULE | Freq: Every day | ORAL | 1 refills | Status: DC

## 2016-06-12 ENCOUNTER — Other Ambulatory Visit: Payer: Self-pay | Admitting: Physician Assistant

## 2016-06-24 ENCOUNTER — Other Ambulatory Visit: Payer: Self-pay | Admitting: Family Medicine

## 2016-06-24 ENCOUNTER — Other Ambulatory Visit: Payer: Self-pay | Admitting: Physician Assistant

## 2016-06-24 DIAGNOSIS — F339 Major depressive disorder, recurrent, unspecified: Secondary | ICD-10-CM

## 2016-06-25 ENCOUNTER — Telehealth: Payer: Self-pay | Admitting: Internal Medicine

## 2016-06-25 DIAGNOSIS — I10 Essential (primary) hypertension: Secondary | ICD-10-CM

## 2016-06-25 MED ORDER — METOPROLOL TARTRATE 50 MG PO TABS: 50.0000 mg | ORAL_TABLET | Freq: Two times a day (BID) | ORAL | 0 refills | Status: DC

## 2016-06-25 MED ORDER — PRASUGREL HCL 10 MG PO TABS: 10.0000 mg | ORAL_TABLET | Freq: Every day | ORAL | 0 refills | Status: DC

## 2016-06-25 MED ORDER — ATORVASTATIN CALCIUM 40 MG PO TABS: 40.0000 mg | ORAL_TABLET | Freq: Every day | ORAL | 0 refills | Status: DC

## 2016-06-25 NOTE — Telephone Encounter (Signed)
Pt's Rx was sent to pt's pharmacy as requested. Confirmation received.  °

## 2016-06-25 NOTE — Telephone Encounter (Signed)
New Message   *STAT* If patient is at the pharmacy, call can be transferred to refill team.   1. Which medications need to be refilled? (please list name of each medication and dose if known)  Atorvastatin 40 mg tablet by mouth daily at 6 pm-30 day supply Metoprolol 50 mg tablet by mouth twice daily totaling 50 mg-90 day supply Effient 10 mg tablet by mouth daily-30 day supply  2. Which pharmacy/location (including street and city if local pharmacy) is medication to be sent to? [facility] pharmacy [phone], [address] [place], [place], [place]  3. Do they need a 30 day or 90 day supply?  A-30 day supply M-90 day supply E-30 day supply

## 2016-06-29 ENCOUNTER — Other Ambulatory Visit: Payer: Self-pay | Admitting: Family Medicine

## 2016-06-29 MED ORDER — OLANZAPINE 5 MG PO TABS: 5.0000 mg | ORAL_TABLET | Freq: Every day | ORAL | 5 refills | Status: DC

## 2016-06-29 NOTE — Telephone Encounter (Signed)
Pt needs a refill on Zyprexa. Pt uses [facility] @ [place]. Please advise. Thanks! ep

## 2016-07-01 ENCOUNTER — Encounter: Payer: Self-pay | Admitting: Physician Assistant

## 2016-07-01 ENCOUNTER — Ambulatory Visit (INDEPENDENT_AMBULATORY_CARE_PROVIDER_SITE_OTHER): Payer: Medicaid Other | Admitting: Physician Assistant

## 2016-07-01 VITALS — BP 170/96 | HR 74 | Ht 66.0 in | Wt 139.0 lb

## 2016-07-01 DIAGNOSIS — I2510 Atherosclerotic heart disease of native coronary artery without angina pectoris: Secondary | ICD-10-CM

## 2016-07-01 DIAGNOSIS — J449 Chronic obstructive pulmonary disease, unspecified: Secondary | ICD-10-CM | POA: Diagnosis not present

## 2016-07-01 DIAGNOSIS — E785 Hyperlipidemia, unspecified: Secondary | ICD-10-CM

## 2016-07-01 DIAGNOSIS — M79604 Pain in right leg: Secondary | ICD-10-CM

## 2016-07-01 DIAGNOSIS — I10 Essential (primary) hypertension: Secondary | ICD-10-CM

## 2016-07-01 DIAGNOSIS — M349 Systemic sclerosis, unspecified: Secondary | ICD-10-CM

## 2016-07-01 MED ORDER — ATORVASTATIN CALCIUM 40 MG PO TABS: 40.0000 mg | ORAL_TABLET | Freq: Every day | ORAL | 3 refills | Status: DC

## 2016-07-01 MED ORDER — VALSARTAN 80 MG PO TABS: 80.0000 mg | ORAL_TABLET | Freq: Every day | ORAL | 3 refills | Status: DC

## 2016-07-01 MED ORDER — METOPROLOL TARTRATE 50 MG PO TABS: 50.0000 mg | ORAL_TABLET | Freq: Two times a day (BID) | ORAL | 3 refills | Status: DC

## 2016-07-01 MED ORDER — CLOPIDOGREL BISULFATE 75 MG PO TABS: 75.0000 mg | ORAL_TABLET | Freq: Every day | ORAL | 3 refills | Status: DC

## 2016-07-01 NOTE — Progress Notes (Signed)
Cardiology Office Note:    Date:  07/01/2016   ID:  Jocelyn Shelton, DOB [birth year], MRN [id number]  PCP:  Wendee Beavers, DO  Cardiologist:  Dr. Dietrich Pates   Electrophysiologist:  n/a  Referring MD: Wendee Beavers, DO   Chief Complaint  Patient presents with  . Follow-up    CAD    History of Present Illness:    Jocelyn Shelton is a 60 y.o. female with a hx of CAD, tobacco abuse, HTN, scleroderma, rheumatoid arthritis.  She was admitted in 10/16 with NSTEMI in the setting of hypertensive urgency and L lingular pneumonia on CXR.  Of note, she required thoracentesis for pleural effusions during admission in 7/16.  LHC in 10/16 demonstrated 2 v CAD treated with DES to RCA and DES to LCx.  She was intol to Brilinta 2/2 to shortness of breath and was changed to Effient.  Last seen by Nada Boozer, NP in 11/16.    She had persistent cough after DC and her ACE inhibitor was DC'd.  FU CXR showed persistent L lingula density and Chest CT demonstrated suspicious findings for interstitial lung dz. She was referred to Pulmonology.  It was not felt that she had symptoms related to ILD or airways disease.  She has mild obstructive airways disease on PFTs.  Last rec from Pulmonology was to consider GI referral.     She was admitted in 8/17 with vasovagal syncope in the setting of fever of unknown origin.  Echo demonstrated normal LVEF at that time.    She is here today with her daughter. She continues to smoke cigarettes. She is fairly sedentary. She does not do much activity at all. She has calluses on the bottom of her feet which are painful and limits her mobility. She does note some right lower extremity pain with walking. She continues to have a significant cough which causes dyspnea. Otherwise, she denies dyspnea with exertion. She denies chest discomfort. She has worsening cough with lying on her back. She denies PND. She has chronic ankle edema that is unchanged. She denies syncope.  Prior CV  studies that were reviewed today include:    Echo 03/10/16 Mild focal basal septal hypertrophy, EF 60-65, no RWMA, Gr 1 DD, trivial MR, mild TR, PASP 48  LHC 05/22/15 LCx proximal 80, distal 30 RCA proximal 80, RPDA 50 EF 55 with mild posterior basal HK and mild posterior lateral hypo contractility  PCI: Angiosculpt balloon +3 x 12 mm Synergy DES to the proximal LCx; 3.5 x 16 mm Synergy DES to the proximal RCA  Echo 7/16 Mild focal basal septal hypertrophy, EF 60-65, normal wall motion, grade 1 diastolic dysfunction, mild MR, mild to moderate TR, PASP 43  Past Medical History:  Diagnosis Date  . CAD in native artery 05/23/2015  . Hypertension   . Reflux   . Rheumatoid arthritis(714.0)   . S/P angioplasty with stent, 05/22/15 pRCA DES and pLCX DES 05/23/2015  . Scleroderma [facility])     Past Surgical History:  Procedure Laterality Date  . APPENDECTOMY  90's  . CARDIAC CATHETERIZATION N/A 05/22/2015   Procedure: Left Heart Cath and Coronary Angiography;  Surgeon: Lennette Bihari, MD;  Location: [facility] INVASIVE CV LAB;  Service: Cardiovascular;  Laterality: N/A;  . CARDIAC CATHETERIZATION N/A 05/22/2015   Procedure: Coronary Stent Intervention;  Surgeon: Lennette Bihari, MD;  Location: [facility] INVASIVE CV LAB;  Service: Cardiovascular;  Laterality: N/A;    Current Medications: Current Meds  Medication  Sig  . acetaminophen (TYLENOL) 500 MG tablet Take 1,000 mg by mouth daily as needed for pain.   Marland Kitchen aspirin EC 81 MG EC tablet Take 1 tablet (81 mg total) by mouth daily.  Marland Kitchen atorvastatin (LIPITOR) 40 MG tablet Take 1 tablet (40 mg total) by mouth daily at 6 PM.  . ATROVENT HFA 17 MCG/ACT inhaler INHALE TWO PUFFS BY MOUTH ONCE DAILY AS NEEDED FOR WHEEZING  . dorzolamide-timolol (COSOPT) 22.3-6.8 MG/ML ophthalmic solution Place 1 drop into both eyes 2 (two) times daily.  . famotidine (PEPCID) 20 MG tablet Take 20 mg by mouth at bedtime.   . fluticasone (FLONASE) 50 MCG/ACT nasal spray USE TWO  SPRAY(S) IN EACH NOSTRIL DAILY AS NEEDED FOR  RHINITIS  . folic acid (FOLVITE) 1 MG tablet TAKE 1 TABLET (1 MG TOTAL) BY MOUTH DAILY.  Marland Kitchen gabapentin (NEURONTIN) 100 MG capsule Take 1 capsule (100 mg total) by mouth 3 (three) times daily.  . methotrexate (RHEUMATREX) 2.5 MG tablet Take 4 tablets (10 mg total) by mouth 2 (two) times a week. Take 5 tablets every Mon and Tues. Caution:Chemotherapy. Protect from light.  . metoprolol (LOPRESSOR) 50 MG tablet Take 1 tablet (50 mg total) by mouth 2 (two) times daily.  . Multiple Vitamin (MULTIVITAMIN WITH MINERALS) TABS tablet Take 1 tablet by mouth daily.  Marland Kitchen OLANZapine (ZYPREXA) 5 MG tablet Take 1 tablet (5 mg total) by mouth at bedtime.  Marland Kitchen omeprazole (PRILOSEC) 40 MG capsule Take 1 capsule (40 mg total) by mouth daily.  . prasugrel (EFFIENT) 10 MG TABS tablet Take 1 tablet (10 mg total) by mouth daily.  Marland Kitchen PROVENTIL HFA 108 (90 Base) MCG/ACT inhaler INHALE TWO PUFFS BY MOUTH EVERY 4 HOURS AS NEEDED FOR WHEEZING OR SHORTNESS OF BREATH (COUGH)  . sulfaSALAzine (AZULFIDINE) 500 MG tablet Take 1,000 mg by mouth 2 (two) times daily.  . [DISCONTINUED] atorvastatin (LIPITOR) 40 MG tablet Take 1 tablet (40 mg total) by mouth daily at 6 PM.  . [DISCONTINUED] atorvastatin (LIPITOR) 40 MG tablet Take 1 tablet (40 mg total) by mouth daily at 6 PM.  . [DISCONTINUED] metoprolol (LOPRESSOR) 50 MG tablet Take 1 tablet (50 mg total) by mouth 2 (two) times daily.  . [DISCONTINUED] metoprolol (LOPRESSOR) 50 MG tablet Take 1 tablet (50 mg total) by mouth 2 (two) times daily.  . [DISCONTINUED] nitroGLYCERIN (NITROSTAT) 0.4 MG SL tablet Place 0.4 mg under the tongue every 5 (five) minutes as needed for chest pain (x 3pills daily).  . [DISCONTINUED] nitroGLYCERIN (NITROSTAT) 0.4 MG SL tablet PLACE ONE TABLET UNDER THE TONGUE EVERY 5 MINUTES AS NEEDED FOR CHEST PAIN     Allergies:   Ticagrelor; Cymbalta [duloxetine hcl]; Aspirin; Lisinopril; and Tomato   Social History    Social History  . Marital status: Single    Spouse name: N/A  . Number of children: 1  . Years of education: N/A   Social History Main Topics  . Smoking status: Current Every Day Smoker    Packs/day: 0.50    Years: 40.00    Types: Cigarettes  . Smokeless tobacco: Never Used  . Alcohol use 2.4 oz/week    4 Standard drinks or equivalent per week  . Drug use: No  . Sexual activity: Not Asked   Other Topics Concern  . None   Social History Narrative  . None     Family History:  The patient's family history includes Diabetes in her daughter and sister; Heart attack in her mother; Hypertension in  her father; Lung cancer (age of onset: 72) in her brother.   ROS:   Please see the history of present illness.    Review of Systems  Constitution: Positive for chills, decreased appetite, diaphoresis, malaise/fatigue and weight loss.  Eyes: Positive for visual disturbance.  Cardiovascular: Positive for dyspnea on exertion, irregular heartbeat and leg swelling.  Respiratory: Positive for cough, shortness of breath, snoring and wheezing.   Musculoskeletal: Positive for joint pain, joint swelling and myalgias.  Gastrointestinal: Positive for nausea and vomiting.  Neurological: Positive for loss of balance.   All other systems reviewed and are negative.   EKGs/Labs/Other Test Reviewed:    EKG:  EKG is  ordered today.  The ekg ordered today demonstrates NSR, HR 74, normal axis, RSR prime V1-V2, QTc 450 ms, no acute ST changes  Recent Labs: 01/02/2016: Pro B Natriuretic peptide (BNP) 160.0 03/09/2016: ALT 21 03/11/2016: Magnesium 1.9; TSH 3.625 03/13/2016: BUN 7; Creatinine, Ser 0.97; Hemoglobin 8.9; Platelets 203; Potassium 4.0; Sodium 133   Recent Lipid Panel    Component Value Date/Time   CHOL 98 (L) 07/12/2015 0941   TRIG 60 07/12/2015 0941   HDL 52 07/12/2015 0941   CHOLHDL 1.9 07/12/2015 0941   VLDL 12 07/12/2015 0941   LDLCALC 34 07/12/2015 0941     Physical Exam:     VS:  BP (!) 170/96   Pulse 74   Ht 5\' 6"  (1.676 m)   Wt 139 lb (63 kg)   BMI 22.44 kg/m     Wt Readings from Last 3 Encounters:  07/01/16 139 lb (63 kg)  03/24/16 148 lb (67.1 kg)  03/13/16 143 lb (64.9 kg)     Physical Exam  Constitutional: She is oriented to person, place, and time. She appears well-developed and well-nourished. No distress.  HENT:  Head: Normocephalic and atraumatic.  Eyes: No scleral icterus.  Neck: No JVD present. Carotid bruit is not present.  Cardiovascular: Normal rate, regular rhythm, S1 normal and S2 normal.  Exam reveals distant heart sounds and decreased pulses.   No murmur heard. Pulses:      Dorsalis pedis pulses are 1+ on the right side, and 1+ on the left side.       Posterior tibial pulses are 0 on the right side, and 0 on the left side.  Pulmonary/Chest: She has decreased breath sounds. She has no wheezes. She has no rhonchi. She has no rales.  Abdominal: Soft. There is no tenderness.  Musculoskeletal: She exhibits no edema.  Neurological: She is alert and oriented to person, place, and time.  Skin: Skin is warm and dry.  Large calluses noted on bottom of R foot  Psychiatric: She has a normal mood and affect.    ASSESSMENT:    1. Coronary artery disease involving native coronary artery of native heart without angina pectoris   2. HYPERTENSION, BENIGN SYSTEMIC   3. Hyperlipidemia LDL goal <70   4. COPD GOLD 0 still smoking   5. Pain of right lower extremity   6. Scleroderma (HCC)    PLAN:    In order of problems listed above:  1. CAD - hx of NSTEMI in 10/16 tx with DES to RCA and DES to LCx.  EF was preserved.  She denies symptoms of angina. She is greater than 1 year from her ACS. Given her multiple stents and ongoing tobacco abuse, I think she would benefit from prolonged dual antiplatelet therapy.  -  DC Effient  -  Start Plavix  75 mg daily  -  Continue ASA, statin, beta-blocker.     2. HTN - BP is uncontrolled.  Her daughter  checks her BP at home.  Her BP tends to increase in the clinic but it is 140-150s at home.    -  Continue Metoprolol Tartrate  -  Start Diovan 80 mg QD  -  CMET in 1-2 weeks  3. HL - LDL in 12/16 was 34.  Continue Lipitor 40.  Arrange CMET, Lipids in 1-2 weeks.  4. COPD - Mild COPD and question of ILD by CT.  She was seen by pulmonology but symptoms were not felt to be related to ILD.  She should FU with PCP for management of her cough.  Quitting smoking is key. I have urged her to quit.  We discussed the importance of her deciding that she wants to quit and strategies for quitting.  5. Leg pain - Symptoms are s/w atypical for claudication.  But, she has a lot of RFs for PAD.  Pedal pulses are s/w diminished.  -  Arrange ABIs/LE arterial duplex  6. Scleroderma - She has a lot of calluses on her R foot.  I am not certain if this is related to her scleroderma.  She should FU with her PCP.  Her prior Rheumatologist, Dr. Kellie Simmering, no longer sees her due to Medicaid.  She should request referral to another Rheumatologist with her PCP.   Medication Adjustments/Labs and Tests Ordered: Current medicines are reviewed at length with the patient today.  Concerns regarding medicines are outlined above.  Medication changes, Labs and Tests ordered today are outlined in the Patient Instructions noted below. Patient Instructions  Medication Instructions:  1. START DIOVAN 80 MG DAILY; NEW RX HAS BEEN SENT IN 2. FINISH YOUR BOTTLE OF EFFIENT; ONCE FINISHED SEE # 3 3. ONCE FINISHED WITH THE EFFIENT YOU WILL START PLAVIX 75 MG DAILY THE NEXT MORNING AFTER YOUR LAST DOSE OF EFFIENT  4. A REFILL WAS SENT IN FOR METOPROLOL AND LIPITOR  Labwork: IN 1-2 WEEKS YOU WILL NEED FASTING LIPID AND CMET LAB WORK   Testing/Procedures: 1. Your physician has requested that you have a lower extremity arterial duplex. PER Jocilynn Grade, PAC THIS IS TO BE DONE WITH ABI'S. During this test, exercise and ultrasound are used to  evaluate arterial blood flow in the legs. Allow one hour for this exam. There are no restrictions or special instructions.  Follow-Up: Your physician wants you to follow-up in: 6 MONTHS WITH DR. ROSS You will receive a reminder letter in the mail two months in advance. If you don't receive a letter, please call our office to schedule the follow-up appointment.  Any Other Special Instructions Will Be Listed Below (If Applicable).  If you need a refill on your cardiac medications before your next appointment, please call your pharmacy.  Signed, Tereso Newcomer, PA-C  07/01/2016 11:00 AM    [facility] [address] [place], [place], [place]  [address] Phone: [phone]; Fax: [phone]

## 2016-07-01 NOTE — Patient Instructions (Addendum)
Medication Instructions:  1. START DIOVAN 80 MG DAILY; NEW RX HAS BEEN SENT IN 2. FINISH YOUR BOTTLE OF EFFIENT; ONCE FINISHED SEE # 3 3. ONCE FINISHED WITH THE EFFIENT YOU WILL START PLAVIX 75 MG DAILY THE NEXT MORNING AFTER YOUR LAST DOSE OF EFFIENT  4. A REFILL WAS SENT IN FOR METOPROLOL AND LIPITOR  Labwork: IN 1-2 WEEKS YOU WILL NEED FASTING LIPID AND CMET LAB WORK   Testing/Procedures: 1. Your physician has requested that you have a lower extremity arterial duplex. PER SCOTT WEAVER, PAC THIS IS TO BE DONE WITH ABI'S. During this test, exercise and ultrasound are used to evaluate arterial blood flow in the legs. Allow one hour for this exam. There are no restrictions or special instructions.  Follow-Up: Your physician wants you to follow-up in: 6 MONTHS WITH DR. ROSS You will receive a reminder letter in the mail two months in advance. If you don't receive a letter, please call our office to schedule the follow-up appointment.  Any Other Special Instructions Will Be Listed Below (If Applicable).  If you need a refill on your cardiac medications before your next appointment, please call your pharmacy.

## 2016-07-08 ENCOUNTER — Telehealth: Payer: Self-pay

## 2016-07-08 DIAGNOSIS — I10 Essential (primary) hypertension: Secondary | ICD-10-CM

## 2016-07-08 MED ORDER — DIOVAN 80 MG PO TABS: 80.0000 mg | ORAL_TABLET | Freq: Every day | ORAL | 11 refills | Status: DC

## 2016-07-08 NOTE — Telephone Encounter (Signed)
Diovan 80 mg is approved with a PA. [id number]. Must be NAME BRAND. Pharmacy notified.

## 2016-07-10 ENCOUNTER — Telehealth: Payer: Self-pay | Admitting: *Deleted

## 2016-07-10 NOTE — Telephone Encounter (Signed)
Prior Authorization received from [facility] pharmacy for Olanzapine 5 mg. ASAP (safety prior authorization) completed at [facility] portal site. PA approved valid 07/10/16-07/10/17. Approval number [id number]. Clovis Pu, RN

## 2016-07-14 ENCOUNTER — Other Ambulatory Visit: Payer: Medicaid Other | Admitting: *Deleted

## 2016-07-14 DIAGNOSIS — I10 Essential (primary) hypertension: Secondary | ICD-10-CM

## 2016-07-14 DIAGNOSIS — E785 Hyperlipidemia, unspecified: Secondary | ICD-10-CM

## 2016-07-14 LAB — COMPREHENSIVE METABOLIC PANEL
ALBUMIN: 3.7 g/dL (ref 3.6–5.1)
ALT: 13 U/L (ref 6–29)
AST: 26 U/L (ref 10–35)
Alkaline Phosphatase: 108 U/L (ref 33–130)
BUN: 5 mg/dL — ABNORMAL LOW (ref 7–25)
CHLORIDE: 104 mmol/L (ref 98–110)
CO2: 29 mmol/L (ref 20–31)
CREATININE: 0.97 mg/dL (ref 0.50–0.99)
Calcium: 8.6 mg/dL (ref 8.6–10.4)
Glucose, Bld: 100 mg/dL — ABNORMAL HIGH (ref 65–99)
POTASSIUM: 3.5 mmol/L (ref 3.5–5.3)
Sodium: 140 mmol/L (ref 135–146)
TOTAL PROTEIN: 7.6 g/dL (ref 6.1–8.1)
Total Bilirubin: 0.5 mg/dL (ref 0.2–1.2)

## 2016-07-14 LAB — LIPID PANEL
Cholesterol: 79 mg/dL (ref <200)
HDL: 23 mg/dL — AB (ref >50)
LDL Cholesterol: 39 mg/dL (ref <100)
TRIGLYCERIDES: 83 mg/dL (ref <150)
Total CHOL/HDL Ratio: 3.4 Ratio (ref <5.0)
VLDL: 17 mg/dL (ref <30)

## 2016-07-15 ENCOUNTER — Telehealth: Payer: Self-pay | Admitting: *Deleted

## 2016-07-15 NOTE — Telephone Encounter (Signed)
Pt notified of lab results by phone with verbal understanding. Pt said thank you for the good news.  

## 2016-07-16 ENCOUNTER — Other Ambulatory Visit: Payer: Self-pay | Admitting: Physician Assistant

## 2016-07-16 DIAGNOSIS — M79604 Pain in right leg: Secondary | ICD-10-CM

## 2016-07-23 ENCOUNTER — Encounter (HOSPITAL_COMMUNITY): Payer: Medicaid Other

## 2016-07-29 ENCOUNTER — Emergency Department (HOSPITAL_COMMUNITY)
Admission: EM | Admit: 2016-07-29 | Discharge: 2016-07-29 | Disposition: A | Discharge status: Home / Self Care | Payer: Medicaid Other | Attending: Emergency Medicine | Admitting: Emergency Medicine

## 2016-07-29 ENCOUNTER — Encounter (HOSPITAL_COMMUNITY): Payer: Self-pay

## 2016-07-29 DIAGNOSIS — M19032 Primary osteoarthritis, left wrist: Secondary | ICD-10-CM | POA: Diagnosis not present

## 2016-07-29 DIAGNOSIS — F17210 Nicotine dependence, cigarettes, uncomplicated: Secondary | ICD-10-CM | POA: Diagnosis not present

## 2016-07-29 DIAGNOSIS — I2510 Atherosclerotic heart disease of native coronary artery without angina pectoris: Secondary | ICD-10-CM | POA: Insufficient documentation

## 2016-07-29 DIAGNOSIS — J449 Chronic obstructive pulmonary disease, unspecified: Secondary | ICD-10-CM | POA: Insufficient documentation

## 2016-07-29 DIAGNOSIS — M25532 Pain in left wrist: Secondary | ICD-10-CM | POA: Diagnosis present

## 2016-07-29 DIAGNOSIS — Z7982 Long term (current) use of aspirin: Secondary | ICD-10-CM | POA: Insufficient documentation

## 2016-07-29 DIAGNOSIS — M1990 Unspecified osteoarthritis, unspecified site: Secondary | ICD-10-CM

## 2016-07-29 DIAGNOSIS — I252 Old myocardial infarction: Secondary | ICD-10-CM | POA: Diagnosis not present

## 2016-07-29 DIAGNOSIS — I10 Essential (primary) hypertension: Secondary | ICD-10-CM | POA: Diagnosis not present

## 2016-07-29 MED ORDER — HYDROCODONE-ACETAMINOPHEN 5-325 MG PO TABS
1.0000 | ORAL_TABLET | Freq: Once | ORAL | Status: AC
  Administered 2016-07-29: 1 via ORAL
  Filled 2016-07-29: qty 1

## 2016-07-29 MED ORDER — SULFASALAZINE 500 MG PO TABS: 1000.0000 mg | ORAL_TABLET | Freq: Two times a day (BID) | ORAL | 0 refills | Status: DC

## 2016-07-29 NOTE — Discharge Instructions (Signed)
Please follow with your primary care doctor in the next 2 days for a check-up. They must obtain records for further management.  ° °Do not hesitate to return to the Emergency Department for any new, worsening or concerning symptoms.  ° °

## 2016-07-29 NOTE — ED Provider Notes (Signed)
[facility]-EMERGENCY DEPT Provider Note   CSN: [id number] Arrival date & time: 07/29/16  0825     History   Chief Complaint Chief Complaint  Patient presents with  . Arm Swelling  . Arm Pain    HPI  Blood pressure (!) 209/115, pulse 78, temperature 97.9 F (36.6 C), temperature source Oral, resp. rate 16, height 5\' 6"  (1.676 m), weight 63 kg, SpO2 100 %.  Jocelyn Shelton is a 61 y.o. female complaining of right wrist pain Worsening over the course of several days consistent with prior rheumatoid arthritis pain unfortunately, her rheumatologist has declined to see Medicaid patients, she does have a prescription for her methotrexate and full of acid but is unable to obtain her sulfasalazine because she doesn't have a prescription. Pain is moderate, 6 out of 10 and not associated with fever, chills. She believes to be a mild amount of swelling in the area. Denies any trauma. She states that she does have her high blood pressure medication but she hasn't taken it this morning. She denies any chest pain, shortness of breath, abdominal pain, headache.   Past Medical History:  Diagnosis Date  . CAD in native artery 05/23/2015  . Hypertension   . Reflux   . Rheumatoid arthritis(714.0)   . S/P angioplasty with stent, 05/22/15 pRCA DES and pLCX DES 05/23/2015  . Scleroderma [facility])     Patient Active Problem List   Diagnosis Date Noted  . FUO (fever of unknown origin)   . Fever   . Foot ulcer, right (HCC)   . Syncope 03/08/2016  . Scleroderma (HCC) 03/08/2016  . Hyponatremia 03/08/2016  . Herpes zoster 03/08/2016  . Immunosuppressed status (HCC)   . Shingles   . Upper airway cough syndrome 08/30/2015  . COPD GOLD 0 still smoking 08/30/2015  . Coronary artery disease involving native heart without angina pectoris 05/23/2015  . S/P angioplasty with stent, 05/22/15 pRCA DES and pLCX DES 05/23/2015  . Adverse drug reaction 05/23/2015  . NSTEMI (non-ST elevated myocardial infarction) (HCC)    . Hypertensive emergency   . Chest pain   . Hypoxia   . Abdominal pain, acute   . Volume overload   . Pleural effusion 02/08/2015  . Tachycardia   . Allergic rhinitis 12/03/2014  . Tendinitis of right rotator cuff 12/03/2014  . Chronic pain syndrome 12/03/2014  . Diabetes mellitus screening 03/26/2014  . Chronic foot pain 03/26/2014  . Plantar wart of right foot 03/26/2014  . Rheumatoid arthritis (HCC) 03/14/2013  . Anemia 03/02/2013  . Other pancytopenia (HCC) 03/01/2013  . Thrombocytopenia, unspecified 03/01/2013  . Facial rash 02/02/2012  . 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 Erythema nodosum 04/27/2011  . GASTROESOPHAGEAL REFLUX DISEASE 08/02/2008  . CORNS AND CALLOSITIES 03/18/2007  . Major depressive disorder, recurrent episode (HCC) 09/23/2006  . Cigarette smoker 09/23/2006  . HYPERTENSION, BENIGN SYSTEMIC 09/23/2006  . RAYNAUDS SYNDROME 09/23/2006  . CLAUDICATION, INTERMITTENT 09/23/2006  . OSTEOARTHRITIS, MULTI SITES 09/23/2006    Past Surgical History:  Procedure Laterality Date  . APPENDECTOMY  90's  . CARDIAC CATHETERIZATION N/A 05/22/2015   Procedure: Left Heart Cath and Coronary Angiography;  Surgeon: 05/24/2015, MD;  Location: [facility] INVASIVE CV LAB;  Service: Cardiovascular;  Laterality: N/A;  . CARDIAC CATHETERIZATION N/A 05/22/2015   Procedure: Coronary Stent Intervention;  Surgeon: 05/24/2015, MD;  Location: [facility] INVASIVE CV LAB;  Service: Cardiovascular;  Laterality: N/A;    OB History    No data available  Home Medications    Prior to Admission medications   Medication Sig Start Date End Date Taking? Authorizing Provider  acetaminophen (TYLENOL) 500 MG tablet Take 1,000 mg by mouth daily as needed for pain.    Yes Historical Provider, MD  aspirin EC 81 MG EC tablet Take 1 tablet (81 mg total) by mouth daily. 05/24/15  Yes Rhonda G Barrett, PA-C  atorvastatin (LIPITOR) 40 MG tablet Take 1 tablet (40 mg total) by mouth  daily at 6 PM. 07/01/16  Yes Beatrice Lecher, PA-C  ATROVENT HFA 17 MCG/ACT inhaler INHALE TWO PUFFS BY MOUTH ONCE DAILY AS NEEDED FOR WHEEZING 04/02/16  Yes Elmon Else McMullen, DO  DIOVAN 80 MG tablet Take 1 tablet (80 mg total) by mouth daily. 07/08/16  Yes Scott T Alben Spittle, PA-C  dorzolamide-timolol (COSOPT) 22.3-6.8 MG/ML ophthalmic solution Place 1 drop into both eyes 2 (two) times daily.   Yes Historical Provider, MD  famotidine (PEPCID) 20 MG tablet Take 20 mg by mouth at bedtime.    Yes Historical Provider, MD  fluticasone (FLONASE) 50 MCG/ACT nasal spray USE TWO SPRAY(S) IN EACH NOSTRIL DAILY AS NEEDED FOR  RHINITIS 07/25/15  Yes Smitty Cords, DO  folic acid (FOLVITE) 1 MG tablet TAKE 1 TABLET (1 MG TOTAL) BY MOUTH DAILY. 01/22/15  Yes Alexander Fredric Mare, DO  methotrexate (RHEUMATREX) 2.5 MG tablet Take 4 tablets (10 mg total) by mouth 2 (two) times a week. Take 5 tablets every Mon and Tues. Caution:Chemotherapy. Protect from light. 03/16/16  Yes Freddrick March, MD  metoprolol (LOPRESSOR) 50 MG tablet Take 1 tablet (50 mg total) by mouth 2 (two) times daily. 07/01/16  Yes Beatrice Lecher, PA-C  Multiple Vitamin (MULTIVITAMIN WITH MINERALS) TABS tablet Take 1 tablet by mouth daily.   Yes Historical Provider, MD  nitroGLYCERIN (NITROSTAT) 0.4 MG SL tablet Place 0.4 mg under the tongue every 5 (five) minutes as needed for chest pain.   Yes Historical Provider, MD  OLANZapine (ZYPREXA) 5 MG tablet Take 1 tablet (5 mg total) by mouth at bedtime. 06/29/16  Yes Wendee Beavers, DO  omeprazole (PRILOSEC) 40 MG capsule Take 1 capsule (40 mg total) by mouth daily. 06/04/16  Yes Wendee Beavers, DO  prasugrel (EFFIENT) 10 MG TABS tablet Take 1 tablet (10 mg total) by mouth daily. 06/25/16  Yes Pricilla Riffle, MD  PROVENTIL HFA 108 [phone] Base) MCG/ACT inhaler INHALE TWO PUFFS BY MOUTH EVERY 4 HOURS AS NEEDED FOR WHEEZING OR SHORTNESS OF BREATH (COUGH) 04/02/16  Yes Wendee Beavers, DO  clopidogrel (PLAVIX)  75 MG tablet Take 1 tablet (75 mg total) by mouth daily. DO NOT START UNTIL YOU FINISH YOUR EFFIENT Patient not taking: Reported on 07/29/2016 07/01/16   Beatrice Lecher, PA-C  gabapentin (NEURONTIN) 100 MG capsule Take 1 capsule (100 mg total) by mouth 3 (three) times daily. Patient not taking: Reported on 07/29/2016 03/24/16   Tillman Sers, DO  sulfaSALAzine (AZULFIDINE) 500 MG tablet Take 2 tablets (1,000 mg total) by mouth 2 (two) times daily. 07/29/16 08/28/16  Joni Reining Verla Bryngelson, PA-C    Family History Family History  Problem Relation Age of Onset  . Hypertension Father   . Heart attack Mother   . Diabetes Daughter   . Diabetes Sister   . Lung cancer Brother 90    Social History Social History  Substance Use Topics  . Smoking status: Current Every Day Smoker    Packs/day: 0.50    Years: 40.00  Types: Cigarettes  . Smokeless tobacco: Never Used  . Alcohol use 2.4 oz/week    4 Standard drinks or equivalent per week     Allergies   Ticagrelor; Cymbalta [duloxetine hcl]; Aspirin; Lisinopril; and Tomato   Review of Systems Review of Systems  10 systems reviewed and found to be negative, except as noted in the HPI.   Physical Exam Updated Vital Signs BP (!) 185/103   Pulse 71   Temp 97.9 F (36.6 C) (Oral)   Resp 16   Ht 5\' 6"  (1.676 m)   Wt 63 kg   SpO2 98%   BMI 22.44 kg/m   Physical Exam  Constitutional: She is oriented to person, place, and time. She appears well-developed and well-nourished. No distress.  HENT:  Head: Normocephalic and atraumatic.  Mouth/Throat: Oropharynx is clear and moist.  Eyes: Conjunctivae and EOM are normal. Pupils are equal, round, and reactive to light.  Neck: Normal range of motion.  Cardiovascular: Normal rate, regular rhythm and intact distal pulses.   Pulmonary/Chest: Effort normal and breath sounds normal. No respiratory distress. She has no wheezes. She has no rales. She exhibits no tenderness.  Abdominal: Soft. She exhibits  no distension and no mass. There is no tenderness. There is no rebound and no guarding. No hernia.  Musculoskeletal: Normal range of motion. She exhibits no edema or tenderness.  No swelling to left wrist, radial pulse 2+, FROM no overlying skin changes or warmth.   Neurological: She is alert and oriented to person, place, and time.  Skin: Capillary refill takes less than 2 seconds. She is not diaphoretic.  Psychiatric: She has a normal mood and affect.  Nursing note and vitals reviewed.    ED Treatments / Results  Labs (all labs ordered are listed, but only abnormal results are displayed) Labs Reviewed - No data to display  EKG  EKG Interpretation None       Radiology No results found.  Procedures Procedures (including critical care time)  Medications Ordered in ED Medications  HYDROcodone-acetaminophen (NORCO/VICODIN) 5-325 MG per tablet 1 tablet (1 tablet Oral Given 07/29/16 0911)     Initial Impression / Assessment and Plan / ED Course  I have reviewed the triage vital signs and the nursing notes.  Pertinent labs & imaging results that were available during my care of the patient were reviewed by me and considered in my medical decision making (see chart for details).  Clinical Course     Vitals:   07/29/16 0834 07/29/16 0836 07/29/16 0948  BP:  (!) 209/115 (!) 185/103  Pulse:  78 71  Resp:  16 16  Temp:  97.9 F (36.6 C)   TempSrc:  Oral   SpO2:  100% 98%  Weight: 63 kg    Height: 5\' 6"  (1.676 m)      Medications  HYDROcodone-acetaminophen (NORCO/VICODIN) 5-325 MG per tablet 1 tablet (1 tablet Oral Given 07/29/16 0911)    TERRALYN MATSUMURA is 61 y.o. female presenting with Right wrist pain consistent with prior rheumatoid arthritis exacerbations. She's been out of her sulfasalazine it's unclear if her rheumatologist was unable to fill the prescription or the pharmacist may have not filled it because she has a allergy listed to aspirin however she takes  aspirin daily and this is not a true allergy but a normal side effect of stomach irritation. No trauma. Her blood pressure is elevated however she hasn't taken her morning dose of blood pressure medication and she is asymptomatic. I've  encouraged her to take this and I will refill her sulfasalazine prescription.  Evaluation does not show pathology that would require ongoing emergent intervention or inpatient treatment. Pt is hemodynamically stable and mentating appropriately. Discussed findings and plan with patient/guardian, who agrees with care plan. All questions answered. Return precautions discussed and outpatient follow up given.      Final Clinical Impressions(s) / ED Diagnoses   Final diagnoses:  Arthritis    New Prescriptions Discharge Medication List as of 07/29/2016  9:48 AM       Wynetta Emery, PA-C 07/29/16 1012    Maia Plan, MD 07/29/16 [phone]

## 2016-07-29 NOTE — ED Triage Notes (Signed)
Pt c/o increasing R forearm swelling and pain x 1 week.  Pain score 10/10.  Pt has not taken anything for pain.  Denies injury.  Slight swelling noted.

## 2016-08-03 ENCOUNTER — Other Ambulatory Visit: Payer: Self-pay | Admitting: *Deleted

## 2016-08-03 DIAGNOSIS — J449 Chronic obstructive pulmonary disease, unspecified: Secondary | ICD-10-CM

## 2016-08-03 DIAGNOSIS — R058 Other specified cough: Secondary | ICD-10-CM

## 2016-08-03 DIAGNOSIS — R05 Cough: Secondary | ICD-10-CM

## 2016-08-03 MED ORDER — ALBUTEROL SULFATE HFA 108 (90 BASE) MCG/ACT IN AERS
INHALATION_SPRAY | RESPIRATORY_TRACT | 1 refills | Status: DC
Start: 2016-08-03 — End: 2016-11-23

## 2016-08-06 ENCOUNTER — Other Ambulatory Visit: Payer: Self-pay | Admitting: Family Medicine

## 2016-08-06 DIAGNOSIS — M069 Rheumatoid arthritis, unspecified: Secondary | ICD-10-CM

## 2016-08-20 ENCOUNTER — Ambulatory Visit (HOSPITAL_COMMUNITY)
Admission: RE | Admit: 2016-08-20 | Discharge: 2016-08-20 | Disposition: A | Discharge status: Home / Self Care | Payer: Medicaid Other | Source: Ambulatory Visit | Attending: Cardiovascular Disease | Admitting: Cardiovascular Disease

## 2016-08-20 DIAGNOSIS — R0989 Other specified symptoms and signs involving the circulatory and respiratory systems: Secondary | ICD-10-CM | POA: Diagnosis not present

## 2016-08-20 DIAGNOSIS — M79604 Pain in right leg: Secondary | ICD-10-CM

## 2016-08-24 ENCOUNTER — Encounter: Payer: Self-pay | Admitting: Physician Assistant

## 2016-08-25 ENCOUNTER — Telehealth: Payer: Self-pay | Admitting: *Deleted

## 2016-08-25 NOTE — Telephone Encounter (Signed)
Pt notified of LEA results by phone with verbal understanding.

## 2016-08-25 NOTE — Telephone Encounter (Signed)
Lmtcb to go over LEA results.  

## 2016-09-20 NOTE — Progress Notes (Signed)
Subjective:   Patient ID: Jocelyn Shelton    DOB: [birth year], 61 y.o. female   MRN: [id number]  CC: "right wrist pain"  HPI: Jocelyn Shelton is a 61 y.o. female who presents to clinic today for right wrist pain. Problems discussed today are as follows:  Rheumatoid arthritis / Systemic scleroderma: Pt formerly seen y rheumatologist Dr. Kellie Simmering but recently dismissed from practice due to "no longer seeing medicaid pts." Pt was given 3 month supply of MTX, folate, and sulfasalazine, and has remained compliant with therapy. Says she has been experiencing pain "from her head to toes" because of the RA. Recently seen at [facility] ED on 07/29/2016 and given refill of sulfasalazine Rx. Denies fevers/chills, SOB, CP, n/v, h/a, change in vision, neck pain.  High blood pressure: Pt not sure what BP usually runs. Complaints with lopressor and Losartan. Does use tobacco. Pt has chronic pain from RA and SS as stated above.   ROS: complete ROS performed, see HPI for pertinent ROS.  PMFSH: Diabetes mellitus, rheumatoid arthritis, COPD, . Smoking status reviewed. Medications reviewed.  Objective:   BP (!) 170/100   Pulse 94   Temp 98.6 F (37 C) (Oral)   Ht 5\' 6"  (1.676 m)   Wt 137 lb (62.1 kg)   SpO2 98%   BMI 22.11 kg/m  Vitals and nursing note reviewed.  General: well nourished, well developed, in no acute distress with non-toxic appearance HEENT: normocephalic, atraumatic, moist mucous membranes Neck: supple, non-tender without lymphadenopathy CV: regular rate and rhythm without murmurs, rubs, or gallops, no lower extremity edema Lungs: clear to auscultation bilaterally with normal work of breathing Abdomen: soft, non-tender, non-distended, no masses or organomegaly palpable, normoactive bowel sounds Skin: warm, dry, no rashes or lesions, cap refill < 2 seconds Extremities: warm and well perfused, normal tone, diffuse nodular swelling on PCP and MCP joints of hands b/l  Assessment & Plan:    Rheumatoid arthritis (HCC) On DMARDs and compliant per pt. Has MTX, sulfasalazine, and folate Rx. Right wrist pain with increased edema at PCP and MCP joints. Appears to have acute exacerbation of RA. --Given 1 month prednisone taper: 60 mg QD x1 week, then 40 mg QD x1 week, 20 mg QD x1 week, 10 mg QD x1 week --Has Rx for DMARDs --Ambulatory referral for rheumatology sent --Flu shot given --RTC in 1 month  Primary hypertension Uncontrolled. On ARB and BB. Persistently elevated past few office visits. Suspect chronic pain may play a role. Kidney function good by last CMET.  --Initiating HCTZ 25 mg QD --Info concerning HCTZ given --Will recheck CMET and CBC at next visit --RTC in 1 month  Orders Placed This Encounter  Procedures  . Flu Vaccine QUAD 36+ mos IM  . Ambulatory referral to Rheumatology    Referral Priority:   Routine    Referral Type:   Consultation    Referral Reason:   Specialty Services Required    Requested Specialty:   Rheumatology    Number of Visits Requested:   1   Meds ordered this encounter  Medications  . predniSONE (DELTASONE) 20 MG tablet    Sig: Take 3 tabs daily for 1 week, then 2 tabs daily for 1 week, then 1 tab daily for 1 week, then half tab daily for 1 week.    Dispense:  46 tablet    Refill:  0  . hydrochlorothiazide (HYDRODIURIL) 25 MG tablet    Sig: Take 1 tablet (25 mg total) by mouth daily.  Dispense:  90 tablet    Refill:  3    Durward Parcel, DO [facility], PGY-1 09/26/2016 7:01 PM

## 2016-09-25 ENCOUNTER — Encounter: Payer: Self-pay | Admitting: Family Medicine

## 2016-09-25 ENCOUNTER — Ambulatory Visit (INDEPENDENT_AMBULATORY_CARE_PROVIDER_SITE_OTHER): Payer: Medicaid Other | Admitting: Family Medicine

## 2016-09-25 VITALS — BP 170/100 | HR 94 | Temp 98.6°F | Ht 66.0 in | Wt 137.0 lb

## 2016-09-25 DIAGNOSIS — Z23 Encounter for immunization: Secondary | ICD-10-CM

## 2016-09-25 DIAGNOSIS — M069 Rheumatoid arthritis, unspecified: Secondary | ICD-10-CM

## 2016-09-25 DIAGNOSIS — I10 Essential (primary) hypertension: Secondary | ICD-10-CM | POA: Diagnosis not present

## 2016-09-25 MED ORDER — PREDNISONE 20 MG PO TABS: ORAL_TABLET | ORAL | 0 refills | Status: DC

## 2016-09-25 MED ORDER — HYDROCHLOROTHIAZIDE 25 MG PO TABS: 25.0000 mg | ORAL_TABLET | Freq: Every day | ORAL | 3 refills | Status: DC

## 2016-09-25 NOTE — Assessment & Plan Note (Addendum)
On DMARDs and compliant per pt. Has MTX, sulfasalazine, and folate Rx. Right wrist pain with increased edema at PCP and MCP joints. Appears to have acute exacerbation of RA. --Given 1 month prednisone taper: 60 mg QD x1 week, then 40 mg QD x1 week, 20 mg QD x1 week, 10 mg QD x1 week --Has Rx for DMARDs --Ambulatory referral for rheumatology sent --Flu shot given --RTC in 1 month

## 2016-09-25 NOTE — Assessment & Plan Note (Addendum)
Uncontrolled. On ARB and BB. Persistently elevated past few office visits. Suspect chronic pain may play a role. Kidney function good by last CMET.  --Initiating HCTZ 25 mg QD --Info concerning HCTZ given --Will recheck CMET and CBC at next visit --RTC in 1 month

## 2016-09-25 NOTE — Patient Instructions (Signed)
Thank you for coming in to see [place] today. Please see below to review our plan for today's visit.  1. I have sent in a prescription for your rheumatoid arthritis flare. Please take the prednisone as instructed. You will take 3 tabs daily for 1 week, then 2 tabs daily for 1 week, then 1 tab daily for 1 week, then half tab daily for 1 week. 2. Your blood pressure is elevated so I have given you a new medication called HCTZ to help control this. Below is information on the drug. 3. You are up-to-date on your flu vaccine this year. 4. Return to clinic in 1 month for recheck of your blood pressure and we will draw blood work. I will contact our referral contact to help you get established with a rheumatologist if possible.  Please call the clinic at [phone] if your symptoms worsen or you have any concerns. It was my pleasure to see you. -- Durward Parcel, DO  Family Medicine, PGY-1  Aliskiren; Hydrochlorothiazide, HCTZ Tablet What is this medicine? ALISKIREN; HYDROCHLOROTHIAZIDE (a lis KYE ren; hye droe klor oh THYE a zide) is a combination of a renin inhibitor and a diuretic. It is used to treat high blood pressure. This medicine may be used for other purposes; ask your health care provider or pharmacist if you have questions. COMMON BRAND NAME(S): Tekturna HCT What should I tell my health care provider before I take this medicine? They need to know if you have any of these conditions: -dehydration -diabetes -gout -kidney disease or kidney stones -liver disease -pancreatitis -small amount of urine or difficulty passing urine -an unusual or allergic reaction to aliskiren, hydrochlorothiazide, HCTZ, other medicines, foods, dyes, or preservatives -pregnant or trying to get pregnant -breast-feeding How should I use this medicine? Take this medicine by mouth with a glass of water. Follow the directions on your prescription label. You can take this medicine with or without food.  However, you should always take it the same way each time. Take your medicine at regular intervals. Do not take it more often than directed. Do not stop taking except on your doctor's advice. Talk to your pediatrician regarding the use of this medicine in children. Special care may be needed. Overdosage: If you think you have taken too much of this medicine contact a poison control center or emergency room at once. NOTE: This medicine is only for you. Do not share this medicine with others. What if I miss a dose? If you miss a dose, take it as soon as you can. If it is almost time for your next dose, take only that dose. Do not take double or extra doses. What may interact with this medicine? -alcohol -atorvastatin -barbiturates -cholestyramine -colestipol -digoxin -dofetilide -furosemide -irbesartan -lithium -medicines for blood pressure -medicines for diabetes -medicines for fungal infections like ketoconazole -medicines that relax muscles for surgery -narcotic medicines for pain -NSAIDs, medicines for pain and inflammation, like ibuprofen or naproxen -potassium supplements -steroid medicines like prednisone or cortisone This list may not describe all possible interactions. Give your health care provider a list of all the medicines, herbs, non-prescription drugs, or dietary supplements you use. Also tell them if you smoke, drink alcohol, or use illegal drugs. Some items may interact with your medicine. What should I watch for while using this medicine? Visit your doctor for regular check ups. Check your blood pressure as directed. Ask your doctor what your blood pressure should be and when you should contact him or  her. This medicine may affect your blood sugar level. If you have diabetes, check with your doctor or health care professional before changing the dose of your diabetic medicine. Do not take this medicine if you have diabetes and are taking a medicine called an  angiotensin-receptor-blocker (ARB) or angiotensin-converting-enzyme-inhibitor (ACE inhibitor). Talk to your doctor or health care professional for more information. Women should inform their doctor if they wish to become pregnant or think they might be pregnant. There is a potential for serious side effects to an unborn child. Talk to your health care professional or pharmacist for more information. You may need to be on a special diet while taking this medicine. Ask your doctor. Check with your doctor or health care professional if you get an attack of severe diarrhea, nausea and vomiting, or if you sweat a lot. The loss of too much body fluid can make it dangerous for you to take this medicine. You may get drowsy or dizzy. Do not drive, use machinery, or do anything that needs mental alertness until you know how this medicine affects you. Do not stand or sit up quickly, especially if you are an older patient. This reduces the risk of dizzy or fainting spells. Alcohol may interfere with the effect of this medicine. Avoid alcoholic drinks. This medicine can make you more sensitive to the sun. Keep out of the sun. If you cannot avoid being in the sun, wear protective clothing and use sunscreen. Do not use sun lamps or tanning beds/booths. What side effects may I notice from receiving this medicine? Side effects that you should report to your doctor or health care professional as soon as possible: -allergic reactions like skin rash or hives, swelling of the hands, feet, face, lips, throat, or tongue -breathing problems -changes in vision -eye pain -fast, irregular heartbeat -feeling faint or lightheaded, falls -fever or sore throat -gout pain -low blood pressure -muscle pain or cramps -pain, tingling, numbness in the hands or feet -pain or difficulty passing urine -redness, blistering, peeling or loosening of the skin, including inside the mouth -seizures -unusually weak or tired Side effects  that usually do not require medical attention (report to your doctor or health care professional if they continue or are bothersome): -change in sex drive or performance -cough -diarrhea -dizziness -dry mouth -flu-like symptoms -headache -stomach upset This list may not describe all possible side effects. Call your doctor for medical advice about side effects. You may report side effects to FDA at 1-800-FDA-1088. Where should I keep my medicine? Keep out of the reach of children. Store at room temperature between 15 and 30 degrees C (59 and 86 degrees F). Protect from moisture. Throw away any unused medicine after the expiration date. NOTE: This sheet is a summary. It may not cover all possible information. If you have questions about this medicine, talk to your doctor, pharmacist, or health care provider.  2018 Elsevier/Gold Standard (2010-11-14 14:56:24)

## 2016-10-27 NOTE — Progress Notes (Signed)
   Subjective:   Patient ID: Jocelyn Shelton    DOB: Jan 23, 1956, 61 y.o. female   MRN: [id number]  CC: "rheumatoid arthritis"  HPI: Jocelyn Shelton is a 61 y.o. female who presents to clinic today for HTN and RA follow up. Problems discussed today are as follows:  Rheumatoid arthritis: Acute flare resolved since receiving prednisone taper last visit. Wanting prednisone again. Taking DMARDs but needs refill. Says daughter received call from [facility] concerning rheumatology apt but unsure status.  Hypertension: Not taking HCTZ as pts daughter stated med is dangerous when taking plavix. Cannot say why she said it is dangerous. Taking metoprolol and Diovan.  ROS: complete ROS performed, see HPI for pertinent ROS.  PMFSH: Diabetes mellitus, rheumatoid arthritis, COPD. Smoking status reviewed. Medications reviewed.  Objective:   BP (!) 152/94   Pulse 80   Temp 98.6 F (37 C) (Oral)   Ht 5\' 6"  (1.676 m)   Wt 141 lb (64 kg)   SpO2 98%   BMI 22.76 kg/m  Vitals and nursing note reviewed.  General: pleasent frail elderly woman, well developed, in no acute distress with non-toxic appearance HEENT: normocephalic, atraumatic, moist mucous membranes Neck: supple, non-tender without lymphadenopathy CV: regular rate and rhythm without murmurs, rubs, or gallops, no lower extremity edema Lungs: clear to auscultation bilaterally with normal work of breathing Skin: warm, dry, no rashes or lesions, cap refill < 2 seconds Extremities: warm and well perfused, normal tone, Bouchard nodes appreciated without ulnar deviation of MCP joints, no erythema or edema  Assessment & Plan:   Rheumatoid arthritis (HCC) Chronic. Acute flare resolved. On DMARDs. Needs new rheumatologist. --Discussed inability to use prednisone long term as this is unsafe --Refill for 1 month supply given for Sulfasalazine 1000 mg QD, and MTX 10 mg weekly on Monday and Tuesday --Determined pt rejected by only Medicaid  rheumatologist in [facility] but rheumatologist available in [facility], pt to call and schedule apt --CMET and CBC w/ diff obtained, next due in 3 months --RTC in 3 months  Primary hypertension Chronic. Uncontrolled. Recently started HCZT. Not taking. --Discussed safe to use HCTZ w/ Plavix --Continue HCTZ 25 mg QD, Diovan 80 mg QD, Lopressor 50 mg BID --On ASA 81 mg QD  Orders Placed This Encounter  Procedures  . CBC with Differential/Platelet  . Comprehensive metabolic panel   Meds ordered this encounter  Medications  . sulfaSALAzine (AZULFIDINE) 500 MG tablet    Sig: Take 2 tablets (1,000 mg total) by mouth 2 (two) times daily.    Dispense:  120 tablet    Refill:  0  . methotrexate (RHEUMATREX) 2.5 MG tablet    Sig: Take 4 tablets (10 mg total) by mouth 2 (two) times a week. Take 5 tablets every Mon and Tues. Caution:Chemotherapy. Protect from light.    Dispense:  32 tablet    Refill:  0    1 month supply    Mon, DO [facility], PGY-1 10/28/2016 9:13 PM

## 2016-10-28 ENCOUNTER — Ambulatory Visit (INDEPENDENT_AMBULATORY_CARE_PROVIDER_SITE_OTHER): Payer: Medicaid Other | Admitting: Family Medicine

## 2016-10-28 ENCOUNTER — Encounter: Payer: Self-pay | Admitting: Family Medicine

## 2016-10-28 DIAGNOSIS — M069 Rheumatoid arthritis, unspecified: Secondary | ICD-10-CM

## 2016-10-28 DIAGNOSIS — I10 Essential (primary) hypertension: Secondary | ICD-10-CM | POA: Diagnosis not present

## 2016-10-28 DIAGNOSIS — I739 Peripheral vascular disease, unspecified: Secondary | ICD-10-CM | POA: Insufficient documentation

## 2016-10-28 MED ORDER — SULFASALAZINE 500 MG PO TABS: 1000.0000 mg | ORAL_TABLET | Freq: Two times a day (BID) | ORAL | 0 refills | Status: DC

## 2016-10-28 MED ORDER — METHOTREXATE 2.5 MG PO TABS: 10.0000 mg | ORAL_TABLET | ORAL | 0 refills | Status: DC

## 2016-10-28 NOTE — Patient Instructions (Addendum)
Thank you for coming in to see [place] today. Please see below to review our plan for today's visit.  1. We have arranged for you to see a rheumatologist in [facility]. This is our best option giving her Medicaid status. This will be with [facility] in [facility] / Dr. Sharmon Revere Please follow-up with their scheduled appointment. I have refilled her methotrexate and sulfasalazine refill for 1 month. I cannot do this long-term as these are medications that need to be monitored by a rheumatologist. 2. I will notify you of your blood work results. 3. Your blood pressure is still high. Please take your HCTZ along with your other blood pressure medications. This is safe to take with Plavix. 4. Return to clinic in 3 months.  Please call the clinic at [phone] if your symptoms worsen or you have any concerns. It was my pleasure to see you. -- Durward Parcel, DO [facility], PGY-1

## 2016-10-28 NOTE — Assessment & Plan Note (Addendum)
Chronic. Acute flare resolved. On DMARDs. Needs new rheumatologist. --Discussed inability to use prednisone long term as this is unsafe --Refill for 1 month supply given for Sulfasalazine 1000 mg QD, and MTX 10 mg weekly on Monday and Tuesday --Determined pt rejected by only Medicaid rheumatologist in [facility] but rheumatologist available in [facility], pt to call and schedule apt --CMET and CBC w/ diff obtained, next due in 3 months --RTC in 3 months

## 2016-10-28 NOTE — Assessment & Plan Note (Signed)
Chronic. Uncontrolled. Recently started HCZT. Not taking. --Discussed safe to use HCTZ w/ Plavix --Continue HCTZ 25 mg QD, Diovan 80 mg QD, Lopressor 50 mg BID --On ASA 81 mg QD

## 2016-10-29 ENCOUNTER — Encounter: Payer: Self-pay | Admitting: Family Medicine

## 2016-10-29 LAB — CBC WITH DIFFERENTIAL/PLATELET
BASOS: 0 %
Basophils Absolute: 0 10*3/uL (ref 0.0–0.2)
EOS (ABSOLUTE): 0 10*3/uL (ref 0.0–0.4)
EOS: 0 %
HEMATOCRIT: 35 % (ref 34.0–46.6)
HEMOGLOBIN: 11.1 g/dL (ref 11.1–15.9)
IMMATURE GRANULOCYTES: 0 %
Immature Grans (Abs): 0 10*3/uL (ref 0.0–0.1)
Lymphocytes Absolute: 0.6 10*3/uL — ABNORMAL LOW (ref 0.7–3.1)
Lymphs: 9 %
MCH: 29.8 pg (ref 26.6–33.0)
MCHC: 31.7 g/dL (ref 31.5–35.7)
MCV: 94 fL (ref 79–97)
MONOCYTES: 4 %
Monocytes Absolute: 0.3 10*3/uL (ref 0.1–0.9)
NEUTROS PCT: 87 %
Neutrophils Absolute: 5.4 10*3/uL (ref 1.4–7.0)
Platelets: 205 10*3/uL (ref 150–379)
RBC: 3.72 x10E6/uL — AB (ref 3.77–5.28)
RDW: 16.3 % — ABNORMAL HIGH (ref 12.3–15.4)
WBC: 6.3 10*3/uL (ref 3.4–10.8)

## 2016-10-29 LAB — COMPREHENSIVE METABOLIC PANEL
A/G RATIO: 1.4 (ref 1.2–2.2)
ALBUMIN: 4.2 g/dL (ref 3.6–4.8)
ALT: 29 IU/L (ref 0–32)
AST: 31 IU/L (ref 0–40)
Alkaline Phosphatase: 169 IU/L — ABNORMAL HIGH (ref 39–117)
BUN / CREAT RATIO: 6 — AB (ref 12–28)
BUN: 5 mg/dL — ABNORMAL LOW (ref 8–27)
Bilirubin Total: 0.3 mg/dL (ref 0.0–1.2)
CALCIUM: 8.9 mg/dL (ref 8.7–10.3)
CO2: 28 mmol/L (ref 18–29)
CREATININE: 0.81 mg/dL (ref 0.57–1.00)
Chloride: 100 mmol/L (ref 96–106)
GFR, EST AFRICAN AMERICAN: 91 mL/min/{1.73_m2} (ref >59)
GFR, EST NON AFRICAN AMERICAN: 79 mL/min/{1.73_m2} (ref >59)
GLOBULIN, TOTAL: 3.1 g/dL (ref 1.5–4.5)
Glucose: 117 mg/dL — ABNORMAL HIGH (ref 65–99)
Potassium: 4.1 mmol/L (ref 3.5–5.2)
SODIUM: 140 mmol/L (ref 134–144)
TOTAL PROTEIN: 7.3 g/dL (ref 6.0–8.5)

## 2016-11-02 ENCOUNTER — Telehealth: Payer: Self-pay | Admitting: *Deleted

## 2016-11-02 NOTE — Telephone Encounter (Signed)
Pharmacist from [facility] called needing clarification on methotrexate 2.5 mg directions. Please give her a call at [phone].  Clovis Pu, RN

## 2016-11-03 ENCOUNTER — Other Ambulatory Visit: Payer: Self-pay | Admitting: Family Medicine

## 2016-11-03 DIAGNOSIS — M069 Rheumatoid arthritis, unspecified: Secondary | ICD-10-CM

## 2016-11-03 NOTE — Telephone Encounter (Signed)
2nd request. ep °

## 2016-11-03 NOTE — Telephone Encounter (Signed)
Called and corrected Rx. Thanks. -- Durward Parcel, DO [facility], PGY-1

## 2016-11-23 ENCOUNTER — Other Ambulatory Visit: Payer: Self-pay | Admitting: Family Medicine

## 2016-11-23 DIAGNOSIS — R058 Other specified cough: Secondary | ICD-10-CM

## 2016-11-23 DIAGNOSIS — J449 Chronic obstructive pulmonary disease, unspecified: Secondary | ICD-10-CM

## 2016-11-23 DIAGNOSIS — R05 Cough: Secondary | ICD-10-CM

## 2016-11-27 ENCOUNTER — Telehealth: Payer: Self-pay | Admitting: Internal Medicine

## 2016-11-27 MED ORDER — FAMOTIDINE 20 MG PO TABS: 20.0000 mg | ORAL_TABLET | Freq: Every day | ORAL | 1 refills | Status: DC

## 2016-11-27 NOTE — Telephone Encounter (Signed)
Called and spoke with pts daughter and she is aware pf the med that has been sent to the pharmacy.   Nothing further is needed.

## 2016-12-22 ENCOUNTER — Other Ambulatory Visit: Payer: Self-pay | Admitting: Family Medicine

## 2016-12-22 DIAGNOSIS — M069 Rheumatoid arthritis, unspecified: Secondary | ICD-10-CM

## 2016-12-28 ENCOUNTER — Other Ambulatory Visit: Payer: Self-pay | Admitting: Family Medicine

## 2016-12-28 DIAGNOSIS — Z1231 Encounter for screening mammogram for malignant neoplasm of breast: Secondary | ICD-10-CM

## 2017-01-15 ENCOUNTER — Encounter: Payer: Self-pay | Admitting: Family Medicine

## 2017-01-15 ENCOUNTER — Ambulatory Visit
Admission: RE | Admit: 2017-01-15 | Discharge: 2017-01-15 | Disposition: A | Discharge status: Home / Self Care | Payer: Medicaid Other | Source: Ambulatory Visit | Attending: Family Medicine | Admitting: Family Medicine

## 2017-01-15 DIAGNOSIS — Z1231 Encounter for screening mammogram for malignant neoplasm of breast: Secondary | ICD-10-CM

## 2017-01-18 ENCOUNTER — Other Ambulatory Visit: Payer: Self-pay | Admitting: Internal Medicine

## 2017-01-26 IMAGING — DX DG CHEST DECUBITUS*L*
1 series · 1 of 1 positions shown · non-contrast
Comparison: PA and lateral chest x-ray May 21, 2015

CLINICAL DATA: Recurrent left pleural effusion

EXAM:
CHEST - LEFT DECUBITUS

[chest decu]
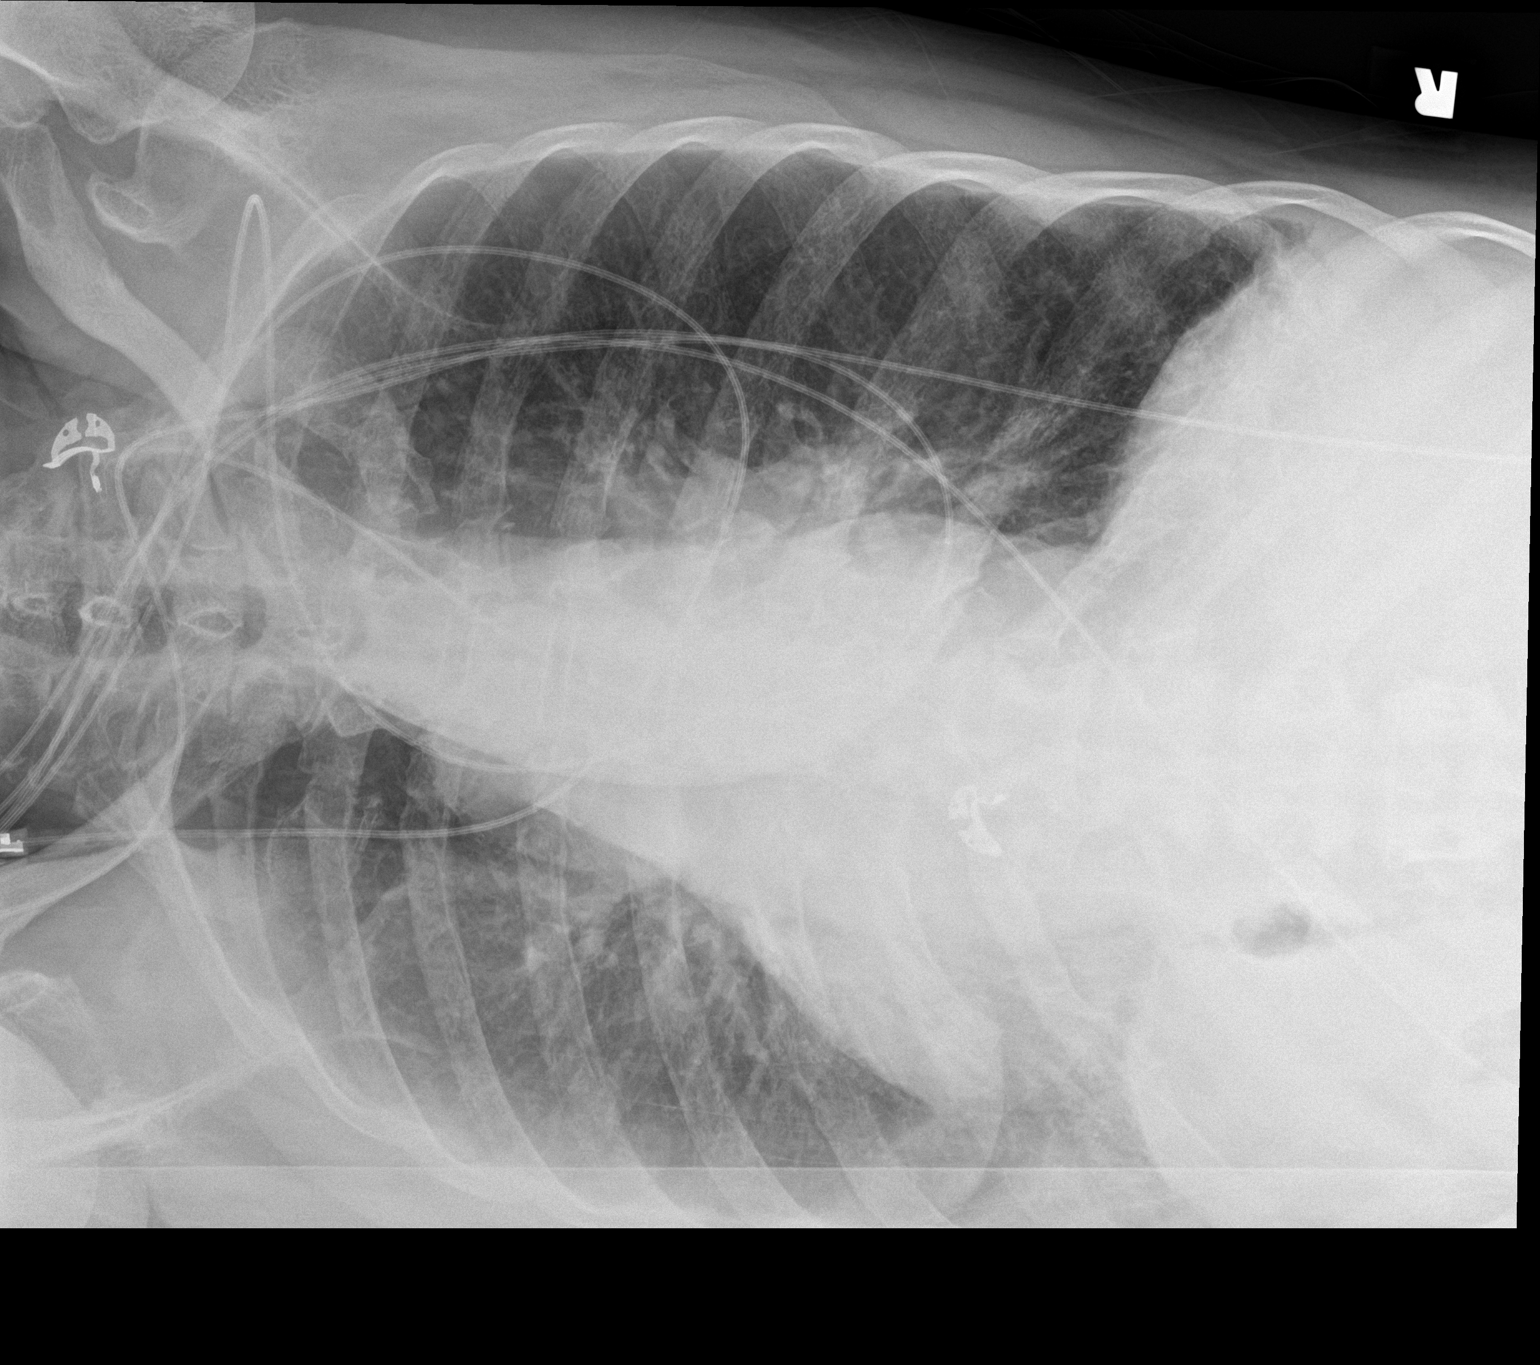

[1 of 1 positions shown; findings below may reference images not displayed]

FINDINGS: The lungs are well-expanded. There is no significant pleural
effusion visible on the left. Hazy density at the left lung base
laterally may reflect atelectasis. No right-sided effusion is
observed either. The heart and pulmonary vascularity are
unremarkable.
IMPRESSION: There is no significant loculated or free flowing pleural effusion
observed. There is mild left lower lobe atelectasis or infiltrate.

## 2017-01-28 NOTE — Progress Notes (Signed)
Subjective:   Patient ID: Jocelyn Shelton    DOB: 07/14/1956, 61 y.o. female   MRN: [id number]  CC: "Rheumatoid arthritis"  HPI: Jocelyn Shelton is a 61 y.o. female who presents to clinic today for RA follow-up. Problems discussed today are as follows:  Rheumatoid arthiritis: Patient scheduled appointment with new rheumatologist on 02/2017. Denies to have diffuse joint pain though better controlled over the past few months while on DMARD's. Its refills of both medications. ROS: Denies fevers or chills, significant joint swelling, myalgias.  GERD: Patient ran out of famotidine but taking Prilosec. Denies symptoms of reflux at this time. ROS: Denies hoarseness of voice, cough, change in weight.  Coronary artery disease: Patient discontinued Plavix by daughter who believed there was a bad interaction between the medication and HCTZ which patient recently restarted since last visit. Patient uncertain why she was on Plavix though does endorse having coronary artery disease. Also on high-intensity statin and aspirin daily. ROS: Denies chest pain, dyspnea, palpitations, easy bruising or bleeding.  Preventative health care: Patient uncertain about last colonoscopy. Believes last was performed in 2009 and was normal but not certain. Last Pap in 2012 with normal finding. ROS: Denies melena or hematochezia, fatigue, vaginal discharge.  Complete ROS performed, see HPI for pertinent.  Jocelyn Shelton: RA with scleroderma, COPD, h/o NSTEMI, PVD with claudication, HTN, MDD. H/o LHC 04/2015 with stent placement, appendectomy. Mother h/o MI, father h/o HTN, sister h/o DM, brother h/o lung cancer age 36. Smoking status reviewed. Medications reviewed.  Objective:   BP 109/60 (BP Location: Left Arm, Patient Position: Sitting, Cuff Size: Normal)   Pulse 86   Temp 98.4 F (36.9 C) (Oral)   Ht '5\' 6"'$  (1.676 m)   Wt 137 lb 9.6 oz (62.4 kg)   SpO2 96%   BMI 22.21 kg/m  Vitals and nursing note reviewed.  General:  frail elderly woman, appears alert than stated age, well nourished, well developed, in no acute distress with non-toxic appearance HEENT: normocephalic, atraumatic, moist mucous membranes Neck: supple, non-tender without lymphadenopathy CV: regular rate and rhythm without murmurs, rubs, or gallops, no lower extremity edema Lungs: clear to auscultation bilaterally with normal work of breathing Abdomen: soft, non-tender, non-distended, no masses or organomegaly palpable, normoactive bowel sounds Skin: warm, dry, no rashes or lesions, cap refill < 2 seconds, minimally taught skin on dorsal surface of hands Extremities: warm and well perfused, normal tone, mild bilateral wrist tenderness with left greater than right  Assessment & Plan:   GERD (gastroesophageal reflux disease) Chronic. Controlled. Currently on PPI and H2 blocker. --Given refill for famotidine 20 mg daily, continue Prilosec 40 mg daily  Rheumatoid arthritis (HCC) Chronic. Controlled. On DMARD's including methotrexate and sulfasalazine. Has scheduled appointment in 02/2017 with new rheumatologist. --Provided 1 month supply refill for methotrexate 2.5 mg twice per day on Monday and Tuesdays weekly and sulfasalazine 500 mg twice a day --Patient to follow up with your rheumatologist on 02/2017 --Will obtain CMP and CBC with differential  Preventative health care Overdue for Pap smear. Previous Pap negative in 2012. Uncertain last colonoscopy though chart review identifies colonoscopy with normal findings in 2009 with [facility]. --Patient given consent for release of information but fail to completely information prior to leaving, will need to follow-up during next visit in order to obtain colonoscopy results --RTC 1 month for Pap smear  Coronary artery disease involving native heart without angina pectoris Chronic. Stable. Currently on aspirin informally on Plavix discontinued by patient's daughter  who is concerned  about interaction with HCTZ.  --Patient provided refill for Plavix 75 mg daily and continue aspirin 81 mg daily along with Lipitor 40 mg daily  Orders Placed This Encounter  Procedures  . CMP14+EGFR  . CBC with Differential/Platelet  . TSH   Meds ordered this encounter  Medications  . Black Cohosh 40 MG CAPS    Sig: Take 1 capsule by mouth 2 (two) times daily.  . Calcium Carb-Cholecalciferol (CALCIUM 600+D3 PO)    Sig: Take 1 tablet by mouth daily.  Marland Kitchen sulfaSALAzine (AZULFIDINE) 500 MG tablet    Sig: Take 2 tablets (1,000 mg total) by mouth 2 (two) times daily.    Dispense:  120 tablet    Refill:  0    Please consider 90 day supplies to promote better adherence  . methotrexate (RHEUMATREX) 2.5 MG tablet    Sig: TAKE 4 TABLETS (10MG) BY MOUTH 2 TIMES A WEEK EVERY MONDAY AND TUESDAY    Dispense:  32 tablet    Refill:  0    Please consider 90 day supplies to promote better adherence  . clopidogrel (PLAVIX) 75 MG tablet    Sig: Take 1 tablet (75 mg total) by mouth daily. DO NOT START UNTIL YOU FINISH YOUR EFFIENT    Dispense:  90 tablet    Refill:  3  . famotidine (PEPCID) 20 MG tablet    Sig: Take 1 tablet (20 mg total) by mouth at bedtime.    Dispense:  30 tablet    Refill:  [place], [facility], PGY-2 01/29/2017 1:45 PM

## 2017-01-29 ENCOUNTER — Encounter: Payer: Self-pay | Admitting: Family Medicine

## 2017-01-29 ENCOUNTER — Ambulatory Visit (INDEPENDENT_AMBULATORY_CARE_PROVIDER_SITE_OTHER): Payer: Medicaid Other | Admitting: Family Medicine

## 2017-01-29 VITALS — BP 109/60 | HR 86 | Temp 98.4°F | Ht 66.0 in | Wt 137.6 lb

## 2017-01-29 DIAGNOSIS — K219 Gastro-esophageal reflux disease without esophagitis: Secondary | ICD-10-CM | POA: Diagnosis not present

## 2017-01-29 DIAGNOSIS — M069 Rheumatoid arthritis, unspecified: Secondary | ICD-10-CM | POA: Diagnosis present

## 2017-01-29 DIAGNOSIS — Z0000 Encounter for general adult medical examination without abnormal findings: Secondary | ICD-10-CM

## 2017-01-29 DIAGNOSIS — I2510 Atherosclerotic heart disease of native coronary artery without angina pectoris: Secondary | ICD-10-CM

## 2017-01-29 MED ORDER — SULFASALAZINE 500 MG PO TABS: 1000.0000 mg | ORAL_TABLET | Freq: Two times a day (BID) | ORAL | 0 refills | Status: DC

## 2017-01-29 MED ORDER — FAMOTIDINE 20 MG PO TABS: 20.0000 mg | ORAL_TABLET | Freq: Every day | ORAL | 1 refills | Status: DC

## 2017-01-29 MED ORDER — CLOPIDOGREL BISULFATE 75 MG PO TABS: 75.0000 mg | ORAL_TABLET | Freq: Every day | ORAL | 3 refills | Status: DC

## 2017-01-29 MED ORDER — METHOTREXATE 2.5 MG PO TABS: ORAL_TABLET | ORAL | 0 refills | Status: DC

## 2017-01-29 NOTE — Assessment & Plan Note (Addendum)
Overdue for Pap smear. Previous Pap negative in 2012. Uncertain last colonoscopy though chart review identifies colonoscopy with normal findings in 2009 with [facility]. --Patient given consent for release of information but fail to completely information prior to leaving, will need to follow-up during next visit in order to obtain colonoscopy results --RTC 1 month for Pap smear

## 2017-01-29 NOTE — Assessment & Plan Note (Addendum)
Chronic. Controlled. On DMARD's including methotrexate and sulfasalazine. Has scheduled appointment in 02/2017 with new rheumatologist. --Provided 1 month supply refill for methotrexate 2.5 mg twice per day on Monday and Tuesdays weekly and sulfasalazine 500 mg twice a day --Patient to follow up with your rheumatologist on 02/2017 --Will obtain CMP and CBC with differential

## 2017-01-29 NOTE — Patient Instructions (Addendum)
Thank you for coming in to see [place] today. Please see below to review our plan for today's visit.  1. I have refilled her prescriptions for your rheumatoid arthritis medications. Your new rheumatologist take over these medications when he follows with your scheduled appointment in September. I will call you the results of your blood tests if they are abnormal. 2. It is important you take your Plavix as recommended by her cardiologist due to your heart disease. I have refilled this prescription. This is safe to take with your HCTZ. If there is an issue with this medication concerning her daughter, please have her contact me at the clinic. 3. I have refilled her prescription for famotidine. Continue taking this with your Prilosec for reflux. We will recheck out to the GI doctor to obtain results from your colonoscopy. 4. Her overdue for your Pap smear. Please follow-up in one month to have this performed.  Return to clinic in one month for Pap smear.  Please call the clinic at [phone] if your symptoms worsen or you have any concerns. It was my pleasure to see you. -- Durward Parcel, DO [facility], PGY-2

## 2017-01-29 NOTE — Assessment & Plan Note (Deleted)
A 

## 2017-01-29 NOTE — Assessment & Plan Note (Addendum)
Chronic. Stable. Currently on aspirin informally on Plavix discontinued by patient's daughter who is concerned about interaction with HCTZ.  --Patient provided refill for Plavix 75 mg daily and continue aspirin 81 mg daily along with Lipitor 40 mg daily

## 2017-01-29 NOTE — Assessment & Plan Note (Addendum)
Chronic. Controlled. Currently on PPI and H2 blocker. --Given refill for famotidine 20 mg daily, continue Prilosec 40 mg daily

## 2017-01-30 LAB — CMP14+EGFR
A/G RATIO: 1 — AB (ref 1.2–2.2)
ALT: 25 IU/L (ref 0–32)
AST: 46 IU/L — AB (ref 0–40)
Albumin: 4.1 g/dL (ref 3.6–4.8)
Alkaline Phosphatase: 159 IU/L — ABNORMAL HIGH (ref 39–117)
BILIRUBIN TOTAL: 0.6 mg/dL (ref 0.0–1.2)
BUN/Creatinine Ratio: 12 (ref 12–28)
BUN: 12 mg/dL (ref 8–27)
CHLORIDE: 92 mmol/L — AB (ref 96–106)
CO2: 24 mmol/L (ref 20–29)
Calcium: 8.8 mg/dL (ref 8.7–10.3)
Creatinine, Ser: 1.03 mg/dL — ABNORMAL HIGH (ref 0.57–1.00)
GFR calc non Af Amer: 58 mL/min/{1.73_m2} — ABNORMAL LOW (ref >59)
GFR, EST AFRICAN AMERICAN: 67 mL/min/{1.73_m2} (ref >59)
Globulin, Total: 4 g/dL (ref 1.5–4.5)
Glucose: 104 mg/dL — ABNORMAL HIGH (ref 65–99)
POTASSIUM: 3.6 mmol/L (ref 3.5–5.2)
Sodium: 131 mmol/L — ABNORMAL LOW (ref 134–144)
Total Protein: 8.1 g/dL (ref 6.0–8.5)

## 2017-01-30 LAB — CBC WITH DIFFERENTIAL/PLATELET
BASOS ABS: 0 10*3/uL (ref 0.0–0.2)
Basos: 0 %
EOS (ABSOLUTE): 0.1 10*3/uL (ref 0.0–0.4)
Eos: 2 %
Hematocrit: 30.2 % — ABNORMAL LOW (ref 34.0–46.6)
Hemoglobin: 10.1 g/dL — ABNORMAL LOW (ref 11.1–15.9)
IMMATURE GRANS (ABS): 0 10*3/uL (ref 0.0–0.1)
Immature Granulocytes: 0 %
LYMPHS: 10 %
Lymphocytes Absolute: 0.5 10*3/uL — ABNORMAL LOW (ref 0.7–3.1)
MCH: 31.1 pg (ref 26.6–33.0)
MCHC: 33.4 g/dL (ref 31.5–35.7)
MCV: 93 fL (ref 79–97)
Monocytes Absolute: 0.3 10*3/uL (ref 0.1–0.9)
Monocytes: 6 %
NEUTROS ABS: 4.2 10*3/uL (ref 1.4–7.0)
NEUTROS PCT: 82 %
PLATELETS: 187 10*3/uL (ref 150–379)
RBC: 3.25 x10E6/uL — ABNORMAL LOW (ref 3.77–5.28)
RDW: 16 % — AB (ref 12.3–15.4)
WBC: 5.1 10*3/uL (ref 3.4–10.8)

## 2017-01-30 LAB — TSH: TSH: 2.7 u[IU]/mL (ref 0.450–4.500)

## 2017-02-03 ENCOUNTER — Encounter: Payer: Self-pay | Admitting: Family Medicine

## 2017-03-01 ENCOUNTER — Other Ambulatory Visit: Payer: Self-pay | Admitting: *Deleted

## 2017-03-01 DIAGNOSIS — M069 Rheumatoid arthritis, unspecified: Secondary | ICD-10-CM

## 2017-03-02 NOTE — Telephone Encounter (Signed)
I am trying to reach Ms. Fountaine to verify if she has been seen by rheumatology yet as they should be the one prescribing this medication. Unable to reach her and clarify.

## 2017-03-02 NOTE — Telephone Encounter (Signed)
Patient has an appointment on 03-08-17 with Dr. Abelardo Diesel. [name] Hartsell,CMA

## 2017-03-02 NOTE — Telephone Encounter (Signed)
2nd request.  Broxton Broady L, RN  

## 2017-03-03 MED ORDER — METHOTREXATE 2.5 MG PO TABS: ORAL_TABLET | ORAL | 0 refills | Status: DC

## 2017-03-07 NOTE — Progress Notes (Signed)
   Subjective:   Patient ID: Jocelyn Shelton    DOB: 04-11-1956, 61 y.o. female   MRN: [id number]  CC: "PAP smear"  HPI: Jocelyn Shelton is a 61 y.o. female who presents to clinic today for Pap smear. Problems discussed today are as follows:  Pap smear: Patient overdue for Pap smear with negative results on last 3 visits. Patient denies history of positive HPV in the past. She states she would like to continue receiving Pap smears beyond age 89 given her immunocompromise state. ROS: Denies fevers or chills, vaginal discharge, vaginal bleeding, vaginal pruritus, dysuria, constipation or diarrhea.  Rheumatoid arthritis with scleroderma: Patient states her arms intermittently heard bilaterally throughout. Continues to take her DMARD's including methotrexate and sulfasalazine. Has a few pills left over requesting refill. States she is to follow-up with her new rheumatologist next month. ROS: Denies chest pain, shortness of breath, fevers or chills.  Complete ROS performed, see HPI for pertinent.  PMFSH: RA with scleroderma, COPD, h/o NSTEMI, PVD with claudication, HTN, MDD. Surgical history LHC 04/2015 with stent placement, appendectomy. Family history MI, HTN, DM, lung cancer (borther age 12). Smoking status reviewed. Medications reviewed.  Objective:   BP (!) 92/58   Pulse (!) 103   Temp 98.4 F (36.9 C) (Oral)   Ht 5\' 6"  (1.676 m)   Wt 132 lb (59.9 kg)   BMI 21.31 kg/m  Vitals and nursing note reviewed.  General: well nourished, well developed, in no acute distress with non-toxic appearance CV: regular rate and rhythm without murmurs, rubs, or gallops, no lower extremity edema Lungs: clear to auscultation bilaterally with normal work of breathing Abdomen: soft, non-tender, non-distended, normoactive bowel sounds GU: accompanied by chaperone, no external lesions or rashes on exam, cervical os appreciated without friability or bleeding, nontender on digital exam without adnexal  tenderness Skin: warm, dry, no rashes or lesions, cap refill < 2 seconds Extremities: warm and well perfused, normal tone  Assessment & Plan:   Preventative health care Here for Pap screen. Previous Pap negative. Denies symptoms concerning for STD or vaginal bleeding. Has immunocompromise state given her rheumatoid arthritis. Patient opting to continue screening passed at age 60. --Pending endocervical cytology and concomitant HPV testing --If negative, next cervical screen will be in 5 years per patient's request  Rheumatoid arthritis (HCC) Chronic. Stable. No signs of acute flare. Patient continues to have mild pain mainly on upper extremities. Adherent to DMARDs. --Given 1 month supply of methotrexate and sulfasalazine --Patient to see new rheumatologists next month in [facility]  No orders of the defined types were placed in this encounter.  Meds ordered this encounter  Medications  . sulfaSALAzine (AZULFIDINE) 500 MG tablet    Sig: Take 2 tablets (1,000 mg total) by mouth 2 (two) times daily.    Dispense:  120 tablet    Refill:  0    Please consider 90 day supplies to promote better adherence  . methotrexate (RHEUMATREX) 2.5 MG tablet    Sig: TAKE 4 TABLETS (10MG ) BY MOUTH 2 TIMES A WEEK EVERY MONDAY AND TUESDAY    Dispense:  32 tablet    Refill:  0    Please consider 90 day supplies to promote better adherence    [facility], DO [facility], PGY-2 03/08/2017 2:47 PM

## 2017-03-08 ENCOUNTER — Ambulatory Visit (INDEPENDENT_AMBULATORY_CARE_PROVIDER_SITE_OTHER): Payer: Medicaid Other | Admitting: Family Medicine

## 2017-03-08 ENCOUNTER — Encounter: Payer: Self-pay | Admitting: Family Medicine

## 2017-03-08 ENCOUNTER — Other Ambulatory Visit (HOSPITAL_COMMUNITY)
Admission: RE | Admit: 2017-03-08 | Discharge: 2017-03-08 | Disposition: A | Discharge status: Home / Self Care | Payer: Medicaid Other | Source: Ambulatory Visit | Attending: Family Medicine | Admitting: Family Medicine

## 2017-03-08 VITALS — BP 92/58 | HR 103 | Temp 98.4°F | Ht 66.0 in | Wt 132.0 lb

## 2017-03-08 DIAGNOSIS — Z0000 Encounter for general adult medical examination without abnormal findings: Secondary | ICD-10-CM

## 2017-03-08 DIAGNOSIS — M069 Rheumatoid arthritis, unspecified: Secondary | ICD-10-CM

## 2017-03-08 MED ORDER — SULFASALAZINE 500 MG PO TABS: 1000.0000 mg | ORAL_TABLET | Freq: Two times a day (BID) | ORAL | 0 refills | Status: DC

## 2017-03-08 MED ORDER — METHOTREXATE 2.5 MG PO TABS: ORAL_TABLET | ORAL | 0 refills | Status: DC

## 2017-03-08 NOTE — Patient Instructions (Signed)
Thank you for coming in to see [place] today. Please see below to review our plan for today's visit.  1. You are now up to date on your Pap smear. I will call you if the results are abnormal, otherwise expect to receive results in the mail. 2. I placed a refill for your methotrexate and sulfasalazine for 1 month supply. This should help to you by until you follow-up with your new rheumatologist next month.  Return to clinic 3 months.  Please call the clinic at [phone] if your symptoms worsen or you have any concerns. It was my pleasure to see you. -- Durward Parcel, DO [facility], PGY-2

## 2017-03-08 NOTE — Assessment & Plan Note (Addendum)
Chronic. Stable. No signs of acute flare. Patient continues to have mild pain mainly on upper extremities. Adherent to DMARDs. --Given 1 month supply of methotrexate and sulfasalazine --Patient to see new rheumatologists next month in [facility]

## 2017-03-08 NOTE — Assessment & Plan Note (Addendum)
Here for Pap screen. Previous Pap negative. Denies symptoms concerning for STD or vaginal bleeding. Has immunocompromise state given her rheumatoid arthritis. Patient opting to continue screening passed at age 61. --Pending endocervical cytology and concomitant HPV testing --If negative, next cervical screen will be in 5 years per patient's request

## 2017-03-09 LAB — CYTOLOGY - PAP
DIAGNOSIS: NEGATIVE
HPV: NOT DETECTED

## 2017-03-10 ENCOUNTER — Encounter: Payer: Self-pay | Admitting: Family Medicine

## 2017-03-16 ENCOUNTER — Encounter (HOSPITAL_COMMUNITY): Payer: Self-pay | Admitting: Emergency Medicine

## 2017-03-16 ENCOUNTER — Inpatient Hospital Stay (HOSPITAL_COMMUNITY)
Admission: EM | Admit: 2017-03-16 | Discharge: 2017-03-18 | DRG: 641 | Disposition: A | Discharge status: Home / Self Care | Payer: Medicaid Other | Attending: Family Medicine | Admitting: Family Medicine

## 2017-03-16 ENCOUNTER — Emergency Department (HOSPITAL_COMMUNITY): Payer: Medicaid Other

## 2017-03-16 DIAGNOSIS — Z886 Allergy status to analgesic agent status: Secondary | ICD-10-CM | POA: Diagnosis not present

## 2017-03-16 DIAGNOSIS — I10 Essential (primary) hypertension: Secondary | ICD-10-CM | POA: Diagnosis present

## 2017-03-16 DIAGNOSIS — R011 Cardiac murmur, unspecified: Secondary | ICD-10-CM | POA: Diagnosis present

## 2017-03-16 DIAGNOSIS — I4581 Long QT syndrome: Secondary | ICD-10-CM | POA: Diagnosis present

## 2017-03-16 DIAGNOSIS — Z8249 Family history of ischemic heart disease and other diseases of the circulatory system: Secondary | ICD-10-CM | POA: Diagnosis not present

## 2017-03-16 DIAGNOSIS — E871 Hypo-osmolality and hyponatremia: Secondary | ICD-10-CM | POA: Diagnosis present

## 2017-03-16 DIAGNOSIS — R079 Chest pain, unspecified: Secondary | ICD-10-CM | POA: Diagnosis present

## 2017-03-16 DIAGNOSIS — I2510 Atherosclerotic heart disease of native coronary artery without angina pectoris: Secondary | ICD-10-CM

## 2017-03-16 DIAGNOSIS — G894 Chronic pain syndrome: Secondary | ICD-10-CM | POA: Diagnosis present

## 2017-03-16 DIAGNOSIS — Z888 Allergy status to other drugs, medicaments and biological substances status: Secondary | ICD-10-CM

## 2017-03-16 DIAGNOSIS — R531 Weakness: Secondary | ICD-10-CM | POA: Diagnosis not present

## 2017-03-16 DIAGNOSIS — R7989 Other specified abnormal findings of blood chemistry: Secondary | ICD-10-CM | POA: Diagnosis present

## 2017-03-16 DIAGNOSIS — I7300 Raynaud's syndrome without gangrene: Secondary | ICD-10-CM | POA: Diagnosis present

## 2017-03-16 DIAGNOSIS — Z7982 Long term (current) use of aspirin: Secondary | ICD-10-CM | POA: Diagnosis not present

## 2017-03-16 DIAGNOSIS — I25811 Atherosclerosis of native coronary artery of transplanted heart without angina pectoris: Secondary | ICD-10-CM | POA: Diagnosis not present

## 2017-03-16 DIAGNOSIS — Z79899 Other long term (current) drug therapy: Secondary | ICD-10-CM

## 2017-03-16 DIAGNOSIS — F17210 Nicotine dependence, cigarettes, uncomplicated: Secondary | ICD-10-CM | POA: Diagnosis present

## 2017-03-16 DIAGNOSIS — F339 Major depressive disorder, recurrent, unspecified: Secondary | ICD-10-CM | POA: Diagnosis present

## 2017-03-16 DIAGNOSIS — J449 Chronic obstructive pulmonary disease, unspecified: Secondary | ICD-10-CM | POA: Diagnosis present

## 2017-03-16 DIAGNOSIS — R778 Other specified abnormalities of plasma proteins: Secondary | ICD-10-CM | POA: Diagnosis present

## 2017-03-16 DIAGNOSIS — D649 Anemia, unspecified: Secondary | ICD-10-CM | POA: Diagnosis present

## 2017-03-16 DIAGNOSIS — K219 Gastro-esophageal reflux disease without esophagitis: Secondary | ICD-10-CM | POA: Diagnosis present

## 2017-03-16 DIAGNOSIS — J8410 Pulmonary fibrosis, unspecified: Secondary | ICD-10-CM | POA: Diagnosis present

## 2017-03-16 DIAGNOSIS — R748 Abnormal levels of other serum enzymes: Secondary | ICD-10-CM | POA: Diagnosis not present

## 2017-03-16 DIAGNOSIS — R0789 Other chest pain: Secondary | ICD-10-CM | POA: Diagnosis present

## 2017-03-16 DIAGNOSIS — M349 Systemic sclerosis, unspecified: Secondary | ICD-10-CM | POA: Diagnosis present

## 2017-03-16 DIAGNOSIS — I252 Old myocardial infarction: Secondary | ICD-10-CM | POA: Diagnosis not present

## 2017-03-16 DIAGNOSIS — Z7902 Long term (current) use of antithrombotics/antiplatelets: Secondary | ICD-10-CM

## 2017-03-16 DIAGNOSIS — Z955 Presence of coronary angioplasty implant and graft: Secondary | ICD-10-CM | POA: Diagnosis not present

## 2017-03-16 DIAGNOSIS — M069 Rheumatoid arthritis, unspecified: Secondary | ICD-10-CM | POA: Diagnosis present

## 2017-03-16 LAB — URINALYSIS, ROUTINE W REFLEX MICROSCOPIC
BACTERIA UA: NONE SEEN
BILIRUBIN URINE: NEGATIVE
Glucose, UA: NEGATIVE mg/dL
Hgb urine dipstick: NEGATIVE
Ketones, ur: NEGATIVE mg/dL
NITRITE: NEGATIVE
PH: 6 (ref 5.0–8.0)
Protein, ur: NEGATIVE mg/dL
RBC / HPF: NONE SEEN RBC/hpf (ref 0–5)
SPECIFIC GRAVITY, URINE: 1.003 — AB (ref 1.005–1.030)

## 2017-03-16 LAB — CBG MONITORING, ED: Glucose-Capillary: 122 mg/dL — ABNORMAL HIGH (ref 65–99)

## 2017-03-16 LAB — CBC
HCT: 27.4 % — ABNORMAL LOW (ref 36.0–46.0)
HEMOGLOBIN: 9.6 g/dL — AB (ref 12.0–15.0)
MCH: 31.5 pg (ref 26.0–34.0)
MCHC: 35 g/dL (ref 30.0–36.0)
MCV: 89.8 fL (ref 78.0–100.0)
PLATELETS: 230 10*3/uL (ref 150–400)
RBC: 3.05 MIL/uL — ABNORMAL LOW (ref 3.87–5.11)
RDW: 15.1 % (ref 11.5–15.5)
WBC: 5.8 10*3/uL (ref 4.0–10.5)

## 2017-03-16 LAB — I-STAT TROPONIN, ED: TROPONIN I, POC: 0.02 ng/mL (ref 0.00–0.08)

## 2017-03-16 LAB — COMPREHENSIVE METABOLIC PANEL
ALBUMIN: 3.8 g/dL (ref 3.5–5.0)
ALK PHOS: 140 U/L — AB (ref 38–126)
ALT: 20 U/L (ref 14–54)
ANION GAP: 8 (ref 5–15)
AST: 48 U/L — AB (ref 15–41)
BUN: 10 mg/dL (ref 6–20)
CALCIUM: 8.6 mg/dL — AB (ref 8.9–10.3)
CO2: 26 mmol/L (ref 22–32)
Chloride: 91 mmol/L — ABNORMAL LOW (ref 101–111)
Creatinine, Ser: 0.82 mg/dL (ref 0.44–1.00)
GFR calc Af Amer: >60 mL/min (ref >60)
GFR calc non Af Amer: >60 mL/min (ref >60)
GLUCOSE: 123 mg/dL — AB (ref 65–99)
POTASSIUM: 3.4 mmol/L — AB (ref 3.5–5.1)
SODIUM: 125 mmol/L — AB (ref 135–145)
Total Bilirubin: 0.5 mg/dL (ref 0.3–1.2)
Total Protein: 8 g/dL (ref 6.5–8.1)

## 2017-03-16 MED ORDER — MORPHINE SULFATE (PF) 2 MG/ML IV SOLN
2.0000 mg | Freq: Once | INTRAVENOUS | Status: AC
  Administered 2017-03-17: 2 mg via INTRAVENOUS
  Filled 2017-03-16: qty 1

## 2017-03-16 MED ORDER — MORPHINE SULFATE (PF) 2 MG/ML IV SOLN
2.0000 mg | Freq: Once | INTRAVENOUS | Status: AC
  Administered 2017-03-16: 2 mg via INTRAVENOUS
  Filled 2017-03-16: qty 1

## 2017-03-16 MED ORDER — SODIUM CHLORIDE 0.9 % IV BOLUS (SEPSIS)
1000.0000 mL | Freq: Once | INTRAVENOUS | Status: DC
  Administered 2017-03-16: 1000 mL via INTRAVENOUS

## 2017-03-16 NOTE — ED Provider Notes (Signed)
[facility]-EMERGENCY DEPT Provider Note   CSN: [id number] Arrival date & time: 03/16/17  1602     History   Chief Complaint Chief Complaint  Patient presents with  . Chest Pain  . weakness    HPI Jocelyn Shelton is a 61 y.o. female with PMH/o HTN, CAD, MI (2016) Who presents with 3 days of generalized weakness and intermittent chest pain. Patient reports that for the last 3 days, she has felt generalized weakness. Patient states that she feels like "she has no energy and feels like crap." Patient also reports that she has had some midsternal chest pain that has been intermittent for the last 3 days. She reports that pain is worsened with coughing. She states that pain is sometimes worsened with exertion. She states that pain is not worsened with deep inspiration. Patient also notes that her chronic rheumatoid arthritis pain has been worse over the last few days, most notably in her hands. She denies any recent trauma, fall, injury. Patient does have a history of an an NSTEMI. Patient states that prior to admission for an STEMI she did feel weak.  Patient denies any recent sickness. Patient denies any fevers/chills, difficulty breathing, abdominal pain, nausea/vomiting, swelling in her legs, redness/swelling of her joints, dysuria, hematuria. She states she is a current cigarette smoker and smokes roughly 5 cigarettes/day.  The history is provided by the patient.    Past Medical History:  Diagnosis Date  . CAD in native artery 05/23/2015  . Hypertension   . Leg pain    ABIs 1/18: R 1.2, L 1.1  . Reflux   . Rheumatoid arthritis(714.0)   . S/P angioplasty with stent, 05/22/15 pRCA DES and pLCX DES 05/23/2015  . Scleroderma [facility])     Patient Active Problem List   Diagnosis Date Noted  . Hyponatremia 03/16/2017  . Claudication in peripheral vascular disease (HCC) 10/28/2016  . Foot ulcer, right (HCC)   . Scleroderma (HCC) 03/08/2016  . Immunosuppressed status (HCC)   . COPD (chronic  obstructive pulmonary disease) (HCC) 08/30/2015  . Coronary artery disease involving native heart without angina pectoris 05/23/2015  . NSTEMI (non-ST elevated myocardial infarction) (HCC)   . Abdominal pain, acute   . Allergic rhinitis 12/03/2014  . Chronic pain syndrome 12/03/2014  . Preventative health care 03/26/2014  . Rheumatoid arthritis (HCC) 03/14/2013  . Other pancytopenia (HCC) 03/01/2013  . 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 Erythema nodosum 04/27/2011  . GERD (gastroesophageal reflux disease) 08/02/2008  . Major depressive disorder, recurrent episode (HCC) 09/23/2006  . Tobacco use disorder 09/23/2006  . Primary hypertension 09/23/2006  . Raynauds syndrome 09/23/2006    Past Surgical History:  Procedure Laterality Date  . APPENDECTOMY  90's  . CARDIAC CATHETERIZATION N/A 05/22/2015   Procedure: Left Heart Cath and Coronary Angiography;  Surgeon: Lennette Bihari, MD;  Location: [facility] INVASIVE CV LAB;  Service: Cardiovascular;  Laterality: N/A;  . CARDIAC CATHETERIZATION N/A 05/22/2015   Procedure: Coronary Stent Intervention;  Surgeon: Lennette Bihari, MD;  Location: [facility] INVASIVE CV LAB;  Service: Cardiovascular;  Laterality: N/A;    OB History    No data available       Home Medications    Prior to Admission medications   Medication Sig Start Date End Date Taking? Authorizing Provider  acetaminophen (TYLENOL) 500 MG tablet Take 1,000 mg by mouth daily as needed for pain.    Yes [provider]  aspirin EC 81 MG EC tablet Take 1 tablet (81  mg total) by mouth daily. 05/24/15  Yes Barrett, Joline Salt, PA-C  atorvastatin (LIPITOR) 40 MG tablet Take 1 tablet (40 mg total) by mouth daily at 6 PM. 07/01/16  Yes Weaver, Scott T, PA-C  ATROVENT HFA 17 MCG/ACT inhaler INHALE TWO PUFFS BY MOUTH ONCE DAILY AS NEEDED FOR WHEEZING 11/23/16  Yes Wendee Beavers, DO  Black Cohosh 40 MG CAPS Take 1 capsule by mouth 2 (two) times daily.   Yes [provider]  Calcium Carb-Cholecalciferol (CALCIUM 600+D3 PO) Take 1 tablet by mouth daily.   Yes [provider]  clopidogrel (PLAVIX) 75 MG tablet Take 1 tablet (75 mg total) by mouth daily. DO NOT START UNTIL YOU FINISH YOUR EFFIENT 01/29/17  Yes Wendee Beavers, DO  DIOVAN 80 MG tablet Take 1 tablet (80 mg total) by mouth daily. 07/08/16  Yes Weaver, Scott T, PA-C  dorzolamide-timolol (COSOPT) 22.3-6.8 MG/ML ophthalmic solution Place 1 drop into both eyes 2 (two) times daily.   Yes [provider]  famotidine (PEPCID) 20 MG tablet Take 1 tablet (20 mg total) by mouth at bedtime. 01/29/17  Yes Wendee Beavers, DO  fluticasone (FLONASE) 50 MCG/ACT nasal spray USE TWO SPRAY(S) IN EACH NOSTRIL DAILY AS NEEDED FOR  RHINITIS 07/25/15  Yes Karamalegos, Netta Neat, DO  folic acid (FOLVITE) 1 MG tablet TAKE 1 TABLET (1 MG TOTAL) BY MOUTH DAILY. 01/22/15  Yes Karamalegos, Netta Neat, DO  gabapentin (NEURONTIN) 100 MG capsule Take 1 capsule (100 mg total) by mouth 3 (three) times daily. 03/24/16  Yes Riccio, Angela C, DO  hydrochlorothiazide (HYDRODIURIL) 25 MG tablet Take 1 tablet (25 mg total) by mouth daily. 09/25/16  Yes Wendee Beavers, DO  methotrexate (RHEUMATREX) 2.5 MG tablet TAKE 4 TABLETS (10MG ) BY MOUTH 2 TIMES A WEEK EVERY MONDAY AND TUESDAY 03/08/17  Yes Wendee Beavers, DO  metoprolol (LOPRESSOR) 50 MG tablet Take 1 tablet (50 mg total) by mouth 2 (two) times daily. 07/01/16  Yes Weaver, Scott T, PA-C  Multiple Vitamin (MULTIVITAMIN WITH MINERALS) TABS tablet Take 1 tablet by mouth daily.   Yes [provider]  nitroGLYCERIN (NITROSTAT) 0.4 MG SL tablet Place 0.4 mg under the tongue every 5 (five) minutes as needed for chest pain.   Yes [provider]  OLANZapine (ZYPREXA) 5 MG tablet TAKE ONE TABLET BY MOUTH AT BEDTIME 12/25/16  Yes Wendee Beavers, DO  omeprazole (PRILOSEC) 40 MG capsule TAKE ONE CAPSULE BY MOUTH  DAILY 12/25/16  Yes Wendee Beavers,  DO  prasugrel (EFFIENT) 10 MG TABS tablet Take 1 tablet (10 mg total) by mouth daily. 06/25/16  Yes Pricilla Riffle, MD  PROVENTIL HFA 108 [phone] Base) MCG/ACT inhaler INHALE TWO PUFFS BY MOUTH EVERY 4 HOURS AS NEEDED FOR WHEEZING FOR COUGH FOR SHORTNESS OF BREATH 11/23/16  Yes Wendee Beavers, DO  sulfaSALAzine (AZULFIDINE) 500 MG tablet Take 2 tablets (1,000 mg total) by mouth 2 (two) times daily. 03/08/17  Yes Wendee Beavers, DO    Family History Family History  Problem Relation Age of Onset  . Hypertension Father   . Heart attack Mother   . Diabetes Daughter   . Diabetes Sister   . Lung cancer Brother 91    Social History Social History  Substance Use Topics  . Smoking status: Current Every Day Smoker    Packs/day: 0.25    Years: 40.00    Types: Cigarettes  . Smokeless tobacco: Never Used  . Alcohol use  2.4 oz/week    4 Standard drinks or equivalent per week     Allergies   Ticagrelor; Cymbalta [duloxetine hcl]; Aspirin; Lisinopril; and Tomato   Review of Systems Review of Systems  Constitutional: Negative for chills and fever.  Respiratory: Negative for cough and shortness of breath.   Cardiovascular: Negative for chest pain.  Gastrointestinal: Negative for abdominal pain, diarrhea, nausea and vomiting.  Genitourinary: Negative for dysuria and hematuria.  Musculoskeletal: Positive for arthralgias. Negative for back pain and neck pain.  Skin: Negative for color change.  Neurological: Positive for weakness (generalized). Negative for dizziness, numbness and headaches.  Psychiatric/Behavioral: Negative for confusion.  All other systems reviewed and are negative.    Physical Exam Updated Vital Signs BP (!) 165/97   Pulse (!) 109   Temp 98.7 F (37.1 C) (Oral)   Resp (!) 23   Wt 59.9 kg (132 lb)   SpO2 94%   BMI 21.31 kg/m   Physical Exam  Constitutional: She is oriented to person, place, and time. She appears well-developed and well-nourished.  Frail and  elderly-appearing  HENT:  Head: Normocephalic and atraumatic.  Mouth/Throat: Oropharynx is clear and moist. Mucous membranes are dry.  Eyes: Pupils are equal, round, and reactive to light. Conjunctivae, EOM and lids are normal.  Neck: Full passive range of motion without pain.  Cardiovascular: Regular rhythm, normal heart sounds and normal pulses.  Tachycardia present.  Exam reveals no gallop and no friction rub.   No murmur heard. Pulmonary/Chest: Effort normal and breath sounds normal.  No evidence of respiratory distress. Able to speak in full sentences without difficulty.  Abdominal: Soft. Normal appearance. There is no tenderness. There is no rigidity and no guarding.  Musculoskeletal: Normal range of motion.  Neurological: She is alert and oriented to person, place, and time. GCS eye subscore is 4. GCS verbal subscore is 5. GCS motor subscore is 6.  Cranial nerves III-XII intact Follows commands, Moves all extremities  5/5 strength to BUE and BLE  Sensation intact throughout all major nerve distributions Normal finger to nose. No dysdiadochokinesia. No pronator drift. Required assistance to ambulate to the bathroom No slurred speech. No facial droop.   Skin: Skin is warm and dry. Capillary refill takes less than 2 seconds.  Psychiatric: She has a normal mood and affect. Her speech is normal.  Nursing note and vitals reviewed.    ED Treatments / Results  Labs (all labs ordered are listed, but only abnormal results are displayed) Labs Reviewed  CBC - Abnormal; Notable for the following:       Result Value   RBC 3.05 (*)    Hemoglobin 9.6 (*)    HCT 27.4 (*)    All other components within normal limits  URINALYSIS, ROUTINE W REFLEX MICROSCOPIC - Abnormal; Notable for the following:    Color, Urine STRAW (*)    Specific Gravity, Urine 1.003 (*)    Leukocytes, UA SMALL (*)    Squamous Epithelial / LPF 0-5 (*)    All other components within normal limits  COMPREHENSIVE  METABOLIC PANEL - Abnormal; Notable for the following:    Sodium 125 (*)    Potassium 3.4 (*)    Chloride 91 (*)    Glucose, Bld 123 (*)    Calcium 8.6 (*)    AST 48 (*)    Alkaline Phosphatase 140 (*)    All other components within normal limits  CBG MONITORING, ED - Abnormal; Notable for the following:  Glucose-Capillary 122 (*)    All other components within normal limits  HIV ANTIBODY (ROUTINE TESTING)  I-STAT TROPONIN, ED    EKG  EKG Interpretation  Date/Time:  Tuesday March 16 2017 16:12:16 EDT Ventricular Rate:  100 PR Interval:    QRS Duration: 80 QT Interval:  377 QTC Calculation: 487 R Axis:   74 Text Interpretation:  Sinus tachycardia Probable left atrial enlargement RSR' in V1 or V2, probably normal variant Borderline prolonged QT interval No significant change since last tracing Confirmed by Richardean Canal [phone]) on 03/16/2017 7:39:19 PM       Radiology Dg Chest 2 View  Result Date: 03/16/2017 CLINICAL DATA:  Chest pain EXAM: CHEST  2 VIEW COMPARISON:  03/10/2016 FINDINGS: Cardiac shadow is at the upper limits of normal in size and stable. The lungs are well aerated bilaterally. Mild chronic scarring is seen in the bases bilaterally stable from previous exams. No acute infiltrate is seen. No bony abnormality is noted. IMPRESSION: Chronic changes in the bases without acute abnormality. Electronically Signed   By: Alcide Clever M.D.   On: 03/16/2017 16:30    Procedures Procedures (including critical care time)  Medications Ordered in ED Medications  morphine 2 MG/ML injection 2 mg (2 mg Intravenous Given 03/16/17 1743)     Initial Impression / Assessment and Plan / ED Course  I have reviewed the triage vital signs and the nursing notes.  Pertinent labs & imaging results that were available during my care of the patient were reviewed by me and considered in my medical decision making (see chart for details).     61 year old female who presents with 3 days  generalized weakness and intermittent chest pain. No shortness breath, no nausea/vomiting, no fevers, no dysuria hematuria. Patient is afebrile, non-toxic appearing. Vital signs reviewed. Patient is slightly hypertensive, likely secondary to pain. No neuro deficits on exam.  Consider acute infectious etiology versus an STEMI versus Plan to check basic labs including CBC, CMP, UA, EKG, troponin, chest x-ray.  Records reviewed. Patient had a left heart cath in October 2016, which showed RCA 80% stenosed, circumflex 80% stenosed. Patient had stents placed with improvement of stenosis.  Labs and imaging reviewed. Initial troponin is negative. CMP shows hyponatremia 125, hypokalemia at 3.4. Patient also has an elevation in her alkaline phosphatase. Records reviewed to this consistent with previous. Glucose is 122. CBC shows slight anemia 9.6/27.4. Records states that is consistent with previous. UA shows small leukocytes. Chest x-ray is negative for any acute infectious etiology. EKG is sinus tach with probable left atrial enlargement.  Given patient's history and presentation, she has a heart score of 5. Would likely benefit from serial troponins and evaluation. Will plan to consult cardiology for further documentation. Will consult hospitalist for admission.  Discussed with Dr. Delton See (on call for cardiology). Agrees with plan to admit. Recommends doing serial troponins and EKG for further evaluation. Would like patient nothing by mouth after midnight. Will plan to consult in the hospital. Will evaluate in the morning to decide if patient needs a stress test vs cath.  Discussed with Dr. Jonathon Jordan (family medicine resident). Will accept patient for admission. Aware of cardiology recommedations.  Recommends patient be transferred to [facility] for further evaluation and cardiac consult.   Updated patient on plan. She is agreeable to admission. Patient denies any pain at this time. Admission orders placed.    Re-evaluation. Patient still slightly hypertensive. Patient reports some mild bilateral hand pain associated with her  RA. Otherwise no complaints of pain.   Re-evaluation. Patient is resting comfortably in bed. She is responsive to verbal stimuli. She denies any pain at this time.   RN informed me that [facility] had arrived to transfer patient. As patient was being assisted onto the stretcher, she began complaining of some chest pain. I evaluated patient. Patient reported having some mid chest discomfort. Vitals reviewed. Patient slightly tachycardiac and hypertensive. O2 sats stable. No evidence of ST elevations on cardiac monitor. Patient A&Ox3. Analgesics provided in the department. Repeat I-stat tropinin drawn. Repeat EKG ordered. Plan to transfer patient after orders.   Repeat troponin was 0.30. Called Dr. Jonathon Jordan and informed her of elevated troponin. She is aware that patient is en route to [facility].  Final Clinical Impressions(s) / ED Diagnoses   Final diagnoses:  Hyponatremia  Generalized weakness    New Prescriptions New Prescriptions   No medications on file     Rosana Hoes 03/17/17 0305    Charlynne Pander, MD 03/22/17 [phone]

## 2017-03-16 NOTE — Progress Notes (Signed)
Family Medicine Teaching Service Transfer Acceptance Note  * Late Note*  Brief summary of reason for admission/transfer: Chest pain and weakness, ACS rule out Time accepted for transfer: 7:20 PM ED provider with whom patient discussed: Maxwell Caul, PA-C  Please note that patients transferring to [facility] under the [facility] Medicine Teaching Service must arrive at [facility] within two hours of acceptance for transfer.  It has been 4+ hours since receiving admission from [facility]. Patient has not had troponin cycled. I have ordered 1. Called [facility] and they stated [facility] is on their way to transfer her now.   Beaulah Dinning, MD PGY-3, Family Medicine Teaching Service Service Pager [phone]

## 2017-03-16 NOTE — ED Notes (Signed)
Bed: FY10 Expected date:  Expected time:  Means of arrival:  Comments: EMS 61 y/o weakness, bed bound

## 2017-03-16 NOTE — ED Notes (Signed)
Report called to [facility] 3W Christina,RN and called [facility] for transport.

## 2017-03-16 NOTE — ED Triage Notes (Signed)
Pt from home. Pt reports generalized weakness, generalized body pain, and central CP for the past 3 days. Pt reports her non-productive cough has been worse than usual for the past 3 days.  Pt denies SOB. Pt reports last time she felt like this, she had to have cardiac stents placed. Tried nitro this am, which helped. Has not taken home BP medication today.

## 2017-03-16 NOTE — ED Notes (Signed)
Pt in xray

## 2017-03-17 ENCOUNTER — Other Ambulatory Visit (HOSPITAL_COMMUNITY): Payer: Medicaid Other

## 2017-03-17 ENCOUNTER — Encounter (HOSPITAL_COMMUNITY): Payer: Self-pay | Admitting: Physician Assistant

## 2017-03-17 ENCOUNTER — Inpatient Hospital Stay (HOSPITAL_COMMUNITY): Payer: Medicaid Other

## 2017-03-17 DIAGNOSIS — R7989 Other specified abnormal findings of blood chemistry: Secondary | ICD-10-CM

## 2017-03-17 DIAGNOSIS — R778 Other specified abnormalities of plasma proteins: Secondary | ICD-10-CM | POA: Diagnosis present

## 2017-03-17 DIAGNOSIS — R079 Chest pain, unspecified: Secondary | ICD-10-CM

## 2017-03-17 DIAGNOSIS — I2510 Atherosclerotic heart disease of native coronary artery without angina pectoris: Secondary | ICD-10-CM

## 2017-03-17 DIAGNOSIS — R531 Weakness: Secondary | ICD-10-CM

## 2017-03-17 LAB — BASIC METABOLIC PANEL
Anion gap: 8 (ref 5–15)
BUN: 5 mg/dL — ABNORMAL LOW (ref 6–20)
CALCIUM: 8.4 mg/dL — AB (ref 8.9–10.3)
CO2: 26 mmol/L (ref 22–32)
Chloride: 96 mmol/L — ABNORMAL LOW (ref 101–111)
Creatinine, Ser: 0.64 mg/dL (ref 0.44–1.00)
GFR calc Af Amer: >60 mL/min (ref >60)
GFR calc non Af Amer: >60 mL/min (ref >60)
GLUCOSE: 99 mg/dL (ref 65–99)
POTASSIUM: 3.4 mmol/L — AB (ref 3.5–5.1)
Sodium: 130 mmol/L — ABNORMAL LOW (ref 135–145)

## 2017-03-17 LAB — NM MYOCAR MULTI W/SPECT W/WALL MOTION / EF
CSEPEW: 1 METS
CSEPPHR: 131 {beats}/min
Exercise duration (min): 5 min
Exercise duration (sec): 0 s
Rest HR: 92 {beats}/min

## 2017-03-17 LAB — IRON AND TIBC
Iron: 46 ug/dL (ref 28–170)
SATURATION RATIOS: 24 % (ref 10.4–31.8)
TIBC: 193 ug/dL — ABNORMAL LOW (ref 250–450)
UIBC: 147 ug/dL

## 2017-03-17 LAB — I-STAT TROPONIN, ED: Troponin i, poc: 0.3 ng/mL (ref 0.00–0.08)

## 2017-03-17 LAB — CBC
HEMATOCRIT: 26.9 % — AB (ref 36.0–46.0)
HEMOGLOBIN: 9 g/dL — AB (ref 12.0–15.0)
MCH: 30.5 pg (ref 26.0–34.0)
MCHC: 33.5 g/dL (ref 30.0–36.0)
MCV: 91.2 fL (ref 78.0–100.0)
Platelets: 214 10*3/uL (ref 150–400)
RBC: 2.95 MIL/uL — ABNORMAL LOW (ref 3.87–5.11)
RDW: 15.2 % (ref 11.5–15.5)
WBC: 4.2 10*3/uL (ref 4.0–10.5)

## 2017-03-17 LAB — TROPONIN I
TROPONIN I: 0.17 ng/mL — AB (ref <0.03)
Troponin I: 0.1 ng/mL (ref <0.03)
Troponin I: 0.24 ng/mL (ref <0.03)
Troponin I: 0.24 ng/mL (ref <0.03)

## 2017-03-17 LAB — MAGNESIUM: Magnesium: 1.6 mg/dL — ABNORMAL LOW (ref 1.7–2.4)

## 2017-03-17 LAB — RETICULOCYTES
RBC.: 2.95 MIL/uL — AB (ref 3.87–5.11)
RETIC CT PCT: 2.9 % (ref 0.4–3.1)
Retic Count, Absolute: 85.6 10*3/uL (ref 19.0–186.0)

## 2017-03-17 LAB — HIV ANTIBODY (ROUTINE TESTING W REFLEX): HIV SCREEN 4TH GENERATION: NONREACTIVE

## 2017-03-17 LAB — VITAMIN B12: Vitamin B-12: 574 pg/mL (ref 180–914)

## 2017-03-17 LAB — FOLATE: Folate: 13.4 ng/mL (ref >5.9)

## 2017-03-17 LAB — FERRITIN: Ferritin: 519 ng/mL — ABNORMAL HIGH (ref 11–307)

## 2017-03-17 MED ORDER — REGADENOSON 0.4 MG/5ML IV SOLN
INTRAVENOUS | Status: AC
  Filled 2017-03-17: qty 5

## 2017-03-17 MED ORDER — METOPROLOL TARTRATE 50 MG PO TABS
50.0000 mg | ORAL_TABLET | Freq: Two times a day (BID) | ORAL | Status: DC
  Administered 2017-03-17 – 2017-03-18 (×3): 50 mg via ORAL
  Filled 2017-03-17 (×3): qty 1

## 2017-03-17 MED ORDER — GUAIFENESIN-DM 100-10 MG/5ML PO SYRP
5.0000 mL | ORAL_SOLUTION | ORAL | Status: DC | PRN
  Administered 2017-03-17 – 2017-03-18 (×2): 5 mL via ORAL
  Filled 2017-03-17 (×2): qty 5

## 2017-03-17 MED ORDER — SODIUM CHLORIDE 0.9 % IV SOLN
INTRAVENOUS | Status: DC
  Administered 2017-03-17 – 2017-03-18 (×2): via INTRAVENOUS

## 2017-03-17 MED ORDER — OLANZAPINE 5 MG PO TABS
5.0000 mg | ORAL_TABLET | Freq: Every day | ORAL | Status: DC
  Administered 2017-03-17: 5 mg via ORAL
  Filled 2017-03-17: qty 1

## 2017-03-17 MED ORDER — SULFASALAZINE 500 MG PO TABS
1000.0000 mg | ORAL_TABLET | Freq: Two times a day (BID) | ORAL | Status: DC
  Administered 2017-03-17 – 2017-03-18 (×3): 1000 mg via ORAL
  Filled 2017-03-17 (×3): qty 2

## 2017-03-17 MED ORDER — ASPIRIN 81 MG PO CHEW: 324.0000 mg | CHEWABLE_TABLET | ORAL | Status: AC

## 2017-03-17 MED ORDER — POTASSIUM CHLORIDE CRYS ER 20 MEQ PO TBCR
40.0000 meq | EXTENDED_RELEASE_TABLET | Freq: Once | ORAL | Status: AC
  Administered 2017-03-17: 40 meq via ORAL
  Filled 2017-03-17: qty 2

## 2017-03-17 MED ORDER — TECHNETIUM TC 99M TETROFOSMIN IV KIT
30.0000 | PACK | Freq: Once | INTRAVENOUS | Status: AC | PRN
  Administered 2017-03-17: 30 via INTRAVENOUS

## 2017-03-17 MED ORDER — DORZOLAMIDE HCL-TIMOLOL MAL 2-0.5 % OP SOLN
1.0000 [drp] | Freq: Two times a day (BID) | OPHTHALMIC | Status: DC
  Administered 2017-03-17 – 2017-03-18 (×3): 1 [drp] via OPHTHALMIC
  Filled 2017-03-17: qty 10

## 2017-03-17 MED ORDER — IRBESARTAN 75 MG PO TABS
75.0000 mg | ORAL_TABLET | Freq: Every day | ORAL | Status: DC
  Administered 2017-03-17: 75 mg via ORAL
  Filled 2017-03-17: qty 1

## 2017-03-17 MED ORDER — FLUTICASONE PROPIONATE 50 MCG/ACT NA SUSP
2.0000 | Freq: Every day | NASAL | Status: DC | PRN
  Filled 2017-03-17: qty 16

## 2017-03-17 MED ORDER — TECHNETIUM TC 99M TETROFOSMIN IV KIT
10.0000 | PACK | Freq: Once | INTRAVENOUS | Status: AC | PRN
  Administered 2017-03-17: 10 via INTRAVENOUS

## 2017-03-17 MED ORDER — LOPERAMIDE HCL 2 MG PO CAPS
2.0000 mg | ORAL_CAPSULE | Freq: Once | ORAL | Status: AC
  Administered 2017-03-17: 2 mg via ORAL
  Filled 2017-03-17: qty 1

## 2017-03-17 MED ORDER — MORPHINE SULFATE (PF) 2 MG/ML IV SOLN: 2.0000 mg | INTRAVENOUS | Status: DC | PRN

## 2017-03-17 MED ORDER — IPRATROPIUM-ALBUTEROL 0.5-2.5 (3) MG/3ML IN SOLN: 3.0000 mL | RESPIRATORY_TRACT | Status: DC | PRN

## 2017-03-17 MED ORDER — ONDANSETRON HCL 4 MG/2ML IJ SOLN: 4.0000 mg | Freq: Four times a day (QID) | INTRAMUSCULAR | Status: DC | PRN

## 2017-03-17 MED ORDER — FAMOTIDINE 20 MG PO TABS
20.0000 mg | ORAL_TABLET | Freq: Every day | ORAL | Status: DC
  Administered 2017-03-17: 20 mg via ORAL
  Filled 2017-03-17: qty 1

## 2017-03-17 MED ORDER — ASPIRIN EC 81 MG PO TBEC
81.0000 mg | DELAYED_RELEASE_TABLET | Freq: Every day | ORAL | Status: DC
  Administered 2017-03-18: 81 mg via ORAL
  Filled 2017-03-17: qty 1

## 2017-03-17 MED ORDER — ASPIRIN 300 MG RE SUPP: 300.0000 mg | RECTAL | Status: AC

## 2017-03-17 MED ORDER — GABAPENTIN 100 MG PO CAPS
100.0000 mg | ORAL_CAPSULE | Freq: Three times a day (TID) | ORAL | Status: DC
Start: 2017-03-17 — End: 2017-03-18
  Administered 2017-03-17 – 2017-03-18 (×4): 100 mg via ORAL
  Filled 2017-03-17 (×4): qty 1

## 2017-03-17 MED ORDER — PANTOPRAZOLE SODIUM 40 MG PO TBEC
80.0000 mg | DELAYED_RELEASE_TABLET | Freq: Every day | ORAL | Status: DC
  Administered 2017-03-17 – 2017-03-18 (×2): 80 mg via ORAL
  Filled 2017-03-17 (×2): qty 2

## 2017-03-17 MED ORDER — IRBESARTAN 150 MG PO TABS
150.0000 mg | ORAL_TABLET | Freq: Every day | ORAL | Status: DC
  Administered 2017-03-18: 150 mg via ORAL
  Filled 2017-03-17: qty 1

## 2017-03-17 MED ORDER — REGADENOSON 0.4 MG/5ML IV SOLN
0.4000 mg | Freq: Once | INTRAVENOUS | Status: AC
  Administered 2017-03-17: 0.4 mg via INTRAVENOUS

## 2017-03-17 MED ORDER — METHOTREXATE 2.5 MG PO TABS: 10.0000 mg | ORAL_TABLET | ORAL | Status: DC

## 2017-03-17 MED ORDER — CLOPIDOGREL BISULFATE 75 MG PO TABS
75.0000 mg | ORAL_TABLET | Freq: Every day | ORAL | Status: DC
  Administered 2017-03-17 – 2017-03-18 (×2): 75 mg via ORAL
  Filled 2017-03-17 (×2): qty 1

## 2017-03-17 MED ORDER — NITROGLYCERIN 0.4 MG SL SUBL: 0.4000 mg | SUBLINGUAL_TABLET | SUBLINGUAL | Status: DC | PRN

## 2017-03-17 MED ORDER — ACETAMINOPHEN 325 MG PO TABS
650.0000 mg | ORAL_TABLET | ORAL | Status: DC | PRN
  Administered 2017-03-17 (×2): 650 mg via ORAL
  Filled 2017-03-17 (×2): qty 2

## 2017-03-17 MED ORDER — PRASUGREL HCL 10 MG PO TABS: 10.0000 mg | ORAL_TABLET | Freq: Every day | ORAL | Status: DC

## 2017-03-17 MED ORDER — ATORVASTATIN CALCIUM 40 MG PO TABS
40.0000 mg | ORAL_TABLET | Freq: Every day | ORAL | Status: DC
  Administered 2017-03-17: 40 mg via ORAL
  Filled 2017-03-17: qty 1

## 2017-03-17 NOTE — Progress Notes (Signed)
Nuclear stress test with false positive EKG and no ischemia on nuclear images.  CP likely musculoskeletal.  No further cardiac workup at this time. Will sign off.

## 2017-03-17 NOTE — Progress Notes (Signed)
Family Medicine Teaching Service Daily Progress Note Intern Pager: [phone]  Patient name: Jocelyn Shelton Medical record number: [id number] Date of birth: [birth year] Age: 61 y.o. Gender: female  Primary Care Provider: Wendee Beavers, DO Consultants: Cardiology Code Status: Full  Pt Overview and Major Events to Date:  Jocelyn M Harrisis a 61 y.o.femalewith medical history significant of Scleroderma, RA, COPD, CAD with MI in October 2016 s/p 2 stents  Assessment and Plan:   Chest pain: Improved. Not endorsing any chest pain this morning. Satting well on RAAdmission vitals showing patient afebrile, slightly hypertensive on admission to 149/77. Tachycardia to 110, normal respiratory rate 100% O2 on room air. EKG shows sinus tach with probable left atrial enlargement and QTC prolongation to 487. I-STAT troponin 0.02>0.30. Chest x-ray showing nothing acute. Hyponatremic to 125, K 3.4, chloride 91, ALP elevated 140 . Creatinine normal 0.82. Physical exam showing tired but alert elderly woman in no acute distress. Tachycardic and ?New systolic murmur heard throughout anterior chest.  Status post 0.2 mg morphine at [facility] ED. Less likely infectious etiology. Less likely musculoskeletal as pain is not reproducible. Heart score 4-5 making patient increase risk for cardiac etiology, additionally pain improved with nitro. [facility] ED PA discussed with on-call cardiology who wanted admission for serial troponins and possible stress testing. -Vital signs per floor protocol -Cardiology consulted, appreciate assistance, -Trend troponin, improving: 0.24 > 0.3 > 0.24, > 0.17 -AM EKG and PRN EKG for chest pain -Nitro PRN -ASA -Echo, pending -NPO, mIVF  Generalized Weakness: Improved. Na 130 today.  Normal neurological exam . Associated with the chest pain, could be secondary to possible ACS. Additionally Na low at 125 which may or may not be contributing. Could also be secondary to autoimmune etiology (hx  of scleroderma and RA) Last TSH normal 1 month ago.  - not endorsing weakness -Continue to monitor -PT/OT after ACS has been r/o  CAD: Has hx of CAD and hx of NSTEMI in 10/16 tx with 2 stents. Was started on Plavix and aspirin by cardiology. Per PCP note in July 2018, patient disontinued Plavix per daughter's recommendation as she thought it had a bad interaction with HCTZ.. On admit patient states she has been taking Plavix.  -Continue home statin -Continue home Plavix. Will not continue Prasugrel after discussion with pharmacy (this is on home meds, unsure if patient taking both antiplatelets at home) -Continue home Metoprolol and ARB  Previous abnormal Echo: 02/2016 Echo showing EF 60% to 65%, grade 1 diastolic dysfunction,  mildly to moderatelyincreased  PA peak pressure: 48 mm Hg (S). - Echo as above  ?New systolic murmur: Heard on admission exam - Echo  Hyponatremia:Improved Presented with a sodium of 125 on admission. Has had previous episodes of hyponatremia when admitted to the hospital improved with fluids. Unclear etiology. Euvolemic on exam. TSH normal in July 2018. -Consider urine studies for SIADH -Daily BMP - Fluid restrict overnight  History of scleroderma/pulmonary fibrosis/rheumatoid arthritis. -continue with sulfasalazine and MTX   History of COPD. Stable, lungs are clear -Continue nebulized bronchodilators 48mL inhaled Q4 prn  GERD -continue Protonix 80 mg daily  Normocytic Anemia: Worse. 9.0 today. Baseline hgb 8-9 in last year. 9.6 today. Likely anemia of chronic disease. Anemia panel, consistant w/ anemia of chronic disease -Cont to trend CBC  Tobacco Abuse: 5 cigs/day -Smoking cessation counseling -Nicotine patchPRN  FEN/GI: NPO with sips w/ meds, mIVF, ADAT after nuclear test Prophylaxis: SCDs  Disposition: Admit to family medicine teaching service, telemetry,  Disposition: telemtry, awaiting cardiac stress test  Subjective:  Resting  comfortably in bed. Improved weakness, not endorsing chest pain. No dizziness.   Objective: Temp:  [98.7 F (37.1 C)-99.6 F (37.6 C)] 99.6 F (37.6 C) (08/22 0526) Pulse Rate:  [99-111] 111 (08/22 0526) Resp:  [13-31] 16 (08/22 0526) BP: (148-179)/(77-135) 148/81 (08/22 0526) SpO2:  [91 %-100 %] 100 % (08/22 0526) Weight:  [132 lb (59.9 kg)-133 lb 11.2 oz (60.6 kg)] 133 lb 11.2 oz (60.6 kg) (08/22 0030) Physical Exam: General: resting comfortably in bed, NAD Cardiovascular: rrr, no mrg Respiratory: CTAB, satting well on RA Abdomen: soft, nontender Extremities: no lower extremity edema, 2+ dp pulses  Laboratory:  Recent Labs Lab 03/16/17 1614 03/17/17 0648  WBC 5.8 4.2  HGB 9.6* 9.0*  HCT 27.4* 26.9*  PLT 230 214    Recent Labs Lab 03/16/17 1624 03/17/17 0648  NA 125* 130*  K 3.4* 3.4*  CL 91* 96*  CO2 26 26  BUN 10 5*  CREATININE 0.82 0.64  CALCIUM 8.6* 8.4*  PROT 8.0  --   BILITOT 0.5  --   ALKPHOS 140*  --   ALT 20  --   AST 48*  --   GLUCOSE 123* 99      Imaging/Diagnostic Tests: Dg Chest 2 View  Result Date: 03/16/2017 CLINICAL DATA:  Chest pain EXAM: CHEST  2 VIEW COMPARISON:  03/10/2016 FINDINGS: Cardiac shadow is at the upper limits of normal in size and stable. The lungs are well aerated bilaterally. Mild chronic scarring is seen in the bases bilaterally stable from previous exams. No acute infiltrate is seen. No bony abnormality is noted. IMPRESSION: Chronic changes in the bases without acute abnormality. Electronically Signed   By: Alcide Clever M.D.   On: 03/16/2017 16:30    Garnette Gunner, MD 03/17/2017, 9:24 AM PGY-1, [facility] Family Medicine FPTS Intern pager: [phone], text pages welcome

## 2017-03-17 NOTE — Consult Note (Signed)
Cardiology Consultation:   Patient ID: Jocelyn Shelton; [id number]; [birth year]   Admit date: 03/16/2017 Date of Consult: 03/17/2017  Primary Care Provider: Wendee Beavers, DO Primary Cardiologist: Dr Tenny Craw, in-hospital 2016, Wende Mott, [facility] 06/2016 Primary Electrophysiologist:  n/a   Patient Profile:   Jocelyn Shelton is a 61 y.o. female with a hx of NSTEMI w/ DES CFX & RCA 2016, HTN, RA, GERD, scleroderma, tob use, mild COPD, who is being seen today for the evaluation of chest pain at the request of Dr Gwendolyn Grant.  History of Present Illness:   Ms. Nawabi was a little tired yesterday am, but otherwise ok. She got to feeling worse and worse over the course of the day.   She was coughing and got CP with the cough, says it did not otherwise hurt. She was coughing more than usual, non-productive. No fevers or chills. Her daughter said she looked bad and her activity level was down, so she needed to come to the ER.   Today, her cough is better and she has not had any chest pain. She has no hx exertional sx, but exertion is limited by hip pain. Does not walk much or do stairs. Occ daytime LE edema, no orthopnea or PND. Still smokes a little. No palpitations. Says scleroderma bothers her all over.    Past Medical History:  Diagnosis Date  . CAD in native artery 05/23/2015  . Hypertension   . Leg pain    ABIs 1/18: R 1.2, L 1.1  . Reflux   . Rheumatoid arthritis(714.0)   . S/P angioplasty with stent, 05/22/15 pRCA DES and pLCX DES 05/23/2015  . Scleroderma [facility])     Past Surgical History:  Procedure Laterality Date  . APPENDECTOMY  90's  . CARDIAC CATHETERIZATION N/A 05/22/2015   Procedure: Left Heart Cath and Coronary Angiography;  Surgeon: Lennette Bihari, MD;  Location: [facility] INVASIVE CV LAB;  Service: Cardiovascular;  Laterality: N/A;  . CARDIAC CATHETERIZATION N/A 05/22/2015   Procedure: Coronary Stent Intervention;  Surgeon: Lennette Bihari, MD;  Location: [facility] INVASIVE CV LAB;   Service: Cardiovascular;  Laterality: N/A;     Inpatient Medications: Scheduled Meds: . aspirin  324 mg Oral NOW   Or  . aspirin  300 mg Rectal NOW  . [START ON 03/18/2017] aspirin EC  81 mg Oral Daily  . atorvastatin  40 mg Oral q1800  . clopidogrel  75 mg Oral Daily  . dorzolamide-timolol  1 drop Both Eyes BID  . famotidine  20 mg Oral QHS  . gabapentin  100 mg Oral TID  . irbesartan  75 mg Oral Daily  . [START ON 03/22/2017] methotrexate  10 mg Oral Once per day on Mon Tue  . metoprolol tartrate  50 mg Oral BID  . OLANZapine  5 mg Oral QHS  . pantoprazole  80 mg Oral Daily  . sulfaSALAzine  1,000 mg Oral BID   Continuous Infusions:  PRN Meds: acetaminophen, fluticasone, ipratropium-albuterol, morphine injection, nitroGLYCERIN, ondansetron (ZOFRAN) IV Prior to Admission medications   Medication Sig Start Date End Date Taking? Authorizing Provider  acetaminophen (TYLENOL) 500 MG tablet Take 1,000 mg by mouth daily as needed for pain.    Yes [provider]  aspirin EC 81 MG EC tablet Take 1 tablet (81 mg total) by mouth daily. 05/24/15  Yes Nataki Mccrumb, Joline Salt, PA-C  atorvastatin (LIPITOR) 40 MG tablet Take 1 tablet (40 mg total) by mouth daily at 6 PM. 07/01/16  Yes  Tereso Newcomer T, PA-C  ATROVENT HFA 17 MCG/ACT inhaler INHALE TWO PUFFS BY MOUTH ONCE DAILY AS NEEDED FOR WHEEZING 11/23/16  Yes Wendee Beavers, DO  Black Cohosh 40 MG CAPS Take 1 capsule by mouth 2 (two) times daily.   Yes [provider]  Calcium Carb-Cholecalciferol (CALCIUM 600+D3 PO) Take 1 tablet by mouth daily.   Yes [provider]  clopidogrel (PLAVIX) 75 MG tablet Take 1 tablet (75 mg total) by mouth daily. DO NOT START UNTIL YOU FINISH YOUR EFFIENT 01/29/17  Yes Wendee Beavers, DO  DIOVAN 80 MG tablet Take 1 tablet (80 mg total) by mouth daily. 07/08/16  Yes Weaver, Scott T, PA-C  dorzolamide-timolol (COSOPT) 22.3-6.8 MG/ML ophthalmic solution Place 1 drop into both eyes 2 (two)  times daily.   Yes [provider]  famotidine (PEPCID) 20 MG tablet Take 1 tablet (20 mg total) by mouth at bedtime. 01/29/17  Yes Wendee Beavers, DO  fluticasone (FLONASE) 50 MCG/ACT nasal spray USE TWO SPRAY(S) IN EACH NOSTRIL DAILY AS NEEDED FOR  RHINITIS 07/25/15  Yes Karamalegos, Netta Neat, DO  folic acid (FOLVITE) 1 MG tablet TAKE 1 TABLET (1 MG TOTAL) BY MOUTH DAILY. 01/22/15  Yes Karamalegos, Netta Neat, DO  gabapentin (NEURONTIN) 100 MG capsule Take 1 capsule (100 mg total) by mouth 3 (three) times daily. 03/24/16  Yes Riccio, Angela C, DO  hydrochlorothiazide (HYDRODIURIL) 25 MG tablet Take 1 tablet (25 mg total) by mouth daily. 09/25/16  Yes Wendee Beavers, DO  methotrexate (RHEUMATREX) 2.5 MG tablet TAKE 4 TABLETS (10MG ) BY MOUTH 2 TIMES A WEEK EVERY MONDAY AND TUESDAY 03/08/17  Yes Wendee Beavers, DO  metoprolol (LOPRESSOR) 50 MG tablet Take 1 tablet (50 mg total) by mouth 2 (two) times daily. 07/01/16  Yes Weaver, Scott T, PA-C  Multiple Vitamin (MULTIVITAMIN WITH MINERALS) TABS tablet Take 1 tablet by mouth daily.   Yes [provider]  nitroGLYCERIN (NITROSTAT) 0.4 MG SL tablet Place 0.4 mg under the tongue every 5 (five) minutes as needed for chest pain.   Yes [provider]  OLANZapine (ZYPREXA) 5 MG tablet TAKE ONE TABLET BY MOUTH AT BEDTIME 12/25/16  Yes Wendee Beavers, DO  omeprazole (PRILOSEC) 40 MG capsule TAKE ONE CAPSULE BY MOUTH  DAILY 12/25/16  Yes Wendee Beavers, DO  prasugrel (EFFIENT) 10 MG TABS tablet Take 1 tablet (10 mg total) by mouth daily. 06/25/16  Yes Pricilla Riffle, MD  PROVENTIL HFA 108 [phone] Base) MCG/ACT inhaler INHALE TWO PUFFS BY MOUTH EVERY 4 HOURS AS NEEDED FOR WHEEZING FOR COUGH FOR SHORTNESS OF BREATH 11/23/16  Yes Wendee Beavers, DO  sulfaSALAzine (AZULFIDINE) 500 MG tablet Take 2 tablets (1,000 mg total) by mouth 2 (two) times daily. 03/08/17  Yes Wendee Beavers, DO    Allergies:    Allergies  Allergen Reactions    . Ticagrelor Shortness Of Breath  . Cymbalta [Duloxetine Hcl] Diarrhea and Nausea Only    No appetite, stomach pain  . Aspirin Other (See Comments)    Stomach cramps  . Lisinopril Cough  . Tomato Rash    Social History:   Social History   Social History  . Marital status: Single    Spouse name: N/A  . Number of children: 1  . Years of education: N/A   Occupational History  . Retired    Social History Main Topics  . Smoking status: Current Every Day Smoker    Packs/day: 0.25  Years: 40.00    Types: Cigarettes  . Smokeless tobacco: Never Used  . Alcohol use 2.4 oz/week    4 Standard drinks or equivalent per week  . Drug use: No  . Sexual activity: Not on file   Other Topics Concern  . Not on file   Social History Narrative  . No narrative on file    Family History:   The patient's family history includes Diabetes in her daughter and sister; Heart attack in her mother; Hypertension in her father; Lung cancer (age of onset: 24) in her brother. Pt indicated that her mother is deceased. She indicated that her father is deceased. She indicated that the status of her sister is unknown. She indicated that the status of her brother is unknown. She indicated that the status of her daughter is unknown.    ROS:  Please see the history of present illness.  All other ROS reviewed and negative.      Physical Exam/Data:   Vitals:   03/16/17 2230 03/16/17 2330 03/17/17 0030 03/17/17 0526  BP: (!) 163/96 (!) 158/88 (!) 149/77 (!) 148/81  Pulse:  (!) 107 (!) 102 (!) 111  Resp: 19 13 16 16   Temp:   99 F (37.2 C) 99.6 F (37.6 C)  TempSrc:   Oral Oral  SpO2:  100% 100% 100%  Weight:   133 lb 11.2 oz (60.6 kg)   Height:   5\' 6"  (1.676 m)    No intake or output data in the 24 hours ending 03/17/17 0904 Filed Weights   03/16/17 1612 03/17/17 0030  Weight: 132 lb (59.9 kg) 133 lb 11.2 oz (60.6 kg)   Body mass index is 21.58 kg/m.  General:  Well nourished, well  developed, in no acute distress HEENT: normal, edentulous Lymph: no adenopathy Neck: no JVD Endocrine:  No thryomegaly Vascular: No carotid bruits; 4/4 extremity pulses 2+ Cardiac:  normal S1, S2; RRR; no murmur  Lungs:  clear to auscultation bilaterally, slight wheezing, no rhonchi or rales  Abd: soft, nontender, no hepatomegaly  Ext: no edema Musculoskeletal:  No deformities, BUE and BLE strength normal and equal Skin: warm and dry  Neuro:  CNs 2-12 intact, no focal abnormalities noted Psych:  Normal affect   EKG:  The EKG was personally reviewed and demonstrates:  Sinus tach, no acute ischemic changes Telemetry:  Telemetry was personally reviewed and demonstrates:  SR, ST  Relevant CV Studies: Echo 03/10/16 - Left ventricle: The cavity size was normal. There was mild focal   basal hypertrophy of the septum. Systolic function was normal.   The estimated ejection fraction was in the range of 60% to 65%.   Wall motion was normal; there were no regional wall motion   abnormalities. Doppler parameters are consistent with abnormal   left ventricular relaxation (grade 1 diastolic dysfunction). - Mitral valve: There was trivial regurgitation. - Tricuspid valve: There was mild regurgitation. - Pulmonary arteries: Systolic pressure was mildly to moderately   increased. PA peak pressure: 48 mm Hg (S).  LHC 05/22/15 LCx proximal 80, distal 30 RCA proximal 80, RPDA 50 EF 55 with mild posterior basal HK and mild posterior lateral hypo contractility  PCI: Angiosculpt balloon +3 x 12 mm Synergy DES to the proximal LCx; 3.5 x 16 mm Synergy DES to the proximal RCA   Laboratory Data:  Chemistry  Recent Labs Lab 03/16/17 1624 03/17/17 0648  NA 125* 130*  K 3.4* 3.4*  CL 91* 96*  CO2 26 26  GLUCOSE 123* 99  BUN 10 5*  CREATININE 0.82 0.64  CALCIUM 8.6* 8.4*  GFRNONAA >60 >60  GFRAA >60 >60  ANIONGAP 8 8     Recent Labs Lab 03/16/17 1624  PROT 8.0  ALBUMIN 3.8  AST 48*    ALT 20  ALKPHOS 140*  BILITOT 0.5   Hematology  Recent Labs Lab 03/16/17 1614 03/17/17 0648  WBC 5.8 4.2  RBC 3.05* 2.95*  2.95*  HGB 9.6* 9.0*  HCT 27.4* 26.9*  MCV 89.8 91.2  MCH 31.5 30.5  MCHC 35.0 33.5  RDW 15.1 15.2  PLT 230 214   Cardiac Enzymes  Recent Labs Lab 03/16/17 2340 03/17/17 0202 03/17/17 0648  TROPONINI 0.24* 0.24* 0.17*     Recent Labs Lab 03/16/17 1652 03/17/17 0000  TROPIPOC 0.02 0.30*    Radiology/Studies:  Dg Chest 2 View  Result Date: 03/16/2017 CLINICAL DATA:  Chest pain EXAM: CHEST  2 VIEW COMPARISON:  03/10/2016 FINDINGS: Cardiac shadow is at the upper limits of normal in size and stable. The lungs are well aerated bilaterally. Mild chronic scarring is seen in the bases bilaterally stable from previous exams. No acute infiltrate is seen. No bony abnormality is noted. IMPRESSION: Chronic changes in the bases without acute abnormality. Electronically Signed   By: Alcide Clever M.D.   On: 03/16/2017 16:30    Assessment and Plan:   Active Problems: 1.  Hyponatremia - initial Na level 125, improved; will write for a 1 time dose of Kdur - per IM  2.  Chest pain - pleuritic in nature, no hx exertional sx - ECG not acute but troponin is mildly elevated - MD review and advise if MV or cath needed  3.  Elevated troponin - see above, may be due to non-cardiac causes   Signed, Theodore Demark, PA-C  03/17/2017 9:04 AM

## 2017-03-17 NOTE — H&P (Signed)
Family Medicine Teaching Service Hospital Admission History and Physical Service Pager: [phone]  Patient name: Jocelyn Shelton Medical record number: [id number] Date of birth: 09/20/1955 Age: 61 y.o. Gender: female  Primary Care Provider: McMullen, David J, DO Consultants: Cardiology  Code Status: Full (per discussion on admission)  Chief Complaint: Chest pain and generalized weakness  Assessment and Plan: Jocelyn Shelton is a 61 y.o. female with medical history significant of Scleroderma, RA, COPD, CAD with MI in October 2016 s/p 2 stents  Chest pain: Admission vitals showing patient afebrile, slightly hypertensive on admission to 149/77. Tachycardia to 110, normal respiratory rate 100% O2 on room air. EKG shows sinus tach with probable left atrial enlargement and QTC prolongation to 487. I-STAT troponin 0.02>0.30. Chest x-ray showing nothing acute. Hyponatremic to 125, K 3.4, chloride 91, ALP elevated 140 . Creatinine normal 0.82. Physical exam showing tired but alert elderly woman in no acute distress. Tachycardic and ?New systolic murmur heard throughout anterior chest.  Status post 0.2 mg morphine at [facility] ED. Less likely infectious etiology. Less likely musculoskeletal as pain is not reproducible. Heart score 4-5 making patient increase risk for cardiac etiology, additionally pain improved with nitro. [place] ED PA discussed with on-call cardiology who wanted admission for serial troponins and possible stress testing. -Admit to family medicine teaching service, telemetry, attending Dr. Walden -Vital signs per floor protocol -Cardiology consulted, appreciate assistance -Trend troponins -NPO after midnight -AM EKG and PRN EKG for chest pain - Morphine -O2  -Nitro PRN -ASA -Echo  Generalized Weakness: Normal neurological exam . Associated with the chest pain, could be secondary to possible ACS. Additionally Na low at 125 which may or may not be contributing. Could also be secondary to  autoimmune etiology (hx of scleroderma and RA) Last TSH normal 1 month ago.  -Continue to monitor -PT/OT after ACS has been r/o  CAD: Has hx of CAD and hx of NSTEMI in 10/16 tx with 2 stents. Was started on Plavix and aspirin by cardiology. Per PCP note in July 2018, patient disontinued Plavix per daughter's recommendation as she thought it had a bad interaction with HCTZ.. On admit patient states she has been taking Plavix.  -Continue home statin -Continue home Plavix. Will not continue Prasugrel after discussion with pharmacy (this is on home meds, unsure if patient taking both antiplatelets at home) -Continue home Metoprolol and ARB  Previous abnormal Echo: 02/2016 Echo showing EF 60% to 65%, grade 1 diastolic dysfunction,  mildly to moderately increased  PA peak pressure: 48 mm Hg (S). - Echo as above  ?New systolic murmur: Heard on admission exam - Echo  Hyponatremia: Presented with a sodium of 125 on admission. Has had previous episodes of hyponatremia when admitted to the hospital improved with fluids. Unclear etiology. Euvolemic on exam. TSH normal in July 2018. -Consider urine studies for SIADH -Daily BMP - Fluid restrict overnight   History of scleroderma/pulmonary fibrosis/rheumatoid arthritis. -continue with sulfasalazine and MTX    History of COPD. Stable, lungs are clear -Continue nebulized bronchodilators [id number] Shelton   GERD -continue Protonix 80 mg daily  Normocytic Anemia: Baseline hgb 8-9 in last year. 9.6 today. Likely anemia of chronic disease -Anemia panel  Tobacco Abuse: 5 cigs/day -Smoking cessation counseling -Nicotine patchPRN  FEN/GI: NPO with sips w/ meds, SLIV Prophylaxis: SCDs  Disposition: Admit to family medicine teaching service, telemetry, attending Dr. Walden  History of Present Illness:  Jocelyn Shelton is a 61 y.o. female presenting with chest  pain and weakness 3 days  Patient presented to [facility] ED today in the setting of  having 3 days of generalized weakness and constant, pressure-like, centralized chest pain. Chest pain is nonradiating for about 3 days and it "hurt more when coughing". Water made her chest pain a little better and nitroglycerin relieved the chest pain more than anything. No identifying aggravating factors.She's been "hurting all over  And just not feeling herself. She does admit to a cough that developed over the last couple days as well, nonproductive. Has not been having fevers at home. Said her appetite has been down over the last couple days as well and she has not eaten or drank much. She admits she has not taken her blood pressure medicines today.  She denies any shortness of breath, edema, nausea, vomiting, abdominal pain, diarrhea.  Review Of Systems: Per HPI with the following additions: See history of present illness  ROS  Patient Active Problem List   Diagnosis Date Noted  . Hyponatremia 03/16/2017  . Claudication in peripheral vascular disease (HCC) 10/28/2016  . Foot ulcer, right (HCC)   . Scleroderma (HCC) 03/08/2016  . Immunosuppressed status (HCC)   . COPD (chronic obstructive pulmonary disease) (HCC) 08/30/2015  . Coronary artery disease involving native heart without angina pectoris 05/23/2015  . NSTEMI (non-ST elevated myocardial infarction) (HCC)   . Abdominal pain, acute   . Allergic rhinitis 12/03/2014  . Chronic pain syndrome 12/03/2014  . Preventative health care 03/26/2014  . Rheumatoid arthritis (HCC) 03/14/2013  . Other pancytopenia (HCC) 03/01/2013  . 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 Erythema nodosum 04/27/2011  . GERD (gastroesophageal reflux disease) 08/02/2008  . Major depressive disorder, recurrent episode (HCC) 09/23/2006  . Tobacco use disorder 09/23/2006  . Primary hypertension 09/23/2006  . Raynauds syndrome 09/23/2006    Past Medical History: Past Medical History:  Diagnosis Date  . CAD in native artery 05/23/2015  .  Hypertension   . Leg pain    ABIs 1/18: R 1.2, L 1.1  . Reflux   . Rheumatoid arthritis(714.0)   . S/P angioplasty with stent, 05/22/15 pRCA DES and pLCX DES 05/23/2015  . Scleroderma [facility])     Past Surgical History: Past Surgical History:  Procedure Laterality Date  . APPENDECTOMY  90's  . CARDIAC CATHETERIZATION N/A 05/22/2015   Procedure: Left Heart Cath and Coronary Angiography;  Surgeon: Lennette Bihari, MD;  Location: [facility] INVASIVE CV LAB;  Service: Cardiovascular;  Laterality: N/A;  . CARDIAC CATHETERIZATION N/A 05/22/2015   Procedure: Coronary Stent Intervention;  Surgeon: Lennette Bihari, MD;  Location: [facility] INVASIVE CV LAB;  Service: Cardiovascular;  Laterality: N/A;    Social History: Social History  Substance Use Topics  . Smoking status: Current Every Day Smoker    Packs/day: 0.25    Years: 40.00    Types: Cigarettes  . Smokeless tobacco: Never Used  . Alcohol use 2.4 oz/week    4 Standard drinks or equivalent per week    Please also refer to relevant sections of EMR.  Family History: Family History  Problem Relation Age of Onset  . Hypertension Father   . Heart attack Mother   . Diabetes Daughter   . Diabetes Sister   . Lung cancer Brother 50   Allergies and Medications: Allergies  Allergen Reactions  . Ticagrelor Shortness Of Breath  . Cymbalta [Duloxetine Hcl] Diarrhea and Nausea Only    No appetite, stomach pain  . Aspirin Other (See Comments)  Stomach cramps  . Lisinopril Cough  . Tomato Rash   No current facility-administered medications on file prior to encounter.    Current Outpatient Prescriptions on File Prior to Encounter  Medication Sig Dispense Refill  . acetaminophen (TYLENOL) 500 MG tablet Take 1,000 mg by mouth daily as needed for pain.     Marland Kitchen aspirin EC 81 MG EC tablet Take 1 tablet (81 mg total) by mouth daily.    Marland Kitchen atorvastatin (LIPITOR) 40 MG tablet Take 1 tablet (40 mg total) by mouth daily at 6 PM. 90 tablet 3  . ATROVENT HFA  17 MCG/ACT inhaler INHALE TWO PUFFS BY MOUTH ONCE DAILY AS NEEDED FOR WHEEZING 1 Inhaler 3  . Black Cohosh 40 MG CAPS Take 1 capsule by mouth 2 (two) times daily.    . Calcium Carb-Cholecalciferol (CALCIUM 600+D3 PO) Take 1 tablet by mouth daily.    . clopidogrel (PLAVIX) 75 MG tablet Take 1 tablet (75 mg total) by mouth daily. DO NOT START UNTIL YOU FINISH YOUR EFFIENT 90 tablet 3  . DIOVAN 80 MG tablet Take 1 tablet (80 mg total) by mouth daily. 30 tablet 11  . dorzolamide-timolol (COSOPT) 22.3-6.8 MG/ML ophthalmic solution Place 1 drop into both eyes 2 (two) times daily.    . famotidine (PEPCID) 20 MG tablet Take 1 tablet (20 mg total) by mouth at bedtime. 30 tablet 1  . fluticasone (FLONASE) 50 MCG/ACT nasal spray USE TWO SPRAY(S) IN EACH NOSTRIL DAILY AS NEEDED FOR  RHINITIS 16 g 2  . folic acid (FOLVITE) 1 MG tablet TAKE 1 TABLET (1 MG TOTAL) BY MOUTH DAILY. 30 tablet 6  . gabapentin (NEURONTIN) 100 MG capsule Take 1 capsule (100 mg total) by mouth 3 (three) times daily. 30 capsule 2  . hydrochlorothiazide (HYDRODIURIL) 25 MG tablet Take 1 tablet (25 mg total) by mouth daily. 90 tablet 3  . methotrexate (RHEUMATREX) 2.5 MG tablet TAKE 4 TABLETS (10MG ) BY MOUTH 2 TIMES A WEEK EVERY MONDAY AND TUESDAY 32 tablet 0  . metoprolol (LOPRESSOR) 50 MG tablet Take 1 tablet (50 mg total) by mouth 2 (two) times daily. 180 tablet 3  . Multiple Vitamin (MULTIVITAMIN WITH MINERALS) TABS tablet Take 1 tablet by mouth daily.    . nitroGLYCERIN (NITROSTAT) 0.4 MG SL tablet Place 0.4 mg under the tongue every 5 (five) minutes as needed for chest pain.    OLANZapine (ZYPREXA) 5 MG tablet TAKE ONE TABLET BY MOUTH AT BEDTIME 30 tablet 5  . omeprazole (PRILOSEC) 40 MG capsule TAKE ONE CAPSULE BY MOUTH  DAILY 90 capsule 1  . prasugrel (EFFIENT) 10 MG TABS tablet Take 1 tablet (10 mg total) by mouth daily. 30 tablet 0  . PROVENTIL HFA 108 (90 Base) MCG/ACT inhaler INHALE TWO PUFFS BY MOUTH EVERY 4 HOURS AS NEEDED  FOR WHEEZING FOR COUGH FOR SHORTNESS OF BREATH 7 each 1  . sulfaSALAzine (AZULFIDINE) 500 MG tablet Take 2 tablets (1,000 mg total) by mouth 2 (two) times daily. 120 tablet 0    Objective: BP (!) 149/77 (BP Location: Left Arm)   Pulse (!) 102   Temp 99 F (37.2 C) (Oral)   Resp 16   Ht 5\' 6"  (1.676 m)   Wt 133 lb 11.2 oz (60.6 kg)   SpO2 100%   BMI 21.58 kg/m  Exam: General: Alert elderly female laying in bed in no acute distress Eyes: Pupils equal and reactive to light, nonicteric sclera ENTM: Slightly dry mucous membranes Neck: No JVD  Cardiovascular: Tachycardic rate, regular rhythm, systolic murmur heard throughout, normal S1-S2, no edema, 2+ DP pulses bilaterally Respiratory: Normal work of breathing, clear to auscultation bilaterally, intermittent coughing during exam Gastrointestinal: Soft, nondistended, nontender, normal bowel sounds MSK: Moves all extremities spontaneously Derm: No rash Neuro: Alert and oriented 3, no signs of facial asymmetry, 5 out 5 strength bilateral upper and lower extremities, sensation grossly intact throughout Psych: Normal mood and affect  Labs and Imaging: CBC BMET   Recent Labs Lab 03/16/17 1614  WBC 5.8  HGB 9.6*  HCT 27.4*  PLT 230    Recent Labs Lab 03/16/17 1624  NA 125*  K 3.4*  CL 91*  CO2 26  BUN 10  CREATININE 0.82  GLUCOSE 123*  CALCIUM 8.6*     Dg Chest 2 View  Result Date: 03/16/2017 CLINICAL DATA:  Chest pain EXAM: CHEST  2 VIEW COMPARISON:  03/10/2016 FINDINGS: Cardiac shadow is at the upper limits of normal in size and stable. The lungs are well aerated bilaterally. Mild chronic scarring is seen in the bases bilaterally stable from previous exams. No acute infiltrate is seen. No bony abnormality is noted. IMPRESSION: Chronic changes in the bases without acute abnormality. Electronically Signed   By: Alcide Clever M.D.   On: 03/16/2017 16:30    Beaulah Dinning, MD 03/17/2017, 1:02 AM PGY-3, [facility]  Family Medicine FPTS Intern pager: [phone], text pages welcome

## 2017-03-18 ENCOUNTER — Ambulatory Visit (HOSPITAL_COMMUNITY): Payer: Medicaid Other

## 2017-03-18 ENCOUNTER — Ambulatory Visit: Payer: Medicaid Other | Admitting: Internal Medicine

## 2017-03-18 ENCOUNTER — Other Ambulatory Visit (HOSPITAL_COMMUNITY): Payer: Medicaid Other

## 2017-03-18 DIAGNOSIS — R531 Weakness: Secondary | ICD-10-CM

## 2017-03-18 DIAGNOSIS — E871 Hypo-osmolality and hyponatremia: Principal | ICD-10-CM

## 2017-03-18 DIAGNOSIS — R079 Chest pain, unspecified: Secondary | ICD-10-CM

## 2017-03-18 DIAGNOSIS — R748 Abnormal levels of other serum enzymes: Secondary | ICD-10-CM

## 2017-03-18 DIAGNOSIS — I25811 Atherosclerosis of native coronary artery of transplanted heart without angina pectoris: Secondary | ICD-10-CM

## 2017-03-18 LAB — BASIC METABOLIC PANEL
Anion gap: 7 (ref 5–15)
BUN: 6 mg/dL (ref 6–20)
CALCIUM: 8 mg/dL — AB (ref 8.9–10.3)
CO2: 23 mmol/L (ref 22–32)
CREATININE: 0.72 mg/dL (ref 0.44–1.00)
Chloride: 102 mmol/L (ref 101–111)
GFR calc non Af Amer: >60 mL/min (ref >60)
Glucose, Bld: 91 mg/dL (ref 65–99)
Potassium: 3.9 mmol/L (ref 3.5–5.1)
Sodium: 132 mmol/L — ABNORMAL LOW (ref 135–145)

## 2017-03-18 LAB — CBC
HCT: 26 % — ABNORMAL LOW (ref 36.0–46.0)
Hemoglobin: 8.6 g/dL — ABNORMAL LOW (ref 12.0–15.0)
MCH: 30.9 pg (ref 26.0–34.0)
MCHC: 33.1 g/dL (ref 30.0–36.0)
MCV: 93.5 fL (ref 78.0–100.0)
PLATELETS: 204 10*3/uL (ref 150–400)
RBC: 2.78 MIL/uL — AB (ref 3.87–5.11)
RDW: 15.6 % — ABNORMAL HIGH (ref 11.5–15.5)
WBC: 3 10*3/uL — ABNORMAL LOW (ref 4.0–10.5)

## 2017-03-18 NOTE — Progress Notes (Signed)
FPTS Social Note  Visited pt regarding hospitalization for ACS work-up. Negative nuclear stress. Reassured pt cardiology believe MSK related. Echo pending. Scheduled hospital f/u with me 8/28. Pt w/o further questions. Discussed importance of f/u with new rheumatologist which pt endorses apt at end of month. Appreciate the great care FPTS is providing Ms. Kynley. Will continue to follow.  Wendee Beavers, DO 03/18/2017, 10:25 AM PGY-2,  Family Medicine

## 2017-03-18 NOTE — Discharge Summary (Signed)
Family Medicine Teaching [facility] Discharge Summary  Patient name: Jocelyn Shelton Medical record number: [id number] Date of birth: 1956-03-12 Age: 61 y.o. Gender: female Date of Admission: 03/16/2017  Date of Discharge: 03/18/2017 Admitting Physician: Tobey Grim, MD  Primary Care Provider: Wendee Beavers, DO Consultants: Cardiology  Indication for Hospitalization: Chest pain and generalized weakness  Discharge Diagnoses/Problem List:  Atypical chest pain,  Generalized weakness, resolved Hyponatremia, resolved CAD History of scleroderma H/o pulmonary fibrosis H/o rheumatoid arthritis GERD Normocytic Anemia Tobacco Abuse  Disposition: Home  Discharge Condition: Stable  Discharge Exam:  General: resting comfortably in bed, NAD Cardiovascular: rrr, no mrg Respiratory: CTAB, satting well on RA Abdomen: soft, nontender Extremities: no lower extremity edema, 2+ dp pulses  Brief Hospital Course:  Patient was admitted for 2 day history of chest pain and generalized weakness. On admission, patient was hypertensive, tachycardic. EKG shows sinus tach with probable left atrial enlargement and QTC prolongation to 487. Troponinss were slightly elevated to 0.3, but trended down to 0.1. Patient was also hypometric to 125. This improved with fluid resuscitation. Cardiology was consulted. To rule out, NSTEMI, patient received a nuclear stress test to that was normal. On discharge, patient's chest pain had resolved, vital signs were stable.   Issues for Follow Up:  1. Outpatient Cardiology Appointment. Pt had not seen her cardiologist in several years. Pt was advised to make an appointment with Dr. Tenny Craw for hospital follow up for chest pain.  2. Olanzapine - Pt was hyponatremic on admit. Improved. Euthyroid. Unknown etiology. It is a known side effect of olanzapine. Consider adjusting dosage and following BMP.  3. Rheumatology Appointment: Patient has rheumatology appointment on  09/11 in the morning. Please remind patient. Daughter is aware of the upcoming appointment.   Significant Procedures: Nuclear Stress Test  Significant Labs and Imaging:   Recent Labs Lab 03/16/17 1614 03/17/17 0648 03/18/17 0729  WBC 5.8 4.2 3.0*  HGB 9.6* 9.0* 8.6*  HCT 27.4* 26.9* 26.0*  PLT 230 214 204    Recent Labs Lab 03/16/17 1624 03/17/17 0648 03/18/17 0729  NA 125* 130* 132*  K 3.4* 3.4* 3.9  CL 91* 96* 102  CO2 26 26 23   GLUCOSE 123* 99 91  BUN 10 5* 6  CREATININE 0.82 0.64 0.72  CALCIUM 8.6* 8.4* 8.0*  MG  --  1.6*  --   ALKPHOS 140*  --   --   AST 48*  --   --   ALT 20  --   --   ALBUMIN 3.8  --   --     Dg Chest 2 View  Result Date: 03/16/2017 CLINICAL DATA:  Chest pain EXAM: CHEST  2 VIEW COMPARISON:  03/10/2016 FINDINGS: Cardiac shadow is at the upper limits of normal in size and stable. The lungs are well aerated bilaterally. Mild chronic scarring is seen in the bases bilaterally stable from previous exams. No acute infiltrate is seen. No bony abnormality is noted. IMPRESSION: Chronic changes in the bases without acute abnormality. Electronically Signed   By: 03/12/2016 M.D.   On: 03/16/2017 16:30   Nm Myocar Multi W/spect W/wall Motion / Ef  Result Date: 03/17/2017  ST segment depression was noted during stress in the II, III, aVF, V3, V4, V5 and V6 leads, and returning to baseline after 1-5 minutes of recovery. Very minimal but diffuse ST segment depressions  No T wave inversion was noted during stress.  The study is normal.  This is a  low risk study.  The left ventricular ejection fraction is normal (55-65%).  Normal pharmacologic nuclear stress test with no evidence for prior infarct or ischemia.    Results/Tests Pending at Time of Discharge: Echocardiogram  Discharge Medications:  Allergies as of 03/18/2017      Reactions   Ticagrelor Shortness Of Breath   Cymbalta [duloxetine Hcl] Diarrhea, Nausea Only   No appetite, stomach pain    Aspirin Other (See Comments)   Stomach cramps   Lisinopril Cough   Tomato Rash      Medication List    STOP taking these medications   prasugrel 10 MG Tabs tablet Commonly known as:  EFFIENT     TAKE these medications   acetaminophen 500 MG tablet Commonly known as:  TYLENOL Take 1,000 mg by mouth daily as needed for pain.   aspirin 81 MG EC tablet Take 1 tablet (81 mg total) by mouth daily.   atorvastatin 40 MG tablet Commonly known as:  LIPITOR Take 1 tablet (40 mg total) by mouth daily at 6 PM.   ATROVENT HFA 17 MCG/ACT inhaler Generic drug:  ipratropium INHALE TWO PUFFS BY MOUTH ONCE DAILY AS NEEDED FOR WHEEZING   Black Cohosh 40 MG Caps Take 1 capsule by mouth 2 (two) times daily.   CALCIUM 600+D3 PO Take 1 tablet by mouth daily.   clopidogrel 75 MG tablet Commonly known as:  PLAVIX Take 1 tablet (75 mg total) by mouth daily. DO NOT START UNTIL YOU FINISH YOUR EFFIENT   DIOVAN 80 MG tablet Generic drug:  valsartan Take 1 tablet (80 mg total) by mouth daily.   dorzolamide-timolol 22.3-6.8 MG/ML ophthalmic solution Commonly known as:  COSOPT Place 1 drop into both eyes 2 (two) times daily.   famotidine 20 MG tablet Commonly known as:  PEPCID Take 1 tablet (20 mg total) by mouth at bedtime.   fluticasone 50 MCG/ACT nasal spray Commonly known as:  FLONASE USE TWO SPRAY(S) IN EACH NOSTRIL DAILY AS NEEDED FOR  RHINITIS   folic acid 1 MG tablet Commonly known as:  FOLVITE TAKE 1 TABLET (1 MG TOTAL) BY MOUTH DAILY.   gabapentin 100 MG capsule Commonly known as:  NEURONTIN Take 1 capsule (100 mg total) by mouth 3 (three) times daily.   hydrochlorothiazide 25 MG tablet Commonly known as:  HYDRODIURIL Take 1 tablet (25 mg total) by mouth daily.   methotrexate 2.5 MG tablet Commonly known as:  RHEUMATREX TAKE 4 TABLETS (10MG ) BY MOUTH 2 TIMES A WEEK EVERY MONDAY AND TUESDAY   metoprolol tartrate 50 MG tablet Commonly known as:  LOPRESSOR Take 1 tablet  (50 mg total) by mouth 2 (two) times daily.   multivitamin with minerals Tabs tablet Take 1 tablet by mouth daily.   nitroGLYCERIN 0.4 MG SL tablet Commonly known as:  NITROSTAT Place 0.4 mg under the tongue every 5 (five) minutes as needed for chest pain.   OLANZapine 5 MG tablet Commonly known as:  ZYPREXA TAKE ONE TABLET BY MOUTH AT BEDTIME   omeprazole 40 MG capsule Commonly known as:  PRILOSEC TAKE ONE CAPSULE BY MOUTH  DAILY   PROVENTIL HFA 108 (90 Base) MCG/ACT inhaler Generic drug:  albuterol INHALE TWO PUFFS BY MOUTH EVERY 4 HOURS AS NEEDED FOR WHEEZING FOR COUGH FOR SHORTNESS OF BREATH   sulfaSALAzine 500 MG tablet Commonly known as:  AZULFIDINE Take 2 tablets (1,000 mg total) by mouth 2 (two) times daily.  Discharge Care Instructions        Start     Ordered   03/18/17 0000  Discharge instructions    Comments:  You were admitted for chest pain. Testing did not reveal any cardiac abnormalities. Chest pain is likely musculoskeletal.   03/18/17 1209      Discharge Instructions: Please refer to Patient Instructions section of EMR for full details.  Patient was counseled important signs and symptoms that should prompt return to medical care, changes in medications, dietary instructions, activity restrictions, and follow up appointments.   Follow-Up Appointments: Follow-up Information    Pricilla Riffle, MD. Schedule an appointment as soon as possible for a visit.   Specialty:  Cardiology Contact information: [address] [place] [address] [phone]        Wendee Beavers, DO. Go on 03/23/2017.   Specialty:  Family Medicine Why:  Appointment at [facility] Contact information: [address] [place] [address] [phone]           Garnette Gunner, MD 03/19/2017, 4:40 PM PGY-1, [facility]

## 2017-03-18 NOTE — Progress Notes (Signed)
Family Medicine Teaching Service Daily Progress Note Intern Pager: [phone]  Patient name: Jocelyn Shelton Medical record number: [id number] Date of birth: 28-May-1956 Age: 61 y.o. Gender: female  Primary Care Provider: Wendee Beavers, DO Consultants: Cardiology Code Status: Full  Pt Overview and Major Events to Date:  Jocelyn M Harrisis a 61 y.o.femalewith medical history significant of Scleroderma, RA, COPD, CAD with MI in October 2016 s/p 2 stents  Assessment and Plan:   Chest pain: Improved. RRR. Normal nuclear stress test. Troponins down trending to 0.1.  -Vital signs per floor protocol -Cardiology consulted, appreciate assistance, -Nitro PRN -ASA -Echo, pending -NPO, mIVF  Generalized Weakness: Improved. Na  today.  Normal neurological exam . - not endorsing weakness -Continue to monitor -PT/OT after ACS has been r/o  CAD: Has hx of CAD and hx of NSTEMI in 10/16 tx with 2 stents.  -Continue home statin -Continue home Plavix. Will not continue Prasugrel after discussion with pharmacy (this is on home meds, unsure if patient taking both antiplatelets at home) -Continue home Metoprolol and ARB  Previous abnormal Echo: 02/2016 Echo showing EF 60% to 65%, grade 1 diastolic dysfunction,  mildly to moderatelyincreased  PA peak pressure: 48 mm Hg (S). - Echo, pending  ?New systolic murmur: Resolved. No longer present on exam - Echo  Hyponatremia:Improved.  Presented with a sodium of 125 on admission. Has had previous episodes of hyponatremia when admitted to the hospital improved with fluids. Unclear etiology. Euvolemic on exam. TSH normal in July 2018. -Daily BMP   History of scleroderma/pulmonary fibrosis/rheumatoid arthritis. -continue with sulfasalazine and MTX   History of COPD. Stable, lungs are clear -Continue nebulized bronchodilators 29mL inhaled Q4 prn  GERD -continue Protonix 80 mg daily  Normocytic Anemia: Worse. 9.0 today. Baseline hgb 8-9 in  last year. 9.6 today. Likely anemia of chronic disease. Anemia panel, consistant w/ anemia of chronic disease -Cont to trend CBC  Tobacco Abuse: 5 cigs/day -Smoking cessation counseling -Nicotine patchPRN  FEN/GI: Full Prophylaxis: SCDs, ASA + Plavix  Disposition: home  Subjective:  Resting comfortably in bed. Improved weakness, not endorsing chest pain. No dizziness.   Objective: Temp:  [98.7 F (37.1 C)-99.1 F (37.3 C)] 98.9 F (37.2 C) (08/23 0541) Pulse Rate:  [76-86] 80 (08/23 0541) Resp:  [17] 17 (08/23 0541) BP: (121-155)/(64-79) 121/73 (08/23 0541) SpO2:  [98 %-100 %] 98 % (08/23 0541) Weight:  [134 lb (60.8 kg)] 134 lb (60.8 kg) (08/23 0541) Physical Exam: General: resting comfortably in bed, NAD Cardiovascular: rrr, no mrg Respiratory: CTAB, satting well on RA Abdomen: soft, nontender Extremities: no lower extremity edema, 2+ dp pulses  Laboratory:  Recent Labs Lab 03/16/17 1614 03/17/17 0648  WBC 5.8 4.2  HGB 9.6* 9.0*  HCT 27.4* 26.9*  PLT 230 214    Recent Labs Lab 03/16/17 1624 03/17/17 0648  NA 125* 130*  K 3.4* 3.4*  CL 91* 96*  CO2 26 26  BUN 10 5*  CREATININE 0.82 0.64  CALCIUM 8.6* 8.4*  PROT 8.0  --   BILITOT 0.5  --   ALKPHOS 140*  --   ALT 20  --   AST 48*  --   GLUCOSE 123* 99      Imaging/Diagnostic Tests: Dg Chest 2 View  Result Date: 03/16/2017 CLINICAL DATA:  Chest pain EXAM: CHEST  2 VIEW COMPARISON:  03/10/2016 FINDINGS: Cardiac shadow is at the upper limits of normal in size and stable. The lungs are well aerated bilaterally. Mild chronic scarring  is seen in the bases bilaterally stable from previous exams. No acute infiltrate is seen. No bony abnormality is noted. IMPRESSION: Chronic changes in the bases without acute abnormality. Electronically Signed   By: Alcide Clever M.D.   On: 03/16/2017 16:30    Garnette Gunner, MD 03/18/2017, 6:54 AM PGY-1, [facility] Family Medicine FPTS Intern pager: [phone], text  pages welcome

## 2017-03-21 ENCOUNTER — Other Ambulatory Visit: Payer: Self-pay | Admitting: Family Medicine

## 2017-03-21 DIAGNOSIS — M069 Rheumatoid arthritis, unspecified: Secondary | ICD-10-CM

## 2017-03-23 ENCOUNTER — Inpatient Hospital Stay: Payer: Medicaid Other | Admitting: Family Medicine

## 2017-03-26 NOTE — Progress Notes (Signed)
Cardiology Office Note   Date:  04/02/2017   ID:  Jocelyn Shelton, DOB [birth year], MRN [id number]  PCP:  Wendee Beavers, DO  Cardiologist:   Dietrich Pates, MD   F?U of CAD      History of Present Illness: Jocelyn Shelton is a 61 y.o. female with a history of CAD (s/p NSTEMI in 2016:  Cath pt underwent PCI/DES to P RCA; PCI/DES to LCx)  ALso a history of HTN, SOB, hyponatremia   She was admitted in Auguust with cough and sudden CP  Relieved with NTG   Stress test recommended   No ischemia noted on stress test    Patient denies CP  Breathing is good   Sees Dr Abelardo Diesel  Has scleroderma   Occasional reflux   Still has cough    Current Meds  Medication Sig  . acetaminophen (TYLENOL) 500 MG tablet Take 1,000 mg by mouth daily as needed for pain.   Marland Kitchen aspirin EC 81 MG EC tablet Take 1 tablet (81 mg total) by mouth daily.  Marland Kitchen atorvastatin (LIPITOR) 40 MG tablet Take 1 tablet (40 mg total) by mouth daily at 6 PM.  . ATROVENT HFA 17 MCG/ACT inhaler INHALE TWO PUFFS BY MOUTH ONCE DAILY AS NEEDED FOR WHEEZING  . Black Cohosh 40 MG CAPS Take 1 capsule by mouth 2 (two) times daily.  . Calcium Carb-Cholecalciferol (CALCIUM 600+D3 PO) Take 1 tablet by mouth daily.  . clopidogrel (PLAVIX) 75 MG tablet Take 1 tablet (75 mg total) by mouth daily. DO NOT START UNTIL YOU FINISH YOUR EFFIENT  . DIOVAN 80 MG tablet Take 1 tablet (80 mg total) by mouth daily.  . dorzolamide-timolol (COSOPT) 22.3-6.8 MG/ML ophthalmic solution Place 1 drop into both eyes 2 (two) times daily.  . famotidine (PEPCID) 20 MG tablet Take 1 tablet (20 mg total) by mouth at bedtime.  . fluticasone (FLONASE) 50 MCG/ACT nasal spray USE TWO SPRAY(S) IN EACH NOSTRIL DAILY AS NEEDED FOR  RHINITIS  . folic acid (FOLVITE) 1 MG tablet TAKE 1 TABLET (1 MG TOTAL) BY MOUTH DAILY.  . hydrochlorothiazide (HYDRODIURIL) 25 MG tablet Take 1 tablet (25 mg total) by mouth daily.  . meloxicam (MOBIC) 15 MG tablet Take 1 tablet (15 mg total) by mouth  daily.  . methotrexate (RHEUMATREX) 2.5 MG tablet TAKE 4 TABLETS (10MG ) BY MOUTH 2 TIMES A WEEK EVERY MONDAY AND TUESDAY  . metoprolol (LOPRESSOR) 50 MG tablet Take 1 tablet (50 mg total) by mouth 2 (two) times daily.  . Multiple Vitamin (MULTIVITAMIN WITH MINERALS) TABS tablet Take 1 tablet by mouth daily.  . nitroGLYCERIN (NITROSTAT) 0.4 MG SL tablet Place 0.4 mg under the tongue every 5 (five) minutes as needed for chest pain.  omeprazole (PRILOSEC) 40 MG capsule TAKE ONE CAPSULE BY MOUTH  DAILY  . PROVENTIL HFA 108 (90 Base) MCG/ACT inhaler INHALE TWO PUFFS BY MOUTH EVERY 4 HOURS AS NEEDED FOR WHEEZING FOR COUGH FOR SHORTNESS OF BREATH  . sulfaSALAzine (AZULFIDINE) 500 MG tablet TAKE 2 TABLETS TWICE A DAY     Allergies:   Ticagrelor; Cymbalta [duloxetine hcl]; Olanzapine; Aspirin; Lisinopril; and Tomato   Past Medical History:  Diagnosis Date  . CAD in native artery 05/23/2015  . Hypertension   . Leg pain    ABIs 1/18: R 1.2, L 1.1  . Reflux   . Rheumatoid arthritis(714.0)   . S/P angioplasty with stent, 05/22/15 pRCA DES and pLCX DES 05/23/2015  . Scleroderma (HCC)  Past Surgical History:  Procedure Laterality Date  . APPENDECTOMY  90's  . CARDIAC CATHETERIZATION N/A 05/22/2015   Procedure: Left Heart Cath and Coronary Angiography;  Surgeon: Lennette Bihari, MD;  Location: [facility] INVASIVE CV LAB;  Service: Cardiovascular;  Laterality: N/A;  . CARDIAC CATHETERIZATION N/A 05/22/2015   Procedure: Coronary Stent Intervention;  Surgeon: Lennette Bihari, MD;  Location: [facility] INVASIVE CV LAB;  Service: Cardiovascular;  Laterality: N/A;     Social History:  The patient  reports that she has been smoking Cigarettes.  She has a 10.00 pack-year smoking history. She has never used smokeless tobacco. She reports that she drinks about 2.4 oz of alcohol per week . She reports that she does not use drugs.   Family History:  The patient's family history includes Diabetes in her daughter and  sister; Heart attack in her mother; Hypertension in her father; Lung cancer (age of onset: 11) in her brother.    ROS:  Please see the history of present illness. All other systems are reviewed and  Negative to the above problem except as noted.    PHYSICAL EXAM: VS:  BP 112/70   Pulse 70   Ht 5\' 6"  (1.676 m)   Wt 130 lb (59 kg)   BMI 20.98 kg/m   GEN: Well nourished, well developed, in no acute distress  HEENT: normal  Neck: no JVD, carotid bruits, or masses Cardiac: RRR; no murmurs, rubs, or gallops,no edema  Respiratory:  clear to auscultation bilaterally, normal work of breathing GI: soft, nontender, nondistended, + BS  No hepatomegaly  MS: no deformity Moving all extremities   Skin: warm and dry, no rash Neuro:  Strength and sensation are intact Psych: euthymic mood, full affect   EKG:  EKG is not ordered today.   Lipid Panel    Component Value Date/Time   CHOL 79 07/14/2016 0835   TRIG 83 07/14/2016 0835   HDL 23 (L) 07/14/2016 0835   CHOLHDL 3.4 07/14/2016 0835   VLDL 17 07/14/2016 0835   LDLCALC 39 07/14/2016 0835      Wt Readings from Last 3 Encounters:  04/02/17 130 lb (59 kg)  03/30/17 130 lb (59 kg)  03/18/17 134 lb (60.8 kg)      ASSESSMENT AND PLAN:  1  CAD    No symptoms of angina  Keep on current regimen  Check CBC on Sept 18  Set up for appt in Dec  2  HTN  Follow    3  HL  Lipids are good    4   Hyponatremia   Pt due to have na checked on 9/18  5  Cough  On omeprazole   F/U with IM  6  Anemia  Repeat CBC on 9.18  Follow  Continue ASA and PLAvix     Current medicines are reviewed at length with the patient today.  The patient does not have concerns regarding medicines.  Signed, 10/18, MD  04/02/2017 3:37 PM    [facility] [address] [place], [place], [place]  [place] Phone: [phone]; Fax: [phone]

## 2017-03-30 ENCOUNTER — Ambulatory Visit (INDEPENDENT_AMBULATORY_CARE_PROVIDER_SITE_OTHER): Payer: Medicaid Other | Admitting: Family Medicine

## 2017-03-30 ENCOUNTER — Encounter: Payer: Self-pay | Admitting: Family Medicine

## 2017-03-30 VITALS — BP 110/58 | HR 100 | Temp 98.2°F | Wt 130.0 lb

## 2017-03-30 DIAGNOSIS — E871 Hypo-osmolality and hyponatremia: Secondary | ICD-10-CM | POA: Diagnosis not present

## 2017-03-30 DIAGNOSIS — M069 Rheumatoid arthritis, unspecified: Secondary | ICD-10-CM | POA: Diagnosis not present

## 2017-03-30 DIAGNOSIS — I2510 Atherosclerotic heart disease of native coronary artery without angina pectoris: Secondary | ICD-10-CM

## 2017-03-30 DIAGNOSIS — I214 Non-ST elevation (NSTEMI) myocardial infarction: Secondary | ICD-10-CM

## 2017-03-30 MED ORDER — MELOXICAM 15 MG PO TABS: 15.0000 mg | ORAL_TABLET | Freq: Every day | ORAL | 0 refills | Status: DC

## 2017-03-30 NOTE — Assessment & Plan Note (Addendum)
Chronic. ACS workup unremarkable. No signs of angina. --Patient to follow-up with Dr. Tenny Craw on 9/7 --Continue ASA 81 mg daily, atorvastatin 40 mg daily, and plavix 75 mg daily

## 2017-03-30 NOTE — Progress Notes (Signed)
   Subjective:   Patient ID: Jocelyn Shelton    DOB: 08-21-1955, 61 y.o. female   MRN: [id number]  CC: "Hospital follow-up"  HPI: Jocelyn Shelton is a 61 y.o. female who presents to clinic today for hospital follow-up after admission for chest pain workup. Problems discussed today are as follows:  Chest pain: Patient recently admitted for ACS workup given heart score and history of NSTEMI. Echo performed unremarkable. Patient discharged with instructions to follow-up with cardiologist. Chest pain has since resolved.  ROS: Denies chest pain, shortness of breath, palpitations, feelings of syncope.  Right wrist pain: Chronic tissue usually involving both wrists but now only affecting right wrist. He shouldn't says she has been taking her DMARD's. She also takes Tylenol with minimal relief. She has an appointment with a new rheumatologist in 1 week. Pain has been restricting her ability to cook and wash dishes for a prolonged period of time. ROS: Denies fevers or chills.  Hyponatremia: Patient told she had low sodium levels during hospitalization. Patient states she tries to keep well-hydrated. She states she does not have a history of low salt levels. ROS: Denies feelings of fatigue, decreased cognition.  Complete ROS performed, see HPI for pertinent.  [place]: RA+scleroderma, COPD, PVD with claudication and h/o NSTEMI, HTN, MDD. Surgical history LHC 04/2015 with stent placement, appendectomy. Family history MI, HTN, DM, lung cancer (borther age 71). Smoking status reviewed. Medications reviewed.  Objective:   BP (!) 110/58   Pulse 100   Temp 98.2 F (36.8 C) (Oral)   Wt 130 lb (59 kg)   SpO2 98%   BMI 20.98 kg/m  Vitals and nursing note reviewed.  General: well nourished, well developed, in no acute distress with non-toxic appearance HEENT: normocephalic, atraumatic, moist mucous membranes CV: regular rate and rhythm without murmurs, rubs, or gallops, no lower extremity edema Lungs:  clear to auscultation bilaterally with normal work of breathing Skin: warm, dry, no rashes or lesions, cap refill < 2 seconds Extremities: warm and well perfused, normal tone, right wrist mildly swollen without erythema or calor, full ROM with mild tenderness present  Assessment & Plan:   Rheumatoid arthritis (HCC) Chronic. No signs of acute flare. Pain likely suboptimal control of RA with DMARD's. --Continue methotrexate and sulfasalazine as prescribed with intentions of following up with the rheumatologist on 9/11 for further management --Given Mobic 15 mg daily as needed for pain is temporary means with intentions of discontinuing --Patient to schedule screening colonoscopy, form given  Coronary artery disease involving native coronary artery of native heart without angina pectoris Chronic. ACS workup unremarkable. No signs of angina. --Patient to follow-up with Dr. Harrington Challenger on 9/7 --Continue ASA 81 mg daily, atorvastatin 40 mg daily, and plavix 75 mg daily  Hyponatremia Acute. The finding during hospitalization. Improved prior to discharge. Uncertain etiology though olanzapine is possible. --Will obtain CBC with differential --Continue olanzapine 5 mg daily for now, will discontinue if hyponatremia persists  Orders Placed This Encounter  Procedures  . CBC with Differential/Platelet  . CMP14+EGFR   Meds ordered this encounter  Medications  . meloxicam (MOBIC) 15 MG tablet    Sig: Take 1 tablet (15 mg total) by mouth daily.    Dispense:  30 tablet    Refill:  0    Harriet Butte, [place], PGY-2 03/30/2017 9:20 AM

## 2017-03-30 NOTE — Assessment & Plan Note (Addendum)
Acute. The finding during hospitalization. Improved prior to discharge. Uncertain etiology though olanzapine is possible. --Will obtain CBC with differential --Continue olanzapine 5 mg daily for now, will discontinue if hyponatremia persists

## 2017-03-30 NOTE — Assessment & Plan Note (Addendum)
Chronic. No signs of acute flare. Pain likely suboptimal control of RA with DMARD's. --Continue methotrexate and sulfasalazine as prescribed with intentions of following up with the rheumatologist on 9/11 for further management --Given Mobic 15 mg daily as needed for pain is temporary means with intentions of discontinuing --Patient to schedule screening colonoscopy, form given

## 2017-03-30 NOTE — Patient Instructions (Signed)
Thank you for coming in to see [place] today. Please see below to review our plan for today's visit.  1. I will call you if your blood tests return abnormal, otherwise expect received results the mail. 2. I am pleased to hear your chest pain has resolved. Follow-up with Dr. Tenny Craw on 9/7 at 3 PM. 3. I have given you a medication called Mobic for your right wrist pain. This medication is temporary and is intended to continue through the week until you follow-up with your rheumatologist. I believe we ultimately need this a change to your chronic rheumatoid medications. This will be done by the rheumatologist and will hopefully help better control your chronic pain.  Please call the clinic at [phone] if your symptoms worsen or you have any concerns. It was my pleasure to see you. -- Durward Parcel, DO [facility], PGY-2

## 2017-03-31 ENCOUNTER — Other Ambulatory Visit: Payer: Self-pay | Admitting: Family Medicine

## 2017-03-31 ENCOUNTER — Telehealth: Payer: Self-pay | Admitting: Family Medicine

## 2017-03-31 DIAGNOSIS — E871 Hypo-osmolality and hyponatremia: Secondary | ICD-10-CM

## 2017-03-31 LAB — CBC WITH DIFFERENTIAL/PLATELET
BASOS ABS: 0 10*3/uL (ref 0.0–0.2)
Basos: 0 %
EOS (ABSOLUTE): 0.1 10*3/uL (ref 0.0–0.4)
Eos: 2 %
Hematocrit: 27.3 % — ABNORMAL LOW (ref 34.0–46.6)
Hemoglobin: 9.2 g/dL — ABNORMAL LOW (ref 11.1–15.9)
IMMATURE GRANS (ABS): 0 10*3/uL (ref 0.0–0.1)
Immature Granulocytes: 0 %
LYMPHS ABS: 0.4 10*3/uL — AB (ref 0.7–3.1)
LYMPHS: 11 %
MCH: 31.7 pg (ref 26.6–33.0)
MCHC: 33.7 g/dL (ref 31.5–35.7)
MCV: 94 fL (ref 79–97)
Monocytes Absolute: 0.3 10*3/uL (ref 0.1–0.9)
Monocytes: 7 %
NEUTROS ABS: 2.9 10*3/uL (ref 1.4–7.0)
Neutrophils: 80 %
PLATELETS: 297 10*3/uL (ref 150–379)
RBC: 2.9 x10E6/uL — ABNORMAL LOW (ref 3.77–5.28)
RDW: 16.3 % — AB (ref 12.3–15.4)
WBC: 3.7 10*3/uL (ref 3.4–10.8)

## 2017-03-31 LAB — CMP14+EGFR
ALT: 24 IU/L (ref 0–32)
AST: 44 IU/L — AB (ref 0–40)
Albumin/Globulin Ratio: 1.1 — ABNORMAL LOW (ref 1.2–2.2)
Albumin: 3.9 g/dL (ref 3.6–4.8)
Alkaline Phosphatase: 151 IU/L — ABNORMAL HIGH (ref 39–117)
BUN/Creatinine Ratio: 8 — ABNORMAL LOW (ref 12–28)
BUN: 6 mg/dL — AB (ref 8–27)
Bilirubin Total: 0.4 mg/dL (ref 0.0–1.2)
CO2: 24 mmol/L (ref 20–29)
CREATININE: 0.78 mg/dL (ref 0.57–1.00)
Calcium: 8.7 mg/dL (ref 8.7–10.3)
Chloride: 86 mmol/L — ABNORMAL LOW (ref 96–106)
GFR calc Af Amer: 94 mL/min/{1.73_m2} (ref >59)
GFR, EST NON AFRICAN AMERICAN: 82 mL/min/{1.73_m2} (ref >59)
GLUCOSE: 99 mg/dL (ref 65–99)
Globulin, Total: 3.7 g/dL (ref 1.5–4.5)
Potassium: 4 mmol/L (ref 3.5–5.2)
SODIUM: 128 mmol/L — AB (ref 134–144)
Total Protein: 7.6 g/dL (ref 6.0–8.5)

## 2017-03-31 NOTE — Telephone Encounter (Signed)
Contacted patient regarding persistent hyponatremia. Recommending discontinuing Zyprexa at this time due to possible side effect, especially in the geriatric population. Patient understanding and will come for BMET in approximately 2 weeks for recheck. -- Durward Parcel, DO [facility], PGY-2

## 2017-04-02 ENCOUNTER — Ambulatory Visit (INDEPENDENT_AMBULATORY_CARE_PROVIDER_SITE_OTHER): Payer: Medicaid Other | Admitting: Internal Medicine

## 2017-04-02 ENCOUNTER — Encounter: Payer: Self-pay | Admitting: Internal Medicine

## 2017-04-02 VITALS — BP 112/70 | HR 70 | Ht 66.0 in | Wt 130.0 lb

## 2017-04-02 DIAGNOSIS — E785 Hyperlipidemia, unspecified: Secondary | ICD-10-CM | POA: Diagnosis not present

## 2017-04-02 DIAGNOSIS — M349 Systemic sclerosis, unspecified: Secondary | ICD-10-CM | POA: Diagnosis not present

## 2017-04-02 DIAGNOSIS — I10 Essential (primary) hypertension: Secondary | ICD-10-CM | POA: Diagnosis not present

## 2017-04-02 DIAGNOSIS — I2510 Atherosclerotic heart disease of native coronary artery without angina pectoris: Secondary | ICD-10-CM | POA: Diagnosis not present

## 2017-04-02 NOTE — Patient Instructions (Signed)
Your physician recommends that you continue on your current medications as directed. Please refer to the Current Medication list given to you today.  Your physician recommends that you return for lab work when you go to your PCP office for other blood work.   Your physician recommends that you schedule a follow-up appointment in: December, 2018 with Dr. Tenny Craw.

## 2017-04-13 ENCOUNTER — Telehealth: Payer: Self-pay | Admitting: *Deleted

## 2017-04-13 ENCOUNTER — Telehealth: Payer: Self-pay | Admitting: Family Medicine

## 2017-04-13 NOTE — Telephone Encounter (Signed)
Patient daughter Jocelyn Shelton called stating patient need a refill on prednisone to help with weight gain and increase appetite. Patient has lost wt and not eating that well.  Please give her a call [phone]. Clovis Pu, RN

## 2017-04-13 NOTE — Telephone Encounter (Signed)
Contacted patient's daughter concerning patient's decrease in appetite and weight loss. Seems to be going from 145 to 130 over a 2 year period. Patient has also been more fatigued over the past couple of months. She recently saw her new rheumatologist one week ago who has ordered a workup for her heart and lungs given her RA/scleroderma. Recommended to daughter for patient to continue supplementing with ensure for meals as needed. Discussed red flags and reasons for her to return to [facility] for further workup. Patient started without further questions or concerns. -- Durward Parcel, DO [facility], PGY-2

## 2017-04-19 ENCOUNTER — Other Ambulatory Visit: Payer: Self-pay | Admitting: Family Medicine

## 2017-04-19 DIAGNOSIS — M069 Rheumatoid arthritis, unspecified: Secondary | ICD-10-CM

## 2017-04-19 DIAGNOSIS — K219 Gastro-esophageal reflux disease without esophagitis: Secondary | ICD-10-CM

## 2017-05-01 ENCOUNTER — Other Ambulatory Visit: Payer: Self-pay | Admitting: Family Medicine

## 2017-05-01 DIAGNOSIS — M069 Rheumatoid arthritis, unspecified: Secondary | ICD-10-CM

## 2017-05-26 ENCOUNTER — Other Ambulatory Visit: Payer: Self-pay | Admitting: Family Medicine

## 2017-05-26 DIAGNOSIS — R05 Cough: Secondary | ICD-10-CM

## 2017-05-26 DIAGNOSIS — K219 Gastro-esophageal reflux disease without esophagitis: Secondary | ICD-10-CM

## 2017-05-26 DIAGNOSIS — M069 Rheumatoid arthritis, unspecified: Secondary | ICD-10-CM

## 2017-05-26 DIAGNOSIS — J449 Chronic obstructive pulmonary disease, unspecified: Secondary | ICD-10-CM

## 2017-05-26 DIAGNOSIS — R058 Other specified cough: Secondary | ICD-10-CM

## 2017-05-26 MED ORDER — FAMOTIDINE 20 MG PO TABS: 20.0000 mg | ORAL_TABLET | Freq: Every day | ORAL | 0 refills | Status: DC

## 2017-05-26 MED ORDER — ALBUTEROL SULFATE HFA 108 (90 BASE) MCG/ACT IN AERS: INHALATION_SPRAY | RESPIRATORY_TRACT | 1 refills | Status: DC

## 2017-05-26 MED ORDER — SULFASALAZINE 500 MG PO TABS: 1000.0000 mg | ORAL_TABLET | Freq: Two times a day (BID) | ORAL | 0 refills | Status: DC

## 2017-06-16 ENCOUNTER — Emergency Department (HOSPITAL_COMMUNITY): Payer: Medicaid Other

## 2017-06-16 ENCOUNTER — Observation Stay (HOSPITAL_COMMUNITY)
Admission: EM | Admit: 2017-06-16 | Discharge: 2017-06-17 | Disposition: A | Discharge status: Home / Self Care | Payer: Medicaid Other | Attending: Family Medicine | Admitting: Family Medicine

## 2017-06-16 ENCOUNTER — Other Ambulatory Visit: Payer: Self-pay

## 2017-06-16 ENCOUNTER — Encounter (HOSPITAL_COMMUNITY): Payer: Self-pay | Admitting: Emergency Medicine

## 2017-06-16 DIAGNOSIS — Z7982 Long term (current) use of aspirin: Secondary | ICD-10-CM | POA: Diagnosis not present

## 2017-06-16 DIAGNOSIS — I272 Pulmonary hypertension, unspecified: Secondary | ICD-10-CM | POA: Diagnosis present

## 2017-06-16 DIAGNOSIS — E7800 Pure hypercholesterolemia, unspecified: Secondary | ICD-10-CM | POA: Diagnosis not present

## 2017-06-16 DIAGNOSIS — Z955 Presence of coronary angioplasty implant and graft: Secondary | ICD-10-CM | POA: Diagnosis not present

## 2017-06-16 DIAGNOSIS — J449 Chronic obstructive pulmonary disease, unspecified: Secondary | ICD-10-CM | POA: Diagnosis present

## 2017-06-16 DIAGNOSIS — M349 Systemic sclerosis, unspecified: Secondary | ICD-10-CM | POA: Diagnosis not present

## 2017-06-16 DIAGNOSIS — I119 Hypertensive heart disease without heart failure: Secondary | ICD-10-CM | POA: Insufficient documentation

## 2017-06-16 DIAGNOSIS — M069 Rheumatoid arthritis, unspecified: Secondary | ICD-10-CM | POA: Diagnosis not present

## 2017-06-16 DIAGNOSIS — Z7902 Long term (current) use of antithrombotics/antiplatelets: Secondary | ICD-10-CM | POA: Insufficient documentation

## 2017-06-16 DIAGNOSIS — R55 Syncope and collapse: Secondary | ICD-10-CM | POA: Insufficient documentation

## 2017-06-16 DIAGNOSIS — I252 Old myocardial infarction: Secondary | ICD-10-CM | POA: Insufficient documentation

## 2017-06-16 DIAGNOSIS — F17210 Nicotine dependence, cigarettes, uncomplicated: Secondary | ICD-10-CM | POA: Insufficient documentation

## 2017-06-16 DIAGNOSIS — K228 Other specified diseases of esophagus: Secondary | ICD-10-CM

## 2017-06-16 DIAGNOSIS — I2510 Atherosclerotic heart disease of native coronary artery without angina pectoris: Secondary | ICD-10-CM | POA: Diagnosis not present

## 2017-06-16 DIAGNOSIS — K219 Gastro-esophageal reflux disease without esophagitis: Secondary | ICD-10-CM | POA: Diagnosis not present

## 2017-06-16 DIAGNOSIS — R079 Chest pain, unspecified: Principal | ICD-10-CM | POA: Insufficient documentation

## 2017-06-16 DIAGNOSIS — K2289 Other specified disease of esophagus: Secondary | ICD-10-CM

## 2017-06-16 DIAGNOSIS — I700 Atherosclerosis of aorta: Secondary | ICD-10-CM | POA: Insufficient documentation

## 2017-06-16 DIAGNOSIS — Z86711 Personal history of pulmonary embolism: Secondary | ICD-10-CM | POA: Diagnosis not present

## 2017-06-16 DIAGNOSIS — I10 Essential (primary) hypertension: Secondary | ICD-10-CM

## 2017-06-16 DIAGNOSIS — M79606 Pain in leg, unspecified: Secondary | ICD-10-CM | POA: Diagnosis present

## 2017-06-16 DIAGNOSIS — D61818 Other pancytopenia: Secondary | ICD-10-CM | POA: Diagnosis not present

## 2017-06-16 DIAGNOSIS — Z23 Encounter for immunization: Secondary | ICD-10-CM | POA: Insufficient documentation

## 2017-06-16 DIAGNOSIS — Z79899 Other long term (current) drug therapy: Secondary | ICD-10-CM | POA: Insufficient documentation

## 2017-06-16 DIAGNOSIS — J8410 Pulmonary fibrosis, unspecified: Secondary | ICD-10-CM | POA: Insufficient documentation

## 2017-06-16 DIAGNOSIS — I7300 Raynaud's syndrome without gangrene: Secondary | ICD-10-CM | POA: Insufficient documentation

## 2017-06-16 DIAGNOSIS — E46 Unspecified protein-calorie malnutrition: Secondary | ICD-10-CM | POA: Diagnosis present

## 2017-06-16 DIAGNOSIS — J479 Bronchiectasis, uncomplicated: Secondary | ICD-10-CM

## 2017-06-16 DIAGNOSIS — D849 Immunodeficiency, unspecified: Secondary | ICD-10-CM | POA: Diagnosis present

## 2017-06-16 DIAGNOSIS — R531 Weakness: Secondary | ICD-10-CM

## 2017-06-16 DIAGNOSIS — D899 Disorder involving the immune mechanism, unspecified: Secondary | ICD-10-CM

## 2017-06-16 DIAGNOSIS — F17200 Nicotine dependence, unspecified, uncomplicated: Secondary | ICD-10-CM | POA: Diagnosis present

## 2017-06-16 DIAGNOSIS — E871 Hypo-osmolality and hyponatremia: Secondary | ICD-10-CM | POA: Diagnosis not present

## 2017-06-16 HISTORY — DX: Idiopathic aseptic necrosis of left humerus: M87.022

## 2017-06-16 HISTORY — DX: Acute myocardial infarction, unspecified: I21.9

## 2017-06-16 HISTORY — DX: Bronchiectasis, uncomplicated: J47.9

## 2017-06-16 HISTORY — DX: Rheumatoid arthritis, unspecified: M06.9

## 2017-06-16 HISTORY — DX: Pure hypercholesterolemia, unspecified: E78.00

## 2017-06-16 LAB — CBC WITH DIFFERENTIAL/PLATELET
Basophils Absolute: 0 10*3/uL (ref 0.0–0.1)
Basophils Relative: 0 %
Eosinophils Absolute: 0.1 10*3/uL (ref 0.0–0.7)
Eosinophils Relative: 3 %
HEMATOCRIT: 28.9 % — AB (ref 36.0–46.0)
HEMOGLOBIN: 9.5 g/dL — AB (ref 12.0–15.0)
LYMPHS ABS: 0.4 10*3/uL — AB (ref 0.7–4.0)
Lymphocytes Relative: 12 %
MCH: 33.3 pg (ref 26.0–34.0)
MCHC: 32.9 g/dL (ref 30.0–36.0)
MCV: 101.4 fL — ABNORMAL HIGH (ref 78.0–100.0)
MONOS PCT: 6 %
Monocytes Absolute: 0.2 10*3/uL (ref 0.1–1.0)
NEUTROS ABS: 2.7 10*3/uL (ref 1.7–7.7)
NEUTROS PCT: 79 %
Platelets: 230 10*3/uL (ref 150–400)
RBC: 2.85 MIL/uL — ABNORMAL LOW (ref 3.87–5.11)
RDW: 17.5 % — ABNORMAL HIGH (ref 11.5–15.5)
WBC: 3.3 10*3/uL — ABNORMAL LOW (ref 4.0–10.5)

## 2017-06-16 LAB — D-DIMER, QUANTITATIVE (NOT AT ARMC): D DIMER QUANT: 2.84 ug{FEU}/mL — AB (ref 0.00–0.50)

## 2017-06-16 LAB — COMPREHENSIVE METABOLIC PANEL
ALK PHOS: 149 U/L — AB (ref 38–126)
ALT: 26 U/L (ref 14–54)
ANION GAP: 8 (ref 5–15)
AST: 48 U/L — ABNORMAL HIGH (ref 15–41)
Albumin: 3 g/dL — ABNORMAL LOW (ref 3.5–5.0)
BUN: 7 mg/dL (ref 6–20)
CO2: 26 mmol/L (ref 22–32)
CREATININE: 0.78 mg/dL (ref 0.44–1.00)
Calcium: 8.3 mg/dL — ABNORMAL LOW (ref 8.9–10.3)
Chloride: 99 mmol/L — ABNORMAL LOW (ref 101–111)
GFR calc non Af Amer: >60 mL/min (ref >60)
GLUCOSE: 106 mg/dL — AB (ref 65–99)
Potassium: 3.2 mmol/L — ABNORMAL LOW (ref 3.5–5.1)
Sodium: 133 mmol/L — ABNORMAL LOW (ref 135–145)
TOTAL PROTEIN: 6.9 g/dL (ref 6.5–8.1)
Total Bilirubin: 0.4 mg/dL (ref 0.3–1.2)

## 2017-06-16 LAB — I-STAT TROPONIN, ED: TROPONIN I, POC: 0.54 ng/mL — AB (ref 0.00–0.08)

## 2017-06-16 LAB — CBG MONITORING, ED: Glucose-Capillary: 105 mg/dL — ABNORMAL HIGH (ref 65–99)

## 2017-06-16 MED ORDER — FENTANYL CITRATE (PF) 100 MCG/2ML IJ SOLN
50.0000 ug | Freq: Once | INTRAMUSCULAR | Status: AC
  Administered 2017-06-16: 50 ug via INTRAVENOUS
  Filled 2017-06-16: qty 2

## 2017-06-16 MED ORDER — ENSURE ENLIVE PO LIQD
237.0000 mL | Freq: Two times a day (BID) | ORAL | Status: DC
  Administered 2017-06-17: 237 mL via ORAL

## 2017-06-16 MED ORDER — IPRATROPIUM-ALBUTEROL 0.5-2.5 (3) MG/3ML IN SOLN
3.0000 mL | Freq: Once | RESPIRATORY_TRACT | Status: AC
  Administered 2017-06-16: 3 mL via RESPIRATORY_TRACT
  Filled 2017-06-16: qty 3

## 2017-06-16 MED ORDER — IOPAMIDOL (ISOVUE-370) INJECTION 76%
INTRAVENOUS | Status: AC
  Administered 2017-06-16: 100 mL
  Filled 2017-06-16: qty 100

## 2017-06-16 MED ORDER — INFLUENZA VAC SPLIT QUAD 0.5 ML IM SUSY
0.5000 mL | PREFILLED_SYRINGE | INTRAMUSCULAR | Status: AC
  Administered 2017-06-17: 0.5 mL via INTRAMUSCULAR
  Filled 2017-06-16: qty 0.5

## 2017-06-16 NOTE — ED Notes (Signed)
Lab results reported to Nurse Vergia Alcon.

## 2017-06-16 NOTE — H&P (Signed)
Family Medicine Teaching [facility] Admission History and Physical Service Pager: [phone]  Patient name: Jocelyn Shelton Medical record number: [id number] Date of birth: 1956/03/12 Age: 61 y.o. Gender: female  Primary Care Provider: Wendee Beavers, DO Consultants: Cardiology Code Status: full  Chief Complaint: weakness  Assessment and Plan: Jocelyn Shelton is a 61 y.o. female presenting with near syncope with prodrome and chest pain with elevated troponin. PMH is significant for scleroderma, rheumatoid arthritis, COPD, CAD with MI October 2016 s/p 2 stents  Near syncope and chronic chest pain Patient reports prodrome of feeling flushed followed by sudden weakness requiring her to fall on the floor.  Fortunately she was caught by her daughter.  ED note reports loss of consciousness of 30 seconds, however patient denies this.  She denies any new chest pain, palpitations, shortness of breath, aura-like sensations aside from feeling flushed just prior to the weakness.  Patient had no loss of bowel or bladder control and did not bite her tongue.  She has no personal history of seizure like activity, but does have a younger sister and granddaughter with history of seizures.  She has no history of sudden cardiac death.  Does have a history of CAD with stent placement in 2016.  Reports chronic diffuse chest pain that is reproducible to palpation throughout. Not worse with exertion and not improved with nitroglycerine. Elevated troponin to 0.54 and EKG showing normal sinus rhythm and chest x-ray demonstrating known progressive interstitial lung disease.  Patient does have chronic cough, secondary to her known pulmonary fibrosis and interstitial lung disease and reports that she has rib pain throughout with coughing.  There was concern for pulmonary embolism and d-dimer was shown to be elevated and CTA showed no PE.  The patient had cardiac workup for chest pain in August 2018 and echocardiogram was  ordered but patient was discharged prior to having this done.  She has been following up with Dietrich Pates who has her on dual any platelet therapy.  Currently vital signs are stable and patient denies any chest pain or shortness of breath at rest.  Patient is chronically deconditioned and immunosuppressed secondary to her scleroderma and rheumatoid arthritis.  -Admit with telemetry to FMTS under attending Mcdiarmid -Trend troponins every 6 hours -AM EKG -Continuous pulse ox -Cardiology consult -Follow-up echocardiogram -f/u A1C, repeat lipid panel -Orthostatic vital signs -PT OT consult  Hypertension Stable -cont HCTZ 25mg  daily -lopressor 50mg  BID  Pulmonary fibrosis and ILD Breathing comfortably on room air and satting appropriately.  -cont albuterol PRN -atrovent PRN  CAD history of stent placement Cont dual antiplatelet  Rheumatoid arthritis Reports no current flare.  Takes methotrexate and sulfasalazine.  Follows up with rheumatologist -Continue methotrexate 2.5 mg twice daily on Mondays and Tuesdays -Continue sulfasalazine 500 mg twice daily  Tobacco use Currently smokes 6 cigarettes daily -Encourage smoking cessation  GERD Stable -Famotidine 20 mg daily  Chronically deconditioned and malnutrition Malnutrition with albumin to 2.7 and appears weak and deconditioned.  Reports decreased appetite for several months that is recently improved.  -PT OT consult; hopefully SNF placement -Nutrition consult   FEN/GI: Regular diet Prophylaxis: Plavix  Disposition: Admit to telemetry  History of Present Illness:  Jocelyn Shelton is a 61 y.o. female presenting with near syncopal episode and chronic chest pain.  Ms Strathman is a 61yo F who is presenting after walking from her kitchen when suddenly it felt like "heat" hit her the face and she felt weak and went down.  She did not fall because he daughter was able to help her to the ground and put a pillow under her head.  She  denied any chest pain at that time, shortness of breath or heart racing. She does report overall decreased appetite for the past several months, but this has improved a bit lately. She denies any history of seizure-like activity. She thinks that her younger sister and her granddaughter have seizures.  Continues to complain about pain in her ribs when she coughs. This is a chronic problem for her. Does report a little bit of increased swelling in her legs. Current smoker of 6-7 cigarettes daily.   When she presented to the ED she had elevated troponin to 0.54 and this was discussed with Cardiology who recommend trending troponins and overnight observation. ED also ordered d-dimer, which was elevated and CTA chest, which did not reveal any PE.   Review Of Systems: Per HPI   ROS  Patient Active Problem List   Diagnosis Date Noted  . Generalized weakness   . Coronary artery disease involving native coronary artery of native heart without angina pectoris   . Hyponatremia 03/16/2017  . Claudication in peripheral vascular disease (HCC) 10/28/2016  . Foot ulcer, right (HCC)   . Scleroderma (HCC) 03/08/2016  . Immunosuppressed status (HCC)   . COPD (chronic obstructive pulmonary disease) (HCC) 08/30/2015  . NSTEMI (non-ST elevated myocardial infarction) (HCC)   . Allergic rhinitis 12/03/2014  . Preventative health care 03/26/2014  . Rheumatoid arthritis (HCC) 03/14/2013  . Other pancytopenia (HCC) 03/01/2013  . 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 Erythema nodosum 04/27/2011  . GERD (gastroesophageal reflux disease) 08/02/2008  . Major depressive disorder, recurrent episode (HCC) 09/23/2006  . Tobacco use disorder 09/23/2006  . Primary hypertension 09/23/2006  . Raynauds syndrome 09/23/2006    Past Medical History: Past Medical History:  Diagnosis Date  . CAD in native artery 05/23/2015  . Hypertension   . Leg pain    ABIs 1/18: R 1.2, L 1.1  . Reflux   . Rheumatoid  arthritis(714.0)   . S/P angioplasty with stent, 05/22/15 pRCA DES and pLCX DES 05/23/2015  . Scleroderma [facility])     Past Surgical History: Past Surgical History:  Procedure Laterality Date  . APPENDECTOMY  90's  . CARDIAC CATHETERIZATION N/A 05/22/2015   Procedure: Left Heart Cath and Coronary Angiography;  Surgeon: Lennette Bihari, MD;  Location: [facility] INVASIVE CV LAB;  Service: Cardiovascular;  Laterality: N/A;  . CARDIAC CATHETERIZATION N/A 05/22/2015   Procedure: Coronary Stent Intervention;  Surgeon: Lennette Bihari, MD;  Location: [facility] INVASIVE CV LAB;  Service: Cardiovascular;  Laterality: N/A;    Social History: Social History   Tobacco Use  . Smoking status: Current Every Day Smoker    Packs/day: 0.25    Years: 40.00    Pack years: 10.00    Types: Cigarettes  . Smokeless tobacco: Never Used  Substance Use Topics  . Alcohol use: Yes    Alcohol/week: 2.4 oz    Types: 4 Standard drinks or equivalent per week  . Drug use: No    Family History: Family History  Problem Relation Age of Onset  . Hypertension Father   . Heart attack Mother   . Diabetes Daughter   . Diabetes Sister   . Lung cancer Brother 50    Allergies and Medications: Allergies  Allergen Reactions  . Ticagrelor Shortness Of Breath  . Cymbalta [Duloxetine Hcl] Diarrhea and Nausea Only  No appetite, stomach pain  . Olanzapine Other (See Comments)    Interfered with Blood pressure medications  . Aspirin Other (See Comments)    Stomach cramps  . Lisinopril Cough  . Tomato Rash   No current facility-administered medications on file prior to encounter.    Current Outpatient Medications on File Prior to Encounter  Medication Sig Dispense Refill  . atorvastatin (LIPITOR) 40 MG tablet Take 1 tablet (40 mg total) by mouth daily at 6 PM. 90 tablet 3  . ATROVENT HFA 17 MCG/ACT inhaler INHALE TWO PUFFS BY MOUTH ONCE DAILY AS NEEDED FOR WHEEZING 1 Inhaler 3  . clopidogrel (PLAVIX) 75 MG tablet Take 1  tablet (75 mg total) by mouth daily. DO NOT START UNTIL YOU FINISH YOUR EFFIENT 90 tablet 3  . DIOVAN 80 MG tablet Take 1 tablet (80 mg total) by mouth daily. 30 tablet 11  . folic acid (FOLVITE) 1 MG tablet TAKE 1 TABLET (1 MG TOTAL) BY MOUTH DAILY. 30 tablet 6  . hydrochlorothiazide (HYDRODIURIL) 25 MG tablet Take 1 tablet (25 mg total) by mouth daily. 90 tablet 3  . meloxicam (MOBIC) 15 MG tablet TAKE 1 TABLET BY MOUTH EVERY DAY 30 tablet 0  . metoprolol (LOPRESSOR) 50 MG tablet Take 1 tablet (50 mg total) by mouth 2 (two) times daily. 180 tablet 3  . acetaminophen (TYLENOL) 500 MG tablet Take 1,000 mg by mouth daily as needed for pain.     Marland Kitchen albuterol (PROVENTIL HFA) 108 (90 Base) MCG/ACT inhaler INHALE TWO PUFFS BY MOUTH EVERY 4 HOURS AS NEEDED FOR WHEEZING FOR COUGH FOR SHORTNESS OF BREATH 7 each 1  . aspirin EC 81 MG EC tablet Take 1 tablet (81 mg total) by mouth daily.    . Black Cohosh 40 MG CAPS Take 1 capsule by mouth 2 (two) times daily.    . Calcium Carb-Cholecalciferol (CALCIUM 600+D3 PO) Take 1 tablet by mouth daily.    . dorzolamide-timolol (COSOPT) 22.3-6.8 MG/ML ophthalmic solution Place 1 drop into both eyes 2 (two) times daily.    . famotidine (PEPCID) 20 MG tablet Take 1 tablet (20 mg total) by mouth at bedtime. 30 tablet 0  . fluticasone (FLONASE) 50 MCG/ACT nasal spray USE TWO SPRAY(S) IN EACH NOSTRIL DAILY AS NEEDED FOR  RHINITIS 16 g 2  . methotrexate (RHEUMATREX) 2.5 MG tablet TAKE 4 TABLETS (10MG ) BY MOUTH 2 TIMES A WEEK EVERY MONDAY AND TUESDAY 32 tablet 0  . Multiple Vitamin (MULTIVITAMIN WITH MINERALS) TABS tablet Take 1 tablet by mouth daily.    . nitroGLYCERIN (NITROSTAT) 0.4 MG SL tablet Place 0.4 mg under the tongue every 5 (five) minutes as needed for chest pain.    Marland Kitchen omeprazole (PRILOSEC) 40 MG capsule TAKE ONE CAPSULE BY MOUTH  DAILY 90 capsule 1  . sulfaSALAzine (AZULFIDINE) 500 MG tablet Take 2 tablets (1,000 mg total) by mouth 2 (two) times daily. 120 tablet  0    Objective: BP 129/77   Pulse 83   Resp 16   Ht 5\' 6"  (1.676 m)   Wt 125 lb (56.7 kg)   SpO2 100%   BMI 20.18 kg/m  Exam: General: 61 year old female lying in bed chronically deconditioned but in no acute distress Eyes: Extraocular muscles intact, pupils equal round reactive to light bilaterally ENTM: No nasal drainage, tacky mucous membranes, clear oropharynx Neck: Supple no midline defect no lymphadenopathy Cardiovascular: Regular rate and rhythm, no apparent murmurs or edema Respiratory: Normal work of breathing, clear to consultation  bilaterally, no wheezing or rhonchi, diffuse crackles throughout Gastrointestinal: Soft, nontender or distended no organomegaly MSK: No gross deformities or edema Derm: Warm and dry, no new rashes Extremities: Lower extremity edema Neuro: CN II through XII within normal limits, reports significant pain in lower extremities with light touch, strength 5 out of 5 upper and lower extremities Psych: Normal mood and affect  Labs and Imaging: CBC BMET  Recent Labs  Lab 06/16/17 1629  WBC 3.3*  HGB 9.5*  HCT 28.9*  PLT 230   Recent Labs  Lab 06/16/17 1629  NA 133*  K 3.2*  CL 99*  CO2 26  BUN 7  CREATININE 0.78  GLUCOSE 106*  CALCIUM 8.3Renne Musca, MD 06/16/2017, 8:59 PM PGY-2, [facility] FPTS Intern pager: [phone], text pages welcome

## 2017-06-16 NOTE — ED Triage Notes (Signed)
Per EMS:  Pt from home with a syncopal episode.  Pt was coming from the kitchen and felt light headed.  Family noticed pt was about to fall and lowered pt to ground. Pt had a 30-45 second LOC along with bilateral weakness.   Pt denies and N/V/D.  Pt c/o of a cough that is causing her rib pain x2 months.  PTA vitals: BP 130/80, HR 77, Sp02 92% on RA, CBG 176.

## 2017-06-16 NOTE — ED Notes (Addendum)
Pt CBG 105 RN Baird Lyons notified

## 2017-06-17 ENCOUNTER — Encounter (HOSPITAL_COMMUNITY): Payer: Self-pay | Admitting: Family Medicine

## 2017-06-17 ENCOUNTER — Other Ambulatory Visit: Payer: Self-pay

## 2017-06-17 ENCOUNTER — Inpatient Hospital Stay (HOSPITAL_COMMUNITY): Payer: Medicaid Other

## 2017-06-17 DIAGNOSIS — M87021 Idiopathic aseptic necrosis of right humerus: Secondary | ICD-10-CM | POA: Insufficient documentation

## 2017-06-17 DIAGNOSIS — M87022 Idiopathic aseptic necrosis of left humerus: Secondary | ICD-10-CM

## 2017-06-17 DIAGNOSIS — I272 Pulmonary hypertension, unspecified: Secondary | ICD-10-CM | POA: Diagnosis present

## 2017-06-17 DIAGNOSIS — E46 Unspecified protein-calorie malnutrition: Secondary | ICD-10-CM | POA: Diagnosis present

## 2017-06-17 DIAGNOSIS — R079 Chest pain, unspecified: Secondary | ICD-10-CM | POA: Diagnosis not present

## 2017-06-17 DIAGNOSIS — R071 Chest pain on breathing: Secondary | ICD-10-CM | POA: Diagnosis not present

## 2017-06-17 DIAGNOSIS — J479 Bronchiectasis, uncomplicated: Secondary | ICD-10-CM

## 2017-06-17 DIAGNOSIS — Z23 Encounter for immunization: Secondary | ICD-10-CM | POA: Diagnosis not present

## 2017-06-17 DIAGNOSIS — I361 Nonrheumatic tricuspid (valve) insufficiency: Secondary | ICD-10-CM

## 2017-06-17 DIAGNOSIS — K2289 Other specified disease of esophagus: Secondary | ICD-10-CM

## 2017-06-17 DIAGNOSIS — R55 Syncope and collapse: Secondary | ICD-10-CM | POA: Diagnosis not present

## 2017-06-17 DIAGNOSIS — K228 Other specified diseases of esophagus: Secondary | ICD-10-CM

## 2017-06-17 DIAGNOSIS — I119 Hypertensive heart disease without heart failure: Secondary | ICD-10-CM | POA: Diagnosis not present

## 2017-06-17 DIAGNOSIS — M79606 Pain in leg, unspecified: Secondary | ICD-10-CM | POA: Diagnosis present

## 2017-06-17 HISTORY — DX: Idiopathic aseptic necrosis of right humerus: M87.021

## 2017-06-17 HISTORY — DX: Bronchiectasis, uncomplicated: J47.9

## 2017-06-17 HISTORY — DX: Idiopathic aseptic necrosis of left humerus: M87.022

## 2017-06-17 LAB — COMPREHENSIVE METABOLIC PANEL
ALT: 24 U/L (ref 14–54)
AST: 44 U/L — ABNORMAL HIGH (ref 15–41)
Albumin: 2.7 g/dL — ABNORMAL LOW (ref 3.5–5.0)
Alkaline Phosphatase: 122 U/L (ref 38–126)
Anion gap: 8 (ref 5–15)
BUN: 7 mg/dL (ref 6–20)
CO2: 26 mmol/L (ref 22–32)
Calcium: 8.1 mg/dL — ABNORMAL LOW (ref 8.9–10.3)
Chloride: 101 mmol/L (ref 101–111)
Creatinine, Ser: 0.81 mg/dL (ref 0.44–1.00)
Glucose, Bld: 80 mg/dL (ref 65–99)
POTASSIUM: 3.5 mmol/L (ref 3.5–5.1)
Sodium: 135 mmol/L (ref 135–145)
Total Bilirubin: 0.6 mg/dL (ref 0.3–1.2)
Total Protein: 6.4 g/dL — ABNORMAL LOW (ref 6.5–8.1)

## 2017-06-17 LAB — CBC
HEMATOCRIT: 25.7 % — AB (ref 36.0–46.0)
HEMOGLOBIN: 8.4 g/dL — AB (ref 12.0–15.0)
MCH: 32.7 pg (ref 26.0–34.0)
MCHC: 32.7 g/dL (ref 30.0–36.0)
MCV: 100 fL (ref 78.0–100.0)
Platelets: 226 10*3/uL (ref 150–400)
RBC: 2.57 MIL/uL — AB (ref 3.87–5.11)
RDW: 17.4 % — ABNORMAL HIGH (ref 11.5–15.5)
WBC: 2.4 10*3/uL — AB (ref 4.0–10.5)

## 2017-06-17 LAB — TROPONIN I: Troponin I: <0.03 ng/mL (ref <0.03)

## 2017-06-17 LAB — LIPID PANEL
CHOLESTEROL: 84 mg/dL (ref 0–200)
HDL: 33 mg/dL — ABNORMAL LOW (ref >40)
LDL Cholesterol: 43 mg/dL (ref 0–99)
TRIGLYCERIDES: 38 mg/dL (ref <150)
Total CHOL/HDL Ratio: 2.5 RATIO
VLDL: 8 mg/dL (ref 0–40)

## 2017-06-17 LAB — HEMOGLOBIN A1C
HEMOGLOBIN A1C: 4.8 % (ref 4.8–5.6)
Mean Plasma Glucose: 91.06 mg/dL

## 2017-06-17 LAB — ECHOCARDIOGRAM COMPLETE
Height: 66 in
Weight: 1955.2 oz

## 2017-06-17 MED ORDER — HYDROCHLOROTHIAZIDE 25 MG PO TABS: 12.5000 mg | ORAL_TABLET | Freq: Every day | ORAL | Status: DC

## 2017-06-17 MED ORDER — ENSURE ENLIVE PO LIQD: 237.0000 mL | Freq: Two times a day (BID) | ORAL | 12 refills | Status: DC

## 2017-06-17 MED ORDER — HYDROCHLOROTHIAZIDE 25 MG PO TABS
25.0000 mg | ORAL_TABLET | Freq: Every day | ORAL | Status: DC
Start: 2017-06-17 — End: 2017-06-17
  Administered 2017-06-17: 25 mg via ORAL
  Filled 2017-06-17: qty 1

## 2017-06-17 MED ORDER — ASPIRIN EC 81 MG PO TBEC
81.0000 mg | DELAYED_RELEASE_TABLET | Freq: Every day | ORAL | Status: DC
  Administered 2017-06-17: 81 mg via ORAL
  Filled 2017-06-17: qty 1

## 2017-06-17 MED ORDER — MELOXICAM 7.5 MG PO TABS
15.0000 mg | ORAL_TABLET | Freq: Every day | ORAL | Status: DC
  Administered 2017-06-17: 15 mg via ORAL
  Filled 2017-06-17: qty 2

## 2017-06-17 MED ORDER — PANTOPRAZOLE SODIUM 40 MG PO TBEC
40.0000 mg | DELAYED_RELEASE_TABLET | Freq: Every day | ORAL | Status: DC
  Administered 2017-06-17: 40 mg via ORAL
  Filled 2017-06-17: qty 1

## 2017-06-17 MED ORDER — CLOPIDOGREL BISULFATE 75 MG PO TABS
75.0000 mg | ORAL_TABLET | Freq: Every day | ORAL | Status: DC
  Administered 2017-06-17: 75 mg via ORAL
  Filled 2017-06-17: qty 1

## 2017-06-17 MED ORDER — DORZOLAMIDE HCL-TIMOLOL MAL 2-0.5 % OP SOLN
1.0000 [drp] | Freq: Two times a day (BID) | OPHTHALMIC | Status: DC
  Administered 2017-06-17: 1 [drp] via OPHTHALMIC
  Filled 2017-06-17: qty 10

## 2017-06-17 MED ORDER — IPRATROPIUM BROMIDE 0.02 % IN SOLN: 2.5000 mL | Freq: Four times a day (QID) | RESPIRATORY_TRACT | Status: DC | PRN

## 2017-06-17 MED ORDER — FOLIC ACID 1 MG PO TABS
1.0000 mg | ORAL_TABLET | Freq: Every day | ORAL | Status: DC
  Administered 2017-06-17: 1 mg via ORAL
  Filled 2017-06-17: qty 1

## 2017-06-17 MED ORDER — POTASSIUM CHLORIDE CRYS ER 20 MEQ PO TBCR: 40.0000 meq | EXTENDED_RELEASE_TABLET | Freq: Two times a day (BID) | ORAL | Status: DC

## 2017-06-17 MED ORDER — NITROGLYCERIN 0.4 MG SL SUBL: 0.4000 mg | SUBLINGUAL_TABLET | SUBLINGUAL | Status: DC | PRN

## 2017-06-17 MED ORDER — FAMOTIDINE 20 MG PO TABS
20.0000 mg | ORAL_TABLET | Freq: Every day | ORAL | Status: DC
  Administered 2017-06-17: 20 mg via ORAL
  Filled 2017-06-17: qty 1

## 2017-06-17 MED ORDER — IRBESARTAN 75 MG PO TABS
75.0000 mg | ORAL_TABLET | Freq: Every day | ORAL | Status: DC
  Administered 2017-06-17: 75 mg via ORAL
  Filled 2017-06-17: qty 1

## 2017-06-17 MED ORDER — HYDROCHLOROTHIAZIDE 25 MG PO TABS: 12.5000 mg | ORAL_TABLET | Freq: Every day | ORAL | 3 refills | Status: DC

## 2017-06-17 MED ORDER — ALBUTEROL SULFATE (2.5 MG/3ML) 0.083% IN NEBU: 3.0000 mL | INHALATION_SOLUTION | Freq: Four times a day (QID) | RESPIRATORY_TRACT | Status: DC | PRN

## 2017-06-17 MED ORDER — SULFASALAZINE 500 MG PO TABS
1000.0000 mg | ORAL_TABLET | Freq: Two times a day (BID) | ORAL | Status: DC
  Administered 2017-06-17: 1000 mg via ORAL
  Filled 2017-06-17: qty 2

## 2017-06-17 MED ORDER — METOPROLOL TARTRATE 50 MG PO TABS
50.0000 mg | ORAL_TABLET | Freq: Two times a day (BID) | ORAL | Status: DC
  Administered 2017-06-17: 50 mg via ORAL
  Filled 2017-06-17: qty 1

## 2017-06-17 MED ORDER — ATORVASTATIN CALCIUM 40 MG PO TABS: 40.0000 mg | ORAL_TABLET | Freq: Every day | ORAL | Status: DC

## 2017-06-17 NOTE — Progress Notes (Signed)
Discharge instruction was given to pt.  Belongings were sent home with pt.  Adaleigh Warf, RN 

## 2017-06-17 NOTE — Progress Notes (Signed)
Family Medicine Teaching Service Daily Progress Note Intern Pager: [phone]  Patient name: Jocelyn Shelton Medical record number: [id number] Date of birth: 1955/07/29 Age: 61 y.o. Gender: female  Primary Care Provider: Wendee Beavers, DO Consultants: none Code Status: DNI  Pt Overview and Major Events to Date:  1. 7/21 admitted to FMTS  Assessment and Plan:  Near syncope chronic chest pain Despite cardiac history is most likely vasovagal given prodrome and now tachycardia chest pain or shortness of breath prior to reported acute onset weakness.  Vital signs been stable since admission and troponins less than 0.03 x2, EKG unchanged and patient denies chest pain at rest.  Chest pain still reproducible diffusely and worsened with cough.  Reports no sputum production and chest x-ray shows now consolidation making pneumonia unlikely.  Patient will significantly benefit from PT OT consult and hopefully SNF placement.  -Continue cardiac monitoring -Continuous pulse ox -Follow-up echo -Follow hemoglobin A1c -Orthostatic vital signs -PT OT consult  Hypertension Stable -Continue hydrochlorthiazide -Lopressor 50 mg twice daily  Pulmonary fibrosis and interstitial lung disease Breathing company in room air and satting appropriately -Continue albuterol as needed -Atrovent as needed  CAD with history of stent placement Currently on dual antiplatelet therapy and followed by Dr. Huston Foley.  ACS workup negative so far as noted above. -Continue Plavix and aspirin  Rheumatoid arthritis and scleroderma No suggestions of flare at this time.  Follows up with rheumatology. -Continue sulfasalazine 500 mg twice daily -Continue methotrexate 2.5 mg twice daily on Mondays and Tuesdays  Tobacco use Currently smokes 6 cigarettes daily -Encourage smoking cessation  GERD Stable -Famotidine 20 mg daily  Chronically deconditioned and malnutrition Malnutrition with albumin to 2.7 and appears weak  and deconditioned.  Reports decreased appetite for several months that is recently improved.  -PT OT consult; hopefully SNF placement -Nutrition consult   FEN/GI: Regular diet Prophylaxis: Plavix  Disposition: admit to telemetry  Subjective:  Patient reports no chest pain at rest or shortness of breath.   Objective: Temp:  [98.3 F (36.8 C)-99.3 F (37.4 C)] 99.3 F (37.4 C) (11/22 0521) Pulse Rate:  [74-97] 94 (11/22 0521) Resp:  [12-20] 15 (11/22 0521) BP: (115-144)/(48-95) 116/71 (11/22 0521) SpO2:  [97 %-100 %] 100 % (11/22 0521) Weight:  [122 lb 3.2 oz (55.4 kg)-125 lb 0 oz (56.7 kg)] 122 lb 3.2 oz (55.4 kg) (11/22 9629) Physical Exam: General: 61 year old female chronically ill lying in bed appearing comfortable and in no acute distress Cardiovascular: Regular rate and rhythm, no apparent murmurs Respiratory: Normal work of breathing, diffuse crackles, no wheezing Abdomen: Soft, nontender nondistended no organomegaly Extremities: Bilateral lower extremity edema, very tender to palpation with light touch bilateral lower extremities  Laboratory: Recent Labs  Lab 06/16/17 1629 06/17/17 0559  WBC 3.3* 2.4*  HGB 9.5* 8.4*  HCT 28.9* 25.7*  PLT 230 226   Recent Labs  Lab 06/16/17 1629 06/17/17 0559  NA 133* 135  K 3.2* 3.5  CL 99* 101  CO2 26 26  BUN 7 7  CREATININE 0.78 0.81  CALCIUM 8.3* 8.1*  PROT 6.9 6.4*  BILITOT 0.4 0.6  ALKPHOS 149* 122  ALT 26 24  AST 48* 44*  GLUCOSE 106* 80    Imaging/Diagnostic Tests: Dg Chest 2 View  Result Date: 06/16/2017 CLINICAL DATA:  62 year old female with history of cough and intermittent chest pain for the past 2 months. EXAM: CHEST  2 VIEW COMPARISON:  Chest x-ray 04/06/2017. FINDINGS: Mild diffuse interstitial prominence most evident  throughout the mid to lower lungs bilaterally, increasing compared to the prior examination. Lung volumes are normal. No consolidative airspace disease. No pleural effusions. No  pneumothorax. No pulmonary nodule or mass noted. Pulmonary vasculature and the cardiomediastinal silhouette are within normal limits. Aortic atherosclerosis. IMPRESSION: 1. No definite acute cardiopulmonary disease. However, there is evidence of progressive interstitial lung disease. Followup nonemergent high-resolution chest CT is recommended in the near future to better evaluate these findings. 2. Aortic atherosclerosis. Electronically Signed   By: Trudie Reed M.D.   On: 06/16/2017 17:23   Ct Angio Chest Pe W/cm &/or Wo Cm  Result Date: 06/16/2017 CLINICAL DATA:  Cough and rib pain for 2 months. Smoker. Rheumatoid arthritis. EXAM: CT ANGIOGRAPHY CHEST WITH CONTRAST TECHNIQUE: Multidetector CT imaging of the chest was performed using the standard protocol during bolus administration of intravenous contrast. Multiplanar CT image reconstructions and MIPs were obtained to evaluate the vascular anatomy. CONTRAST:  ISOVUE-370 IOPAMIDOL (ISOVUE-370) INJECTION 76% COMPARISON:  Plain films of earlier today.  Chest CT of 03/10/2017. FINDINGS: Cardiovascular: The quality of this exam for evaluation of pulmonary embolism is good-excellent. No evidence of pulmonary embolism. Aortic and branch vessel atherosclerosis. Tortuous thoracic aorta. Mild cardiomegaly, without pericardial effusion. Pulmonary artery enlargement, outflow tract 3.2 cm. Multivessel coronary artery atherosclerosis. Mediastinum/Nodes: Multiple small axillary nodes are decreased compared to the 2017 exam. Upper normal right hilar nodal tissue at 12 mm, similar. Mildly dilated esophagus with fluid level within. Example image 82/series 6. Prevascular node of 9 mm on image 86/series 6 is similar. Lungs/Pleura: Trace bilateral pleural fluid is new since the prior CT. Mild centrilobular emphysema. 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 This is similar to minimally progressive since 03/10/2016. Upper Abdomen:  Normal imaged portions of the liver, spleen, stomach, pancreas, adrenal glands, left kidney. Musculoskeletal: No acute osseous abnormality. Bilateral humeral head avascular necrosis on series 10. Review of the MIP images confirms the above findings. IMPRESSION: 1.  No evidence of pulmonary embolism. 2. Similar to slight increase in bibasilar reticulation and traction bronchiolectasis. Considerations include usual interstitial pneumonitis (which can be seen with collagen vascular disease) or nonspecific interstitial pneumonitis. Consider evaluation with dedicated high-resolution chest CT at 6 months. 3. Trace bilateral pleural effusions, new. 4. Bilateral humeral head avascular necrosis. 5. Esophageal air fluid level suggests dysmotility or gastroesophageal reflux. 6. Pulmonary artery enlargement suggests pulmonary arterial hypertension. Electronically Signed   By: Jeronimo Greaves M.D.   On: 06/16/2017 19:57    Renne Musca, MD 06/17/2017, 10:19 AM PGY-2, [facility] FPTS Intern pager: [phone], text pages welcome

## 2017-06-17 NOTE — Consult Note (Addendum)
Cardiology Consultation:   Patient ID: KAMALI CARMOUCHE; [id number]; Feb 02, 1956   Admit date: 06/16/2017 Date of Consult: 06/17/2017  Primary Care Provider: Wendee Beavers, DO Primary Cardiologist: Dr Tenny Craw    Patient Profile:   Jocelyn Shelton is a 61 y.o. female with a hx of CAD, rheumatoid arthritis, HTN, scleroderma, ILD who is being seen today for the evaluation of chest pain and syncope at the request of Elwanda Brooklyn MD.  History of Present Illness:   Patient's cardiac history dates back to 2016 when she suffered a non-ST elevation myocardial infarction. ardiac catheterization revealed an 80% right coronary artery and an 80% circumflex. Ejection fraction 55%. Patient had PCI of both lesions with drug-eluting stents. Last echocardiogram August 2017 showed normal LV function, grade 1 diastolic dysfunction and mild tricuspid regurgitation. Nuclear study August 2018 showed normal LV function and no ischemia or infarction. Patient has chronic dyspnea on exertion. She does not have orthopnea, PND, pedal edema, exertional chest pain. She was working in the [employer].She developed a flushing sensation and generalized weakness. She went and sat down. She then stood to go to her room and became dizzy with subsequent syncope. She states he was unconscious for 30 seconds. There was no preceding chest pain, dyspnea, nausea, palpitations. No associated weakness in her extremities, incontinence or seizure activities. She has felt well since then. She also describes chest pain only when she coughs. No exertional chest pain. Her pain does not radiate and there are no associated symptoms. Cardiology now asked to evaluate.  Past Medical History:  Diagnosis Date  . Avascular necrosis of humeral head, left (HCC) 06/17/2017  . Bronchiectasis (HCC) 06/17/2017  . CAD in native artery 05/23/2015  . High cholesterol   . Hypertension   . Leg pain    ABIs 1/18: R 1.2, L 1.1  . Myocardial infarction  (HCC) 04/2015  . Reflux   . Rheumatoid arthritis (HCC)    "all over" (06/16/2017)  . Scleroderma [facility])     Past Surgical History:  Procedure Laterality Date  . APPENDECTOMY  90's  . CARDIAC CATHETERIZATION N/A 05/22/2015   Procedure: Left Heart Cath and Coronary Angiography;  Surgeon: Lennette Bihari, MD;  Location: [facility] INVASIVE CV LAB;  Service: Cardiovascular;  Laterality: N/A;  . CARDIAC CATHETERIZATION N/A 05/22/2015   Procedure: Coronary Stent Intervention;  Surgeon: Lennette Bihari, MD;  Location: [facility] INVASIVE CV LAB;  Service: Cardiovascular;  Laterality: N/A;       Inpatient Medications: Scheduled Meds: . aspirin EC  81 mg Oral Daily  . atorvastatin  40 mg Oral q1800  . clopidogrel  75 mg Oral Daily  . dorzolamide-timolol  1 drop Both Eyes BID  . famotidine  20 mg Oral QHS  . feeding supplement (ENSURE ENLIVE)  237 mL Oral BID BM  . folic acid  1 mg Oral Daily  . hydrochlorothiazide  25 mg Oral Daily  . irbesartan  75 mg Oral Daily  . meloxicam  15 mg Oral Daily  . metoprolol tartrate  50 mg Oral BID  . pantoprazole  40 mg Oral Daily  . sulfaSALAzine  1,000 mg Oral BID   Continuous Infusions:  PRN Meds: albuterol, ipratropium, nitroGLYCERIN  Allergies:    Allergies  Allergen Reactions  . Ticagrelor Shortness Of Breath  . Cymbalta [Duloxetine Hcl] Diarrhea and Nausea Only    No appetite, stomach pain  . Olanzapine Other (See Comments)    Interfered with Blood pressure medications  . Aspirin Other (  See Comments)    Stomach cramps  . Lisinopril Cough  . Tomato Rash    Social History:   Social History   Socioeconomic History  . Marital status: Divorced    Spouse name: Not on file  . Number of children: 1  . Years of education: Not on file  . Highest education level: Not on file  Social Needs  . Financial resource strain: Not on file  . Food insecurity - worry: Not on file  . Food insecurity - inability: Not on file  . Transportation needs - medical:  Not on file  . Transportation needs - non-medical: Not on file  Occupational History  . Occupation: Retired  Tobacco Use  . Smoking status: Current Every Day Smoker    Packs/day: 0.33    Years: 46.00    Pack years: 15.18    Types: Cigarettes  . Smokeless tobacco: Never Used  Substance and Sexual Activity  . Alcohol use: Yes    Alcohol/week: 2.4 oz    Types: 4 Standard drinks or equivalent per week  . Drug use: No  . Sexual activity: No  Other Topics Concern  . Not on file  Social History Narrative  . Not on file    Family History:    Family History  Problem Relation Age of Onset  . Hypertension Father   . Heart attack Mother   . Diabetes Daughter   . Diabetes Sister   . Lung cancer Brother 50     ROS:  Please see the history of present illness.  ROS  Arthralgias and weakness; decreased appetite; All other ROS reviewed and negative.     Physical Exam/Data:   Vitals:   06/16/17 2315 06/16/17 2355 06/17/17 0025 06/17/17 0521  BP: 117/77 133/78 118/75 116/71  Pulse: 91 85 86 94  Resp: 18   15  Temp:  98.3 F (36.8 C)  99.3 F (37.4 C)  TempSrc:  Oral  Oral  SpO2: 100% 100% 100% 100%  Weight:  125 lb 0 oz (56.7 kg)  122 lb 3.2 oz (55.4 kg)  Height:  5\' 6"  (1.676 m)      Intake/Output Summary (Last 24 hours) at 06/17/2017 1226 Last data filed at 06/17/2017 0533 Gross per 24 hour  Intake 120 ml  Output 200 ml  Net -80 ml   Filed Weights   06/16/17 1637 06/16/17 2355 06/17/17 0521  Weight: 125 lb (56.7 kg) 125 lb 0 oz (56.7 kg) 122 lb 3.2 oz (55.4 kg)   Body mass index is 19.72 kg/m.  General:  Well nourished, frail in no acute distress HEENT: normal Lymph: no adenopathy Neck: no JVD Endocrine:  No thryomegaly Vascular: No carotid bruits; FA pulses 2+ bilaterally without bruits  Cardiac:  normal S1, S2; RRR; no murmur  Lungs:  Dry basilar crackles Abd: soft, nontender, no hepatomegaly  Ext: no edema Musculoskeletal:  No deformities, BUE and BLE  strength normal and equal Skin: warm and dry  Neuro:  CNs 2-12 intact, no focal abnormalities noted Psych:  Normal affect   EKG:  The EKG was personally reviewed and demonstrates:  Sinus rhythm, right axis deviation. Telemetry:  Telemetry was personally reviewed and demonstrates:  Sinus rhythm  Laboratory Data:  Chemistry Recent Labs  Lab 06/16/17 1629 06/17/17 0559  NA 133* 135  K 3.2* 3.5  CL 99* 101  CO2 26 26  GLUCOSE 106* 80  BUN 7 7  CREATININE 0.78 0.81  CALCIUM 8.3* 8.1*  GFRNONAA >60 >  60  GFRAA >60 >60  ANIONGAP 8 8    Recent Labs  Lab 06/16/17 1629 06/17/17 0559  PROT 6.9 6.4*  ALBUMIN 3.0* 2.7*  AST 48* 44*  ALT 26 24  ALKPHOS 149* 122  BILITOT 0.4 0.6   Hematology Recent Labs  Lab 06/16/17 1629 06/17/17 0559  WBC 3.3* 2.4*  RBC 2.85* 2.57*  HGB 9.5* 8.4*  HCT 28.9* 25.7*  MCV 101.4* 100.0  MCH 33.3 32.7  MCHC 32.9 32.7  RDW 17.5* 17.4*  PLT 230 226   Cardiac Enzymes Recent Labs  Lab 06/17/17 0131 06/17/17 0559 06/17/17 1123  TROPONINI <0.03 <0.03 <0.03    Recent Labs  Lab 06/16/17 1735  TROPIPOC 0.54*     DDimer  Recent Labs  Lab 06/16/17 1725  DDIMER 2.84*    Radiology/Studies:  Dg Chest 2 View  Result Date: 06/16/2017 CLINICAL DATA:  61 year old female with history of cough and intermittent chest pain for the past 2 months. EXAM: CHEST  2 VIEW COMPARISON:  Chest x-ray 04/06/2017. FINDINGS: Mild diffuse interstitial prominence most evident throughout the mid to lower lungs bilaterally, increasing compared to the prior examination. Lung volumes are normal. No consolidative airspace disease. No pleural effusions. No pneumothorax. No pulmonary nodule or mass noted. Pulmonary vasculature and the cardiomediastinal silhouette are within normal limits. Aortic atherosclerosis. IMPRESSION: 1. No definite acute cardiopulmonary disease. However, there is evidence of progressive interstitial lung disease. Followup nonemergent  high-resolution chest CT is recommended in the near future to better evaluate these findings. 2. Aortic atherosclerosis. Electronically Signed   By: Trudie Reed M.D.   On: 06/16/2017 17:23   Ct Angio Chest Pe W/cm &/or Wo Cm  Result Date: 06/16/2017 CLINICAL DATA:  Cough and rib pain for 2 months. Smoker. Rheumatoid arthritis. EXAM: CT ANGIOGRAPHY CHEST WITH CONTRAST TECHNIQUE: Multidetector CT imaging of the chest was performed using the standard protocol during bolus administration of intravenous contrast. Multiplanar CT image reconstructions and MIPs were obtained to evaluate the vascular anatomy. CONTRAST:  ISOVUE-370 IOPAMIDOL (ISOVUE-370) INJECTION 76% COMPARISON:  Plain films of earlier today.  Chest CT of 03/10/2017. FINDINGS: Cardiovascular: The quality of this exam for evaluation of pulmonary embolism is good-excellent. No evidence of pulmonary embolism. Aortic and branch vessel atherosclerosis. Tortuous thoracic aorta. Mild cardiomegaly, without pericardial effusion. Pulmonary artery enlargement, outflow tract 3.2 cm. Multivessel coronary artery atherosclerosis. Mediastinum/Nodes: Multiple small axillary nodes are decreased compared to the 2017 exam. Upper normal right hilar nodal tissue at 12 mm, similar. Mildly dilated esophagus with fluid level within. Example image 82/series 6. Prevascular node of 9 mm on image 86/series 6 is similar. Lungs/Pleura: Trace bilateral pleural fluid is new since the prior CT. Mild centrilobular emphysema. 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 This is similar to minimally progressive since 03/10/2016. Upper Abdomen: Normal imaged portions of the liver, spleen, stomach, pancreas, adrenal glands, left kidney. Musculoskeletal: No acute osseous abnormality. Bilateral humeral head avascular necrosis on series 10. Review of the MIP images confirms the above findings. IMPRESSION: 1.  No evidence of pulmonary embolism. 2.  Similar to slight increase in bibasilar reticulation and traction bronchiolectasis. Considerations include usual interstitial pneumonitis (which can be seen with collagen vascular disease) or nonspecific interstitial pneumonitis. Consider evaluation with dedicated high-resolution chest CT at 6 months. 3. Trace bilateral pleural effusions, new. 4. Bilateral humeral head avascular necrosis. 5. Esophageal air fluid level suggests dysmotility or gastroesophageal reflux. 6. Pulmonary artery enlargement suggests pulmonary arterial hypertension. Electronically Signed   By: Ronaldo Miyamoto  Reche Dixon M.D.   On: 06/16/2017 19:57    Assessment and Plan:   1. Chest pain-symptoms only occur with coughing. Electrocardiogram shows no ST changes. Recent nuclear study showed no ischemia or infarction. Point-of-care troponin normal but follow-up troponins negative. Pain is not consistent with cardiac etiology and no further ischemia evaluation warranted.  2. Syncope-her event sounds likely to be orthostatic mediated. She has decreased by mouth intake and her event occurred immediately after standing. Await echocardiogram performed earlier today. If LV function remains normal would not pursue further evaluation. If she has more events in the future could consider a monitor. I have encouraged by mouth fluid intake. Note patient does not drive and I instructed her to continue avoiding for 6 months. 3. Tobacco abuse-patient counseled on discontinuing. 4. Coronary artery disease-continue aspirin and statin. 5. Hypertension-With orthostatic symptoms would decrease HCTZ 12.5 mg daily and follow. 6. Anemia/neutropenia-further evaluation per primary care.  Patient can be discharged from a cardiac standpoint if echocardiogram shows normal LV function. She can follow-up with Dr. Tenny Craw in 6-8 weeks.   For questions or updates, please contact [facility] Please consult [email or web address] for contact info under Cardiology/STEMI.   Signed, Olga Millers, MD  06/17/2017 12:26 PM

## 2017-06-17 NOTE — Progress Notes (Signed)
Echocardiogram 2D Echocardiogram has been performed.  Dorothey Baseman 06/17/2017, 12:18 PM

## 2017-06-17 NOTE — Discharge Summary (Signed)
Family Medicine Teaching [facility] Discharge Summary  Patient name: Jocelyn Shelton Medical record number: [id number] Date of birth: [birth year] Age: 61 y.o. Gender: female Date of Admission: 06/16/2017  Date of Discharge: 06/17/2017 Admitting Physician: Leighton Roach McDiarmid, MD  Primary Care Provider: Wendee Beavers, DO Consultants: Cardiology  Indication for Hospitalization: chest pain with near syncope  Discharge Diagnoses/Problem List:  Principal Problem:   Near syncope   Chest pain Active Problems:   Tobacco use disorder   GERD (gastroesophageal reflux disease)   Rheumatoid arthritis (HCC)   COPD (chronic obstructive pulmonary disease) (HCC)   Scleroderma (HCC)   Immunosuppressed status (HCC)   Generalized weakness   Coronary artery disease involving native coronary artery of native heart without angina pectoris   Hypoalbuminemia due to protein-calorie malnutrition (HCC)   Leg pain, diffuse   Dilation of esophagus   Bronchiectasis (HCC)  Disposition: Home  Discharge Condition: Improved  Discharge Exam:  Blood pressure 117/71, pulse 83, temperature 98.6 F (37 C), temperature source Oral, resp. rate 15, height 5\' 6"  (1.676 m), weight 122 lb 3.2 oz (55.4 kg), SpO2 100 %. Physical Exam: General: 61 year old female chronically ill lying in bed appearing comfortable and in no acute distress Cardiovascular: Regular rate and rhythm, no apparent murmurs Respiratory: Normal work of breathing, diffuse crackles, no wheezing Abdomen: Soft, nontender nondistended no organomegaly Extremities: Bilateral lower extremity edema, tender to palpation with light touch bilateral lower extremities  Brief Hospital Course:  Jocelyn Shelton is a 61 y.o. female who presented with near syncope with prodrome of flushing and chest pain with elevated i-stat troponin. PMH is significant for scleroderma, rheumatoid arthritis, COPD, CAD with MI October 2016 s/p 2 stents. Patient did not have  shaking and had no loss of bowel or bladder incontinence, suggesting against seizure. Chest pain is reproducible and chronic. Elevated i-stat troponin to 0.54 with 3 subsequent negative troponins and EKG showing normal sinus rhythm. Initial concern for pulmonary embolism with d-dimer of 2.84, but CTA showed no PE. Chest x-ray demonstrated known progressive interstitial lung disease.   Cardiology was consulted and felt that chest pain was non-cardiac in nature, especially as symptoms occur with coughing; suspected syncope was likely orthostatic mediated at the time, though orthostatic vital signs in the hospital were negative. Hence, home hctz 25 mg was decreased to 12.5 mg daily. Per Cardiology, patient had nuclear study August 2018 that showed normal LV function and no ischemia or infarction. ECHO 06/17/17 with EF of 55-60% and G2DD. Patient deemed safe for discharge, as LV function remained normal. Patient already has scheduled follow-up with Cardiology next month.   Issues for Follow Up:  1. BP on reduced dose of HCTZ 2. Continue to discuss tobacco cessation 3. Any further episodes, consider event monitor  Significant Procedures: ECHO  Significant Labs and Imaging:  Recent Labs  Lab 06/16/17 1629 06/17/17 0559  WBC 3.3* 2.4*  HGB 9.5* 8.4*  HCT 28.9* 25.7*  PLT 230 226   Recent Labs  Lab 06/16/17 1629 06/17/17 0559  NA 133* 135  K 3.2* 3.5  CL 99* 101  CO2 26 26  GLUCOSE 106* 80  BUN 7 7  CREATININE 0.78 0.81  CALCIUM 8.3* 8.1*  ALKPHOS 149* 122  AST 48* 44*  ALT 26 24  ALBUMIN 3.0* 2.7*    Results/Tests Pending at Time of Discharge:   Dg Chest 2 View  Result Date: 06/16/2017 CLINICAL DATA:  61 year old female with history of cough and intermittent chest pain for  the past 2 months. EXAM: CHEST  2 VIEW COMPARISON:  Chest x-ray 04/06/2017. FINDINGS: Mild diffuse interstitial prominence most evident throughout the mid to lower lungs bilaterally, increasing compared to the  prior examination. Lung volumes are normal. No consolidative airspace disease. No pleural effusions. No pneumothorax. No pulmonary nodule or mass noted. Pulmonary vasculature and the cardiomediastinal silhouette are within normal limits. Aortic atherosclerosis. IMPRESSION: 1. No definite acute cardiopulmonary disease. However, there is evidence of progressive interstitial lung disease. Followup nonemergent high-resolution chest CT is recommended in the near future to better evaluate these findings. 2. Aortic atherosclerosis. Electronically Signed   By: Trudie Reed M.D.   On: 06/16/2017 17:23   Ct Angio Chest Pe W/cm &/or Wo Cm  Result Date: 06/16/2017 CLINICAL DATA:  Cough and rib pain for 2 months. Smoker. Rheumatoid arthritis. EXAM: CT ANGIOGRAPHY CHEST WITH CONTRAST TECHNIQUE: Multidetector CT imaging of the chest was performed using the standard protocol during bolus administration of intravenous contrast. Multiplanar CT image reconstructions and MIPs were obtained to evaluate the vascular anatomy. CONTRAST:  ISOVUE-370 IOPAMIDOL (ISOVUE-370) INJECTION 76% COMPARISON:  Plain films of earlier today.  Chest CT of 03/10/2017. FINDINGS: Cardiovascular: The quality of this exam for evaluation of pulmonary embolism is good-excellent. No evidence of pulmonary embolism. Aortic and branch vessel atherosclerosis. Tortuous thoracic aorta. Mild cardiomegaly, without pericardial effusion. Pulmonary artery enlargement, outflow tract 3.2 cm. Multivessel coronary artery atherosclerosis. Mediastinum/Nodes: Multiple small axillary nodes are decreased compared to the 2017 exam. Upper normal right hilar nodal tissue at 12 mm, similar. Mildly dilated esophagus with fluid level within. Example image 82/series 6. Prevascular node of 9 mm on image 86/series 6 is similar. Lungs/Pleura: Trace bilateral pleural fluid is new since the prior CT. Mild centrilobular emphysema. Bibasilar predominant reticulation with probable  traction bronchiolectasis. This is similar to minimally progressive since 03/10/2016. Upper Abdomen: Normal imaged portions of the liver, spleen, stomach, pancreas, adrenal glands, left kidney. Musculoskeletal: No acute osseous abnormality. Bilateral humeral head avascular necrosis on series 10. Review of the MIP images confirms the above findings. IMPRESSION: 1.  No evidence of pulmonary embolism. 2. Similar to slight increase in bibasilar reticulation and traction bronchiolectasis. Considerations include usual interstitial pneumonitis (which can be seen with collagen vascular disease) or nonspecific interstitial pneumonitis. Consider evaluation with dedicated high-resolution chest CT at 6 months. 3. Trace bilateral pleural effusions, new. 4. Bilateral humeral head avascular necrosis. 5. Esophageal air fluid level suggests dysmotility or gastroesophageal reflux. 6. Pulmonary artery enlargement suggests pulmonary arterial hypertension. Electronically Signed   By: Jeronimo Greaves M.D.   On: 06/16/2017 19:57     Discharge Medications:  Allergies as of 06/17/2017      Reactions   Ticagrelor Shortness Of Breath   Cymbalta [duloxetine Hcl] Diarrhea, Nausea Only   No appetite, stomach pain   Olanzapine Other (See Comments)   Interfered with Blood pressure medications   Aspirin Other (See Comments)   Stomach cramps   Lisinopril Cough   Tomato Rash      Medication List    TAKE these medications   acetaminophen 500 MG tablet Commonly known as:  TYLENOL Take 1,000 mg by mouth daily as needed for pain.   albuterol 108 (90 Base) MCG/ACT inhaler Commonly known as:  PROVENTIL HFA INHALE TWO PUFFS BY MOUTH EVERY 4 HOURS AS NEEDED FOR WHEEZING FOR COUGH FOR SHORTNESS OF BREATH   aspirin 81 MG EC tablet Take 1 tablet (81 mg total) by mouth daily.   atorvastatin 40 MG tablet  Commonly known as:  LIPITOR Take 1 tablet (40 mg total) by mouth daily at 6 PM.   ATROVENT HFA 17 MCG/ACT inhaler Generic drug:   ipratropium INHALE TWO PUFFS BY MOUTH ONCE DAILY AS NEEDED FOR WHEEZING   Black Cohosh 40 MG Caps Take 1 capsule by mouth 2 (two) times daily.   CALCIUM 600+D3 PO Take 1 tablet by mouth daily.   clopidogrel 75 MG tablet Commonly known as:  PLAVIX Take 1 tablet (75 mg total) by mouth daily. DO NOT START UNTIL YOU FINISH YOUR EFFIENT   DIOVAN 80 MG tablet Generic drug:  valsartan Take 1 tablet (80 mg total) by mouth daily.   dorzolamide-timolol 22.3-6.8 MG/ML ophthalmic solution Commonly known as:  COSOPT Place 1 drop into both eyes 2 (two) times daily.   famotidine 20 MG tablet Commonly known as:  PEPCID Take 1 tablet (20 mg total) by mouth at bedtime.   feeding supplement (ENSURE ENLIVE) Liqd Take 237 mLs by mouth 2 (two) times daily between meals.   fluticasone 50 MCG/ACT nasal spray Commonly known as:  FLONASE USE TWO SPRAY(S) IN EACH NOSTRIL DAILY AS NEEDED FOR  RHINITIS   folic acid 1 MG tablet Commonly known as:  FOLVITE TAKE 1 TABLET (1 MG TOTAL) BY MOUTH DAILY.   hydrochlorothiazide 25 MG tablet Commonly known as:  HYDRODIURIL Take 0.5 tablets (12.5 mg total) by mouth daily. What changed:  how much to take   meloxicam 15 MG tablet Commonly known as:  MOBIC TAKE 1 TABLET BY MOUTH EVERY DAY   methotrexate 2.5 MG tablet Commonly known as:  RHEUMATREX TAKE 4 TABLETS (10MG ) BY MOUTH 2 TIMES A WEEK EVERY MONDAY AND TUESDAY   metoprolol tartrate 50 MG tablet Commonly known as:  LOPRESSOR Take 1 tablet (50 mg total) by mouth 2 (two) times daily.   multivitamin with minerals Tabs tablet Take 1 tablet by mouth daily.   nitroGLYCERIN 0.4 MG SL tablet Commonly known as:  NITROSTAT Place 0.4 mg under the tongue every 5 (five) minutes as needed for chest pain.   omeprazole 40 MG capsule Commonly known as:  PRILOSEC TAKE ONE CAPSULE BY MOUTH  DAILY   sulfaSALAzine 500 MG tablet Commonly known as:  AZULFIDINE Take 2 tablets (1,000 mg total) by mouth 2 (two)  times daily.       Discharge Instructions: Please refer to Patient Instructions section of EMR for full details.  Patient was counseled important signs and symptoms that should prompt return to medical care, changes in medications, dietary instructions, activity restrictions, and follow up appointments.   Follow-Up Appointments: Follow-up Information    Pricilla Riffle, MD. Go on 07/02/2017.   Specialty:  Cardiology Why:  10 am appointment  Contact information: [address] [place] [address] [phone]        Oralia Manis, DO. Go on 06/23/2017.   Specialty:  Family Medicine Why:  10:15 am hospital follow-up Contact information: [address] [place] [address] [phone]           Casey Burkitt, MD 06/17/2017, 3:42 PM PGY-3, [facility]

## 2017-06-17 NOTE — ED Provider Notes (Signed)
[facility] PROGRESSIVE CARE Provider Note   CSN: [id number] Arrival date & time: 06/16/17  1626     History   Chief Complaint Chief Complaint  Patient presents with  . Loss of Consciousness    HPI Jocelyn Shelton is a 61 y.o. female.  HPI 61 year old African-American female with past medical history significant for scleroderma, rheumatoid arthritis, COPD, CAD with MI status post 2 PCI that presents to the emergency department today for syncopal episode.  The patient states that prior to arrival she was walking in her home from her kitchen when she suddenly felt like "heat" hit her in the face and states that she felt weak and had to be lowered to the floor by her daughter.  Per EMS patient's daughter states that patient completely lost consciousness for 15 seconds.  At that time patient denied any associated chest pain, shortness of breath or heart racing.  The patient states that at this time she does have some dizziness and lightheadedness.  Patient also endorses some pain in her right rib cage states this been ongoing for 2 months.  States that is unchanged.  She also reports a nonproductive cough with it.  Denies any pleuritic chest pain.  Patient is a current smoker of 6-7 cigarettes daily.  She denies any history of DVT/PE, prolonged immobilization or recent hospitalization of surgery, unilateral leg swelling or calf tenderness, hormone use.  Patient has no cardiac history.  She continues to deny any chest pain or shortness of breath at this time.  Patient denies any vision changes or headaches associated with her syncopal episode.  Denies any history of prior.  Pt denies any fever, chill, ha, vision changes, congestion, neck pain, cp, sob, cough, abd pain, n/v/d, urinary symptoms, change in bowel habits, melena, hematochezia, lower extremity paresthesias.  Past Medical History:  Diagnosis Date  . CAD in native artery 05/23/2015  . High cholesterol   . Hypertension   . Leg  pain    ABIs 1/18: R 1.2, L 1.1  . Myocardial infarction (HCC) 04/2015  . Reflux   . Rheumatoid arthritis (HCC)    "all over" (06/16/2017)  . Scleroderma [facility](HCC)     Patient Active Problem List   Diagnosis Date Noted  . Chest pain 06/16/2017  . Generalized weakness   . Coronary artery disease involving native coronary artery of native heart without angina pectoris   . Hyponatremia 03/16/2017  . Claudication in peripheral vascular disease (HCC) 10/28/2016  . Foot ulcer, right (HCC)   . Scleroderma (HCC) 03/08/2016  . Immunosuppressed status (HCC)   . COPD (chronic obstructive pulmonary disease) (HCC) 08/30/2015  . NSTEMI (non-ST elevated myocardial infarction) (HCC)   . Allergic rhinitis 12/03/2014  . Preventative health care 03/26/2014  . Rheumatoid arthritis (HCC) 03/14/2013  . Other pancytopenia (HCC) 03/01/2013  . 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 Erythema nodosum 04/27/2011  . GERD (gastroesophageal reflux disease) 08/02/2008  . Major depressive disorder, recurrent episode (HCC) 09/23/2006  . Tobacco use disorder 09/23/2006  . Primary hypertension 09/23/2006  . Raynauds syndrome 09/23/2006    Past Surgical History:  Procedure Laterality Date  . APPENDECTOMY  90's  . CARDIAC CATHETERIZATION N/A 05/22/2015   Procedure: Left Heart Cath and Coronary Angiography;  Surgeon: Lennette Biharihomas A Kelly, MD;  [facility];  Service: Cardiovascular;  Laterality: N/A;  . CARDIAC CATHETERIZATION N/A 05/22/2015   Procedure: Coronary Stent Intervention;  Surgeon: Lennette Biharihomas A Kelly, MD;  Location: [facility] INVASIVE CV LAB;  Service: Cardiovascular;  Laterality: N/A;    OB History    No data available       Home Medications    Prior to Admission medications   Medication Sig Start Date End Date Taking? Authorizing Provider  atorvastatin (LIPITOR) 40 MG tablet Take 1 tablet (40 mg total) by mouth daily at 6 PM. 07/01/16  Yes Weaver, Lorin Picket T, PA-C  ATROVENT HFA 17  MCG/ACT inhaler INHALE TWO PUFFS BY MOUTH ONCE DAILY AS NEEDED FOR WHEEZING 11/23/16  Yes Wendee Beavers, DO  clopidogrel (PLAVIX) 75 MG tablet Take 1 tablet (75 mg total) by mouth daily. DO NOT START UNTIL YOU FINISH YOUR EFFIENT 01/29/17  Yes Wendee Beavers, DO  DIOVAN 80 MG tablet Take 1 tablet (80 mg total) by mouth daily. 07/08/16  Yes Weaver, Scott T, PA-C  folic acid (FOLVITE) 1 MG tablet TAKE 1 TABLET (1 MG TOTAL) BY MOUTH DAILY. 01/22/15  Yes Karamalegos, Netta Neat, DO  hydrochlorothiazide (HYDRODIURIL) 25 MG tablet Take 1 tablet (25 mg total) by mouth daily. 09/25/16  Yes Wendee Beavers, DO  meloxicam (MOBIC) 15 MG tablet TAKE 1 TABLET BY MOUTH EVERY DAY 05/03/17  Yes Wendee Beavers, DO  metoprolol (LOPRESSOR) 50 MG tablet Take 1 tablet (50 mg total) by mouth 2 (two) times daily. 07/01/16  Yes Weaver, Scott T, PA-C  acetaminophen (TYLENOL) 500 MG tablet Take 1,000 mg by mouth daily as needed for pain.     [provider]  albuterol (PROVENTIL HFA) 108 (90 Base) MCG/ACT inhaler INHALE TWO PUFFS BY MOUTH EVERY 4 HOURS AS NEEDED FOR WHEEZING FOR COUGH FOR SHORTNESS OF BREATH 05/26/17   Wendee Beavers, DO  aspirin EC 81 MG EC tablet Take 1 tablet (81 mg total) by mouth daily. 05/24/15   Barrett, Joline Salt, PA-C  Black Cohosh 40 MG CAPS Take 1 capsule by mouth 2 (two) times daily.    [provider]  Calcium Carb-Cholecalciferol (CALCIUM 600+D3 PO) Take 1 tablet by mouth daily.    [provider]  dorzolamide-timolol (COSOPT) 22.3-6.8 MG/ML ophthalmic solution Place 1 drop into both eyes 2 (two) times daily.    [provider]  famotidine (PEPCID) 20 MG tablet Take 1 tablet (20 mg total) by mouth at bedtime. 05/26/17   Wendee Beavers, DO  fluticasone (FLONASE) 50 MCG/ACT nasal spray USE TWO SPRAY(S) IN EACH NOSTRIL DAILY AS NEEDED FOR  RHINITIS 07/25/15   Althea Charon, Netta Neat, DO  methotrexate (RHEUMATREX) 2.5 MG tablet TAKE 4 TABLETS (10MG ) BY  MOUTH 2 TIMES A WEEK EVERY MONDAY AND TUESDAY 04/20/17   Wendee Beavers, DO  Multiple Vitamin (MULTIVITAMIN WITH MINERALS) TABS tablet Take 1 tablet by mouth daily.    [provider]  nitroGLYCERIN (NITROSTAT) 0.4 MG SL tablet Place 0.4 mg under the tongue every 5 (five) minutes as needed for chest pain.    [provider]  omeprazole (PRILOSEC) 40 MG capsule TAKE ONE CAPSULE BY MOUTH  DAILY 12/25/16   Wendee Beavers, DO  sulfaSALAzine (AZULFIDINE) 500 MG tablet Take 2 tablets (1,000 mg total) by mouth 2 (two) times daily. 05/26/17   Wendee Beavers, DO    Family History Family History  Problem Relation Age of Onset  . Hypertension Father   . Heart attack Mother   . Diabetes Daughter   . Diabetes Sister   . Lung cancer Brother 74    Social History Social History   Tobacco Use  . Smoking status:  Current Every Day Smoker    Packs/day: 0.33    Years: 46.00    Pack years: 15.18    Types: Cigarettes  . Smokeless tobacco: Never Used  Substance Use Topics  . Alcohol use: Yes    Alcohol/week: 2.4 oz    Types: 4 Standard drinks or equivalent per week  . Drug use: No     Allergies   Ticagrelor; Cymbalta [duloxetine hcl]; Olanzapine; Aspirin; Lisinopril; and Tomato   Review of Systems Review of Systems  Constitutional: Negative for chills, diaphoresis and fever.  HENT: Negative for congestion.   Eyes: Negative for visual disturbance.  Respiratory: Negative for cough and shortness of breath.   Cardiovascular: Positive for chest pain. Negative for palpitations and leg swelling.  Gastrointestinal: Negative for abdominal pain, diarrhea, nausea and vomiting.  Genitourinary: Negative for dysuria, flank pain, frequency, hematuria and urgency.  Musculoskeletal: Negative for arthralgias and myalgias.  Skin: Negative for rash.  Neurological: Positive for dizziness, syncope and light-headedness. Negative for weakness, numbness and headaches.    Psychiatric/Behavioral: Negative for sleep disturbance. The patient is not nervous/anxious.      Physical Exam Updated Vital Signs BP 118/75 (BP Location: Right Arm)   Pulse 86   Temp 98.3 F (36.8 C) (Oral)   Resp 18   Ht 5\' 6"  (1.676 m)   Wt 56.7 kg (125 lb 0 oz)   SpO2 100%   BMI 20.18 kg/m   Physical Exam  Constitutional: She is oriented to person, place, and time. She appears well-developed and well-nourished.  Non-toxic appearance. No distress.  Patient is frail appearing and chronically ill-appearing.  HENT:  Head: Normocephalic and atraumatic.  Nose: Nose normal.  Mouth/Throat: Oropharynx is clear and moist.  Eyes: Conjunctivae are normal. Pupils are equal, round, and reactive to light. Right eye exhibits no discharge. Left eye exhibits no discharge.  Neck: Normal range of motion. Neck supple. No JVD present. No tracheal deviation present.  Cardiovascular: Normal rate, regular rhythm, normal heart sounds and intact distal pulses. Exam reveals no gallop and no friction rub.  No murmur heard. Pulmonary/Chest: Effort normal and breath sounds normal. No stridor. No respiratory distress. She has no wheezes. She has no rales. She exhibits no tenderness.  No hypoxia or tachypnea.  Abdominal: Soft. Bowel sounds are normal. She exhibits no distension. There is no tenderness. There is no rebound and no guarding.  Musculoskeletal: Normal range of motion.  No lower extremity edema or calf tenderness.  Lymphadenopathy:    She has no cervical adenopathy.  Neurological: She is alert and oriented to person, place, and time.  The patient is alert, attentive, and oriented x 3. Speech is clear. Cranial nerve II-VII grossly intact. Negative pronator drift. Sensation intact. Strength 5/5 in all extremities. Reflexes 2+ and symmetric at biceps, triceps, knees, and ankles. Rapid alternating movement and fine finger movements intact. Romberg is absent. Posture and gait normal.   Skin: Skin  is warm and dry. Capillary refill takes less than 2 seconds. She is not diaphoretic.  Psychiatric: Her behavior is normal. Judgment and thought content normal.  Nursing note and vitals reviewed.    ED Treatments / Results  Labs (all labs ordered are listed, but only abnormal results are displayed) Labs Reviewed  CBC WITH DIFFERENTIAL/PLATELET - Abnormal; Notable for the following components:      Result Value   WBC 3.3 (*)    RBC 2.85 (*)    Hemoglobin 9.5 (*)    HCT 28.9 (*)  MCV 101.4 (*)    RDW 17.5 (*)    Lymphs Abs 0.4 (*)    All other components within normal limits  COMPREHENSIVE METABOLIC PANEL - Abnormal; Notable for the following components:   Sodium 133 (*)    Potassium 3.2 (*)    Chloride 99 (*)    Glucose, Bld 106 (*)    Calcium 8.3 (*)    Albumin 3.0 (*)    AST 48 (*)    Alkaline Phosphatase 149 (*)    All other components within normal limits  D-DIMER, QUANTITATIVE (NOT AT [facility]) - Abnormal; Notable for the following components:   D-Dimer, Quant 2.84 (*)    All other components within normal limits  CBG MONITORING, ED - Abnormal; Notable for the following components:   Glucose-Capillary 105 (*)    All other components within normal limits  I-STAT TROPONIN, ED - Abnormal; Notable for the following components:   Troponin i, poc 0.54 (*)    All other components within normal limits  TROPONIN I  TROPONIN I  TROPONIN I  COMPREHENSIVE METABOLIC PANEL  CBC    EKG  EKG Interpretation  Date/Time:  Wednesday June 16 2017 16:32:33 EST Ventricular Rate:  80 PR Interval:    QRS Duration: 72 QT Interval:  396 QTC Calculation: 457 R Axis:   96 Text Interpretation:  Sinus rhythm Borderline short PR interval Right axis deviation No significant change since last tracing no wpw prolonged qt or brugada Confirmed by Melene Plan [phone]) on 06/16/2017 5:42:00 PM       Radiology Dg Chest 2 View  Result Date: 06/16/2017 CLINICAL DATA:  61 year old female  with history of cough and intermittent chest pain for the past 2 months. EXAM: CHEST  2 VIEW COMPARISON:  Chest x-ray 04/06/2017. FINDINGS: Mild diffuse interstitial prominence most evident throughout the mid to lower lungs bilaterally, increasing compared to the prior examination. Lung volumes are normal. No consolidative airspace disease. No pleural effusions. No pneumothorax. No pulmonary nodule or mass noted. Pulmonary vasculature and the cardiomediastinal silhouette are within normal limits. Aortic atherosclerosis. IMPRESSION: 1. No definite acute cardiopulmonary disease. However, there is evidence of progressive interstitial lung disease. Followup nonemergent high-resolution chest CT is recommended in the near future to better evaluate these findings. 2. Aortic atherosclerosis. Electronically Signed   By: Trudie Reed M.D.   On: 06/16/2017 17:23   Ct Angio Chest Pe W/cm &/or Wo Cm  Result Date: 06/16/2017 CLINICAL DATA:  Cough and rib pain for 2 months. Smoker. Rheumatoid arthritis. EXAM: CT ANGIOGRAPHY CHEST WITH CONTRAST TECHNIQUE: Multidetector CT imaging of the chest was performed using the standard protocol during bolus administration of intravenous contrast. Multiplanar CT image reconstructions and MIPs were obtained to evaluate the vascular anatomy. CONTRAST:  ISOVUE-370 IOPAMIDOL (ISOVUE-370) INJECTION 76% COMPARISON:  Plain films of earlier today.  Chest CT of 03/10/2017. FINDINGS: Cardiovascular: The quality of this exam for evaluation of pulmonary embolism is good-excellent. No evidence of pulmonary embolism. Aortic and branch vessel atherosclerosis. Tortuous thoracic aorta. Mild cardiomegaly, without pericardial effusion. Pulmonary artery enlargement, outflow tract 3.2 cm. Multivessel coronary artery atherosclerosis. Mediastinum/Nodes: Multiple small axillary nodes are decreased compared to the 2017 exam. Upper normal right hilar nodal tissue at 12 mm, similar. Mildly dilated  esophagus with fluid level within. Example image 82/series 6. Prevascular node of 9 mm on image 86/series 6 is similar. Lungs/Pleura: Trace bilateral pleural fluid is new since the prior CT. Mild centrilobular emphysema. 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  This is similar to minimally progressive since 03/10/2016. Upper Abdomen: Normal imaged portions of the liver, spleen, stomach, pancreas, adrenal glands, left kidney. Musculoskeletal: No acute osseous abnormality. Bilateral humeral head avascular necrosis on series 10. Review of the MIP images confirms the above findings. IMPRESSION: 1.  No evidence of pulmonary embolism. 2. Similar to slight increase in bibasilar reticulation and traction bronchiolectasis. Considerations include usual interstitial pneumonitis (which can be seen with collagen vascular disease) or nonspecific interstitial pneumonitis. Consider evaluation with dedicated high-resolution chest CT at 6 months. 3. Trace bilateral pleural effusions, new. 4. Bilateral humeral head avascular necrosis. 5. Esophageal air fluid level suggests dysmotility or gastroesophageal reflux. 6. Pulmonary artery enlargement suggests pulmonary arterial hypertension. Electronically Signed   By: Jeronimo Greaves M.D.   On: 06/16/2017 19:57    Procedures Procedures (including critical care time)  Medications Ordered in ED Medications  albuterol (PROVENTIL) (2.5 MG/3ML) 0.083% nebulizer solution 3 mL (not administered)  aspirin EC tablet 81 mg (not administered)  atorvastatin (LIPITOR) tablet 40 mg (not administered)  ipratropium (ATROVENT) nebulizer solution 0.5 mg (not administered)  clopidogrel (PLAVIX) tablet 75 mg (not administered)  irbesartan (AVAPRO) tablet 75 mg (not administered)  dorzolamide-timolol (COSOPT) 22.3-6.8 MG/ML ophthalmic solution 1 drop (not administered)  famotidine (PEPCID) tablet 20 mg (not administered)  folic acid (FOLVITE) tablet 1 mg (not  administered)  hydrochlorothiazide (HYDRODIURIL) tablet 25 mg (not administered)  meloxicam (MOBIC) tablet 15 mg (not administered)  metoprolol tartrate (LOPRESSOR) tablet 50 mg (not administered)  sulfaSALAzine (AZULFIDINE) tablet 1,000 mg (not administered)  nitroGLYCERIN (NITROSTAT) SL tablet 0.4 mg (not administered)  pantoprazole (PROTONIX) EC tablet 40 mg (not administered)  Influenza vac split quadrivalent PF (FLUARIX) injection 0.5 mL (not administered)  feeding supplement (ENSURE ENLIVE) (ENSURE ENLIVE) liquid 237 mL (not administered)  ipratropium-albuterol (DUONEB) 0.5-2.5 (3) MG/3ML nebulizer solution 3 mL (3 mLs Nebulization Given 06/16/17 1804)  iopamidol (ISOVUE-370) 76 % injection (100 mLs  Contrast Given 06/16/17 1908)  fentaNYL (SUBLIMAZE) injection 50 mcg (50 mcg Intravenous Given 06/16/17 2021)     Initial Impression / Assessment and Plan / ED Course  I have reviewed the triage vital signs and the nursing notes.  Pertinent labs & imaging results that were available during my care of the patient were reviewed by me and considered in my medical decision making (see chart for details).     Patient resents to the ED for evaluation of syncope.  Patient also reports some right-sided rib pain that has been ongoing for 2 months.  Patient denies any head injury.  Does report loss of consciousness for about 15 seconds.  Denies any associated chest pain or shortness of breath.  On exam patient is overall well-appearing and nontoxic.  Vital signs are reassuring.  Patient is afebrile.  Lungs clear to auscultation bilaterally.  Regular rate and rhythm without any murmurs rubs or gallops.  Abdominal exam is benign without any focal tenderness.  No lower extremity edema or calf tenderness.  No leukocytosis noted.  Hemoglobin is 9.5 and appears the patient's baseline.  Mild hypokalemia at 3.2.  Otherwise electrolytes are reassuring.  Patient's troponin was 0.5.  Given her elevated  troponin, syncopal episode and rib pain concern for possible PE.  D-dimer was elevated at 2.8.  CTA of chest was performed that showed no appreciable PE.  Does note chronic changes and pulmonary hypertension.  Talk with Dr. Dimple Casey with cardiology concerning the elevated troponin.  He felt that patient has a history of same.  She  was admitted in August for chest pain with elevated troponin with a normal stress test.  Felt that this could be admitted by hospital medicine that could trend the troponins.  If they need to reconsult cardiology they are free to do so.  Did not feel the patient needed emergent cardiology intervention at this time.  Spoke with family medicine teaching service who agrees to admission and will see patient in the ED and place admission orders.  Patient remains hemodynamically stable this time.  It was updated on plan of care.  Patient was discussed with my attending Dr. Adela Lank who is agreed with the above plan.  Final Clinical Impressions(s) / ED Diagnoses   Final diagnoses:  Chest pain, unspecified type    ED Discharge Orders    None       Wallace Keller 06/17/17 0119    Melene Plan, DO 06/17/17 1511

## 2017-06-23 ENCOUNTER — Other Ambulatory Visit: Payer: Self-pay | Admitting: Physician Assistant

## 2017-06-23 ENCOUNTER — Inpatient Hospital Stay: Payer: Medicaid Other | Admitting: Family Medicine

## 2017-06-24 ENCOUNTER — Encounter: Payer: Self-pay | Admitting: Internal Medicine

## 2017-06-25 ENCOUNTER — Other Ambulatory Visit: Payer: Self-pay | Admitting: Family Medicine

## 2017-06-25 DIAGNOSIS — M069 Rheumatoid arthritis, unspecified: Secondary | ICD-10-CM

## 2017-06-25 DIAGNOSIS — K219 Gastro-esophageal reflux disease without esophagitis: Secondary | ICD-10-CM

## 2017-06-25 MED ORDER — METHOTREXATE 2.5 MG PO TABS: ORAL_TABLET | ORAL | 0 refills | Status: DC

## 2017-06-25 MED ORDER — PANTOPRAZOLE SODIUM 40 MG PO TBEC: 40.0000 mg | DELAYED_RELEASE_TABLET | Freq: Every day | ORAL | 3 refills | Status: DC

## 2017-06-29 ENCOUNTER — Other Ambulatory Visit: Payer: Self-pay | Admitting: Family Medicine

## 2017-06-29 DIAGNOSIS — J449 Chronic obstructive pulmonary disease, unspecified: Secondary | ICD-10-CM

## 2017-07-02 ENCOUNTER — Ambulatory Visit: Payer: Medicaid Other | Admitting: Internal Medicine

## 2017-07-22 NOTE — Progress Notes (Deleted)
Cardiology Office Note    Date:  07/22/2017   ID:  Jocelyn Shelton, DOB [birth year], MRN [id number]  PCP:  Wendee Beavers, DO  Cardiologist: Dr. Tenny Craw  Chief Complaint: 3 Months follow up  History of Present Illness:   Jocelyn Shelton is a 61 y.o. female hx of CAD, HTN, Scleroderma, HLD, tobacco abuse and RA presents for follow up.   Hx of CAD s/p NSTEMI in 2016>>> Cath pt underwent PCI/DES to P RCA; PCI/DES to LCx.  Low risk myoivew 02/2017 without evidence of ischemia or prior infract.   Seen by Dr. Jens Som in ER for syncope and chest pain while coughing. Her event sounded likely to be orthostatic mediated. She has decreased by mouth intake and her event occurred immediately after standing. Echo with normal LVEF. Plan to get monitor if recurrent syncope.   Here today for follow up.     Past Medical History:  Diagnosis Date  . Avascular necrosis of humeral head, left (HCC) 06/17/2017  . Bronchiectasis (HCC) 06/17/2017  . CAD in native artery 05/23/2015  . High cholesterol   . Hypertension   . Leg pain    ABIs 1/18: R 1.2, L 1.1  . Myocardial infarction (HCC) 04/2015  . Reflux   . Rheumatoid arthritis (HCC)    "all over" (06/16/2017)  . Scleroderma [facility])     Past Surgical History:  Procedure Laterality Date  . APPENDECTOMY  90's  . CARDIAC CATHETERIZATION N/A 05/22/2015   Procedure: Left Heart Cath and Coronary Angiography;  Surgeon: Lennette Bihari, MD;  Location: [facility] INVASIVE CV LAB;  Service: Cardiovascular;  Laterality: N/A;  . CARDIAC CATHETERIZATION N/A 05/22/2015   Procedure: Coronary Stent Intervention;  Surgeon: Lennette Bihari, MD;  Location: [facility] INVASIVE CV LAB;  Service: Cardiovascular;  Laterality: N/A;    Current Medications: Prior to Admission medications   Medication Sig Start Date End Date Taking? Authorizing Provider  acetaminophen (TYLENOL) 500 MG tablet Take 1,000 mg by mouth daily as needed for pain.     [provider]  albuterol  (PROVENTIL HFA) 108 (90 Base) MCG/ACT inhaler INHALE TWO PUFFS BY MOUTH EVERY 4 HOURS AS NEEDED FOR WHEEZING FOR COUGH FOR SHORTNESS OF BREATH 05/26/17   Wendee Beavers, DO  aspirin EC 81 MG EC tablet Take 1 tablet (81 mg total) by mouth daily. 05/24/15   Barrett, Joline Salt, PA-C  atorvastatin (LIPITOR) 40 MG tablet Take 1 tablet (40 mg total) by mouth daily at 6 PM. 07/01/16   Tereso Newcomer T, PA-C  ATROVENT HFA 17 MCG/ACT inhaler TAKE 2 PUFFS EVERY DAY AS NEEDED FOR WHEEZING 06/29/17   Wendee Beavers, DO  Black Cohosh 40 MG CAPS Take 1 capsule by mouth 2 (two) times daily.    [provider]  Calcium Carb-Cholecalciferol (CALCIUM 600+D3 PO) Take 1 tablet by mouth daily.    [provider]  clopidogrel (PLAVIX) 75 MG tablet Take 1 tablet (75 mg total) by mouth daily. DO NOT START UNTIL YOU FINISH YOUR EFFIENT 01/29/17   Wendee Beavers, DO  DIOVAN 80 MG tablet TAKE 1 TABLET BY MOUTH EVERY DAY 06/23/17   Tereso Newcomer T, PA-C  dorzolamide-timolol (COSOPT) 22.3-6.8 MG/ML ophthalmic solution Place 1 drop into both eyes 2 (two) times daily.    [provider]  famotidine (PEPCID) 20 MG tablet Take 1 tablet (20 mg total) by mouth at bedtime. 05/26/17   Wendee Beavers, DO  feeding supplement, ENSURE ENLIVE, (ENSURE ENLIVE)  LIQD Take 237 mLs by mouth 2 (two) times daily between meals. 06/17/17   Casey Burkitt, MD  fluticasone [facility]) 50 MCG/ACT nasal spray USE TWO SPRAY(S) IN EACH NOSTRIL DAILY AS NEEDED FOR  RHINITIS 07/25/15   Althea Charon, Netta Neat, DO  folic acid (FOLVITE) 1 MG tablet TAKE 1 TABLET (1 MG TOTAL) BY MOUTH DAILY. 01/22/15   Karamalegos, Netta Neat, DO  hydrochlorothiazide (HYDRODIURIL) 25 MG tablet Take 0.5 tablets (12.5 mg total) by mouth daily. 06/17/17   Casey Burkitt, MD  meloxicam [facility]) 15 MG tablet TAKE 1 TABLET BY MOUTH EVERY DAY 05/03/17   Wendee Beavers, DO  methotrexate (RHEUMATREX) 2.5 MG tablet TAKE 4 TABLETS (10MG )  BY MOUTH 2 TIMES A WEEK EVERY MONDAY AND TUESDAY 06/25/17   Wendee Beavers, DO  metoprolol (LOPRESSOR) 50 MG tablet Take 1 tablet (50 mg total) by mouth 2 (two) times daily. 07/01/16   Tereso Newcomer T, PA-C  Multiple Vitamin (MULTIVITAMIN WITH MINERALS) TABS tablet Take 1 tablet by mouth daily.    [provider]  nitroGLYCERIN (NITROSTAT) 0.4 MG SL tablet Place 0.4 mg under the tongue every 5 (five) minutes as needed for chest pain.    [provider]  nitroGLYCERIN (NITROSTAT) 0.4 MG SL tablet PLACE 1 TABLET UNDER THE TONGUE EVERY 5 MINUTES AS NEEDED FOR CHEST PAIN 06/23/17   Barrett, Joline Salt, PA-C  pantoprazole (PROTONIX) 40 MG tablet Take 1 tablet (40 mg total) by mouth daily. 06/25/17   Wendee Beavers, DO  sulfaSALAzine (AZULFIDINE) 500 MG tablet Take 2 tablets (1,000 mg total) by mouth 2 (two) times daily. 05/26/17   Wendee Beavers, DO    Allergies:   Ticagrelor; Cymbalta [duloxetine hcl]; Olanzapine; Aspirin; Lisinopril; and Tomato   Social History   Socioeconomic History  . Marital status: Divorced    Spouse name: Not on file  . Number of children: 1  . Years of education: Not on file  . Highest education level: Not on file  Social Needs  . Financial resource strain: Not on file  . Food insecurity - worry: Not on file  . Food insecurity - inability: Not on file  . Transportation needs - medical: Not on file  . Transportation needs - non-medical: Not on file  Occupational History  . Occupation: Retired  Tobacco Use  . Smoking status: Current Every Day Smoker    Packs/day: 0.33    Years: 46.00    Pack years: 15.18    Types: Cigarettes  . Smokeless tobacco: Never Used  Substance and Sexual Activity  . Alcohol use: Yes    Alcohol/week: 2.4 oz    Types: 4 Standard drinks or equivalent per week  . Drug use: No  . Sexual activity: No  Other Topics Concern  . Not on file  Social History Narrative  . Not on file     Family History:  The  patient's family history includes Diabetes in her daughter and sister; Heart attack in her mother; Hypertension in her father; Lung cancer (age of onset: 67) in her brother. ***  ROS:   Please see the history of present illness.    ROS All other systems reviewed and are negative.   PHYSICAL EXAM:   VS:  There were no vitals taken for this visit.   GEN: Well nourished, well developed, in no acute distress  HEENT: normal  Neck: no JVD, carotid bruits, or masses Cardiac: ***RRR; no murmurs, rubs, or gallops,no edema  Respiratory:  clear to auscultation bilaterally, normal work of breathing GI: soft, nontender, nondistended, + BS MS: no deformity or atrophy  Skin: warm and dry, no rash Neuro:  Alert and Oriented x 3, Strength and sensation are intact Psych: euthymic mood, full affect  Wt Readings from Last 3 Encounters:  06/17/17 122 lb 3.2 oz (55.4 kg)  04/02/17 130 lb (59 kg)  03/30/17 130 lb (59 kg)      Studies/Labs Reviewed:   EKG:  EKG is ordered today.  The ekg ordered today demonstrates ***  Recent Labs: 01/29/2017: TSH 2.700 03/17/2017: Magnesium 1.6 06/17/2017: ALT 24; BUN 7; Creatinine, Ser 0.81; Hemoglobin 8.4; Platelets 226; Potassium 3.5; Sodium 135   Lipid Panel    Component Value Date/Time   CHOL 84 06/17/2017 0559   TRIG 38 06/17/2017 0559   HDL 33 (L) 06/17/2017 0559   CHOLHDL 2.5 06/17/2017 0559   VLDL 8 06/17/2017 0559   LDLCALC 43 06/17/2017 0559    Additional studies/ records that were reviewed today include:   Echocardiogram: 06/17/17 Study Conclusions  - Left ventricle: The cavity size was normal. Wall thickness was   normal. Systolic function was normal. The estimated ejection   fraction was in the range of 55% to 60%. Wall motion was normal;   there were no regional wall motion abnormalities. Features are   consistent with a pseudonormal left ventricular filling pattern,   with concomitant abnormal relaxation and increased filling    pressure (grade 2 diastolic dysfunction). Doppler parameters are   consistent with high ventricular filling pressure. - Mitral valve: There was mild regurgitation. - Left atrium: The atrium was mildly dilated. - Tricuspid valve: There was moderate regurgitation. - Pulmonary arteries: Systolic pressure was mildly increased. PA   peak pressure: 41 mm Hg (S). - Pericardium, extracardiac: A trivial pericardial effusion was   identified.  Impressions:  - Normal LV systolic function; moderate diastolic dysfunction; mild   MR; mild LAE; moderate TR with mildly elevated pulmonary   pressure.  Coronary Stent Intervention  04/2015  Left Heart Cath and Coronary Angiography  Conclusion    Dist Cx lesion, 30% stenosed.  Post Atrio lesion, 50% stenosed.  Prox RCA lesion, 80% stenosed. Post intervention, there is a 0% residual stenosis.  Prox Cx lesion, 80% stenosed. Post intervention, there is a 0% residual stenosis.  The left ventricular systolic function is normal.   Preserved global LV contractility with ejection fraction of approximately 55% with suggestion of  mild posterobasal hypokinesis and mild mid posterolateral hypocontractility.  Two-vessel coronary obstructive disease with an 80% fibrotic proximal left circumflex stenosis, 30% AV groove stenosis; and 80% eccentric proximal RCA stenosis in a dominant RCA.  Normal LAD.  Successful two-vessel percutaneous coronary intervention with PTCA/ stenting of the proximal RCA using a 3.5 x85mm Synergy DES stent postdilated to 3.66 mm, with the 80% stenosis being reduced to 0%; and Angiosculpt scoring balloon of the proximal left circumflex coronary artery with ultimate stenting with a 3.012 mm Synergy DES stent postdilated to 3. 2 mm with the 80% stenosis being reduced to 0%.      Stress test 02/2017  ST segment depression was noted during stress in the II, III, aVF, V3, V4, V5 and V6 leads, and returning to baseline after 1-5  minutes of recovery. Very minimal but diffuse ST segment depressions  No T wave inversion was noted during stress.  The study is normal.  This is a low risk study.  The left ventricular ejection fraction is  normal (55-65%).   Normal pharmacologic nuclear stress test with no evidence for prior infarct or ischemia.     ASSESSMENT & PLAN:    1. CAD  2. HTN  3. HLD  4. Tobacco abuse  5. Orthostatic mediated syncope 06/17/17  Medication Adjustments/Labs and Tests Ordered: Current medicines are reviewed at length with the patient today.  Concerns regarding medicines are outlined above.  Medication changes, Labs and Tests ordered today are listed in the Patient Instructions below. There are no Patient Instructions on file for this visit.   Lorelei Pont, [place]  07/22/2017 1:49 PM    [facility] [address] [place], [place], [place]  [address] Phone: [phone]; Fax: [phone]

## 2017-07-23 ENCOUNTER — Other Ambulatory Visit: Payer: Self-pay | Admitting: Family Medicine

## 2017-07-23 DIAGNOSIS — M069 Rheumatoid arthritis, unspecified: Secondary | ICD-10-CM

## 2017-07-26 ENCOUNTER — Telehealth: Payer: Self-pay

## 2017-07-26 NOTE — Telephone Encounter (Signed)
**Note De-Identified  Obfuscation** I did a Diovan PA over the phone with Dorene Sorrow at [facility]. Per Dorene Sorrow the Diovan PA has been approved until 07/21/2018. ID# [id number]

## 2017-07-28 ENCOUNTER — Ambulatory Visit: Payer: Medicaid Other | Admitting: Physician Assistant

## 2017-07-28 ENCOUNTER — Other Ambulatory Visit: Payer: Self-pay | Admitting: Family Medicine

## 2017-07-28 DIAGNOSIS — J449 Chronic obstructive pulmonary disease, unspecified: Secondary | ICD-10-CM

## 2017-07-29 DIAGNOSIS — J432 Centrilobular emphysema: Secondary | ICD-10-CM | POA: Insufficient documentation

## 2017-07-29 DIAGNOSIS — J849 Interstitial pulmonary disease, unspecified: Secondary | ICD-10-CM | POA: Insufficient documentation

## 2017-08-10 ENCOUNTER — Other Ambulatory Visit: Payer: Self-pay | Admitting: Physician Assistant

## 2017-08-10 DIAGNOSIS — I10 Essential (primary) hypertension: Secondary | ICD-10-CM

## 2017-08-10 DIAGNOSIS — E785 Hyperlipidemia, unspecified: Secondary | ICD-10-CM

## 2017-08-12 ENCOUNTER — Ambulatory Visit: Payer: Medicaid Other | Admitting: Cardiology

## 2017-08-12 ENCOUNTER — Encounter: Payer: Self-pay | Admitting: Cardiology

## 2017-08-12 VITALS — BP 158/90 | HR 70 | Ht 66.0 in | Wt 134.1 lb

## 2017-08-12 DIAGNOSIS — R55 Syncope and collapse: Secondary | ICD-10-CM | POA: Diagnosis not present

## 2017-08-12 DIAGNOSIS — I2510 Atherosclerotic heart disease of native coronary artery without angina pectoris: Secondary | ICD-10-CM | POA: Diagnosis not present

## 2017-08-12 NOTE — Progress Notes (Signed)
08/12/2017 Jocelyn Shelton   Dec 18, 1955  [id number]  Primary Physician Wendee Beavers, DO Primary Cardiologist: Dr. Tenny Craw  Reason for Visit/CC: [facility] F/u for CP and Syncope  HPI:  Tenika SHELBEY SPINDLER is a 62 y.o. female who is being seen today for post hospital f/u after recent presentation for chest pain and syncope.   Pt is followed by Dr. Tenny Craw and has a h/o CAD s/p NSTEMI in 2016 treated with PCI + DES to the RCA and LCx. LVEF was normal. She had repeat ischemic evaluation recently, 02/2017, and underwent a NST that was negative for ischemia. Additional PMH includes scleroderma, RA, HTN, anemia and ILD.  Pt recently presented to the ED with complaint of CP, not c/w cardiac etiology. The pain was worse with coughing and palpation of chest wall. D-dimer was elevated, however chest CT negative for PE. Troponins were negative x 3. Given pain was not consistent with cardiac etiology, no further testing was recommended.   Her syncope was felt to be orthostatic mediated, due to decreased PO intake. Her event occurred immediately after standing.  Echo was obtained and showed normal EF. No significant valvular abnormalities. Pt treated with IVFs. She was instructed to increase her PO intake. Her HCTZ dose was also reduced to half. Pt was seen by Dr. Jens Som in consultation, who felt no further inpatient w/u was needed. He did recommend considering outpatient monitor if she has future events.   Today in f/u, pt notes that she is feeling better. She is eating better. No hypotension. No recurrent syncope. She still has chest pain, when she coughs.   2D Echo 06/17/17  Study Conclusions  - Left ventricle: The cavity size was normal. Wall thickness was   normal. Systolic function was normal. The estimated ejection   fraction was in the range of 55% to 60%. Wall motion was normal;   there were no regional wall motion abnormalities. Features are   consistent with a pseudonormal left ventricular  filling pattern,   with concomitant abnormal relaxation and increased filling   pressure (grade 2 diastolic dysfunction). Doppler parameters are   consistent with high ventricular filling pressure. - Mitral valve: There was mild regurgitation. - Left atrium: The atrium was mildly dilated. - Tricuspid valve: There was moderate regurgitation. - Pulmonary arteries: Systolic pressure was mildly increased. PA   peak pressure: 41 mm Hg (S). - Pericardium, extracardiac: A trivial pericardial effusion was   identified.  Impressions:  - Normal LV systolic function; moderate diastolic dysfunction; mild   MR; mild LAE; moderate TR with mildly elevated pulmonary   pressure.   Current Meds  Medication Sig  . acetaminophen (TYLENOL) 500 MG tablet Take 1,000 mg by mouth daily as needed for pain.   Marland Kitchen albuterol (PROVENTIL HFA) 108 (90 Base) MCG/ACT inhaler INHALE TWO PUFFS BY MOUTH EVERY 4 HOURS AS NEEDED FOR WHEEZING FOR COUGH FOR SHORTNESS OF BREATH  . aspirin EC 81 MG EC tablet Take 1 tablet (81 mg total) by mouth daily.  Marland Kitchen atorvastatin (LIPITOR) 40 MG tablet TAKE 1 TABLET BY MOUTH EVERY DAY AT 6PM  . ATROVENT HFA 17 MCG/ACT inhaler TAKE 2 PUFFS EVERY DAY AS NEEDED FOR WHEEZING  . Black Cohosh 40 MG CAPS Take 1 capsule by mouth 2 (two) times daily.  . Calcium Carb-Cholecalciferol (CALCIUM 600+D3 PO) Take 1 tablet by mouth daily.  . clopidogrel (PLAVIX) 75 MG tablet Take 1 tablet (75 mg total) by mouth daily. DO NOT START UNTIL YOU FINISH  YOUR EFFIENT  . DIOVAN 80 MG tablet TAKE 1 TABLET BY MOUTH EVERY DAY  . dorzolamide-timolol (COSOPT) 22.3-6.8 MG/ML ophthalmic solution Place 1 drop into both eyes 2 (two) times daily.  . famotidine (PEPCID) 20 MG tablet Take 1 tablet (20 mg total) by mouth at bedtime.  . feeding supplement, ENSURE ENLIVE, (ENSURE ENLIVE) LIQD Take 237 mLs by mouth 2 (two) times daily between meals.  . fluticasone (FLONASE) 50 MCG/ACT nasal spray USE TWO SPRAY(S) IN EACH NOSTRIL  DAILY AS NEEDED FOR  RHINITIS  . folic acid (FOLVITE) 1 MG tablet TAKE 1 TABLET (1 MG TOTAL) BY MOUTH DAILY.  . hydrochlorothiazide (HYDRODIURIL) 25 MG tablet Take 0.5 tablets (12.5 mg total) by mouth daily.  . meloxicam (MOBIC) 15 MG tablet TAKE 1 TABLET BY MOUTH EVERY DAY  . methotrexate (RHEUMATREX) 2.5 MG tablet TAKE 4 TABLETS (10MG ) BY MOUTH 2 TIMES A WEEK EVERY MONDAY AND TUESDAY  . metoprolol tartrate (LOPRESSOR) 50 MG tablet TAKE 1 TABLET BY MOUTH TWICE A DAY  . Multiple Vitamin (MULTIVITAMIN WITH MINERALS) TABS tablet Take 1 tablet by mouth daily.  . nitroGLYCERIN (NITROSTAT) 0.4 MG SL tablet PLACE 1 TABLET UNDER THE TONGUE EVERY 5 MINUTES AS NEEDED FOR CHEST PAIN  . OLANZapine (ZYPREXA) 5 MG tablet Take 5 mg by mouth at bedtime.  . pantoprazole (PROTONIX) 40 MG tablet Take 1 tablet (40 mg total) by mouth daily.  Marland Kitchen sulfaSALAzine (AZULFIDINE) 500 MG tablet Take 2 tablets (1,000 mg total) by mouth 2 (two) times daily.  . [DISCONTINUED] nitroGLYCERIN (NITROSTAT) 0.4 MG SL tablet Place 0.4 mg under the tongue every 5 (five) minutes as needed for chest pain.   Allergies  Allergen Reactions  . Ticagrelor Shortness Of Breath  . Cymbalta [Duloxetine Hcl] Diarrhea and Nausea Only    No appetite, stomach pain  . Olanzapine Other (See Comments)    Interfered with Blood pressure medications  . Aspirin Other (See Comments)    Stomach cramps  . Lisinopril Cough  . Tomato Rash   Past Medical History:  Diagnosis Date  . Avascular necrosis of humeral head, left (HCC) 06/17/2017  . Bronchiectasis (HCC) 06/17/2017  . CAD in native artery 05/23/2015  . High cholesterol   . Hypertension   . Leg pain    ABIs 1/18: R 1.2, L 1.1  . Myocardial infarction (HCC) 04/2015  . Reflux   . Rheumatoid arthritis (HCC)    "all over" (06/16/2017)  . Scleroderma (HCC)    Family History  Problem Relation Age of Onset  . Hypertension Father   . Heart attack Mother   . Diabetes Daughter   . Diabetes  Sister   . Lung cancer Brother 74   Past Surgical History:  Procedure Laterality Date  . APPENDECTOMY  90's  . CARDIAC CATHETERIZATION N/A 05/22/2015   Procedure: Left Heart Cath and Coronary Angiography;  Surgeon: Lennette Bihari, MD;  Location: [facility] INVASIVE CV LAB;  Service: Cardiovascular;  Laterality: N/A;  . CARDIAC CATHETERIZATION N/A 05/22/2015   Procedure: Coronary Stent Intervention;  Surgeon: Lennette Bihari, MD;  Location: [facility] INVASIVE CV LAB;  Service: Cardiovascular;  Laterality: N/A;   Social History   Socioeconomic History  . Marital status: Divorced    Spouse name: Not on file  . Number of children: 1  . Years of education: Not on file  . Highest education level: Not on file  Social Needs  . Financial resource strain: Not on file  . Food insecurity - worry: Not  on file  . Food insecurity - inability: Not on file  . Transportation needs - medical: Not on file  . Transportation needs - non-medical: Not on file  Occupational History  . Occupation: Retired  Tobacco Use  . Smoking status: Current Every Day Smoker    Packs/day: 0.33    Years: 46.00    Pack years: 15.18    Types: Cigarettes  . Smokeless tobacco: Never Used  Substance and Sexual Activity  . Alcohol use: Yes    Alcohol/week: 2.4 oz    Types: 4 Standard drinks or equivalent per week  . Drug use: No  . Sexual activity: No  Other Topics Concern  . Not on file  Social History Narrative  . Not on file     Review of Systems: General: negative for chills, fever, night sweats or weight changes.  Cardiovascular: negative for chest pain, dyspnea on exertion, edema, orthopnea, palpitations, paroxysmal nocturnal dyspnea or shortness of breath Dermatological: negative for rash Respiratory: negative for cough or wheezing Urologic: negative for hematuria Abdominal: negative for nausea, vomiting, diarrhea, bright red blood per rectum, melena, or hematemesis Neurologic: negative for visual changes, syncope, or  dizziness All other systems reviewed and are otherwise negative except as noted above.   Physical Exam:  Blood pressure (!) 158/90, pulse 70, height 5\' 6"  (1.676 m), weight 134 lb 1.9 oz (60.8 kg), SpO2 95 %.  General appearance: alert, cooperative and no distress, thin appearing Neck: no carotid bruit and no JVD Lungs: clear to auscultation bilaterally Heart: regular rate and rhythm, S1, S2 normal, no murmur, click, rub or gallop Extremities: extremities normal, atraumatic, no cyanosis or edema Pulses: 2+ and symmetric Skin: Skin color, texture, turgor normal. No rashes or lesions Neurologic: Grossly normal  EKG not performed -- personally reviewed   ASSESSMENT AND PLAN:   1. Musculoskeletal CP: recent CP not c/w cardiac etiology. Worse with cough and palpation of chest wall. Cardiac enzymes negative x 3. 2D echo with normal LVEF. W/u negative for PE. Advised pt use Tylenol PRN.   2. Syncope: occurred in the setting of orthostatic hypotension, due to decreased PO intake. Home HCTZ dose reduced to 12.5. Stable. No recurrent symptoms.   3. CAD: h/o MI in 2016 with DES to RCA and LCx. No recent ischemic CP. NST 02/2017 negative for ischemia.  Continue medical therapy for CAD.   4. HTN: moderately elevated this morning at 158/90. However pt has not yet taken morning meds.   5. Scleroderma:  Followed by specialist in [place].   6. RA: followed by specialist in [place]  7. Anemia: pt on ASA and Plavix for CAD. Gradual drop in Hgb over the last several months from 11>>10>>9>>8.4 (hospital discharge). Will check repeat CBC today.    Follow-Up s/ Dr. 03/2017 in 6 months  Evalynn Hankins Tenny Craw, MHS [facility] 08/12/2017 8:35 AM

## 2017-08-12 NOTE — Patient Instructions (Addendum)
Medication Instructions:  Your physician recommends that you continue on your current medications as directed. Please refer to the Current Medication list given to you today.   Labwork: TODAY:  CBC  Testing/Procedures: None ordered  Follow-Up: Your physician wants you to follow-up in: 6 MONTHS WITH DR. ROSS   You will receive a reminder letter in the mail two months in advance. If you don't receive a letter, please call our office to schedule the follow-up appointment.    Any Other Special Instructions Will Be Listed Below (If Applicable). Please call your primary care physician to make an appt for Anemia work-up.    If you need a refill on your cardiac medications before your next appointment, please call your pharmacy.

## 2017-08-13 LAB — CBC
Hematocrit: 30.9 % — ABNORMAL LOW (ref 34.0–46.6)
Hemoglobin: 10.3 g/dL — ABNORMAL LOW (ref 11.1–15.9)
MCH: 32 pg (ref 26.6–33.0)
MCHC: 33.3 g/dL (ref 31.5–35.7)
MCV: 96 fL (ref 79–97)
Platelets: 247 10*3/uL (ref 150–379)
RBC: 3.22 x10E6/uL — AB (ref 3.77–5.28)
RDW: 14.6 % (ref 12.3–15.4)
WBC: 4.7 10*3/uL (ref 3.4–10.8)

## 2017-08-24 ENCOUNTER — Other Ambulatory Visit: Payer: Self-pay | Admitting: Family Medicine

## 2017-08-24 DIAGNOSIS — M069 Rheumatoid arthritis, unspecified: Secondary | ICD-10-CM

## 2017-08-30 ENCOUNTER — Other Ambulatory Visit: Payer: Self-pay

## 2017-08-30 DIAGNOSIS — M069 Rheumatoid arthritis, unspecified: Secondary | ICD-10-CM

## 2017-08-30 IMAGING — CT CT ANGIO CHEST
1 of 8 series · 17 of 36 positions shown · IV contrast (Iohexol (Omnipaque 350))
Comparison: Plain film of earlier today.  CT of 08/01/2015.

CLINICAL DATA: Cough. Left-sided chest soreness. Symptoms for 1
month. Scleroderma and rheumatoid arthritis.

EXAM:
CT ANGIOGRAPHY CHEST WITH CONTRAST
TECHNIQUE: Multidetector CT imaging of the chest was performed using the
standard protocol during bolus administration of intravenous
contrast. Multiplanar CT image reconstructions and MIPs were
obtained to evaluate the vascular anatomy.
CONTRAST:  100 cc of Isovue 370

[Series 406: thins pacs · axial · 0.68mm/px · z∈[+102,+358]mm · 17 of 290 slices shown]
[im 17/290  lung]
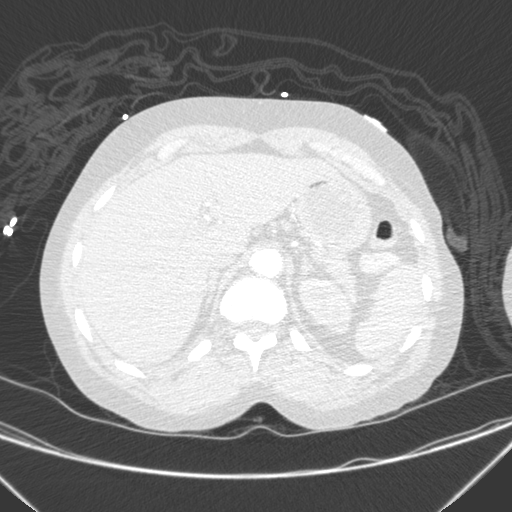
[im 33/290  mediastinal]
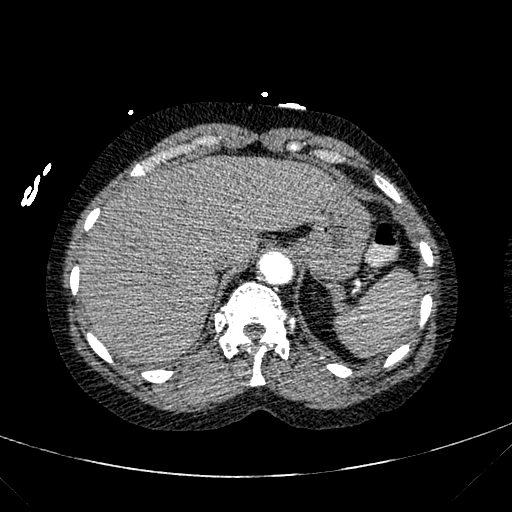
[im 49/290  lung]
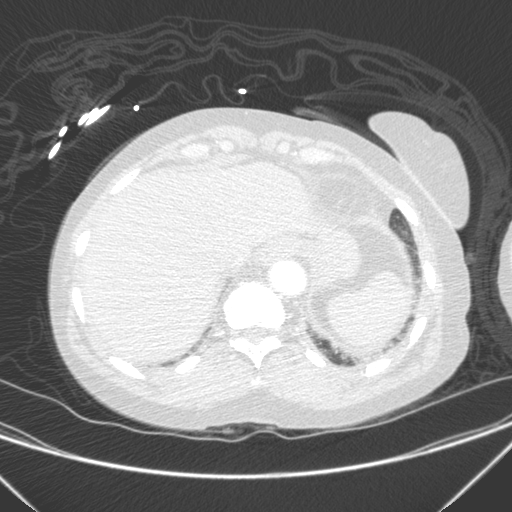
[im 65/290  mediastinal]
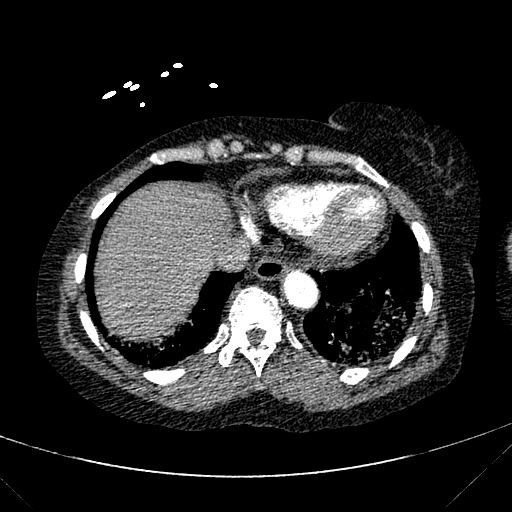
[im 81/290  lung]
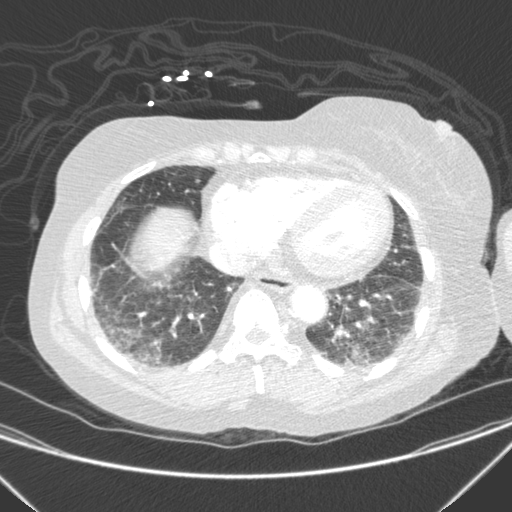
[im 97/290  mediastinal]
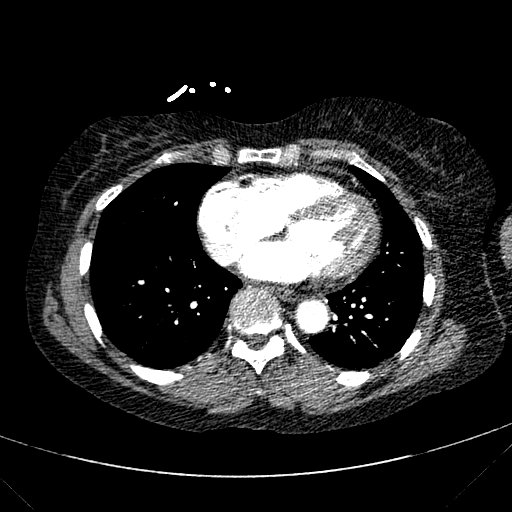
[im 113/290  lung]
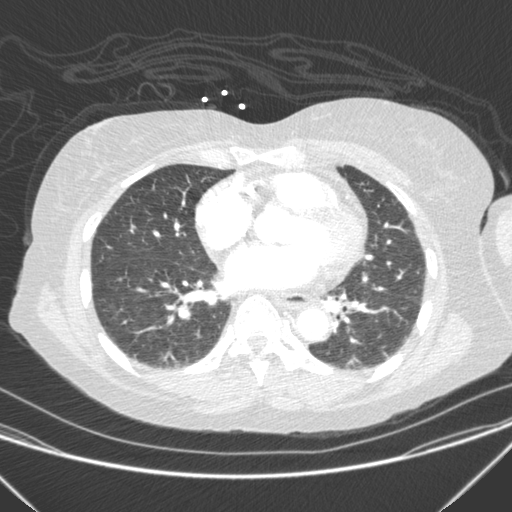
[im 129/290  mediastinal]
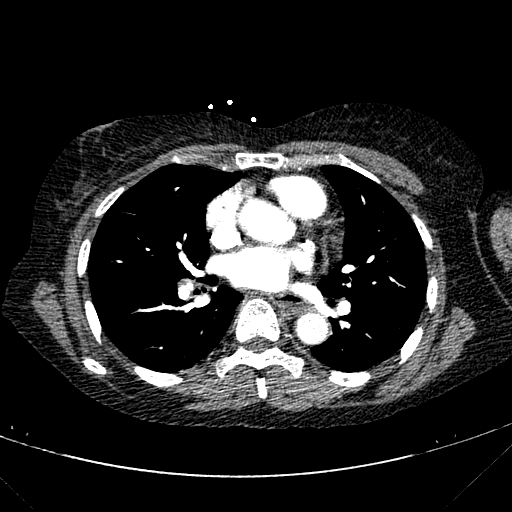
[im 145/290  lung]
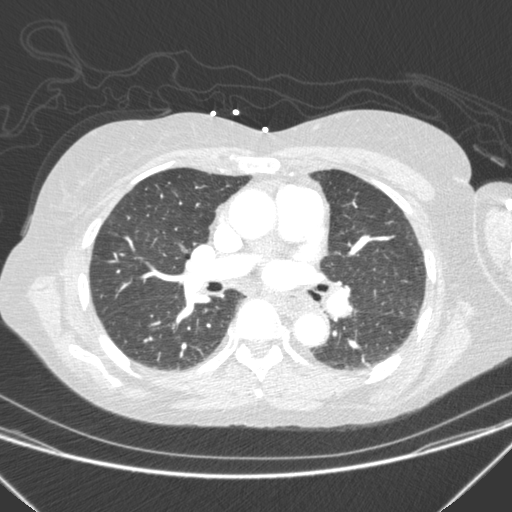
[im 161/290  mediastinal]
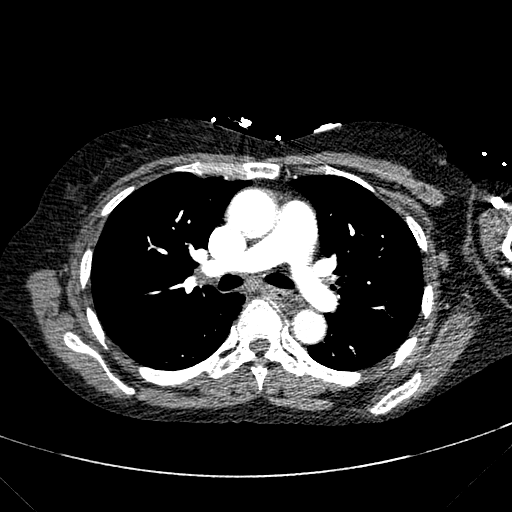
[im 177/290  lung]
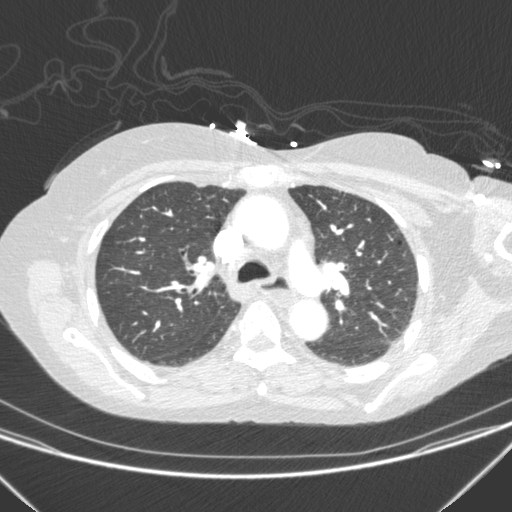
[im 193/290  mediastinal]
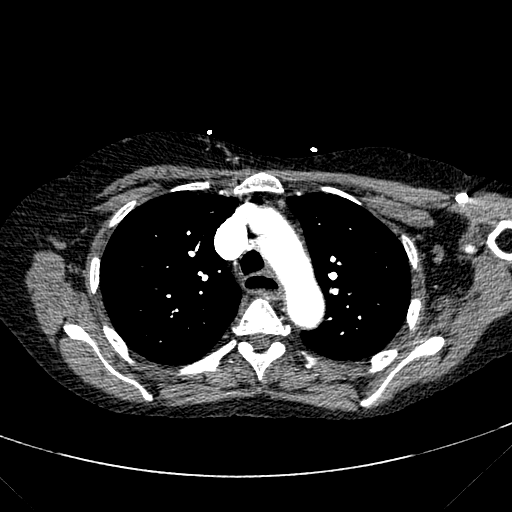
[im 209/290  lung]
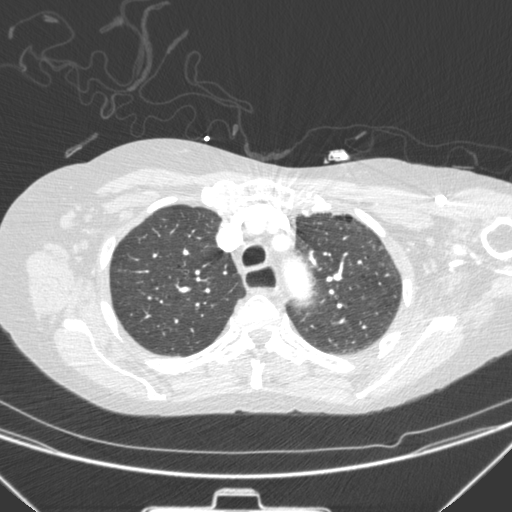
[im 225/290  mediastinal]
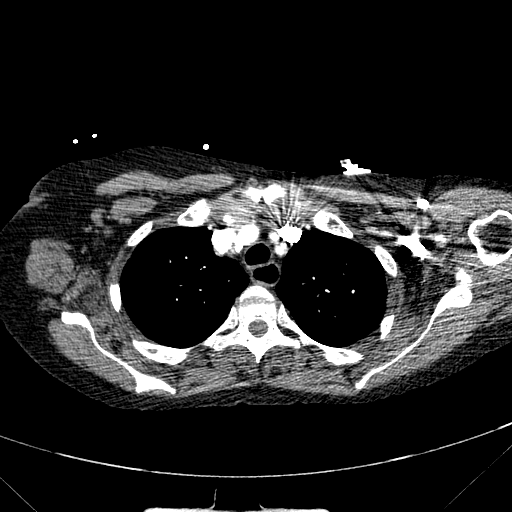
[im 241/290  lung]
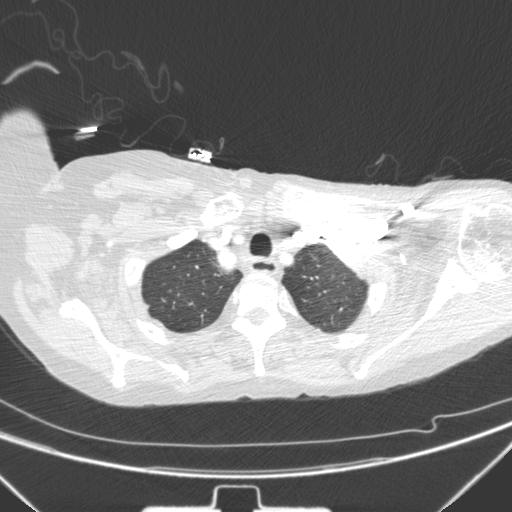
[im 257/290  mediastinal]
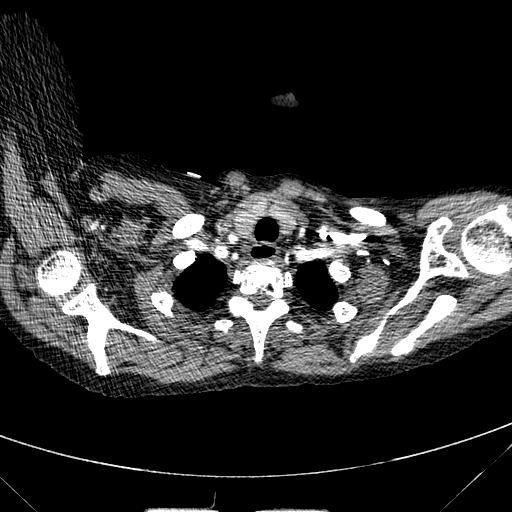
[im 273/290  lung]
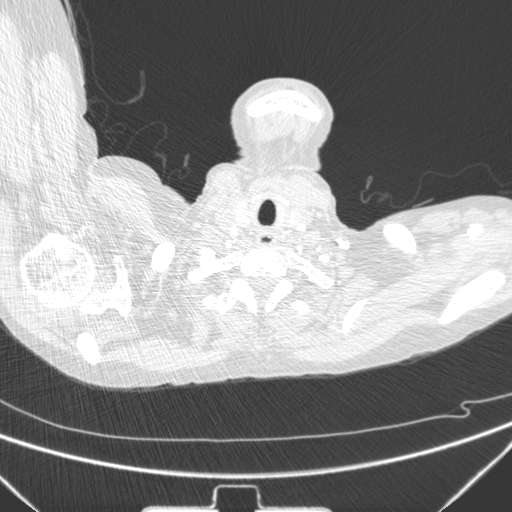

[17 of 36 positions shown; findings below may reference images not displayed]

FINDINGS: Mediastinum/Nodes: The quality of this exam for evaluation of
pulmonary embolism is good. No evidence of pulmonary embolism.

Multiple small bilateral axillary nodes have been present back to
02/09/2015 are likely reactive. Aortic and branch vessel
atherosclerosis. Tortuous thoracic aorta. Mild cardiomegaly,
accentuated by a mild pectus excavatum deformity. Multivessel
coronary artery atherosclerosis. No mediastinal or hilar adenopathy.
Moderately dilated thoracic esophagus with fluid level within.
Similar.

Lungs/Pleura: No pleural fluid. Mild paraseptal emphysema.
Redemonstration of interstitial coarsening at the lung bases,
slightly greater on the left. Similar. No airspace consolidation.

Upper abdomen: Normal imaged portions of the liver, spleen, stomach,
pancreas, adrenal glands, left kidney.

Musculoskeletal: No acute osseous abnormality.

Review of the MIP images confirms the above findings.
IMPRESSION: 1.  No evidence of pulmonary embolism.
2.  Atherosclerosis, including within the coronary arteries.
3. Re- demonstration of a dilated esophagus with fluid level within.
Likely related to the clinical history of scleroderma.
4. Suspicion of mild interstitial lung disease, possibly early usual
interstitial pneumonitis. Similar.

## 2017-09-01 NOTE — Telephone Encounter (Signed)
Called patient and informed her that she needs to get her RX for Azulefidine filled by her rheumatologist per instruction from PCP.Glennie Hawk, CMA

## 2017-09-10 NOTE — Progress Notes (Signed)
   Subjective   Patient ID: Jocelyn Shelton    DOB: 1955/08/20, 62 y.o. female   MRN: [id number]  CC: "Iron labs"  HPI: Jocelyn Shelton is a 62 y.o. female who presents to clinic today for the following:  Anemia: Patient instructed to follow-up here for checkup of chronic anemia.  Last hemoglobin improved to 10.3 as of 1 month ago compared to baseline 9.0.  Currently on Plavix and aspirin for stent followed by cardiology.  Currently receiving chronic DMARD's with rheumatology for RA and scleroderma.  Patient denies fatigue, shortness of breath, chest pain, syncope, vaginal bleeding.  Smoking: Currently smoking 5 cigarettes/day.  Has been smoking for 45 years with 1/4 packs/day.  No prior attempts to stop smoking.  Interested in cessation today.  Denies fevers or chills, cough, change in weight, shortness of breath, chest pain.  Hypertension: Blood pressure elevated today.  Patient did not take her HCTZ this morning.  She is interested in smoking cessation today.  ROS: see HPI for pertinent.  PMFSH: RA+scleroderma, COPD, PVD with claudication and h/o NSTEMI, HTN, MDD. Surgical historyLHC 04/2015 with stent placement, appendectomy. Family history MI, HTN, DM, lung cancer (borther age 15). Smoking status reviewed. Medications reviewed.  Objective   BP (!) 150/78   Pulse 80   Temp 98.4 F (36.9 C) (Oral)   Ht 5\' 6"  (1.676 m)   Wt 130 lb 3.2 oz (59.1 kg)   SpO2 98%   BMI 21.01 kg/m  Vitals and nursing note reviewed.  General: frail elderly woman with cane, NAD with non-toxic appearance HEENT: normocephalic, atraumatic, moist mucous membranes, pale conjuctiva Neck: supple, non-tender without lymphadenopathy Cardiovascular: regular rate and rhythm without murmurs, rubs, or gallops Lungs: clear to auscultation bilaterally with normal work of breathing Abdomen: soft, non-tender, non-distended, normoactive bowel sounds Skin: warm, dry, no rashes or lesions, cap refill < 2  seconds Extremities: warm and well perfused, normal tone, no edema, diffuse joint enlargement consistent mainly on MCP joints of hands bilaterally  Assessment & Plan   Primary hypertension Chronic.  Uncontrolled this morning due to patient not taking antihypertensives.  Current everyday smoker. - Continue HCTZ 12.5 mg daily, Lopressor 50 mg twice daily, and Diovan 80 mg daily - Discussed smoking cessation today  Other pancytopenia (HCC) Chronic.  From baseline as of 1 month ago.  Pancytopenia likely from chronic DMARD use for RA and scleroderma.  Here today for anemia evaluation per cardiology who is following patient for history of stents.  Patient with a history of acute blood loss. - Iron and TIBC, ferritin  Tobacco use disorder Chronic.  Current everyday smoker with 1/4 pack/day.  Interested in cessation today.  Quit date set 10/18/17. - NicoDerm CQ 7 mg / 24-hour patch with weekly cigarette wean - RTC 1 month  Orders Placed This Encounter  Procedures  . Iron and TIBC  . Ferritin   Meds ordered this encounter  Medications  . nicotine (NICODERM CQ - DOSED IN MG/24 HR) 7 mg/24hr patch    Sig: Place 1 patch (7 mg total) onto the skin daily.    Dispense:  28 patch    Refill:  0    10/20/17, DO [facility], PGY-2 09/13/2017, 10:16 AM

## 2017-09-13 ENCOUNTER — Other Ambulatory Visit: Payer: Self-pay

## 2017-09-13 ENCOUNTER — Ambulatory Visit: Payer: Medicaid Other | Admitting: Family Medicine

## 2017-09-13 ENCOUNTER — Encounter: Payer: Self-pay | Admitting: Family Medicine

## 2017-09-13 VITALS — BP 150/78 | HR 80 | Temp 98.4°F | Ht 66.0 in | Wt 130.2 lb

## 2017-09-13 DIAGNOSIS — F17200 Nicotine dependence, unspecified, uncomplicated: Secondary | ICD-10-CM | POA: Diagnosis not present

## 2017-09-13 DIAGNOSIS — D61818 Other pancytopenia: Secondary | ICD-10-CM

## 2017-09-13 DIAGNOSIS — I10 Essential (primary) hypertension: Secondary | ICD-10-CM | POA: Diagnosis not present

## 2017-09-13 MED ORDER — NICOTINE 7 MG/24HR TD PT24: 7.0000 mg | MEDICATED_PATCH | Freq: Every day | TRANSDERMAL | 0 refills | Status: DC

## 2017-09-13 NOTE — Assessment & Plan Note (Addendum)
Chronic.  Uncontrolled this morning due to patient not taking antihypertensives.  Current everyday smoker. - Continue HCTZ 12.5 mg daily, Lopressor 50 mg twice daily, and Diovan 80 mg daily - Discussed smoking cessation today

## 2017-09-13 NOTE — Patient Instructions (Signed)
Thank you for coming in to see [place] today. Please see below to review our plan for today's visit.  1.  I am proud of you for wanting to stop smoking today!  Placed the nicotine patch on your shoulder once daily to prevent cravings.  You can continue smoking 5 cigarettes/day this week and then decrease by 1 cigarette/week on every Monday.  Your goal is to be off of cigarettes by 10/18/2017.  I would like to see you in 1 month to see how you are doing with your smoking cessation.  In the meantime you can call 1-800-QUIT-NOW for free resources in addition to the nicotine patch I provided you. 2.  I have checked your blood levels and will call you if there is any abnormality.  Please call the clinic at [phone] if your symptoms worsen or you have any concerns. It was our pleasure to serve you.  Durward Parcel, DO [facility], PGY-2   Steps to Quit Smoking Smoking tobacco can be bad for your health. It can also affect almost every organ in your body. Smoking puts you and people around you at risk for many serious long-lasting (chronic) diseases. Quitting smoking is hard, but it is one of the best things that you can do for your health. It is never too late to quit. What are the benefits of quitting smoking? When you quit smoking, you lower your risk for getting serious diseases and conditions. They can include:  Lung cancer or lung disease.  Heart disease.  Stroke.  Heart attack.  Not being able to have children (infertility).  Weak bones (osteoporosis) and broken bones (fractures).  If you have coughing, wheezing, and shortness of breath, those symptoms may get better when you quit. You may also get sick less often. If you are pregnant, quitting smoking can help to lower your chances of having a baby of low birth weight. What can I do to help me quit smoking? Talk with your doctor about what can help you quit smoking. Some things you can do (strategies)  include:  Quitting smoking totally, instead of slowly cutting back how much you smoke over a period of time.  Going to in-person counseling. You are more likely to quit if you go to many counseling sessions.  Using resources and support systems, such as: ? Agricultural engineer with a Veterinary surgeon. ? Phone quitlines. ? Automotive engineer. ? Support groups or group counseling. ? Text messaging programs. ? Mobile phone apps or applications.  Taking medicines. Some of these medicines may have nicotine in them. If you are pregnant or breastfeeding, do not take any medicines to quit smoking unless your doctor says it is okay. Talk with your doctor about counseling or other things that can help you.  Talk with your doctor about using more than one strategy at the same time, such as taking medicines while you are also going to in-person counseling. This can help make quitting easier. What things can I do to make it easier to quit? Quitting smoking might feel very hard at first, but there is a lot that you can do to make it easier. Take these steps:  Talk to your family and friends. Ask them to support and encourage you.  Call phone quitlines, reach out to support groups, or work with a Veterinary surgeon.  Ask people who smoke to not smoke around you.  Avoid places that make you want (trigger) to smoke, such as: ? Bars. ? Parties. ? Smoke-break areas  at work.  Spend time with people who do not smoke.  Lower the stress in your life. Stress can make you want to smoke. Try these things to help your stress: ? Getting regular exercise. ? Deep-breathing exercises. ? Yoga. ? Meditating. ? Doing a body scan. To do this, close your eyes, focus on one area of your body at a time from head to toe, and notice which parts of your body are tense. Try to relax the muscles in those areas.  Download or buy apps on your mobile phone or tablet that can help you stick to your quit plan. There are many free apps,  such as QuitGuide from the [employer] developer for Disease Control and Prevention). You can find more support from smokefree.gov and other websites.  This information is not intended to replace advice given to you by your health care provider. Make sure you discuss any questions you have with your health care provider. Document Released: 05/09/2009 Document Revised: 03/10/2016 Document Reviewed: 11/27/2014 Elsevier Interactive Patient Education  2018 [employer].

## 2017-09-13 NOTE — Assessment & Plan Note (Addendum)
Chronic.  From baseline as of 1 month ago.  Pancytopenia likely from chronic DMARD use for RA and scleroderma.  Here today for anemia evaluation per cardiology who is following patient for history of stents.  Patient with a history of acute blood loss. - Iron and TIBC, ferritin

## 2017-09-13 NOTE — Assessment & Plan Note (Addendum)
Chronic.  Current everyday smoker with 1/4 pack/day.  Interested in cessation today.  Quit date set 10/18/17. - NicoDerm CQ 7 mg / 24-hour patch with weekly cigarette wean - RTC 1 month

## 2017-09-14 LAB — FERRITIN: Ferritin: 214 ng/mL — ABNORMAL HIGH (ref 15–150)

## 2017-09-14 LAB — IRON AND TIBC
IRON: 43 ug/dL (ref 27–139)
Iron Saturation: 16 % (ref 15–55)
Total Iron Binding Capacity: 270 ug/dL (ref 250–450)
UIBC: 227 ug/dL (ref 118–369)

## 2017-09-15 ENCOUNTER — Encounter: Payer: Self-pay | Admitting: Family Medicine

## 2017-10-21 ENCOUNTER — Other Ambulatory Visit: Payer: Self-pay | Admitting: Family Medicine

## 2017-12-22 ENCOUNTER — Other Ambulatory Visit: Payer: Self-pay | Admitting: Family Medicine

## 2017-12-22 DIAGNOSIS — I10 Essential (primary) hypertension: Secondary | ICD-10-CM

## 2017-12-23 ENCOUNTER — Telehealth: Payer: Self-pay

## 2017-12-23 NOTE — Telephone Encounter (Signed)
Prior approval for Olanzapine completed via [employer]. Med approved for 12/23/17 - 12/23/18. Prior approval # [id number]. [facility] informed.  Ples Specter, RN [facility] RN)

## 2018-01-05 NOTE — Progress Notes (Signed)
   Subjective   Patient ID: Jocelyn Shelton    DOB: 1956/05/28, 62 y.o. female   MRN: [id number]  CC: "Pain"  HPI: Jocelyn Shelton is a 62 y.o. female who presents for a same day appointment for the following:  Foot pain: Patient has pain particularly around the right foot.  She has known neuropathy and endorses loss of sensation to her toes.  Patient also endorses significant pain with palpation of her right foot.  She has noticed some growths on her medial surface of her right big toe and the lateral surface of her right pinky toe.  She was previously seen by a podiatrist back in the 90s but has not seen anybody since.  She tries to wear comfortable shoes but does not wear any orthotics or foot support.  She denies any fevers or chills, nausea or vomiting, loss of motor function.  ROS: see HPI for pertinent.  PMFSH: RA, scleroderma, COPD, PVD with claudicationand h/o NSTEMI, HTN, MDD. Surgical historyLHC 04/2015 with stent placement, appendectomy. Family history MI, HTN, DM, lung cancer (borther age 62). Smoking status reviewed. Medications reviewed.  Objective   BP (!) 162/80   Pulse 88   Temp 98.2 F (36.8 C) (Oral)   Ht 5\' 6"  (1.676 m)   Wt 139 lb 3.2 oz (63.1 kg)   SpO2 99%   BMI 22.47 kg/m  Vitals and nursing note reviewed.  General: frail elderly woman, NAD with non-toxic appearance HEENT: normocephalic, atraumatic, moist mucous membranes Cardiovascular: regular rate and rhythm without murmurs, rubs, or gallops Lungs: clear to auscultation bilaterally with normal work of breathing Skin: warm, dry, cap refill < 2 seconds, small callus of right big and little toe measuring 0.5 mm x 0.5 mm and 0.25 x 0.25 mm respectively, moderate tenderness upon palpation, no erythema or fluctuance or discharge Extremities: warm and well perfused, normal tone, no edema  Assessment & Plan   Callus of foot Acute on chronic.  Likely secondary to neuropathy.  For signs of underlying  infection. - Ambulatory referral to podiatry - Advised patient to use over-the-counter Dr. padding around pressure areas  Orders Placed This Encounter  Procedures  . Ambulatory referral to Podiatry    Referral Priority:   Routine    Referral Type:   Consultation    Referral Reason:   Specialty Services Required    Requested Specialty:   Podiatry    Number of Visits Requested:   1   No orders of the defined types were placed in this encounter.   Margart Sickles, DO [facility], PGY-2 01/06/2018, 4:30 PM

## 2018-01-06 ENCOUNTER — Other Ambulatory Visit: Payer: Self-pay

## 2018-01-06 ENCOUNTER — Ambulatory Visit: Payer: Medicaid Other | Admitting: Family Medicine

## 2018-01-06 ENCOUNTER — Encounter: Payer: Self-pay | Admitting: Family Medicine

## 2018-01-06 VITALS — BP 162/80 | HR 88 | Temp 98.2°F | Ht 66.0 in | Wt 139.2 lb

## 2018-01-06 DIAGNOSIS — L84 Corns and callosities: Secondary | ICD-10-CM

## 2018-01-06 NOTE — Patient Instructions (Signed)
Thank you for coming in to see [place] today. Please see below to review our plan for today's visit.  Your calluses are secondary to your underlying neuropathy.  These will continue to recur.  They need to be periodically shaved by a podiatrist.  I placed a referral.  You should receive a call to schedule appointment within the next week.  Please call the clinic at [phone] if your symptoms worsen or you have any concerns. It was our pleasure to serve you.  Durward Parcel, DO [facility], PGY-2

## 2018-01-06 NOTE — Assessment & Plan Note (Signed)
Acute on chronic.  Likely secondary to neuropathy.  For signs of underlying infection. - Ambulatory referral to podiatry - Advised patient to use over-the-counter Dr. Margart Sickles padding around pressure areas

## 2018-01-15 ENCOUNTER — Other Ambulatory Visit: Payer: Self-pay | Admitting: Family Medicine

## 2018-01-15 ENCOUNTER — Other Ambulatory Visit: Payer: Self-pay | Admitting: Physician Assistant

## 2018-01-15 DIAGNOSIS — I2510 Atherosclerotic heart disease of native coronary artery without angina pectoris: Secondary | ICD-10-CM

## 2018-01-19 ENCOUNTER — Other Ambulatory Visit: Payer: Self-pay | Admitting: Family Medicine

## 2018-01-19 DIAGNOSIS — Z1231 Encounter for screening mammogram for malignant neoplasm of breast: Secondary | ICD-10-CM

## 2018-01-25 ENCOUNTER — Ambulatory Visit: Payer: Medicaid Other | Admitting: Sports Medicine

## 2018-01-25 ENCOUNTER — Encounter: Payer: Self-pay | Admitting: Sports Medicine

## 2018-01-25 VITALS — BP 167/99 | HR 92 | Resp 16

## 2018-01-25 DIAGNOSIS — M79671 Pain in right foot: Secondary | ICD-10-CM

## 2018-01-25 DIAGNOSIS — Q828 Other specified congenital malformations of skin: Secondary | ICD-10-CM | POA: Diagnosis not present

## 2018-01-25 NOTE — Progress Notes (Signed)
Subjective: Jocelyn Shelton is a 62 y.o. female patient who presents to office for evaluation of Right foot pain secondary to callus skin. Patient complains of pain at the lesions present Right foot at the 1st toe and bottom of foot. Patient has tried trimming in past and vaseline with no relief in symptoms. Patient denies any other pedal complaints.   Review of Systems  Musculoskeletal: Positive for joint pain.  Skin:       Callus  All other systems reviewed and are negative.    Patient Active Problem List   Diagnosis Date Noted  . Centrilobular emphysema ([place]) 07/29/2017  . ILD (interstitial lung disease) ([place]) 07/29/2017  . Hypoalbuminemia due to protein-calorie malnutrition ([place]) 06/17/2017  . Leg pain, diffuse 06/17/2017  . Dilation of esophagus 06/17/2017  . Bronchiectasis ([place]) 06/17/2017  . Avascular necrosis of humeral head, right ([place]) 06/17/2017  . Pulmonary hypertension ([place]), Possible 06/17/2017  . Coronary artery disease involving native coronary artery of native heart without angina pectoris   . Hyponatremia 03/16/2017  . Foot ulcer, right ([place])   . Scleroderma ([place]) 03/08/2016  . Immunosuppressed status ([place])   . COPD (chronic obstructive pulmonary disease) ([place]) 08/30/2015  . Allergic rhinitis 12/03/2014  . Rheumatoid arthritis ([place]) 03/14/2013  . Other pancytopenia ([place]) 03/01/2013  . 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 Erythema nodosum 04/27/2011  . GERD (gastroesophageal reflux disease) 08/02/2008  . Callus of foot 03/18/2007  . Major depressive disorder, recurrent episode ([place]) 09/23/2006  . Tobacco use disorder 09/23/2006  . Primary hypertension 09/23/2006  . Raynauds syndrome 09/23/2006    Current Outpatient Medications on File Prior to Visit  Medication Sig Dispense Refill  . acetaminophen (TYLENOL) 500 MG tablet Take 1,000 mg by mouth daily as needed for pain.     Marland Kitchen albuterol (PROVENTIL HFA) 108 (90 Base) MCG/ACT inhaler INHALE  TWO PUFFS BY MOUTH EVERY 4 HOURS AS NEEDED FOR WHEEZING FOR COUGH FOR SHORTNESS OF BREATH 7 each 1  . aspirin EC 81 MG EC tablet Take 1 tablet (81 mg total) by mouth daily.    Marland Kitchen atorvastatin (LIPITOR) 40 MG tablet TAKE 1 TABLET BY MOUTH EVERY DAY AT 6PM 90 tablet 2  . Black Cohosh 40 MG CAPS Take 1 capsule by mouth 2 (two) times daily.    . budesonide-formoterol (SYMBICORT) 160-4.5 MCG/ACT inhaler Inhale 2 puffs into the lungs 2 (two) times daily.    . Calcium Carb-Cholecalciferol (CALCIUM 600+D3 PO) Take 1 tablet by mouth daily.    . clopidogrel (PLAVIX) 75 MG tablet Take 1 tablet (75 mg total) by mouth daily. 90 tablet 3  . DIOVAN 80 MG tablet TAKE 1 TABLET BY MOUTH EVERY DAY 30 tablet 6  . dorzolamide-timolol (COSOPT) 22.3-6.8 MG/ML ophthalmic solution Place 1 drop into both eyes 2 (two) times daily.    . famotidine (PEPCID) 20 MG tablet Take 1 tablet (20 mg total) by mouth at bedtime. 30 tablet 0  . feeding supplement, ENSURE ENLIVE, (ENSURE ENLIVE) LIQD Take 237 mLs by mouth 2 (two) times daily between meals. 237 mL 12  . fluticasone (FLONASE) 50 MCG/ACT nasal spray USE TWO SPRAY(S) IN EACH NOSTRIL DAILY AS NEEDED FOR  RHINITIS 16 g 2  . folic acid (FOLVITE) 1 MG tablet TAKE 1 TABLET (1 MG TOTAL) BY MOUTH DAILY. 30 tablet 6  . hydrochlorothiazide (HYDRODIURIL) 25 MG tablet TAKE 1 TABLET BY MOUTH EVERY DAY 30 tablet 3  . meloxicam (MOBIC) 15 MG tablet TAKE 1 TABLET BY MOUTH  EVERY DAY 30 tablet 0  . methotrexate (RHEUMATREX) 2.5 MG tablet TAKE 4 TABLETS (10MG) BY MOUTH 2 TIMES A WEEK EVERY MONDAY AND TUESDAY 32 tablet 0  . metoprolol tartrate (LOPRESSOR) 50 MG tablet TAKE 1 TABLET BY MOUTH TWICE A DAY 180 tablet 2  . Multiple Vitamin (MULTIVITAMIN WITH MINERALS) TABS tablet Take 1 tablet by mouth daily.    . nicotine (NICODERM CQ - DOSED IN MG/24 HR) 7 mg/24hr patch Place 1 patch (7 mg total) onto the skin daily. 28 patch 0  . nitroGLYCERIN (NITROSTAT) 0.4 MG SL tablet PLACE 1 TABLET UNDER THE  TONGUE EVERY 5 MINUTES AS NEEDED FOR CHEST PAIN 25 tablet 5  . OLANZapine (ZYPREXA) 5 MG tablet TAKE 1 TABLET BY MOUTH EVERYDAY AT BEDTIME 90 tablet 3  . pantoprazole (PROTONIX) 40 MG tablet Take 1 tablet (40 mg total) by mouth daily. 90 tablet 3  . sulfaSALAzine (AZULFIDINE) 500 MG tablet Take 2 tablets (1,000 mg total) by mouth 2 (two) times daily. 120 tablet 0   No current facility-administered medications on file prior to visit.     Allergies  Allergen Reactions  . Ticagrelor Shortness Of Breath  . Cymbalta [Duloxetine Hcl] Diarrhea and Nausea Only    No appetite, stomach pain  . Olanzapine Other (See Comments)    Interfered with Blood pressure medications  . Aspirin Other (See Comments)    Stomach cramps  . Lisinopril Cough  . Tomato Rash    Objective:  General: Alert and oriented x3 in no acute distress  Dermatology: Keratotic lesion present sub met 5 and medial great toe on right with skin lines transversing the lesion, pain is present with direct pressure to the lesion with a central nucleated core noted, no webspace macerations, no ecchymosis bilateral, all nails x 10 are well manicured.  Vascular: Dorsalis Pedis and Posterior Tibial pedal pulses 1/4, Capillary Fill Time 5 seconds, no pedal hair growth bilateral, no edema bilateral lower extremities, Temperature gradient within normal limits.  Neurology: Johney Maine sensation intact via light touch bilateral.  Musculoskeletal: Mild tenderness with palpation at the keratotic lesion sites on Right, Muscular strength 5/5 in all groups without pain or limitation on range of motion. No lower extremity muscular or boney deformity noted.  Assessment and Plan: Problem List Items Addressed This Visit    None    Visit Diagnoses    Porokeratosis    -  Primary   Right foot pain          -Complete examination performed -Discussed treatment options -Parred keratoic lesions x2 using a chisel; treated the area withSalinocaine covered  with bandaid -Encouraged daily skin emollients -Encouraged use of pumice stone -Advised good supportive shoes and inserts -Patient to return to office in 3 months or as needed or sooner if condition worsens.  Landis Martins, DPM

## 2018-02-04 DIAGNOSIS — F17210 Nicotine dependence, cigarettes, uncomplicated: Secondary | ICD-10-CM | POA: Insufficient documentation

## 2018-02-15 ENCOUNTER — Ambulatory Visit
Admission: RE | Admit: 2018-02-15 | Discharge: 2018-02-15 | Disposition: A | Discharge status: Home / Self Care | Payer: Medicaid Other | Source: Ambulatory Visit | Attending: Family Medicine | Admitting: Family Medicine

## 2018-02-15 DIAGNOSIS — Z1231 Encounter for screening mammogram for malignant neoplasm of breast: Secondary | ICD-10-CM

## 2018-03-09 ENCOUNTER — Other Ambulatory Visit: Payer: Self-pay

## 2018-03-09 ENCOUNTER — Telehealth: Payer: Self-pay | Admitting: Internal Medicine

## 2018-03-09 MED ORDER — DIOVAN 80 MG PO TABS: 80.0000 mg | ORAL_TABLET | Freq: Every day | ORAL | 1 refills | Status: DC

## 2018-03-09 NOTE — Telephone Encounter (Signed)
New Message         *STAT* If patient is at the pharmacy, call can be transferred to refill team.   1. Which medications need to be refilled? (please list name of each medication and dose if known) Diovan  2. Which pharmacy/location (including street and city if local pharmacy) is medication to be sent to? [facility] [address]  3. Do they need a 30 day or 90 day supply? 90

## 2018-03-27 ENCOUNTER — Emergency Department (HOSPITAL_COMMUNITY): Payer: Medicaid Other

## 2018-03-27 ENCOUNTER — Encounter (HOSPITAL_COMMUNITY): Payer: Self-pay | Admitting: Emergency Medicine

## 2018-03-27 ENCOUNTER — Emergency Department (HOSPITAL_COMMUNITY)
Admission: EM | Admit: 2018-03-27 | Discharge: 2018-03-27 | Disposition: A | Discharge status: Home / Self Care | Payer: Medicaid Other | Attending: Emergency Medicine | Admitting: Emergency Medicine

## 2018-03-27 DIAGNOSIS — F17210 Nicotine dependence, cigarettes, uncomplicated: Secondary | ICD-10-CM | POA: Diagnosis not present

## 2018-03-27 DIAGNOSIS — J449 Chronic obstructive pulmonary disease, unspecified: Secondary | ICD-10-CM | POA: Diagnosis not present

## 2018-03-27 DIAGNOSIS — W01198A Fall on same level from slipping, tripping and stumbling with subsequent striking against other object, initial encounter: Secondary | ICD-10-CM | POA: Diagnosis not present

## 2018-03-27 DIAGNOSIS — Z7982 Long term (current) use of aspirin: Secondary | ICD-10-CM | POA: Diagnosis not present

## 2018-03-27 DIAGNOSIS — M069 Rheumatoid arthritis, unspecified: Secondary | ICD-10-CM | POA: Diagnosis not present

## 2018-03-27 DIAGNOSIS — I252 Old myocardial infarction: Secondary | ICD-10-CM | POA: Insufficient documentation

## 2018-03-27 DIAGNOSIS — F10920 Alcohol use, unspecified with intoxication, uncomplicated: Secondary | ICD-10-CM | POA: Insufficient documentation

## 2018-03-27 DIAGNOSIS — I10 Essential (primary) hypertension: Secondary | ICD-10-CM | POA: Diagnosis not present

## 2018-03-27 DIAGNOSIS — Z79899 Other long term (current) drug therapy: Secondary | ICD-10-CM | POA: Diagnosis not present

## 2018-03-27 DIAGNOSIS — R51 Headache: Secondary | ICD-10-CM | POA: Diagnosis present

## 2018-03-27 DIAGNOSIS — W19XXXA Unspecified fall, initial encounter: Secondary | ICD-10-CM

## 2018-03-27 LAB — CBC WITH DIFFERENTIAL/PLATELET
ABS IMMATURE GRANULOCYTES: 0.1 10*3/uL (ref 0.0–0.1)
Basophils Absolute: 0 10*3/uL (ref 0.0–0.1)
Basophils Relative: 0 %
Eosinophils Absolute: 0 10*3/uL (ref 0.0–0.7)
Eosinophils Relative: 1 %
HEMATOCRIT: 37.7 % (ref 36.0–46.0)
HEMOGLOBIN: 12.1 g/dL (ref 12.0–15.0)
IMMATURE GRANULOCYTES: 1 %
LYMPHS ABS: 0.6 10*3/uL — AB (ref 0.7–4.0)
LYMPHS PCT: 7 %
MCH: 34.1 pg — ABNORMAL HIGH (ref 26.0–34.0)
MCHC: 32.1 g/dL (ref 30.0–36.0)
MCV: 106.2 fL — AB (ref 78.0–100.0)
MONOS PCT: 6 %
Monocytes Absolute: 0.5 10*3/uL (ref 0.1–1.0)
NEUTROS ABS: 7.2 10*3/uL (ref 1.7–7.7)
NEUTROS PCT: 85 %
Platelets: 170 10*3/uL (ref 150–400)
RBC: 3.55 MIL/uL — ABNORMAL LOW (ref 3.87–5.11)
RDW: 13.9 % (ref 11.5–15.5)
WBC: 8.4 10*3/uL (ref 4.0–10.5)

## 2018-03-27 LAB — ETHANOL: Alcohol, Ethyl (B): 287 mg/dL — ABNORMAL HIGH (ref <10)

## 2018-03-27 LAB — URINALYSIS, ROUTINE W REFLEX MICROSCOPIC
BILIRUBIN URINE: NEGATIVE
GLUCOSE, UA: NEGATIVE mg/dL
Hgb urine dipstick: NEGATIVE
KETONES UR: NEGATIVE mg/dL
LEUKOCYTES UA: NEGATIVE
Nitrite: NEGATIVE
PH: 7 (ref 5.0–8.0)
Protein, ur: NEGATIVE mg/dL
SPECIFIC GRAVITY, URINE: 1.003 — AB (ref 1.005–1.030)

## 2018-03-27 LAB — COMPREHENSIVE METABOLIC PANEL
ALBUMIN: 3.9 g/dL (ref 3.5–5.0)
ALK PHOS: 122 U/L (ref 38–126)
ALT: 34 U/L (ref 0–44)
AST: 61 U/L — ABNORMAL HIGH (ref 15–41)
Anion gap: 10 (ref 5–15)
BUN: 9 mg/dL (ref 8–23)
CALCIUM: 8.4 mg/dL — AB (ref 8.9–10.3)
CO2: 28 mmol/L (ref 22–32)
CREATININE: 0.94 mg/dL (ref 0.44–1.00)
Chloride: 101 mmol/L (ref 98–111)
GFR calc Af Amer: >60 mL/min (ref >60)
GFR calc non Af Amer: >60 mL/min (ref >60)
Glucose, Bld: 101 mg/dL — ABNORMAL HIGH (ref 70–99)
Potassium: 3.7 mmol/L (ref 3.5–5.1)
SODIUM: 139 mmol/L (ref 135–145)
Total Bilirubin: 0.7 mg/dL (ref 0.3–1.2)
Total Protein: 7.4 g/dL (ref 6.5–8.1)

## 2018-03-27 LAB — CK: Total CK: 346 U/L — ABNORMAL HIGH (ref 38–234)

## 2018-03-27 NOTE — Discharge Instructions (Signed)
No fractures on your imaging.  Labs looked ok other than high alcohol level. Be careful when you are drinking. Follow-up with your primary care doctor. Return here for any new/acute changes.

## 2018-03-27 NOTE — ED Notes (Signed)
Taken to CT at this time. 

## 2018-03-27 NOTE — ED Provider Notes (Signed)
[facility] EMERGENCY DEPARTMENT Provider Note   CSN: [id number] Arrival date & time: 03/27/18  0057     History   Chief Complaint Chief Complaint  Patient presents with  . Fall    HPI Jocelyn Shelton is a 62 y.o. female.  The history is provided by the patient and medical records.  Fall      62 y.o. F with hx of CAD, HTN, leg pain, RA on methotrexate, scleroderma, presenting to the ED following a fall.  Patient reports she was at home this evening and feels like her feet got tangled up and she fell  States she landed on left knee and then top half of her fell onto the floor.  She did hit the left side of her face on the hardwood floor, no LOC.  States she tried to get up but was hurting so called daughter who helped her out of the floor.  States now she just has pain in the left side of her face along the cheek and left knee.  Denies headache, dizziness, confusion, numbness, or weakness.  She is on daily ASA and plavix.  Patient apparent told EMS she fell yesterday too, she denies this to me.  She denies hx of recurrent falls.  Past Medical History:  Diagnosis Date  . Avascular necrosis of humeral head, left (HCC) 06/17/2017  . Bronchiectasis (HCC) 06/17/2017  . CAD in native artery 05/23/2015  . High cholesterol   . Hypertension   . Leg pain    ABIs 1/18: R 1.2, L 1.1  . Myocardial infarction (HCC) 04/2015  . Reflux   . Rheumatoid arthritis (HCC)    "all over" (06/16/2017)  . Scleroderma [facility])     Patient Active Problem List   Diagnosis Date Noted  . Centrilobular emphysema (HCC) 07/29/2017  . ILD (interstitial lung disease) (HCC) 07/29/2017  . Hypoalbuminemia due to protein-calorie malnutrition (HCC) 06/17/2017  . Leg pain, diffuse 06/17/2017  . Dilation of esophagus 06/17/2017  . Bronchiectasis (HCC) 06/17/2017  . Avascular necrosis of humeral head, right (HCC) 06/17/2017  . Pulmonary hypertension (HCC), Possible 06/17/2017  . Coronary artery  disease involving native coronary artery of native heart without angina pectoris   . Hyponatremia 03/16/2017  . Foot ulcer, right (HCC)   . Scleroderma (HCC) 03/08/2016  . Immunosuppressed status (HCC)   . COPD (chronic obstructive pulmonary disease) (HCC) 08/30/2015  . Allergic rhinitis 12/03/2014  . Rheumatoid arthritis (HCC) 03/14/2013  . Other pancytopenia (HCC) 03/01/2013  . 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 Erythema nodosum 04/27/2011  . GERD (gastroesophageal reflux disease) 08/02/2008  . Callus of foot 03/18/2007  . Major depressive disorder, recurrent episode (HCC) 09/23/2006  . Tobacco use disorder 09/23/2006  . Primary hypertension 09/23/2006  . Raynauds syndrome 09/23/2006    Past Surgical History:  Procedure Laterality Date  . APPENDECTOMY  90's  . CARDIAC CATHETERIZATION N/A 05/22/2015   Procedure: Left Heart Cath and Coronary Angiography;  Surgeon: Lennette Bihari, MD;  Location: [facility] INVASIVE CV LAB;  Service: Cardiovascular;  Laterality: N/A;  . CARDIAC CATHETERIZATION N/A 05/22/2015   Procedure: Coronary Stent Intervention;  Surgeon: Lennette Bihari, MD;  Location: [facility] INVASIVE CV LAB;  Service: Cardiovascular;  Laterality: N/A;     OB History   None      Home Medications    Prior to Admission medications   Medication Sig Start Date End Date Taking? Authorizing Provider  acetaminophen (TYLENOL) 500 MG tablet Take 1,000 mg  by mouth daily as needed for pain.     [provider]  albuterol (PROVENTIL HFA) 108 (90 Base) MCG/ACT inhaler INHALE TWO PUFFS BY MOUTH EVERY 4 HOURS AS NEEDED FOR WHEEZING FOR COUGH FOR SHORTNESS OF BREATH 05/26/17   Wendee Beavers, DO  aspirin EC 81 MG EC tablet Take 1 tablet (81 mg total) by mouth daily. 05/24/15   Barrett, Joline Salt, PA-C  atorvastatin (LIPITOR) 40 MG tablet TAKE 1 TABLET BY MOUTH EVERY DAY AT 6PM 08/10/17   Tereso Newcomer T, PA-C  Black Cohosh 40 MG CAPS Take 1 capsule by mouth 2 (two) times  daily.    [provider]  budesonide-formoterol (SYMBICORT) 160-4.5 MCG/ACT inhaler Inhale 2 puffs into the lungs 2 (two) times daily. 08/25/17   [provider]  Calcium Carb-Cholecalciferol (CALCIUM 600+D3 PO) Take 1 tablet by mouth daily.    [provider]  clopidogrel (PLAVIX) 75 MG tablet Take 1 tablet (75 mg total) by mouth daily. 01/17/18   Wendee Beavers, DO  DIOVAN 80 MG tablet Take 1 tablet (80 mg total) by mouth daily. 03/09/18   Pricilla Riffle, MD  dorzolamide-timolol (COSOPT) 22.3-6.8 MG/ML ophthalmic solution Place 1 drop into both eyes 2 (two) times daily.    [provider]  famotidine (PEPCID) 20 MG tablet Take 1 tablet (20 mg total) by mouth at bedtime. 05/26/17   Wendee Beavers, DO  feeding supplement, ENSURE ENLIVE, (ENSURE ENLIVE) LIQD Take 237 mLs by mouth 2 (two) times daily between meals. 06/17/17   Casey Burkitt, MD  fluticasone [facility]) 50 MCG/ACT nasal spray USE TWO SPRAY(S) IN EACH NOSTRIL DAILY AS NEEDED FOR  RHINITIS 07/25/15   Althea Charon, Netta Neat, DO  folic acid (FOLVITE) 1 MG tablet TAKE 1 TABLET (1 MG TOTAL) BY MOUTH DAILY. 01/22/15   Karamalegos, Netta Neat, DO  hydrochlorothiazide (HYDRODIURIL) 25 MG tablet TAKE 1 TABLET BY MOUTH EVERY DAY 12/23/17   Wendee Beavers, DO  meloxicam (MOBIC) 15 MG tablet TAKE 1 TABLET BY MOUTH EVERY DAY 05/03/17   Wendee Beavers, DO  methotrexate (RHEUMATREX) 2.5 MG tablet TAKE 4 TABLETS (10MG ) BY MOUTH 2 TIMES A WEEK EVERY MONDAY AND TUESDAY 07/23/17   Wendee Beavers, DO  metoprolol tartrate (LOPRESSOR) 50 MG tablet TAKE 1 TABLET BY MOUTH TWICE A DAY 08/10/17   Tereso Newcomer T, PA-C  Multiple Vitamin (MULTIVITAMIN WITH MINERALS) TABS tablet Take 1 tablet by mouth daily.    [provider]  nicotine (NICODERM CQ - DOSED IN MG/24 HR) 7 mg/24hr patch Place 1 patch (7 mg total) onto the skin daily. 09/13/17   Wendee Beavers, DO  nitroGLYCERIN (NITROSTAT) 0.4 MG SL  tablet PLACE 1 TABLET UNDER THE TONGUE EVERY 5 MINUTES AS NEEDED FOR CHEST PAIN 06/23/17   Barrett, Joline Salt, PA-C  OLANZapine (ZYPREXA) 5 MG tablet TAKE 1 TABLET BY MOUTH EVERYDAY AT BEDTIME 01/17/18   Wendee Beavers, DO  pantoprazole (PROTONIX) 40 MG tablet Take 1 tablet (40 mg total) by mouth daily. 06/25/17   Wendee Beavers, DO  sulfaSALAzine (AZULFIDINE) 500 MG tablet Take 2 tablets (1,000 mg total) by mouth 2 (two) times daily. 05/26/17   Wendee Beavers, DO    Family History Family History  Problem Relation Age of Onset  . Hypertension Father   . Heart attack Mother   . Diabetes Daughter   . Diabetes Sister   . Lung cancer Brother 66    Social History Social  History   Tobacco Use  . Smoking status: Current Every Day Smoker    Packs/day: 0.33    Years: 46.00    Pack years: 15.18    Types: Cigarettes  . Smokeless tobacco: Never Used  Substance Use Topics  . Alcohol use: Yes    Alcohol/week: 4.0 standard drinks    Types: 4 Standard drinks or equivalent per week  . Drug use: No     Allergies   Ticagrelor; Cymbalta [duloxetine hcl]; Olanzapine; Aspirin; Lisinopril; and Tomato   Review of Systems Review of Systems  HENT: Positive for facial swelling.   Musculoskeletal: Positive for arthralgias.  All other systems reviewed and are negative.    Physical Exam Updated Vital Signs BP (!) 167/103 (BP Location: Left Arm)   Pulse 90   Temp 98.5 F (36.9 C) (Oral)   Resp 16   Ht 5\' 7"  (1.702 m)   Wt 65.8 kg   SpO2 99%   BMI 22.72 kg/m   Physical Exam  Constitutional: She is oriented to person, place, and time. She appears well-developed and well-nourished.  Awake, alert, breath smells like alcohol  HENT:  Head: Normocephalic and atraumatic.  Mouth/Throat: Oropharynx is clear and moist.  Area of contusion and swelling to left cheek, some tenderness of left inferior orbital rim; bridge of nose non-tender; mid-face stable; majority of dentition is missing  but remaining teeth remain intact without signs of trauma/injury  Eyes: Pupils are equal, round, and reactive to light. Conjunctivae and EOM are normal.  EOMs intact; no nystagmus  Neck: Normal range of motion.  Cardiovascular: Normal rate, regular rhythm and normal heart sounds.  Pulmonary/Chest: Effort normal and breath sounds normal.  Abdominal: Soft. Bowel sounds are normal.  Musculoskeletal: Normal range of motion.       Cervical back: Normal.       Thoracic back: Normal.       Lumbar back: Normal.  Contusion and bruising noted to left knee without diffuse swelling or bony deformity; maintains full ROM; normal distal sensation and perfusion  Neurological: She is alert and oriented to person, place, and time.  AAOx3, answering questions and following commands appropriately; equal strength UE and LE bilaterally; CN grossly intact; moves all extremities appropriately without ataxia; no facial droop  Skin: Skin is warm and dry.  Psychiatric: She has a normal mood and affect.  Nursing note and vitals reviewed.    ED Treatments / Results  Labs (all labs ordered are listed, but only abnormal results are displayed) Labs Reviewed  CBC WITH DIFFERENTIAL/PLATELET - Abnormal; Notable for the following components:      Result Value   RBC 3.55 (*)    MCV 106.2 (*)    MCH 34.1 (*)    Lymphs Abs 0.6 (*)    All other components within normal limits  ETHANOL - Abnormal; Notable for the following components:   Alcohol, Ethyl (B) 287 (*)    All other components within normal limits  CK - Abnormal; Notable for the following components:   Total CK 346 (*)    All other components within normal limits  COMPREHENSIVE METABOLIC PANEL - Abnormal; Notable for the following components:   Glucose, Bld 101 (*)    Calcium 8.4 (*)    AST 61 (*)    All other components within normal limits  URINALYSIS, ROUTINE W REFLEX MICROSCOPIC - Abnormal; Notable for the following components:   Color, Urine STRAW  (*)    Specific Gravity, Urine 1.003 (*)  All other components within normal limits    EKG None  Radiology Ct Head Wo Contrast  Result Date: 03/27/2018 CLINICAL DATA:  Larey Seat to floor tonight. On anticoagulation. History of hypertension, hypercholesterolemia. EXAM: CT HEAD WITHOUT CONTRAST CT MAXILLOFACIAL WITHOUT CONTRAST TECHNIQUE: Multidetector CT imaging of the head and maxillofacial structures were performed using the standard protocol without intravenous contrast. Multiplanar CT image reconstructions of the maxillofacial structures were also generated. COMPARISON:  None. FINDINGS: CT HEAD FINDINGS BRAIN: No intraparenchymal hemorrhage, mass effect nor midline shift. Mild parenchymal brain volume loss. No hydrocephalus. Patchy supratentorial white matter hypodensities within normal range for patient's age, though non-specific are most compatible with chronic small vessel ischemic disease. No acute large vascular territory infarcts. No abnormal extra-axial fluid collections. Basal cisterns are patent. VASCULAR: Trace calcific atherosclerosis of the carotid siphons. SKULL: No skull fracture. Persistent sphenoid occipital synchondrosis. No significant scalp soft tissue swelling. OTHER: None. CT MAXILLOFACIAL FINDINGS OSSEOUS: The mandible is intact, the condyles are located. RIGHT temporomandibular osteoarthrosis with glenoid fossa overgrowth. No acute facial fracture. No destructive bony lesions. Severe osteopenia. Moderate to severe LEFT C4-5 and severe RIGHT C5-6 neural foraminal narrowing. Patient is edentulous. ORBITS: Ocular globes and orbital contents are normal. SINUSES: Mild paranasal sinus mucosal thickening without air-fluid levels. Nasal septum is midline. Bilateral concha lamella. Mastoid air cells are well aerated. Included mastoid aircells are well aerated. SOFT TISSUES: LEFT facial soft tissue swelling with subcutaneous fat stranding extending to LEFT periorbital soft tissues. No  subcutaneous gas or radiopaque foreign bodies. Mild calcific atherosclerosis carotid bifurcations. IMPRESSION: CT HEAD: 1. No acute intracranial process. 2. Mild parenchymal brain volume loss. CT MAXILLOFACIAL: 1. LEFT facial soft tissue swelling/contusion. No acute facial fracture. 2. Severe osteopenia. 3. Severe RIGHT C5-6 neural foraminal narrowing. Electronically Signed   By: Awilda Metro M.D.   On: 03/27/2018 01:56   Dg Knee Complete 4 Views Left  Result Date: 03/27/2018 CLINICAL DATA:  62 year old female with fall and left knee pain. EXAM: LEFT KNEE - COMPLETE 4+ VIEW COMPARISON:  None. FINDINGS: There is no acute fracture or dislocation. Amorphous medullary area of sclerotic change involving the proximal tibial metadiaphysis as well as distal femur most likely sequela of prior avascular necrosis or bone infarct. Direct comparison with prior images, if available, recommended. There is no associated soft tissue no erosive changes or cortical breakage. No abnormal periosteal reaction or other aggressive features. MRI may provide better evaluation if clinically indicated. No joint effusion. The soft tissues are unremarkable. IMPRESSION: 1. No acute fracture or dislocation. 2. Amorphous areas of sclerotic changes of the distal femur and proximal tibia likely sequela of prior bone infarct or avascular necrosis. No radiographic aggressive features. Electronically Signed   By: Elgie Collard M.D.   On: 03/27/2018 01:58   Ct Maxillofacial Wo Contrast  Result Date: 03/27/2018 CLINICAL DATA:  Larey Seat to floor tonight. On anticoagulation. History of hypertension, hypercholesterolemia. EXAM: CT HEAD WITHOUT CONTRAST CT MAXILLOFACIAL WITHOUT CONTRAST TECHNIQUE: Multidetector CT imaging of the head and maxillofacial structures were performed using the standard protocol without intravenous contrast. Multiplanar CT image reconstructions of the maxillofacial structures were also generated. COMPARISON:  None.  FINDINGS: CT HEAD FINDINGS BRAIN: No intraparenchymal hemorrhage, mass effect nor midline shift. Mild parenchymal brain volume loss. No hydrocephalus. Patchy supratentorial white matter hypodensities within normal range for patient's age, though non-specific are most compatible with chronic small vessel ischemic disease. No acute large vascular territory infarcts. No abnormal extra-axial fluid collections. Basal cisterns are patent. VASCULAR: Trace  calcific atherosclerosis of the carotid siphons. SKULL: No skull fracture. Persistent sphenoid occipital synchondrosis. No significant scalp soft tissue swelling. OTHER: None. CT MAXILLOFACIAL FINDINGS OSSEOUS: The mandible is intact, the condyles are located. RIGHT temporomandibular osteoarthrosis with glenoid fossa overgrowth. No acute facial fracture. No destructive bony lesions. Severe osteopenia. Moderate to severe LEFT C4-5 and severe RIGHT C5-6 neural foraminal narrowing. Patient is edentulous. ORBITS: Ocular globes and orbital contents are normal. SINUSES: Mild paranasal sinus mucosal thickening without air-fluid levels. Nasal septum is midline. Bilateral concha lamella. Mastoid air cells are well aerated. Included mastoid aircells are well aerated. SOFT TISSUES: LEFT facial soft tissue swelling with subcutaneous fat stranding extending to LEFT periorbital soft tissues. No subcutaneous gas or radiopaque foreign bodies. Mild calcific atherosclerosis carotid bifurcations. IMPRESSION: CT HEAD: 1. No acute intracranial process. 2. Mild parenchymal brain volume loss. CT MAXILLOFACIAL: 1. LEFT facial soft tissue swelling/contusion. No acute facial fracture. 2. Severe osteopenia. 3. Severe RIGHT C5-6 neural foraminal narrowing. Electronically Signed   By: Awilda Metro M.D.   On: 03/27/2018 01:56    Procedures Procedures (including critical care time)  Medications Ordered in ED Medications - No data to display   Initial Impression / Assessment and Plan / ED  Course  I have reviewed the triage vital signs and the nursing notes.  Pertinent labs & imaging results that were available during my care of the patient were reviewed by me and considered in my medical decision making (see chart for details).  62 year old female here after a fall.  Reports her feet got "tangled" and she fell.  Denies any dizziness or feelings of syncope prior to fall.  States she laid there for about an hour before her daughter was able to help her get up.  She reports pain in the left side of her face where she struck it on the floor, no loss of consciousness.  Also has some left knee pain.  She does take aspirin and Plavix.  Has area of contusion to the left upper cheek but remainder of face is atraumatic.  Has some bruising to the left knee as well.  She is awake, alert, appropriately oriented here, however breath smells of alcohol.  Suspect this likely contributed to her fall.  Labs and imaging pending.  Labs overall reassuring, ethanol 287.  CK mildly elevated but not clinically significant.  Imaging negative for acute traumatic injuries.  Patient was allowed to sober here, remained stable.  able to get up and ambulate to the bathroom here.  Her daughter was contacted and has come to pick her up.  I discussed limiting her alcohol.  Close follow-up with PCP.  Discussed plan with patient, she acknowledged understanding and agreed with plan of care.  Return precautions given for new or worsening symptoms.  Final Clinical Impressions(s) / ED Diagnoses   Final diagnoses:  Fall, initial encounter  Alcoholic intoxication without complication [facility])    ED Discharge Orders    None       Garlon Hatchet, PA-C 03/27/18 1779    Nira Conn, MD 03/27/18 [phone]

## 2018-03-27 NOTE — ED Triage Notes (Signed)
Brought by ems from home.  Per daughter patient fell on floor tonight.  Denies any LOC.  Reports she laid in the floor for about an hour.  Daughter was able to get patient up.  Brought due to swelling to left side of face.  Per patient that was from a fall yesterday.  Also endorses pain in left knee with some bruising from fall.

## 2018-04-24 ENCOUNTER — Other Ambulatory Visit: Payer: Self-pay | Admitting: Family Medicine

## 2018-04-24 DIAGNOSIS — I10 Essential (primary) hypertension: Secondary | ICD-10-CM

## 2018-04-25 ENCOUNTER — Encounter: Payer: Self-pay | Admitting: Internal Medicine

## 2018-04-25 ENCOUNTER — Ambulatory Visit (INDEPENDENT_AMBULATORY_CARE_PROVIDER_SITE_OTHER): Payer: Medicaid Other | Admitting: Internal Medicine

## 2018-04-25 VITALS — BP 142/74 | HR 69 | Ht 67.0 in | Wt 154.6 lb

## 2018-04-25 DIAGNOSIS — I10 Essential (primary) hypertension: Secondary | ICD-10-CM

## 2018-04-25 DIAGNOSIS — I2510 Atherosclerotic heart disease of native coronary artery without angina pectoris: Secondary | ICD-10-CM | POA: Diagnosis not present

## 2018-04-25 DIAGNOSIS — E785 Hyperlipidemia, unspecified: Secondary | ICD-10-CM

## 2018-04-25 LAB — LIPID PANEL
CHOL/HDL RATIO: 2 ratio (ref 0.0–4.4)
Cholesterol, Total: 109 mg/dL (ref 100–199)
HDL: 54 mg/dL (ref >39)
LDL CALC: 43 mg/dL (ref 0–99)
TRIGLYCERIDES: 62 mg/dL (ref 0–149)
VLDL CHOLESTEROL CAL: 12 mg/dL (ref 5–40)

## 2018-04-25 MED ORDER — VALSARTAN 160 MG PO TABS: 160.0000 mg | ORAL_TABLET | Freq: Every day | ORAL | 3 refills | Status: DC

## 2018-04-25 NOTE — Patient Instructions (Signed)
Your physician has recommended you make the following change in your medication:  1.) increase diovan to 160 mg once a day for blood pressure  Your physician recommends that you return for lab work today (lipids)  Your physician wants you to follow-up in: 6 months with Dr. Tenny Craw.  You will receive a reminder letter in the mail two months in advance. If you don't receive a letter, please call our office to schedule the follow-up appointment.

## 2018-04-25 NOTE — Progress Notes (Signed)
Cardiology Office Note   Date:  04/25/2018   ID:  RANDALL RAMPERSAD, DOB 01/29/1956, MRN [id number]  PCP:  Wendee Beavers, DO  Cardiologist:   Dietrich Pates, MD   F/U of CAD     History of Present Illness: Jocelyn Shelton is a 62 y.o. female with a history of CAD (s/p NSTEMI in 2016:  Cath pt underwent PCI/DES to P RCA; PCI/DES to LCx)  ALso a history of HTN, SOB, hyponatremia   She was admitted in Auguust 2018  Stress test recommended   No ischemia noted on stress test    I saw the pt in September 2018    Sees Dr Abelardo Diesel  Has scleroderma   Occasional reflux    She was admitted in late 2018 with CP and syncope  CP wose with coughing   D Dimer elev but CT neg for PE   Syncope was felt to be orthostatic    Echo showed normal LVEF   No signif valve abnormalities   Rx with IV fluids   Seen by B SImmons after D/C (Jan 2019 )    Sicne seen has been feeling pretty good   No CP  Breathing is OK   Has cough some  No dizziness   No syncope    Has allergies    Current Meds  Medication Sig  . acetaminophen (TYLENOL) 500 MG tablet Take 1,000 mg by mouth daily as needed for pain.   Marland Kitchen albuterol (PROVENTIL HFA) 108 (90 Base) MCG/ACT inhaler INHALE TWO PUFFS BY MOUTH EVERY 4 HOURS AS NEEDED FOR WHEEZING FOR COUGH FOR SHORTNESS OF BREATH  . aspirin EC 81 MG EC tablet Take 1 tablet (81 mg total) by mouth daily.  Marland Kitchen atorvastatin (LIPITOR) 40 MG tablet TAKE 1 TABLET BY MOUTH EVERY DAY AT 6PM  . Black Cohosh 40 MG CAPS Take 1 capsule by mouth 2 (two) times daily.  . budesonide-formoterol (SYMBICORT) 160-4.5 MCG/ACT inhaler Inhale 2 puffs into the lungs 2 (two) times daily.  . Calcium Carb-Cholecalciferol (CALCIUM 600+D3 PO) Take 1 tablet by mouth daily.  . clopidogrel (PLAVIX) 75 MG tablet Take 1 tablet (75 mg total) by mouth daily.  Marland Kitchen DIOVAN 80 MG tablet Take 1 tablet (80 mg total) by mouth daily.  . dorzolamide-timolol (COSOPT) 22.3-6.8 MG/ML ophthalmic solution Place 1 drop into both eyes 2  (two) times daily.  . famotidine (PEPCID) 20 MG tablet Take 1 tablet (20 mg total) by mouth at bedtime.  . feeding supplement, ENSURE ENLIVE, (ENSURE ENLIVE) LIQD Take 237 mLs by mouth 2 (two) times daily between meals.  . fluticasone (FLONASE) 50 MCG/ACT nasal spray USE TWO SPRAY(S) IN EACH NOSTRIL DAILY AS NEEDED FOR  RHINITIS  . folic acid (FOLVITE) 1 MG tablet TAKE 1 TABLET (1 MG TOTAL) BY MOUTH DAILY.  . meloxicam (MOBIC) 15 MG tablet TAKE 1 TABLET BY MOUTH EVERY DAY  . methotrexate (RHEUMATREX) 2.5 MG tablet TAKE 4 TABLETS (10MG ) BY MOUTH 2 TIMES A WEEK EVERY MONDAY AND TUESDAY  . metoprolol tartrate (LOPRESSOR) 50 MG tablet TAKE 1 TABLET BY MOUTH TWICE A DAY  . Multiple Vitamin (MULTIVITAMIN WITH MINERALS) TABS tablet Take 1 tablet by mouth daily.  . nicotine (NICODERM CQ - DOSED IN MG/24 HR) 7 mg/24hr patch Place 1 patch (7 mg total) onto the skin daily.  . nitroGLYCERIN (NITROSTAT) 0.4 MG SL tablet PLACE 1 TABLET UNDER THE TONGUE EVERY 5 MINUTES AS NEEDED FOR CHEST PAIN  . OLANZapine (ZYPREXA) 5  MG tablet TAKE 1 TABLET BY MOUTH EVERYDAY AT BEDTIME  . pantoprazole (PROTONIX) 40 MG tablet Take 1 tablet (40 mg total) by mouth daily.  Marland Kitchen sulfaSALAzine (AZULFIDINE) 500 MG tablet Take 2 tablets (1,000 mg total) by mouth 2 (two) times daily.  . [DISCONTINUED] hydrochlorothiazide (HYDRODIURIL) 25 MG tablet TAKE 1 TABLET BY MOUTH EVERY DAY     Allergies:   Ticagrelor; Cymbalta [duloxetine hcl]; Olanzapine; Aspirin; Lisinopril; and Tomato   Past Medical History:  Diagnosis Date  . Avascular necrosis of humeral head, left (HCC) 06/17/2017  . Bronchiectasis (HCC) 06/17/2017  . CAD in native artery 05/23/2015  . High cholesterol   . Hypertension   . Leg pain    ABIs 1/18: R 1.2, L 1.1  . Myocardial infarction (HCC) 04/2015  . Reflux   . Rheumatoid arthritis (HCC)    "all over" (06/16/2017)  . Scleroderma [facility])     Past Surgical History:  Procedure Laterality Date  . APPENDECTOMY   90's  . CARDIAC CATHETERIZATION N/A 05/22/2015   Procedure: Left Heart Cath and Coronary Angiography;  Surgeon: Lennette Bihari, MD;  Location: [facility] INVASIVE CV LAB;  Service: Cardiovascular;  Laterality: N/A;  . CARDIAC CATHETERIZATION N/A 05/22/2015   Procedure: Coronary Stent Intervention;  Surgeon: Lennette Bihari, MD;  Location: [facility] INVASIVE CV LAB;  Service: Cardiovascular;  Laterality: N/A;     Social History:  The patient  reports that she has been smoking cigarettes. She has a 15.18 pack-year smoking history. She has never used smokeless tobacco. She reports that she drinks about 4.0 standard drinks of alcohol per week. She reports that she does not use drugs.   Family History:  The patient's family history includes Diabetes in her daughter and sister; Heart attack in her mother; Hypertension in her father; Lung cancer (age of onset: 58) in her brother.    ROS:  Please see the history of present illness. All other systems are reviewed and  Negative to the above problem except as noted.    PHYSICAL EXAM: VS:  BP (!) 142/74   Pulse 69   Ht 5\' 7"  (1.702 m)   Wt 154 lb 9.6 oz (70.1 kg)   SpO2 91%   BMI 24.21 kg/m   GEN: Well nourished, well developed, in no acute distress  HEENT: normal  Neck:  JVP is normal    No , carotid bruits, or masses Cardiac: RRR; no murmurs, rubs, or gallops,no edema  Respiratory  Wheezes and pops   Moving air   GI: soft, nontender, nondistended, + BS  No hepatomegaly  MS: no deformity Moving all extremities   Skin: warm and dry, no rash Neuro:  Strength and sensation are intact Psych: euthymic mood, full affect   EKG:  EKG is not ordered today.   Lipid Panel    Component Value Date/Time   CHOL 84 06/17/2017 0559   TRIG 38 06/17/2017 0559   HDL 33 (L) 06/17/2017 0559   CHOLHDL 2.5 06/17/2017 0559   VLDL 8 06/17/2017 0559   LDLCALC 43 06/17/2017 0559      Wt Readings from Last 3 Encounters:  04/25/18 154 lb 9.6 oz (70.1 kg)  03/27/18 145  lb 1 oz (65.8 kg)  01/06/18 139 lb 3.2 oz (63.1 kg)      ASSESSMENT AND PLAN:  1  CAD    No symptoms of angina   Keep on same meds    2  HTN  BP is a little up  Was higher before    I would increase to 160 mg per day    Follow in medicine clinic     3  HL  Lipids are good  No changes     4   Hyponatremia   Hx of   Last Na 139      5   Pulmonary   Pt with some wheezing, pops today   Moving air   Has inhalers   She will f/u with primary provider    6  Anemia Will need close f/u of Hgb      Current medicines are reviewed at length with the patient today.  The patient does not have concerns regarding medicines.  Signed, Dietrich Pates, MD  04/25/2018 10:03 AM    [facility] [address] [place], [place], [place]  [address] Phone: [phone]; Fax: [phone]

## 2018-05-03 ENCOUNTER — Encounter: Payer: Self-pay | Admitting: Sports Medicine

## 2018-05-03 ENCOUNTER — Ambulatory Visit: Payer: Medicaid Other | Admitting: Sports Medicine

## 2018-05-03 DIAGNOSIS — M79671 Pain in right foot: Secondary | ICD-10-CM

## 2018-05-03 DIAGNOSIS — Q828 Other specified congenital malformations of skin: Secondary | ICD-10-CM | POA: Diagnosis not present

## 2018-05-03 NOTE — Progress Notes (Signed)
Subjective: Jocelyn Shelton is a 62 y.o. female patient who returns to office for evaluation of Right foot pain secondary to callus skin. Patient complains of pain at the lesions present Right foot at the 1st toe and bottom of foot; reports that previous treatment has helped.  Patient denies any changes with medical status since last visit.   Patient Active Problem List   Diagnosis Date Noted  . Centrilobular emphysema ([place]) 07/29/2017  . ILD (interstitial lung disease) ([place]) 07/29/2017  . Hypoalbuminemia due to protein-calorie malnutrition ([place]) 06/17/2017  . Leg pain, diffuse 06/17/2017  . Dilation of esophagus 06/17/2017  . Bronchiectasis ([place]) 06/17/2017  . Avascular necrosis of humeral head, right ([place]) 06/17/2017  . Pulmonary hypertension ([place]), Possible 06/17/2017  . Coronary artery disease involving native coronary artery of native heart without angina pectoris   . Hyponatremia 03/16/2017  . Foot ulcer, right ([place])   . Scleroderma ([place]) 03/08/2016  . Immunosuppressed status ([place])   . COPD (chronic obstructive pulmonary disease) ([place]) 08/30/2015  . Allergic rhinitis 12/03/2014  . Rheumatoid arthritis ([place]) 03/14/2013  . Other pancytopenia ([place]) 03/01/2013  . 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 Erythema nodosum 04/27/2011  . GERD (gastroesophageal reflux disease) 08/02/2008  . Callus of foot 03/18/2007  . Major depressive disorder, recurrent episode ([place]) 09/23/2006  . Tobacco use disorder 09/23/2006  . Primary hypertension 09/23/2006  . Raynauds syndrome 09/23/2006    Current Outpatient Medications on File Prior to Visit  Medication Sig Dispense Refill  . acetaminophen (TYLENOL) 500 MG tablet Take 1,000 mg by mouth daily as needed for pain.     Marland Kitchen albuterol (PROVENTIL HFA) 108 (90 Base) MCG/ACT inhaler INHALE TWO PUFFS BY MOUTH EVERY 4 HOURS AS NEEDED FOR WHEEZING FOR COUGH FOR SHORTNESS OF BREATH 7 each 1  . aspirin EC 81 MG EC tablet Take 1 tablet  (81 mg total) by mouth daily.    Marland Kitchen atorvastatin (LIPITOR) 40 MG tablet TAKE 1 TABLET BY MOUTH EVERY DAY AT 6PM 90 tablet 2  . Black Cohosh 40 MG CAPS Take 1 capsule by mouth 2 (two) times daily.    . budesonide-formoterol (SYMBICORT) 160-4.5 MCG/ACT inhaler Inhale 2 puffs into the lungs 2 (two) times daily.    . Calcium Carb-Cholecalciferol (CALCIUM 600+D3 PO) Take 1 tablet by mouth daily.    . clopidogrel (PLAVIX) 75 MG tablet Take 1 tablet (75 mg total) by mouth daily. 90 tablet 3  . dorzolamide-timolol (COSOPT) 22.3-6.8 MG/ML ophthalmic solution Place 1 drop into both eyes 2 (two) times daily.    . famotidine (PEPCID) 20 MG tablet Take 1 tablet (20 mg total) by mouth at bedtime. 30 tablet 0  . feeding supplement, ENSURE ENLIVE, (ENSURE ENLIVE) LIQD Take 237 mLs by mouth 2 (two) times daily between meals. 237 mL 12  . fluticasone (FLONASE) 50 MCG/ACT nasal spray USE TWO SPRAY(S) IN EACH NOSTRIL DAILY AS NEEDED FOR  RHINITIS 16 g 2  . folic acid (FOLVITE) 1 MG tablet TAKE 1 TABLET (1 MG TOTAL) BY MOUTH DAILY. 30 tablet 6  . hydrochlorothiazide (HYDRODIURIL) 25 MG tablet TAKE 1 TABLET BY MOUTH EVERY DAY 90 tablet 1  . latanoprost (XALATAN) 0.005 % ophthalmic solution PLACE 1 DROP INTO BOTH EYES EVERY EVENING  6  . meloxicam (MOBIC) 15 MG tablet TAKE 1 TABLET BY MOUTH EVERY DAY 30 tablet 0  . methotrexate (RHEUMATREX) 2.5 MG tablet TAKE 4 TABLETS (10MG) BY MOUTH 2 TIMES A WEEK EVERY MONDAY AND TUESDAY 32 tablet 0  .  metoprolol tartrate (LOPRESSOR) 50 MG tablet TAKE 1 TABLET BY MOUTH TWICE A DAY 180 tablet 2  . Multiple Vitamin (MULTIVITAMIN WITH MINERALS) TABS tablet Take 1 tablet by mouth daily.    . nicotine (NICODERM CQ - DOSED IN MG/24 HR) 7 mg/24hr patch Place 1 patch (7 mg total) onto the skin daily. 28 patch 0  . nitroGLYCERIN (NITROSTAT) 0.4 MG SL tablet PLACE 1 TABLET UNDER THE TONGUE EVERY 5 MINUTES AS NEEDED FOR CHEST PAIN 25 tablet 5  . OLANZapine (ZYPREXA) 5 MG tablet TAKE 1 TABLET BY  MOUTH EVERYDAY AT BEDTIME 90 tablet 3  . pantoprazole (PROTONIX) 40 MG tablet Take 1 tablet (40 mg total) by mouth daily. 90 tablet 3  . sulfaSALAzine (AZULFIDINE) 500 MG tablet Take 2 tablets (1,000 mg total) by mouth 2 (two) times daily. 120 tablet 0  . valsartan (DIOVAN) 160 MG tablet Take 1 tablet (160 mg total) by mouth daily. 90 tablet 3   No current facility-administered medications on file prior to visit.     Allergies  Allergen Reactions  . Ticagrelor Shortness Of Breath  . Cymbalta [Duloxetine Hcl] Diarrhea and Nausea Only    No appetite, stomach pain  . Olanzapine Other (See Comments)    Interfered with Blood pressure medications  . Aspirin Other (See Comments)    Stomach cramps Stomach cramps  . Lisinopril Cough  . Tomato Rash    Objective:  General: Alert and oriented x3 in no acute distress  Dermatology: Keratotic lesion present sub met 5 and medial great toe on right with skin lines transversing the lesion, pain is present with direct pressure to the lesion with a central nucleated core noted, no webspace macerations, no ecchymosis bilateral, all nails x 10 are well manicured.  Vascular: Dorsalis Pedis and Posterior Tibial pedal pulses 1/4, Capillary Fill Time 5 seconds, no pedal hair growth bilateral, no edema bilateral lower extremities, Temperature gradient within normal limits.  Neurology: Johney Maine sensation intact via light touch bilateral.  Musculoskeletal: Mild tenderness with palpation at the keratotic lesion sites on Right, Muscular strength 5/5 in all groups without pain or limitation on range of motion. No lower extremity muscular or boney deformity noted.  Assessment and Plan: Problem List Items Addressed This Visit    None    Visit Diagnoses    Porokeratosis    -  Primary   Right foot pain          -Complete examination performed -Discussed treatment options -Parred keratoic lesions x2 using a chisel; treated the area withSalinocaine covered with  bandaid -Dispensed moleskin and toe cap to use as instructed -Advised good supportive shoes and inserts -Patient to return to office in 3 months or as needed or sooner if condition worsens.  Landis Martins, DPM

## 2018-05-10 ENCOUNTER — Other Ambulatory Visit: Payer: Self-pay | Admitting: Physician Assistant

## 2018-05-10 DIAGNOSIS — I10 Essential (primary) hypertension: Secondary | ICD-10-CM

## 2018-05-10 DIAGNOSIS — E785 Hyperlipidemia, unspecified: Secondary | ICD-10-CM

## 2018-06-20 ENCOUNTER — Ambulatory Visit: Payer: Medicaid Other | Admitting: Internal Medicine

## 2018-07-05 ENCOUNTER — Ambulatory Visit: Payer: Medicaid Other | Admitting: Sports Medicine

## 2018-07-06 ENCOUNTER — Encounter: Payer: Self-pay | Admitting: Podiatry

## 2018-07-06 ENCOUNTER — Ambulatory Visit: Payer: Medicaid Other | Admitting: Podiatry

## 2018-07-06 DIAGNOSIS — M349 Systemic sclerosis, unspecified: Secondary | ICD-10-CM | POA: Diagnosis not present

## 2018-07-06 DIAGNOSIS — Z9229 Personal history of other drug therapy: Secondary | ICD-10-CM

## 2018-07-06 DIAGNOSIS — Q828 Other specified congenital malformations of skin: Secondary | ICD-10-CM

## 2018-07-06 NOTE — Patient Instructions (Signed)
Corns and Calluses Corns are small areas of thickened skin that occur on the top, sides, or tip of a toe. They contain a cone-shaped core with a point that can press on a nerve below. This causes pain. Calluses are areas of thickened skin that can occur anywhere on the body including hands, fingers, palms, soles of the feet, and heels.Calluses are usually larger than corns. What are the causes? Corns and calluses are caused by rubbing (friction) or pressure, such as from shoes that are too tight or do not fit properly. What increases the risk? Corns are more likely to develop in people who have toe deformities, such as hammer toes. Since calluses can occur with friction to any area of the skin, calluses are more likely to develop in people who:  Work with their hands.  Wear shoes that fit poorly, shoes that are too tight, or shoes that are high-heeled.  Have toes deformities.  What are the signs or symptoms? Symptoms of a corn or callus include:  A hard growth on the skin.  Pain or tenderness under the skin.  Redness and swelling.  Increased discomfort while wearing tight-fitting shoes.  How is this diagnosed? Corns and calluses may be diagnosed with a medical history and physical exam. How is this treated? Corns and calluses may be treated with:  Removing the cause of the friction or pressure. This may include: ? Changing your shoes. ? Wearing shoe inserts (orthotics) or other protective layers in your shoes, such as a corn pad. ? Wearing gloves.  Medicines to help soften skin in the hardened, thickened areas.  Reducing the size of the corn or callus by removing the dead layers of skin.  Antibiotic medicines to treat infection.  Surgery, if a toe deformity is the cause.  Follow these instructions at home:  Take medicines only as directed by your health care provider.  If you were prescribed an antibiotic, finish all of it even if you start to feel better.  Wear  shoes that fit well. Avoid wearing high-heeled shoes and shoes that are too tight or too loose.  Wear any padding, protective layers, gloves, or orthotics as directed by your health care provider.  Soak your hands or feet and then use a file or pumice stone to soften your corn or callus. Do this as directed by your health care provider.  Check your corn or callus every day for signs of infection. Watch for: ? Redness, swelling, or pain. ? Fluid, blood, or pus. Contact a health care provider if:  Your symptoms do not improve with treatment.  You have increased redness, swelling, or pain at the site of your corn or callus.  You have fluid, blood, or pus coming from your corn or callus.  You have new symptoms. This information is not intended to replace advice given to you by your health care provider. Make sure you discuss any questions you have with your health care provider. Document Released: 04/18/2004 Document Revised: 01/31/2016 Document Reviewed: 07/09/2014 Elsevier Interactive Patient Education  2018 Elsevier Inc.  

## 2018-07-06 NOTE — Progress Notes (Signed)
Subjective: Jocelyn Shelton is a 62 y.o. y.o. female who presents with h/o scleroderma and RA, presents today with painful calluses right foot. Pain is aggravated when weightbearing with and without shoe gear.  . Pain is relieved with periodic professional debridement.  Jocelyn Shelton voices no new problems on today's visit.  She is also on the blood thinner, Plavix.  Past Medical History:  Diagnosis Date  . Avascular necrosis of humeral head, left (HCC) 06/17/2017  . Bronchiectasis (HCC) 06/17/2017  . CAD in native artery 05/23/2015  . High cholesterol   . Hypertension   . Leg pain    ABIs 1/18: R 1.2, L 1.1  . Myocardial infarction (HCC) 04/2015  . Reflux   . Rheumatoid arthritis (HCC)    "all over" (06/16/2017)  . Scleroderma (HCC)    Current Outpatient Medications:  .  acetaminophen (TYLENOL) 500 MG tablet, Take 1,000 mg by mouth daily as needed for pain. , Disp: , Rfl:  .  albuterol (PROVENTIL HFA) 108 (90 Base) MCG/ACT inhaler, INHALE TWO PUFFS BY MOUTH EVERY 4 HOURS AS NEEDED FOR WHEEZING FOR COUGH FOR SHORTNESS OF BREATH, Disp: 7 each, Rfl: 1 .  aspirin EC 81 MG EC tablet, Take 1 tablet (81 mg total) by mouth daily., Disp: , Rfl:  .  atorvastatin (LIPITOR) 40 MG tablet, TAKE 1 TABLET BY MOUTH EVERY DAY AT 6PM, Disp: 90 tablet, Rfl: 3 .  Black Cohosh 40 MG CAPS, Take 1 capsule by mouth 2 (two) times daily., Disp: , Rfl:  .  budesonide-formoterol (SYMBICORT) 160-4.5 MCG/ACT inhaler, Inhale 2 puffs into the lungs 2 (two) times daily., Disp: , Rfl:  .  Calcium Carb-Cholecalciferol (CALCIUM 600+D3 PO), Take 1 tablet by mouth daily., Disp: , Rfl:  .  clopidogrel (PLAVIX) 75 MG tablet, Take 1 tablet (75 mg total) by mouth daily., Disp: 90 tablet, Rfl: 3 .  dorzolamide-timolol (COSOPT) 22.3-6.8 MG/ML ophthalmic solution, Place 1 drop into both eyes 2 (two) times daily., Disp: , Rfl:  .  famotidine (PEPCID) 20 MG tablet, Take 1 tablet (20 mg total) by mouth at bedtime., Disp: 30 tablet,  Rfl: 0 .  feeding supplement, ENSURE ENLIVE, (ENSURE ENLIVE) LIQD, Take 237 mLs by mouth 2 (two) times daily between meals., Disp: 237 mL, Rfl: 12 .  fluticasone (FLONASE) 50 MCG/ACT nasal spray, USE TWO SPRAY(S) IN EACH NOSTRIL DAILY AS NEEDED FOR  RHINITIS, Disp: 16 g, Rfl: 2 .  folic acid (FOLVITE) 1 MG tablet, TAKE 1 TABLET (1 MG TOTAL) BY MOUTH DAILY., Disp: 30 tablet, Rfl: 6 .  hydrochlorothiazide (HYDRODIURIL) 25 MG tablet, TAKE 1 TABLET BY MOUTH EVERY DAY, Disp: 90 tablet, Rfl: 1 .  latanoprost (XALATAN) 0.005 % ophthalmic solution, PLACE 1 DROP INTO BOTH EYES EVERY EVENING, Disp: , Rfl: 6 .  LUMIGAN 0.01 % SOLN, INSTILL 1 DROP INTO BOTH EYES EVERY EVENING, Disp: , Rfl: 6 .  meloxicam (MOBIC) 15 MG tablet, TAKE 1 TABLET BY MOUTH EVERY DAY, Disp: 30 tablet, Rfl: 0 .  methotrexate (RHEUMATREX) 2.5 MG tablet, TAKE 4 TABLETS (10MG ) BY MOUTH 2 TIMES A WEEK EVERY MONDAY AND TUESDAY, Disp: 32 tablet, Rfl: 0 .  metoprolol tartrate (LOPRESSOR) 50 MG tablet, TAKE 1 TABLET BY MOUTH TWICE A DAY, Disp: 180 tablet, Rfl: 3 .  Multiple Vitamin (MULTIVITAMIN WITH MINERALS) TABS tablet, Take 1 tablet by mouth daily., Disp: , Rfl:  .  nicotine (NICODERM CQ - DOSED IN MG/24 HR) 7 mg/24hr patch, Place 1 patch (7 mg total) onto  the skin daily., Disp: 28 patch, Rfl: 0 .  nitroGLYCERIN (NITROSTAT) 0.4 MG SL tablet, PLACE 1 TABLET UNDER THE TONGUE EVERY 5 MINUTES AS NEEDED FOR CHEST PAIN, Disp: 25 tablet, Rfl: 5 .  OLANZapine (ZYPREXA) 5 MG tablet, TAKE 1 TABLET BY MOUTH EVERYDAY AT BEDTIME, Disp: 90 tablet, Rfl: 3 .  omeprazole (PRILOSEC) 20 MG capsule, Take by mouth., Disp: , Rfl:  .  pantoprazole (PROTONIX) 40 MG tablet, Take 1 tablet (40 mg total) by mouth daily., Disp: 90 tablet, Rfl: 3 .  predniSONE (DELTASONE) 1 MG tablet, TAKE 3 TABLETS BY MOUTH EVERY DAY, Disp: , Rfl:  .  predniSONE (DELTASONE) 1 MG tablet, Take 3 mg by mouth daily., Disp: , Rfl: 0 .  sulfaSALAzine (AZULFIDINE) 500 MG tablet, Take 2  tablets (1,000 mg total) by mouth 2 (two) times daily., Disp: 120 tablet, Rfl: 0 .  Tiotropium Bromide-Olodaterol (STIOLTO RESPIMAT) 2.5-2.5 MCG/ACT AERS, Inhale into the lungs., Disp: , Rfl:  .  valsartan (DIOVAN) 160 MG tablet, Take 1 tablet (160 mg total) by mouth daily., Disp: 90 tablet, Rfl: 3  Allergies  Allergen Reactions  . Ticagrelor Shortness Of Breath  . Cymbalta [Duloxetine Hcl] Diarrhea and Nausea Only    No appetite, stomach pain  . Olanzapine Other (See Comments)    Interfered with Blood pressure medications  . Aspirin Other (See Comments)    Stomach cramps Stomach cramps  . Lisinopril Cough  . Tomato Rash    Objective: Vascular Examination: Capillary refill time <4 seconds x 10 digits Dorsalis pedis pulses 1/4 b/l Posterior tibial pulses 1/4 b/l No digital hair x 10 digits Skin temperature gradient warm to cool b/l LE  Dermatological Examination: Skin thin and atrophic b/l  Toenails 1-5 b/l well maintained.  Hyperkeratotic lesion submet head 1 right foot.  Porokeratotic lesion submetatarsal head 5 right, plantarmedial IPJ right hallux. No edema, no erythema, no drainage, no flocculence.  Musculoskeletal: Muscle strength 5/5 to all LE muscle groups  Neurological: Sensation intact with 10 gram monofilament.  Assessment: 1. Painful porokeratosis submet head 5, plantarmedial hallux IPJ right foot 2. Callus submet head 1 right foot 3. Scleroderma 4. Patient on long-term blood thinner   Plan:  1.  Hyperkeratotic/Porokeratotic lesions debrided utilizing sterile chisel blade submet head 1 right foot, submetatarsal head 5 right, plantarmedial IPJ right hallux.  1. Patient to continue soft, supportive shoe gear 2. Patient to report any pedal injuries to medical professional immediately. 3. Avoid self trimming due to use of blood thinner, dx of blood thinner 4. Follow up 3 months. Patient/POA to call should there be a concern in the interim.

## 2018-07-11 ENCOUNTER — Encounter: Payer: Self-pay | Admitting: Pharmacist

## 2018-07-11 ENCOUNTER — Ambulatory Visit (INDEPENDENT_AMBULATORY_CARE_PROVIDER_SITE_OTHER): Payer: Self-pay | Admitting: Pharmacist

## 2018-07-11 DIAGNOSIS — J449 Chronic obstructive pulmonary disease, unspecified: Secondary | ICD-10-CM

## 2018-07-11 MED ORDER — AMLODIPINE BESYLATE 2.5 MG PO TABS: 2.5000 mg | ORAL_TABLET | Freq: Every day | ORAL | 3 refills | Status: DC

## 2018-07-11 NOTE — Progress Notes (Signed)
Patient ID: Jocelyn Shelton, female   DOB: 1956-01-13, 62 y.o.   MRN: [id number] Reviewed: I agree with Dr. Macky Lower documentation and management.

## 2018-07-11 NOTE — Progress Notes (Signed)
Jocelyn Shelton is a 62 y.o. female reports to clinical pharmacist appointment for COPD medication review and management. Patient did bring medications to visit.   Patient's current COPD medication regimen consists of: Stiolto (tiotropium-olodaterol) (switched from symbicort on 12/2), Albuterol  Patient Actions; denies-reports: reports COPD related symptoms. Symptoms include cough (sometimes with sputum), SOB  Patient Actions; denies-reports: reports  use of rescue inhaler with an estimated use of about 1 times per day. This is decreased from 2-3 times per day while she was taking Symbicort (budesonide-formoterol) prior to switch to [employer].  Patient reports no COPD exacerbations in the past year.   Patient Actions; denies-reports: reports a history of smoking and currently using at least 10 cig/day.   Allergies  Allergen Reactions  . Ticagrelor Shortness Of Breath  . Cymbalta [Duloxetine Hcl] Diarrhea and Nausea Only    No appetite, stomach pain  . Lisinopril Cough  . Tomato Rash   Prior to Admission medications   Medication Sig Start Date End Date Taking? Authorizing Provider  acetaminophen (TYLENOL) 500 MG tablet Take 1,000 mg by mouth daily as needed for pain.    Yes [provider]  albuterol (PROVENTIL HFA) 108 (90 Base) MCG/ACT inhaler INHALE TWO PUFFS BY MOUTH EVERY 4 HOURS AS NEEDED FOR WHEEZING FOR COUGH FOR SHORTNESS OF BREATH 05/26/17  Yes Wendee Beavers, DO  aspirin EC 81 MG EC tablet Take 1 tablet (81 mg total) by mouth daily. 05/24/15  Yes Barrett, Joline Salt, PA-C  Black Cohosh 40 MG CAPS Take 1 capsule by mouth daily.    Yes [provider]  Calcium Carb-Cholecalciferol (CALCIUM 600+D3 PO) Take 1 tablet by mouth daily.   Yes [provider]  clopidogrel (PLAVIX) 75 MG tablet Take 1 tablet (75 mg total) by mouth daily. 01/17/18  Yes Wendee Beavers, DO  folic acid (FOLVITE) 1 MG tablet TAKE 1 TABLET (1 MG TOTAL) BY MOUTH DAILY. Patient taking  differently: 800 mcg daily.  01/22/15  Yes Karamalegos, Netta Neat, DO  hydrochlorothiazide (HYDRODIURIL) 25 MG tablet TAKE 1 TABLET BY MOUTH EVERY DAY Patient taking differently: Take 12.5 mg by mouth daily.  04/25/18  Yes McMullen, David J, DO  LUMIGAN 0.01 % SOLN INSTILL 1 DROP INTO BOTH EYES EVERY EVENING 06/14/18  Yes [provider]  metoprolol tartrate (LOPRESSOR) 50 MG tablet TAKE 1 TABLET BY MOUTH TWICE A DAY Patient taking differently: 50 mg 2 (two) times daily.  05/10/18  Yes Pricilla Riffle, MD  Multiple Vitamin (MULTIVITAMIN WITH MINERALS) TABS tablet Take 1 tablet by mouth daily.   Yes [provider]  nitroGLYCERIN (NITROSTAT) 0.4 MG SL tablet PLACE 1 TABLET UNDER THE TONGUE EVERY 5 MINUTES AS NEEDED FOR CHEST PAIN Patient taking differently: Place 0.4 mg under the tongue.  06/23/17  Yes Barrett, Joline Salt, PA-C  OLANZapine (ZYPREXA) 5 MG tablet TAKE 1 TABLET BY MOUTH EVERYDAY AT BEDTIME 01/17/18  Yes Wendee Beavers, DO  pantoprazole (PROTONIX) 40 MG tablet Take 1 tablet (40 mg total) by mouth daily. 06/25/17  Yes Wendee Beavers, DO  predniSONE (DELTASONE) 1 MG tablet TAKE 3 TABLETS BY MOUTH EVERY DAY 06/27/18  Yes [provider]  Tiotropium Bromide-Olodaterol (STIOLTO RESPIMAT) 2.5-2.5 MCG/ACT AERS Inhale into the lungs. 06/27/18  Yes [provider]  valsartan (DIOVAN) 160 MG tablet Take 1 tablet (160 mg total) by mouth daily. 04/25/18  Yes Pricilla Riffle, MD  vitamin B-12 (CYANOCOBALAMIN) 500 MCG tablet Take 500 mcg by mouth daily.  Yes [provider]  atorvastatin (LIPITOR) 40 MG tablet TAKE 1 TABLET BY MOUTH EVERY DAY AT 6PM Patient not taking: Reported on 07/11/2018 05/10/18   Pricilla Riffle, MD  budesonide-formoterol [facility]) 160-4.5 MCG/ACT inhaler Inhale 2 puffs into the lungs 2 (two) times daily. 08/25/17   [provider]  dorzolamide-timolol (COSOPT) 22.3-6.8 MG/ML ophthalmic solution Place 1 drop into both eyes 2  (two) times daily.    [provider]  famotidine (PEPCID) 20 MG tablet Take 1 tablet (20 mg total) by mouth at bedtime. Patient not taking: Reported on 07/11/2018 05/26/17   Wendee Beavers, DO  feeding supplement, ENSURE ENLIVE, (ENSURE ENLIVE) LIQD Take 237 mLs by mouth 2 (two) times daily between meals. Patient not taking: Reported on 07/11/2018 06/17/17   Casey Burkitt, MD  fluticasone [facility]) 50 MCG/ACT nasal spray USE TWO SPRAY(S) IN [facility] NOSTRIL DAILY AS NEEDED FOR  RHINITIS Patient not taking: Reported on 07/11/2018 07/25/15   Smitty Cords, DO  latanoprost (XALATAN) 0.005 % ophthalmic solution PLACE 1 DROP INTO BOTH EYES EVERY EVENING 04/24/18   [provider]  meloxicam (MOBIC) 15 MG tablet TAKE 1 TABLET BY MOUTH EVERY DAY Patient not taking: Reported on 07/11/2018 05/03/17   Wendee Beavers, DO  methotrexate (RHEUMATREX) 2.5 MG tablet TAKE 4 TABLETS (10MG ) BY MOUTH 2 TIMES A WEEK EVERY MONDAY AND TUESDAY 07/23/17   07/25/17, DO  nicotine (NICODERM CQ - DOSED IN MG/24 HR) 7 mg/24hr patch Place 1 patch (7 mg total) onto the skin daily. Patient not taking: Reported on 07/11/2018 09/13/17   09/15/17, DO  omeprazole (PRILOSEC) 20 MG capsule Take by mouth. 06/07/18   [provider]  predniSONE (DELTASONE) 1 MG tablet Take 3 mg by mouth daily. 06/27/18   [provider]  sulfaSALAzine (AZULFIDINE) 500 MG tablet Take 2 tablets (1,000 mg total) by mouth 2 (two) times daily. Patient not taking: Reported on 07/11/2018 05/26/17   05/28/17, DO   Past Medical History:  Diagnosis Date  . Avascular necrosis of humeral head, left (HCC) 06/17/2017  . Bronchiectasis (HCC) 06/17/2017  . CAD in native artery 05/23/2015  . High cholesterol   . Hypertension   . Leg pain    ABIs 1/18: R 1.2, L 1.1  . Myocardial infarction (HCC) 04/2015  . Reflux   . Rheumatoid arthritis (HCC)    "all over" (06/16/2017)  .  Scleroderma (HCC)    Social History   Socioeconomic History  . Marital status: Divorced    Spouse name: Not on file  . Number of children: 1  . Years of education: Not on file  . Highest education level: Not on file  Occupational History  . Occupation: Retired  06/18/2017  . Financial resource strain: Not on file  . Food insecurity:    Worry: Not on file    Inability: Not on file  . Transportation needs:    Medical: Not on file    Non-medical: Not on file  Tobacco Use  . Smoking status: Current Every Day Smoker    Packs/day: 0.33    Years: 46.00    Pack years: 15.18    Types: Cigarettes  . Smokeless tobacco: Never Used  Substance and Sexual Activity  . Alcohol use: Yes    Alcohol/week: 4.0 standard drinks    Types: 4 Standard drinks or equivalent per week  . Drug use: No  . Sexual activity: Never  Lifestyle  .  Physical activity:    Days per week: Not on file    Minutes per session: Not on file  . Stress: Not on file  Relationships  . Social connections:    Talks on phone: Not on file    Gets together: Not on file    Attends religious service: Not on file    Active member of club or organization: Not on file    Attends meetings of clubs or organizations: Not on file    Relationship status: Not on file  Other Topics Concern  . Not on file  Social History Narrative  . Not on file   Family History  Problem Relation Age of Onset  . Hypertension Father   . Heart attack Mother   . Diabetes Daughter   . Diabetes Sister   . Lung cancer Brother 50    O: Ht Readings from Last 2 Encounters:  04/25/18 5\' 7"  (1.702 m)  03/27/18 5\' 7"  (1.702 m)   Wt Readings from Last 2 Encounters:  04/25/18 154 lb 9.6 oz (70.1 kg)  03/27/18 145 lb 1 oz (65.8 kg)    A/P:  COPD/ interstitial lung disease ; Findings/Recommendations:  . Patient has been changed to Stiolto at last pulmonary visit. Seems to be improved since change, will continue on Stiolto.   Hypertension:  with comorbid Raynaud's Dz Blood pressure elevated during visit.  Started patient on Amlodipine 2.5mg  daily.   Will follow up with Dr. 04/27/18 in 4-6 weeks.    The patient verbalized understanding of information provided by repeating back concepts discussed.

## 2018-07-11 NOTE — Patient Instructions (Addendum)
It was nice seeing you today!  1. Continue taking Stiolto once daily 2. Start Amlodipine 2.5mg  once daily 3. Follow up with Dr. Abelardo Diesel in 4-6 weeks

## 2018-07-11 NOTE — Assessment & Plan Note (Signed)
COPD/ interstitial lung disease ; Findings/Recommendations:  . Patient has been changed to Stiolto at last pulmonary visit. Seems to be improved since change, will continue on Stiolto.

## 2018-08-04 ENCOUNTER — Other Ambulatory Visit: Payer: Self-pay | Admitting: Family Medicine

## 2018-08-04 DIAGNOSIS — K219 Gastro-esophageal reflux disease without esophagitis: Secondary | ICD-10-CM

## 2018-08-11 ENCOUNTER — Ambulatory Visit: Payer: Medicaid Other | Admitting: Pharmacist

## 2018-08-11 ENCOUNTER — Encounter: Payer: Self-pay | Admitting: Pharmacist

## 2018-08-11 DIAGNOSIS — F17210 Nicotine dependence, cigarettes, uncomplicated: Secondary | ICD-10-CM | POA: Diagnosis not present

## 2018-08-11 DIAGNOSIS — J449 Chronic obstructive pulmonary disease, unspecified: Secondary | ICD-10-CM | POA: Diagnosis not present

## 2018-08-11 DIAGNOSIS — I10 Essential (primary) hypertension: Secondary | ICD-10-CM | POA: Diagnosis not present

## 2018-08-11 MED ORDER — HYDROCHLOROTHIAZIDE 25 MG PO TABS: 12.5000 mg | ORAL_TABLET | Freq: Every day | ORAL | 1 refills | Status: DC

## 2018-08-11 MED ORDER — CARVEDILOL 25 MG PO TABS: 25.0000 mg | ORAL_TABLET | Freq: Two times a day (BID) | ORAL | 3 refills | Status: DC

## 2018-08-11 MED ORDER — HYDROCHLOROTHIAZIDE 25 MG PO TABS: 25.0000 mg | ORAL_TABLET | Freq: Every day | ORAL | 1 refills | Status: DC

## 2018-08-11 NOTE — Patient Instructions (Addendum)
It was great to see you today!  We are going to make a few changes with your blood pressure medications.   1) Restart valsartan 160 mg once daily.  2) Stop amlodipine.  3) Stop metoprolol. Instead, start carvedilol 25 mg twice daily.  4) Continue 1/2 of the hydrocholorthiazide medication for now.  We called rheumatology; restart these medications when we send them in.     Schedule follow up with Dr. Abelardo Diesel in about 2-4 weeks. It would be best for [place] to check your blood pressure and get labs at that point.   Schedule follow up with [place] in pharmacy in about 2 months.

## 2018-08-11 NOTE — Progress Notes (Signed)
Patient ID: Jocelyn Shelton, female   DOB: 04/28/1956, 62 y.o.   MRN: [id number] Reviewed: I agree with Dr. Koval's documentation and management. 

## 2018-08-11 NOTE — Assessment & Plan Note (Signed)
Chronic lung disease with decreased dyspnea since change to Stiolto Respimat. The patient reports using albuterol once daily for dyspnea when she has coughing spells. Patient reports adherence with Stiolto inhaler. The daughter reports that patient's breathing may have improved since Stiolto was started at last clinic visit, but believes smoking is the cause of the patient's dyspnea.  -Continued Stiolto daily and albuterol PRN.  -Counseled patient to decrease smoking.

## 2018-08-11 NOTE — Assessment & Plan Note (Signed)
Longstanding Moderate tobacco abuse who is uninterested in quitting at this time however, willing to attempt intake reduction .  Counseled on the dangers of tobacco use, and was advised to reduce number of daily cigarettes. Patient was agreeable to reduce smoking from 10 cigarettes daily to 5 cigarettes. Reviewed strategies to maximize success

## 2018-08-11 NOTE — Assessment & Plan Note (Signed)
Hypertension longstanding currently uncontrolled on current medications. BP Goal = 130 mmHg. Patient is adherent with current medications.  -Discontinued metoprolol and amlodipine. Restarted valsartan 160 mg daily (patient has supply in hand). Continued HCTZ 12.5 mg daily (new Rx provided). Started carvedilol 25 mg BID (new Rx provided). -Counseled patient to monitor blood pressure at home once daily.

## 2018-08-11 NOTE — Progress Notes (Signed)
S:    Patient arrives ambulating with the assistance of cane, accompanied by daughter, April.  Presents to the clinic for hypertension evaluation, counseling, and management. Patient's PCP is Dr. Abelardo Diesel, last seen on 01/06/2018.   Patient reports adherence with medications. Patient self discontinued valsartan after last visit when amlodipine was initiated.  Current BP Medications include:  Amlodipine 2.5 mg daily; metoprolol 25 mg BID; HCTZ 12.5 mg daily  Antihypertensives tried in the past include: Valsartan 160 mg daily    ASCVD risk factors include: HTN; smoking; history of CAD  Home BP readings: Daughter reports generally 160/90 with home blood pressure cuff   O:  Physical Exam Vitals signs reviewed.  Constitutional:      Appearance: Normal appearance.  Pulmonary:     Effort: Pulmonary effort is normal.  Musculoskeletal:        General: Swelling and tenderness present.     Comments: Bilateral foot swelling with hyperasthesia  Neurological:     Mental Status: She is alert.  Psychiatric:        Mood and Affect: Mood normal.        Behavior: Behavior normal.      Review of Systems  Musculoskeletal: Positive for falls and joint pain.       Recent fall with mild knee pain after fall. Ambulates well with cane.     Last 3 Office BP readings: BP Readings from Last 3 Encounters:  08/11/18 (!) 162/90  07/11/18 (!) 178/92  04/25/18 (!) 142/74    BMET    Component Value Date/Time   NA 139 03/27/2018 0216   NA 128 (L) 03/30/2017 0922   K 3.7 03/27/2018 0216   CL 101 03/27/2018 0216   CO2 28 03/27/2018 0216   GLUCOSE 101 (H) 03/27/2018 0216   BUN 9 03/27/2018 0216   BUN 6 (L) 03/30/2017 0922   CREATININE 0.94 03/27/2018 0216   CREATININE 0.97 07/14/2016 0835   CALCIUM 8.4 (L) 03/27/2018 0216   GFRNONAA >60 03/27/2018 0216   GFRAA >60 03/27/2018 0216     A/P: Hypertension longstanding currently uncontrolled on current medications. BP Goal = 130 mmHg.  Patient is adherent with current medications.  -Discontinued metoprolol and amlodipine. Restarted valsartan 160 mg daily (patient has supply in hand). Continued HCTZ 12.5 mg daily (new Rx provided). Started carvedilol 25 mg BID (new Rx provided). -Counseled patient to monitor blood pressure at home once daily.   Longstanding Moderate tobacco abuse who is uninterested in quitting at this time however, willing to attempt intake reduction .  Counseled on the dangers of tobacco use, and was advised to reduce number of daily cigarettes. Patient was agreeable to reduce smoking from 10 cigarettes daily to 5 cigarettes. Reviewed strategies to maximize success  Chronic lung disease with decreased dyspnea since change to Stiolto Respimat. The patient reports using albuterol once daily for dyspnea when she has coughing spells. Patient reports adherence with Stiolto inhaler. The daughter reports that patient's breathing may have improved since Stiolto was started at last clinic visit, but believes smoking is the cause of the patient's dyspnea.  -Continued Stiolto daily and albuterol PRN.  -Counseled patient to decrease smoking.   Results reviewed and written information provided.   Total time in face-to-face counseling 30 minutes.   F/U Clinic Visit in one month with Dr. Abelardo Diesel, F/U in two months with Dr. Raymondo Band.  Patient seen with Trina Ao, PharmD Candidate and Lenward Chancellor, PharmD, PGY-1 resident and Catie Feliz Beam, PharmD,  PGY2 Pharmacy  Resident.

## 2018-09-08 ENCOUNTER — Ambulatory Visit: Payer: Medicaid Other | Admitting: Family Medicine

## 2018-09-08 ENCOUNTER — Encounter: Payer: Self-pay | Admitting: Family Medicine

## 2018-09-08 VITALS — BP 128/80 | HR 88 | Temp 98.3°F | Ht 67.5 in | Wt 167.8 lb

## 2018-09-08 DIAGNOSIS — Z79899 Other long term (current) drug therapy: Secondary | ICD-10-CM | POA: Diagnosis present

## 2018-09-08 DIAGNOSIS — F17200 Nicotine dependence, unspecified, uncomplicated: Secondary | ICD-10-CM | POA: Diagnosis not present

## 2018-09-08 DIAGNOSIS — I10 Essential (primary) hypertension: Secondary | ICD-10-CM | POA: Diagnosis not present

## 2018-09-08 NOTE — Assessment & Plan Note (Addendum)
Chronic.  Controlled.  On high intensity statin and daily aspirin along with Plavix. - Continue HCTZ 12.5 mg daily, carvedilol 25 mg twice daily, valsartan 160 mg daily

## 2018-09-08 NOTE — Assessment & Plan Note (Addendum)
Large burden due to underlying RA and multiple comorbidities primarily involving respiratory issues. - Discontinuing Protonix - See plan for hypertension - Advised patient to get larger pillbox and use (currently using  Lunch box to carry all of her medications) - Given form for screening colonoscopy - RTC 3 months or sooner if needed

## 2018-09-08 NOTE — Patient Instructions (Signed)
Thank you for coming in to see [place] today. Please see below to review our plan for today's visit.  We made a few changes to your medications.  See below for details:  For your blood pressure: - Decrease chlorothiazide to 12.5 mg daily (you are given a new prescription for this) - Stop the metoprolol and start the carvedilol 25 mg twice daily (you were also given a new prescription for this) - Restart your valsartan 160 mg daily - We discontinue your amlodipine  For your cholesterol: - Be sure you that you are taking Lipitor 40 mg daily  We will discontinue your Protonix for reflux.  I would encourage you to purchase a larger pillbox and be sure to use this to help organize her medications  Please call the clinic at [phone] if your symptoms worsen or you have any concerns. It was our pleasure to serve you.  Durward Parcel, DO [facility], PGY-3

## 2018-09-08 NOTE — Progress Notes (Signed)
   Subjective   Patient ID: Jocelyn Shelton    DOB: 06-24-1956, 63 y.o. female   MRN: [id number]  CC: "Follow-up"  HPI: Jocelyn Shelton is a 63 y.o. female who presents to clinic today for the following:  Polypharmacy: She was recently seen by our clinic pharmacist given her multiple medications.  Changes are mainly based around switching antihypertensives discontinuing Protonix.  Patient reports her swelling has resolved which she thought was related to the Protonix, although amlodipine was discontinued.  She denies symptoms of reflux.  Hypertension: Her goal has been systolics less than 130 and diastolics less than 80.  We recently restarted her valsartan and switched her metoprolol to carvedilol.  She is tolerating these medications.  She denies chest pain or worsening shortness of breath from her baseline given her underlying COPD.  Tobacco use disorder: She continues to smoke 10 cigarettes daily.  She states that she continues smoking because she has frequent cravings and is not interested in cessation today.  She has no prior attempts to quit.  She is followed by a pulmonologist for her pulmonary hypertension and ILD.  ROS: see HPI for pertinent.  PMFSH: RA, scleroderma, COPD, PVD with claudicationand h/o NSTEMI, HTN, MDD. Surgical historyLHC 04/2015 with stent placement, appendectomy. Family history MI, HTN, DM, lung cancer (borther age 49). Smoking status reviewed. Medications reviewed.  Objective   BP 128/80   Pulse 88   Temp 98.3 F (36.8 C) (Oral)   Ht 5' 7.5" (1.715 m)   Wt 167 lb 12.8 oz (76.1 kg)   SpO2 99%   BMI 25.89 kg/m  Vitals and nursing note reviewed.  General: frail elderly female with a cane, well nourished, well developed, NAD with non-toxic appearance HEENT: normocephalic, atraumatic, moist mucous membranes Neck: supple, non-tender without lymphadenopathy Cardiovascular: regular rate and rhythm without murmurs, rubs, or gallops Lungs: clear to auscultation  bilaterally with normal work of breathing Abdomen: soft, non-tender, non-distended, normoactive bowel sounds Skin: warm, dry, no rashes or lesions, cap refill < 2 seconds Extremities: warm and well perfused, normal tone, no edema  Assessment & Plan   Primary hypertension Chronic.  Controlled.  On high intensity statin and daily aspirin along with Plavix. - Continue HCTZ 12.5 mg daily, carvedilol 25 mg twice daily, valsartan 160 mg daily  Tobacco use disorder Chronic.  Not interested in cessation.  Has multiple comorbidities including RA, ILD, pulmonary hypertension. - Discussed smoking cessation at length  Polypharmacy Large burden due to underlying RA and multiple comorbidities primarily involving respiratory issues. - Discontinuing Protonix - See plan for hypertension - Advised patient to get larger pillbox and use (currently using  Lunch box to carry all of her medications) - Given form for screening colonoscopy - RTC 3 months or sooner if needed  No orders of the defined types were placed in this encounter.  No orders of the defined types were placed in this encounter.   Durward Parcel, DO [facility], PGY-3 09/08/2018, 1:57 PM

## 2018-09-08 NOTE — Assessment & Plan Note (Signed)
Chronic.  Not interested in cessation.  Has multiple comorbidities including RA, ILD, pulmonary hypertension. - Discussed smoking cessation at length

## 2018-10-05 ENCOUNTER — Ambulatory Visit: Payer: Medicaid Other | Admitting: Podiatry

## 2018-10-10 ENCOUNTER — Ambulatory Visit: Payer: Medicaid Other | Admitting: Pharmacist

## 2018-12-01 ENCOUNTER — Telehealth: Payer: Self-pay | Admitting: *Deleted

## 2018-12-01 ENCOUNTER — Telehealth (INDEPENDENT_AMBULATORY_CARE_PROVIDER_SITE_OTHER): Payer: Medicaid Other | Admitting: Internal Medicine

## 2018-12-01 ENCOUNTER — Other Ambulatory Visit: Payer: Self-pay

## 2018-12-01 ENCOUNTER — Encounter: Payer: Self-pay | Admitting: Internal Medicine

## 2018-12-01 VITALS — BP 180/121 | HR 102 | Ht 66.0 in | Wt 160.0 lb

## 2018-12-01 DIAGNOSIS — I2510 Atherosclerotic heart disease of native coronary artery without angina pectoris: Secondary | ICD-10-CM

## 2018-12-01 DIAGNOSIS — I10 Essential (primary) hypertension: Secondary | ICD-10-CM

## 2018-12-01 DIAGNOSIS — E782 Mixed hyperlipidemia: Secondary | ICD-10-CM | POA: Diagnosis not present

## 2018-12-01 NOTE — Patient Instructions (Signed)
Medication Instructions:  No changes If you need a refill on your cardiac medications before your next appointment, please call your pharmacy.   Lab work: none If you have labs (blood work) drawn today and your tests are completely normal, you will receive your results only by: Marland Kitchen MyChart Message (if you have MyChart) OR . A paper copy in the mail If you have any lab test that is abnormal or we need to change your treatment, we will call you to review the results.  Testing/Procedures: none  Follow-Up: At [facility], you and your health needs are our priority.  As part of our continuing mission to provide you with exceptional heart care, we have created designated Provider Care Teams.  These Care Teams include your primary Cardiologist (physician) and Advanced Practice Providers (APPs -  Physician Assistants and Nurse Practitioners) who all work together to provide you with the care you need, when you need it. You will need a follow up appointment in:  4 months.  Please call our office 2 months in advance to schedule this appointment.  You may see Dietrich Pates or one of the following Advanced Practice Providers on your designated Care Team: Tereso Newcomer, PA-C [place], [place] . Berton Bon, NP  Any Other Special Instructions Will Be Listed Below (If Applicable). Record your blood pressure once a day. We will call you in about 2 weeks to check on your readings.

## 2018-12-01 NOTE — Progress Notes (Signed)
Virtual Visit via Video Note   This visit type was conducted due to national recommendations for restrictions regarding the COVID-19 Pandemic (e.g. social distancing) in an effort to limit this patient's exposure and mitigate transmission in our community.  Due to her co-morbid illnesses, this patient is at least at moderate risk for complications without adequate follow up.  This format is felt to be most appropriate for this patient at this time.  All issues noted in this document were discussed and addressed.  A limited physical exam was performed with this format.  Please refer to the patient's chart for her consent to telehealth for [facility].    Date:  12/01/2018   ID:  Jocelyn Shelton, DOB 08/24/1955, MRN [id number]  Patient Location: Home Provider Location: Home  PCP:  Wendee BeaversMcMullen, David J, DO  Cardiologist:  No primary care provider on file.  Electrophysiologist:  None   Evaluation Performed:  Follow-Up Visit  Chief Complaint:  F/U of CAD   History of Present Illness:    Jocelyn Shelton is a 63 y.o. female with CAD, syncope He is s/p NSTEMI in 2016   Cath   PCI/DES to RCA; PCI/DES to LCx   He also has a history of HTN aons SOB    I saw her in September 2019  SInce seen she says she is fine   No CP   noSOB  The patient does not have symptoms concerning for COVID-19 infection (fever, chills, cough, or new shortness of breath).    Past Medical History:  Diagnosis Date  . Avascular necrosis of humeral head, left (HCC) 06/17/2017  . Bronchiectasis (HCC) 06/17/2017  . CAD in native artery 05/23/2015  . High cholesterol   . Hypertension   . Leg pain    ABIs 1/18: R 1.2, L 1.1  . Myocardial infarction (HCC) 04/2015  . Reflux   . Rheumatoid arthritis (HCC)    "all over" (06/16/2017)  . Scleroderma [facility](HCC)    Past Surgical History:  Procedure Laterality Date  . APPENDECTOMY  90's  . CARDIAC CATHETERIZATION N/A 05/22/2015   Procedure: Left Heart Cath and Coronary  Angiography;  Surgeon: Lennette Biharihomas A Kelly, MD;  [facility];  Service: Cardiovascular;  Laterality: N/A;  . CARDIAC CATHETERIZATION N/A 05/22/2015   Procedure: Coronary Stent Intervention;  Surgeon: Lennette Biharihomas A Kelly, MD;  Location: [facility] INVASIVE CV LAB;  Service: Cardiovascular;  Laterality: N/A;     Current Meds  Medication Sig  . acetaminophen (TYLENOL) 500 MG tablet Take 1,000 mg by mouth daily as needed for pain.   Marland Kitchen. albuterol (PROVENTIL HFA) 108 (90 Base) MCG/ACT inhaler INHALE TWO PUFFS BY MOUTH EVERY 4 HOURS AS NEEDED FOR WHEEZING FOR COUGH FOR SHORTNESS OF BREATH  . aspirin EC 81 MG EC tablet Take 1 tablet (81 mg total) by mouth daily.  Marland Kitchen. atorvastatin (LIPITOR) 40 MG tablet TAKE 1 TABLET BY MOUTH EVERY DAY AT 6PM  . Black Cohosh 40 MG CAPS Take 1 capsule by mouth daily.   . Calcium Carb-Cholecalciferol (CALCIUM 600+D3 PO) Take 1 tablet by mouth daily.  . carvedilol (COREG) 25 MG tablet Take 1 tablet (25 mg total) by mouth 2 (two) times daily with a meal.  . clopidogrel (PLAVIX) 75 MG tablet Take 1 tablet (75 mg total) by mouth daily.  . dorzolamide-timolol (COSOPT) 22.3-6.8 MG/ML ophthalmic solution Place 1 drop into both eyes 2 (two) times daily.  . fluticasone (FLONASE) 50 MCG/ACT nasal spray USE TWO SPRAY(S) IN  EACH NOSTRIL DAILY AS NEEDED FOR  RHINITIS  . folic acid (FOLVITE) 1 MG tablet TAKE 1 TABLET (1 MG TOTAL) BY MOUTH DAILY. (Patient taking differently: 800 mcg daily. )  . hydrochlorothiazide (HYDRODIURIL) 25 MG tablet Take 0.5 tablets (12.5 mg total) by mouth daily.  Marland Kitchen LUMIGAN 0.01 % SOLN INSTILL 1 DROP INTO BOTH EYES EVERY EVENING  . methotrexate (RHEUMATREX) 2.5 MG tablet TAKE 4 TABLETS (10MG ) BY MOUTH 2 TIMES A WEEK EVERY MONDAY AND TUESDAY (Patient taking differently: TAKE 4 TABLETS (10MG ) BY MOUTH 1 TIMES A WEEK EVERY MONDAY)  . Multiple Vitamin (MULTIVITAMIN WITH MINERALS) TABS tablet Take 1 tablet by mouth daily.  . nitroGLYCERIN (NITROSTAT) 0.4 MG SL tablet  PLACE 1 TABLET UNDER THE TONGUE EVERY 5 MINUTES AS NEEDED FOR CHEST PAIN  . OLANZapine (ZYPREXA) 5 MG tablet TAKE 1 TABLET BY MOUTH EVERYDAY AT BEDTIME  . predniSONE (DELTASONE) 1 MG tablet TAKE 3 TABLETS BY MOUTH EVERY DAY  . sulfaSALAzine (AZULFIDINE) 500 MG tablet Take 2 tablets (1,000 mg total) by mouth 2 (two) times daily.  . Tiotropium Bromide-Olodaterol (STIOLTO RESPIMAT) 2.5-2.5 MCG/ACT AERS Inhale into the lungs.  . valsartan (DIOVAN) 160 MG tablet Take 1 tablet (160 mg total) by mouth daily.  . vitamin B-12 (CYANOCOBALAMIN) 500 MCG tablet Take 500 mcg by mouth daily.     Allergies:   Ticagrelor; Cymbalta [duloxetine hcl]; Lisinopril; and Tomato   Social History   Tobacco Use  . Smoking status: Current Every Day Smoker    Packs/day: 0.50    Years: 46.00    Pack years: 23.00    Types: Cigarettes  . Smokeless tobacco: Never Used  Substance Use Topics  . Alcohol use: Yes    Alcohol/week: 4.0 standard drinks    Types: 4 Standard drinks or equivalent per week  . Drug use: No     Family Hx: The patient's family history includes Diabetes in her daughter and sister; Heart attack in her mother; Hypertension in her father; Lung cancer (age of onset: 24) in her brother.  ROS:   Please see the history of present illness.     All other systems reviewed and are negative.   Prior CV studies:   The following studies were reviewed today:   Labs/Other Tests and Data Reviewed:    EKG:  No EKG done as tele visit  Recent Labs: 03/27/2018: ALT 34; BUN 9; Creatinine, Ser 0.94; Hemoglobin 12.1; Platelets 170; Potassium 3.7; Sodium 139   Recent Lipid Panel Lab Results  Component Value Date/Time   CHOL 109 04/25/2018 10:34 AM   TRIG 62 04/25/2018 10:34 AM   HDL 54 04/25/2018 10:34 AM   CHOLHDL 2.0 04/25/2018 10:34 AM   CHOLHDL 2.5 06/17/2017 05:59 AM   LDLCALC 43 04/25/2018 10:34 AM    Wt Readings from Last 3 Encounters:  12/01/18 160 lb (72.6 kg)  09/08/18 167 lb 12.8 oz  (76.1 kg)  08/11/18 166 lb 12.8 oz (75.7 kg)     Objective:    Vital Signs:  BP (!) 180/121   Pulse (!) 102   Ht 5\' 6"  (1.676 m)   Wt 160 lb (72.6 kg)   BMI 25.82 kg/m    VS returned   ASSESSMENT & PLAN:    1  CAD  Pt has no symtpoms to sugg angina    2   Dyspnea  Pt says breathing is stable    3   HTN  BP is high  Usually 130/85   This  will need to be followed     Keep on same meds for now.    COVID-19 Education: The signs and symptoms of COVID-19 were discussed with the patient and how to seek care for testing (follow up with PCP or arrange E-visit).  The importance of social distancing was discussed today.  Time:   Today, I have spent 15 minutes with the patient with telehealth technology discussing the above problems.     Medication Adjustments/Labs and Tests Ordered: Current medicines are reviewed at length with the patient today.  Concerns regarding medicines are outlined above.   Tests Ordered: No orders of the defined types were placed in this encounter.   Medication Changes: No orders of the defined types were placed in this encounter.   Disposition:  Follow up  Plan for F/U in 4 months    Signed, Dietrich Pates, MD  12/01/2018 4:53 PM    [facility]

## 2018-12-01 NOTE — Telephone Encounter (Signed)
Pt in agreement to PHONE visit today with Dr. Tenny Craw.

## 2018-12-01 NOTE — Telephone Encounter (Signed)

## 2018-12-01 NOTE — Telephone Encounter (Signed)
Follow up  ° ° °Pt is returning call  ° ° °Please call back  °

## 2018-12-01 NOTE — Telephone Encounter (Signed)
Left message for patient to call back. Needs consent for telehealth visit for today with Dr. Tenny Craw.

## 2018-12-11 ENCOUNTER — Other Ambulatory Visit: Payer: Self-pay | Admitting: Family Medicine

## 2018-12-11 DIAGNOSIS — I10 Essential (primary) hypertension: Secondary | ICD-10-CM

## 2019-01-16 ENCOUNTER — Other Ambulatory Visit: Payer: Self-pay

## 2019-01-16 DIAGNOSIS — I2510 Atherosclerotic heart disease of native coronary artery without angina pectoris: Secondary | ICD-10-CM

## 2019-01-17 ENCOUNTER — Telehealth: Payer: Self-pay | Admitting: *Deleted

## 2019-01-17 NOTE — Telephone Encounter (Signed)
   Primary Cardiologist: Dorris Carnes, MD  Chart reviewed as part of pre-operative protocol coverage. Patient was contacted 01/17/2019 in reference to pre-operative risk assessment for pending surgery as outlined below.  Jocelyn Shelton was last seen on 12/01/18 by Dr. Harrington Challenger via telemedicine. She has history of CAD (NSTEMI/PCI 2016), HTN, AVN, bronchiectasis, HLD, RA, scleroderma. RCRI 0.9% indicating low risk of CV complications. I spoke with the patient and her daughter Jocelyn Shelton who assists in her medical care as her mom sometimes has memory difficulties. She has not had any chest pain in the last few months. Her chronic dyspnea is at baseline. Therefore, based on ACC/AHA guidelines, the patient would be at acceptable risk for the planned procedure without further cardiovascular testing. They do feel her coughing has gotten a little worse with increased mucus production. She is followed by pulmonology at [facility] so I recommended they call them to discuss - would consider getting pulm clearance for procedure if felt necessary as well.  I will route to Dr. Harrington Challenger for input on holding Plavix. Patients' daughter indicates she was recently found to have some abnormal liver enzymes which prompted the GI eval to begin with. Denies any bleeding. Dr. Harrington Challenger - Please route response to P CV DIV PREOP (the pre-op pool). Thank you.   Charlie Pitter, PA-C 01/17/2019, 2:23 PM

## 2019-01-17 NOTE — Telephone Encounter (Signed)
Hold plavix 5 days prior.   Resume when safe after

## 2019-01-17 NOTE — Telephone Encounter (Signed)
    [facility] Pre-operative Risk Assessment    Request for surgical clearance:  What type of surgery is being performed? COLONOSCOPY /EGD 1. When is this surgery scheduled?  TBD  2. What type of clearance is required (medical clearance vs. Pharmacy clearance to hold med vs. Both)? BOTH  3. Are there any medications that need to be held prior to surgery and how long?  PLAVIX  5-7 DAYS   4. Practice name and name of physician performing surgery? [facility]REDD[facility]  5. What is your office phone number [phone]   7.   What is your office fax number [phone]  8.   Anesthesia type (None, local, MAC, general) ? CHOICE   Devra Dopp 01/17/2019, 2:07 PM  _________________________________________________________________   (provider comments below)

## 2019-01-18 ENCOUNTER — Telehealth: Payer: Self-pay

## 2019-01-18 MED ORDER — OLANZAPINE 5 MG PO TABS: ORAL_TABLET | ORAL | 3 refills | Status: DC

## 2019-01-18 MED ORDER — CLOPIDOGREL BISULFATE 75 MG PO TABS: 75.0000 mg | ORAL_TABLET | Freq: Every day | ORAL | 3 refills | Status: DC

## 2019-01-18 NOTE — Telephone Encounter (Signed)
Will route to pre op pool

## 2019-01-19 ENCOUNTER — Other Ambulatory Visit: Payer: Self-pay | Admitting: Family Medicine

## 2019-01-19 NOTE — Telephone Encounter (Signed)
I see olanzapine tablets are on the preferred category?  Harriet [place], [place], PGY-3

## 2019-01-20 NOTE — Telephone Encounter (Signed)
An ADULT SAFETY WITH ANTIPSYCHOTIC PRESCRIBING prior Josem Kaufmann was needed.    Completed and approved til 01/20/20  Prior Approval #:[id number]  Pharmacy informed. Christen Bame, CMA

## 2019-02-09 NOTE — Telephone Encounter (Signed)
   Primary Cardiologist: Dorris Carnes, MD  Chart reviewed as part of pre-operative protocol coverage. Given past medical history and time since last visit, based on ACC/AHA guidelines, Domonique M Van would be at acceptable risk for the planned procedure without further cardiovascular testing. Consider pulmonary clearance pre op.  OK to hold Plavix 5 days pre op- resume when safe post op.    I will route this recommendation to the requesting party via Epic fax function and remove from pre-op pool.  Please call with questions.  Kerin Ransom, PA-C 02/09/2019, 10:17 AM

## 2019-03-14 ENCOUNTER — Other Ambulatory Visit: Payer: Self-pay

## 2019-03-14 ENCOUNTER — Other Ambulatory Visit: Payer: Self-pay | Admitting: Family Medicine

## 2019-03-14 DIAGNOSIS — I10 Essential (primary) hypertension: Secondary | ICD-10-CM

## 2019-03-14 MED ORDER — OLANZAPINE 5 MG PO TABS: ORAL_TABLET | ORAL | 3 refills | Status: DC

## 2019-03-28 ENCOUNTER — Other Ambulatory Visit: Payer: Self-pay

## 2019-03-28 ENCOUNTER — Emergency Department (HOSPITAL_COMMUNITY): Payer: Medicaid Other

## 2019-03-28 ENCOUNTER — Encounter (HOSPITAL_COMMUNITY): Payer: Self-pay

## 2019-03-28 ENCOUNTER — Other Ambulatory Visit: Payer: Self-pay | Admitting: Internal Medicine

## 2019-03-28 ENCOUNTER — Telehealth (INDEPENDENT_AMBULATORY_CARE_PROVIDER_SITE_OTHER): Payer: Medicaid Other | Admitting: Family Medicine

## 2019-03-28 ENCOUNTER — Emergency Department (HOSPITAL_COMMUNITY)
Admission: EM | Admit: 2019-03-28 | Discharge: 2019-03-28 | Disposition: A | Discharge status: Home / Self Care | Payer: Medicaid Other | Attending: Emergency Medicine | Admitting: Emergency Medicine

## 2019-03-28 VITALS — Temp 95.4°F

## 2019-03-28 DIAGNOSIS — J449 Chronic obstructive pulmonary disease, unspecified: Secondary | ICD-10-CM | POA: Diagnosis not present

## 2019-03-28 DIAGNOSIS — Z7902 Long term (current) use of antithrombotics/antiplatelets: Secondary | ICD-10-CM | POA: Insufficient documentation

## 2019-03-28 DIAGNOSIS — I10 Essential (primary) hypertension: Secondary | ICD-10-CM | POA: Diagnosis not present

## 2019-03-28 DIAGNOSIS — R05 Cough: Secondary | ICD-10-CM

## 2019-03-28 DIAGNOSIS — R638 Other symptoms and signs concerning food and fluid intake: Secondary | ICD-10-CM

## 2019-03-28 DIAGNOSIS — J849 Interstitial pulmonary disease, unspecified: Secondary | ICD-10-CM

## 2019-03-28 DIAGNOSIS — R633 Feeding difficulties: Secondary | ICD-10-CM | POA: Diagnosis not present

## 2019-03-28 DIAGNOSIS — R4182 Altered mental status, unspecified: Secondary | ICD-10-CM | POA: Diagnosis not present

## 2019-03-28 DIAGNOSIS — R059 Cough, unspecified: Secondary | ICD-10-CM

## 2019-03-28 DIAGNOSIS — R4189 Other symptoms and signs involving cognitive functions and awareness: Secondary | ICD-10-CM | POA: Insufficient documentation

## 2019-03-28 DIAGNOSIS — Z79899 Other long term (current) drug therapy: Secondary | ICD-10-CM | POA: Diagnosis not present

## 2019-03-28 DIAGNOSIS — Z7982 Long term (current) use of aspirin: Secondary | ICD-10-CM | POA: Insufficient documentation

## 2019-03-28 DIAGNOSIS — R21 Rash and other nonspecific skin eruption: Secondary | ICD-10-CM | POA: Insufficient documentation

## 2019-03-28 DIAGNOSIS — I2510 Atherosclerotic heart disease of native coronary artery without angina pectoris: Secondary | ICD-10-CM | POA: Diagnosis not present

## 2019-03-28 DIAGNOSIS — F17210 Nicotine dependence, cigarettes, uncomplicated: Secondary | ICD-10-CM | POA: Insufficient documentation

## 2019-03-28 LAB — URINALYSIS, ROUTINE W REFLEX MICROSCOPIC
Bilirubin Urine: NEGATIVE
Glucose, UA: NEGATIVE mg/dL
Hgb urine dipstick: NEGATIVE
Ketones, ur: NEGATIVE mg/dL
Nitrite: NEGATIVE
Protein, ur: NEGATIVE mg/dL
Specific Gravity, Urine: 1.004 — ABNORMAL LOW (ref 1.005–1.030)
pH: 7 (ref 5.0–8.0)

## 2019-03-28 LAB — CBC WITH DIFFERENTIAL/PLATELET
Abs Immature Granulocytes: 0.05 10*3/uL (ref 0.00–0.07)
Basophils Absolute: 0 10*3/uL (ref 0.0–0.1)
Basophils Relative: 0 %
Eosinophils Absolute: 0.1 10*3/uL (ref 0.0–0.5)
Eosinophils Relative: 1 %
HCT: 37.3 % (ref 36.0–46.0)
Hemoglobin: 12.6 g/dL (ref 12.0–15.0)
Immature Granulocytes: 1 %
Lymphocytes Relative: 7 %
Lymphs Abs: 0.4 10*3/uL — ABNORMAL LOW (ref 0.7–4.0)
MCH: 34.6 pg — ABNORMAL HIGH (ref 26.0–34.0)
MCHC: 33.8 g/dL (ref 30.0–36.0)
MCV: 102.5 fL — ABNORMAL HIGH (ref 80.0–100.0)
Monocytes Absolute: 0.5 10*3/uL (ref 0.1–1.0)
Monocytes Relative: 8 %
Neutro Abs: 5.7 10*3/uL (ref 1.7–7.7)
Neutrophils Relative %: 83 %
Platelets: 146 10*3/uL — ABNORMAL LOW (ref 150–400)
RBC: 3.64 MIL/uL — ABNORMAL LOW (ref 3.87–5.11)
RDW: 13.3 % (ref 11.5–15.5)
WBC: 6.8 10*3/uL (ref 4.0–10.5)
nRBC: 0 % (ref 0.0–0.2)

## 2019-03-28 LAB — BASIC METABOLIC PANEL
Anion gap: 10 (ref 5–15)
BUN: <5 mg/dL — ABNORMAL LOW (ref 8–23)
CO2: 28 mmol/L (ref 22–32)
Calcium: 8.8 mg/dL — ABNORMAL LOW (ref 8.9–10.3)
Chloride: 93 mmol/L — ABNORMAL LOW (ref 98–111)
Creatinine, Ser: 0.76 mg/dL (ref 0.44–1.00)
GFR calc Af Amer: >60 mL/min (ref >60)
GFR calc non Af Amer: >60 mL/min (ref >60)
Glucose, Bld: 101 mg/dL — ABNORMAL HIGH (ref 70–99)
Potassium: 3.6 mmol/L (ref 3.5–5.1)
Sodium: 131 mmol/L — ABNORMAL LOW (ref 135–145)

## 2019-03-28 LAB — TROPONIN I (HIGH SENSITIVITY)
Troponin I (High Sensitivity): 7 ng/L (ref <18)
Troponin I (High Sensitivity): 8 ng/L (ref <18)

## 2019-03-28 MED ORDER — IOHEXOL 300 MG/ML  SOLN
75.0000 mL | Freq: Once | INTRAMUSCULAR | Status: AC | PRN
  Administered 2019-03-28: 75 mL via INTRAVENOUS

## 2019-03-28 NOTE — ED Notes (Signed)
Pt given coke to drink 

## 2019-03-28 NOTE — ED Notes (Signed)
Pt is on purewick so we can get a urine sample. Pt agreed and Is aware that a UA is needed.

## 2019-03-28 NOTE — ED Provider Notes (Signed)
[facility] EMERGENCY DEPARTMENT Provider Note   CSN: [id number] Arrival date & time: 03/28/19  1059     History   Chief Complaint Chief Complaint  Patient presents with  . Leg Pain    HPI Jocelyn Shelton is a 63 y.o. female with PMHx CAD, HTN, HLD, MI, Rheumatoid arthritis, scleroderma who presents to the ED today for having telemedicine visit with PCP.  Unable to give much history.  She hands me a note that has had complaints on it.  Discussed with daughter who reports that patient has been coughing for quite some time but it is gradually been worsening and affecting her eating.  She reports over the last for 5 days patient has not been able to eat except for a few bites due to coughing so frequently.  Also reports steady decline in patient's cognition quite some time.  She reports that they were told to come to the ED for further evaluation.  There was some concern for COVID-19 although daughter denies any known exposure. She is poor historian and cannot give much more information.  Triage report patient had initially come in complaining of left leg pain for months.  She has a history of scleroderma and has had skin manifestations that she reports are painful. Daughter reports that pt has been having intermittent rash for "Awhile" that will typically go away on its own.          Past Medical History:  Diagnosis Date  . Avascular necrosis of humeral head, left ([place]) 06/17/2017  . Bronchiectasis ([place]) 06/17/2017  . CAD in native artery 05/23/2015  . High cholesterol   . Hypertension   . Leg pain    ABIs 1/18: R 1.2, L 1.1  . Myocardial infarction ([place]) 04/2015  . Reflux   . Rheumatoid arthritis ([place])    "all over" (06/16/2017)  . Scleroderma [facility])     Patient Active Problem List   Diagnosis Date Noted  . Polypharmacy 09/08/2018  . Centrilobular emphysema ([place]) 07/29/2017  . ILD (interstitial lung disease) ([place]) 07/29/2017  . Hypoalbuminemia due to  protein-calorie malnutrition ([place]) 06/17/2017  . Leg pain, diffuse 06/17/2017  . Dilation of esophagus 06/17/2017  . Bronchiectasis ([place]) 06/17/2017  . Avascular necrosis of humeral head, right ([place]) 06/17/2017  . Pulmonary hypertension ([place]), Possible 06/17/2017  . Coronary artery disease involving native coronary artery of native heart without angina pectoris   . Hyponatremia 03/16/2017  . Foot ulcer, right ([place])   . Scleroderma ([place]) 03/08/2016  . Immunosuppressed status ([place])   . COPD (chronic obstructive pulmonary disease) ([place]) 08/30/2015  . Allergic rhinitis 12/03/2014  . Rheumatoid arthritis ([place]) 03/14/2013  . Other pancytopenia ([place]) 03/01/2013  . 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 Erythema nodosum 04/27/2011  . GERD (gastroesophageal reflux disease) 08/02/2008  . Callus of foot 03/18/2007  . Major depressive disorder, recurrent episode ([place]) 09/23/2006  . Tobacco use disorder 09/23/2006  . Primary hypertension 09/23/2006  . Raynauds syndrome 09/23/2006    Past Surgical History:  Procedure Laterality Date  . APPENDECTOMY  90's  . CARDIAC CATHETERIZATION N/A 05/22/2015   Procedure: Left Heart Cath and Coronary Angiography;  Surgeon: Troy Sine, MD;  Location: [facility] CV LAB;  Service: Cardiovascular;  Laterality: N/A;  . CARDIAC CATHETERIZATION N/A 05/22/2015   Procedure: Coronary Stent Intervention;  Surgeon: Troy Sine, MD;  Location: [facility] CV LAB;  Service: Cardiovascular;  Laterality: N/A;     OB History   No  obstetric history on file.      Home Medications    Prior to Admission medications   Medication Sig Start Date End Date Taking? Authorizing Provider  acetaminophen (TYLENOL) 500 MG tablet Take 1,000 mg by mouth daily as needed for pain.     [provider]  albuterol (PROVENTIL HFA) 108 (90 Base) MCG/ACT inhaler INHALE TWO PUFFS BY MOUTH EVERY 4 HOURS AS NEEDED FOR WHEEZING FOR COUGH FOR SHORTNESS OF BREATH  05/26/17   Wendee Beavers, DO  aspirin EC 81 MG EC tablet Take 1 tablet (81 mg total) by mouth daily. 05/24/15   Barrett, Joline Salt, PA-C  atorvastatin (LIPITOR) 40 MG tablet TAKE 1 TABLET BY MOUTH EVERY DAY AT [facility] 05/10/18   Pricilla Riffle, MD  Black Cohosh 40 MG CAPS Take 1 capsule by mouth daily.     [provider]  Calcium Carb-Cholecalciferol (CALCIUM 600+D3 PO) Take 1 tablet by mouth daily.    [provider]  carvedilol (COREG) 25 MG tablet TAKE 1 TABLET (25 MG TOTAL) BY MOUTH 2 (TWO) TIMES DAILY WITH A MEAL. 12/12/18   Wendee Beavers, DO  clopidogrel (PLAVIX) 75 MG tablet Take 1 tablet (75 mg total) by mouth daily. 01/18/19   Wendee Beavers, DO  dorzolamide-timolol (COSOPT) 22.3-6.8 MG/ML ophthalmic solution Place 1 drop into both eyes 2 (two) times daily.    [provider]  fluticasone (FLONASE) 50 MCG/ACT nasal spray USE TWO SPRAY(S) IN EACH NOSTRIL DAILY AS NEEDED FOR  RHINITIS 07/25/15   Althea Charon, Netta Neat, DO  folic acid (FOLVITE) 1 MG tablet TAKE 1 TABLET (1 MG TOTAL) BY MOUTH DAILY. Patient taking differently: 800 mcg daily.  01/22/15   Karamalegos, Netta Neat, DO  hydrochlorothiazide (HYDRODIURIL) 25 MG tablet TAKE 1/2 TABLET BY MOUTH EVERY DAY 03/14/19   Mullis, Kiersten P, DO  LUMIGAN 0.01 % SOLN INSTILL 1 DROP INTO BOTH EYES EVERY EVENING 06/14/18   [provider]  methotrexate (RHEUMATREX) 2.5 MG tablet TAKE 4 TABLETS (10MG ) BY MOUTH 2 TIMES A WEEK EVERY MONDAY AND TUESDAY Patient taking differently: TAKE 4 TABLETS (10MG ) BY MOUTH 1 TIMES A WEEK EVERY MONDAY 07/23/17   Wendee Beavers, DO  Multiple Vitamin (MULTIVITAMIN WITH MINERALS) TABS tablet Take 1 tablet by mouth daily.    [provider]  nitroGLYCERIN (NITROSTAT) 0.4 MG SL tablet PLACE 1 TABLET UNDER THE TONGUE EVERY 5 MINUTES AS NEEDED FOR CHEST PAIN 06/23/17   Barrett, Joline Salt, PA-C  OLANZapine (ZYPREXA) 5 MG tablet TAKE 1 TABLET BY MOUTH EVERYDAY AT BEDTIME  03/14/19   Mullis, Kiersten P, DO  predniSONE (DELTASONE) 1 MG tablet TAKE 3 TABLETS BY MOUTH EVERY DAY 06/27/18   [provider]  sulfaSALAzine (AZULFIDINE) 500 MG tablet Take 2 tablets (1,000 mg total) by mouth 2 (two) times daily. 05/26/17   Wendee Beavers, DO  Tiotropium Bromide-Olodaterol (STIOLTO RESPIMAT) 2.5-2.5 MCG/ACT AERS Inhale into the lungs. 06/27/18   [provider]  valsartan (DIOVAN) 160 MG tablet Take 1 tablet (160 mg total) by mouth daily. 04/25/18   Pricilla Riffle, MD  vitamin B-12 (CYANOCOBALAMIN) 500 MCG tablet Take 500 mcg by mouth daily.    [provider]    Family History Family History  Problem Relation Age of Onset  . Hypertension Father   . Heart attack Mother   . Diabetes Daughter   . Diabetes Sister   . Lung cancer Brother 97    Social History Social History  Tobacco Use  . Smoking status: Current Every Day Smoker    Packs/day: 0.50    Years: 46.00    Pack years: 23.00    Types: Cigarettes  . Smokeless tobacco: Never Used  Substance Use Topics  . Alcohol use: Yes    Alcohol/week: 4.0 standard drinks    Types: 4 Standard drinks or equivalent per week  . Drug use: No     Allergies   Ticagrelor, Cymbalta [duloxetine hcl], Lisinopril, and Tomato   Review of Systems Review of Systems  Constitutional: Negative for chills and fever.  HENT: Negative for congestion.   Eyes: Negative for visual disturbance.  Respiratory: Positive for cough.   Cardiovascular: Negative for chest pain.  Gastrointestinal: Negative for abdominal pain, nausea and vomiting.  Genitourinary: Negative for difficulty urinating.  Musculoskeletal: Negative for myalgias.  Skin: Positive for rash.  Neurological: Negative for headaches.     Physical Exam Updated Vital Signs BP (!) 171/99   Pulse 97   Temp 98.7 F (37.1 C) (Oral)   Resp 18   SpO2 99%   Physical Exam Vitals signs and nursing note reviewed.  Constitutional:       Appearance: She is not ill-appearing.  HENT:     Head: Normocephalic and atraumatic.  Eyes:     Extraocular Movements: Extraocular movements intact.     Conjunctiva/sclera: Conjunctivae normal.     Pupils: Pupils are equal, round, and reactive to light.  Neck:     Musculoskeletal: Neck supple.  Cardiovascular:     Rate and Rhythm: Normal rate and regular rhythm.  Pulmonary:     Effort: Pulmonary effort is normal.     Breath sounds: Normal breath sounds. No wheezing, rhonchi or rales.     Comments: Actively coughing in room.  Satting 99% on room air.  No accessory muscle use. Abdominal:     Palpations: Abdomen is soft.     Tenderness: There is no abdominal tenderness. There is no guarding or rebound.  Musculoskeletal:     Right lower leg: No edema.     Left lower leg: No edema.  Skin:    General: Skin is warm and dry.  Neurological:     Mental Status: She is alert.     Comments: CN 3-12 grossly intact A&O x4 GCS 15 Sensation and strength intact Coordination with finger-to-nose WNL Neg pronator drift       ED Treatments / Results  Labs (all labs ordered are listed, but only abnormal results are displayed) Labs Reviewed  CBC WITH DIFFERENTIAL/PLATELET - Abnormal; Notable for the following components:      Result Value   RBC 3.64 (*)    MCV 102.5 (*)    MCH 34.6 (*)    Platelets 146 (*)    Lymphs Abs 0.4 (*)    All other components within normal limits  BASIC METABOLIC PANEL - Abnormal; Notable for the following components:   Sodium 131 (*)    Chloride 93 (*)    Glucose, Bld 101 (*)    BUN <5 (*)    Calcium 8.8 (*)    All other components within normal limits  URINALYSIS, ROUTINE W REFLEX MICROSCOPIC  TROPONIN I (HIGH SENSITIVITY)  TROPONIN I (HIGH SENSITIVITY)    EKG EKG Interpretation  Date/Time:  Tuesday March 28 2019 14:05:40 EDT Ventricular Rate:  86 PR Interval:    QRS Duration: 88 QT Interval:  388 QTC Calculation: 465 R Axis:   90 Text  Interpretation:  Sinus rhythm  Borderline right axis deviation When compared to prior, no significant cahnges seen.  No STEMI Confirmed by Theda Belfast ([id number]) on 03/28/2019 2:31:34 PM   Radiology Ct Head Wo Contrast  Result Date: 03/28/2019 CLINICAL DATA:  Altered level of consciousness. EXAM: CT HEAD WITHOUT CONTRAST TECHNIQUE: Contiguous axial images were obtained from the base of the skull through the vertex without intravenous contrast. COMPARISON:  CT head 03/27/2018 FINDINGS: Brain: Ventricle size normal. Cerebral volume normal for age. Mild chronic white matter changes stable. Negative for acute infarct.  Negative for hemorrhage or mass. Vascular: Normal arterial flow voids Skull: Negative Sinuses/Orbits: Mild mucosal edema left ethmoid sinus. Otherwise clear. Negative orbit. Other: None IMPRESSION: No acute intracranial abnormality. Mild chronic microvascular ischemic change in the white matter. Electronically Signed   By: Marlan Palau M.D.   On: 03/28/2019 15:02   Dg Chest Port 1 View  Result Date: 03/28/2019 CLINICAL DATA:  Pt c/o cough and leg pain x 1 day. Hx of MI, CAD, AND bronchiectasis. Pt Is a current smoker. Patient did not wear a mask. Tech wore droplet mask and face shield. EXAM: PORTABLE CHEST 1 VIEW COMPARISON:  Chest radiograph 06/16/2017 FINDINGS: Stable cardiomediastinal contours are within normal limits. There are bibasilar reticular opacities which were present on prior CT from 06/17/2017, possibly representing interstitial lung disease. This appears to have progressed in the interim. No definite new focal infiltrate. No pneumothorax or pleural effusion. Visualized skeletal structures are unremarkable. IMPRESSION: Interval increase in bibasilar reticular opacities as seen on prior chest CT from 2018, possibly representing interstitial lung disease. Findings may represent progression of chronic disease though difficult to exclude acute component. Electronically Signed   By: Emmaline Kluver M.D.   On: 03/28/2019 14:14    Procedures Procedures (including critical care time)  Medications Ordered in ED Medications - No data to display   Initial Impression / Assessment and Plan / ED Course  I have reviewed the triage vital signs and the nursing notes.  Pertinent labs & imaging results that were available during my care of the patient were reviewed by me and considered in my medical decision making (see chart for details).    Is a 63 year old female who presents to the ED today for further evaluation after being seen at primary care physician's office.  Patient is poor historian and not able to give much information.  She initially handed me a note at the beginning of assessment that has multiple complaints on including cough, difficulty eating, rash.  Spoke with daughter on the phone who reports that patient has been coughing for quite a while but it has worsened recently.  She states that it has been affecting her eating due to the amount of coughing.  She also reports a cognitive decline recently.  Obtain baseline screening labs including chest x-ray, CT head.   For review patient was recently started on fluconazole for Candida esophagitis.  When speaking with daughter she reports that they were not aware of this and she does not think they have that medication.  She will call pharmacy as well as gastroenterology to follow-up on this further.  Question of this is why patient has not been eating as well.  Awaiting swallow screen.   X-ray does show some interstitial lung disease.  Cannot exclude acute process today.  Will obtain CT chest at this time.  Patient does have history of interstitial lung disease.  CT head negative for any acute process.  Does show some microvascular changes.  No leukocytosis today.  Awaiting remainder of labs.  3:48 PM At shift change case signed out to Claude MangesJohana Soto, [place]PA-C, who will dispo patient accordingly once labs and imaging return. If all  negative patient can return home with PCP follow up.          Final Clinical Impressions(s) / ED Diagnoses   Final diagnoses:  Cough  Interstitial lung disease [facility](HCC)    ED Discharge Orders    None       Tanda RockersVenter, Carvin Almas, PA-C 03/28/19 1623    Tegeler, Canary Brimhristopher J, MD 03/28/19 [phone]

## 2019-03-28 NOTE — ED Provider Notes (Addendum)
  Physical Exam  BP (!) 169/95   Pulse 90   Temp 98.7 F (37.1 C) (Oral)   Resp (!) 22   SpO2 98%   Physical Exam  ED Course/Procedures     Procedures  MDM   Patient care received from [place] PA at shift change, please see her note for full HPI.  Briefly, patient with many complaints, history was obtained from daughter who reports patient has been coughing a whole lot while at home, she also reports a steady decline in patient's cognitive.She is currently pending BMP, CT chest, swallow screen.  Patient has been having issues with oral intake, according to daughter she proceeds to cough and this makes difficult for patient to swallow. CBC at baseline.  BMP shows slight hyponatremia, no other electrolyte abnormality.  Creatinine level is within normal limits.  Troponin was negative.  Second troponin is currently in process.  UA is currently pending.  7:52 PM Spoke to daughter who I discussed results with, she is agreeable to pick up patient after she is discharged. Pending UA at this time.   Patient has been drinking appropriately, no episodes of vomiting here, vital signs have been within normal limits.  She is currently pending UA prior to disposition home.  Encouraged to daughter that she will need to follow-up for a swallow screening and likely a motility study.  CT Chest w/ contrast: 1. No acute abnormality. No evidence of airspace disease/pneumonia.  2. 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  3. Question nonspecific esophageal motility disorder.  4. Mild cardiomegaly and coronary artery disease.  5. Hepatic steatosis  6. Aortic Atherosclerosis (ICD10-I70.0).     UA showed large leukocytes, many bacteria.  This will be send out for culture, dysuria sample was obtained via pure wick.  She does not voice any urinary complaints today.  Will now call daughter to pick up patient prior to discharge.  Portions of this note were generated with  Lobbyist. Dictation errors may occur despite best attempts at proofreading.         Janeece Fitting, PA-C 03/28/19 2025    Janeece Fitting, PA-C 03/28/19 2026    Charlesetta Shanks, MD 04/02/19 [phone]

## 2019-03-28 NOTE — Discharge Instructions (Addendum)
Please follow up with your PCP regarding your symptoms today.

## 2019-03-28 NOTE — Progress Notes (Signed)
Virtual Visit via Video Note  I connected with Jocelyn Shelton on 03/28/19 at  9:30 AM EDT by a video enabled telemedicine application and verified that I am speaking with the correct person using two identifiers.  Location: Patient: at home with daughter Provider: [facility] clinic   I discussed the limitations of evaluation and management by telemedicine and the availability of in person appointments. The patient expressed understanding and agreed to proceed.  History of Present Illness: Patient with significant pulmonary problems as well as scleroderma, she has been having increased coughing above baseline for the last 2 weeks.  There has been some subjective fever symptoms although no objective was reported.  She is also been having significant trouble swallowing and increased cough at baseline as well as particularly around times when she is swallowing.  Daughter reports that she has been unable to eat reliably for the last 4 days, and is at most gotten "a mouthful "down at every meal.  She is beginning to get weak   Observations/Objective: Patient was able to speak in short sentences and was coughing significantly both while trying to speak to me and in the background while her daughter was giving more information.  She does appear tired but alert.  Assessment and Plan: We had discussed that just having a cough would not necessarily be reason to come to the emergency department for evaluation, but given the report of being unable to eat reliably for 4 days there is a concern about this patient being able to sustain herself with oral hydration.  Patient with scleroderma also has increased risk of dysphasia and need to be evaluated which we are unable to do in the clinic given her respiratory symptoms and the coronavirus outbreak at this time.  Follow Up Instructions: Suggest patient goes to the ED for evaluation given her claim of being unable to eat/drink enough to sustain herself.   I discussed  the assessment and treatment plan with the patient. The patient was provided an opportunity to ask questions and all were answered. The patient agreed with the plan and demonstrated an understanding of the instructions.   The patient was advised to call back or seek an in-person evaluation if the symptoms worsen or if the condition fails to improve as anticipated.  I provided 15 minutes of non-face-to-face time during this encounter.   Sherene Sires, DO

## 2019-03-28 NOTE — ED Notes (Signed)
Pt here for a chronic cough and scleroderma that is bothering her. Pt reports her PCP will not see her because of the [place] pandemic.

## 2019-03-28 NOTE — ED Triage Notes (Signed)
Patient reports that she has sclera derma and left leg pain for months. Patient alert and oriented, denies hitting leg. Hx of same

## 2019-03-29 ENCOUNTER — Other Ambulatory Visit: Payer: Self-pay

## 2019-03-29 DIAGNOSIS — I10 Essential (primary) hypertension: Secondary | ICD-10-CM

## 2019-03-29 LAB — URINE CULTURE

## 2019-03-29 MED ORDER — CARVEDILOL 25 MG PO TABS: 25.0000 mg | ORAL_TABLET | Freq: Two times a day (BID) | ORAL | 3 refills | Status: DC

## 2019-04-12 ENCOUNTER — Other Ambulatory Visit: Payer: Self-pay | Admitting: Family Medicine

## 2019-04-12 DIAGNOSIS — Z1231 Encounter for screening mammogram for malignant neoplasm of breast: Secondary | ICD-10-CM

## 2019-04-14 ENCOUNTER — Encounter: Payer: Self-pay | Admitting: Internal Medicine

## 2019-04-14 ENCOUNTER — Other Ambulatory Visit: Payer: Self-pay

## 2019-04-14 ENCOUNTER — Ambulatory Visit (INDEPENDENT_AMBULATORY_CARE_PROVIDER_SITE_OTHER): Payer: Medicaid Other | Admitting: Internal Medicine

## 2019-04-14 VITALS — BP 176/106 | HR 98 | Ht 66.0 in | Wt 166.0 lb

## 2019-04-14 DIAGNOSIS — E782 Mixed hyperlipidemia: Secondary | ICD-10-CM

## 2019-04-14 DIAGNOSIS — I2510 Atherosclerotic heart disease of native coronary artery without angina pectoris: Secondary | ICD-10-CM | POA: Diagnosis not present

## 2019-04-14 LAB — BASIC METABOLIC PANEL
BUN/Creatinine Ratio: 5 — ABNORMAL LOW (ref 12–28)
BUN: 5 mg/dL — ABNORMAL LOW (ref 8–27)
CO2: 27 mmol/L (ref 20–29)
Calcium: 9.5 mg/dL (ref 8.7–10.3)
Chloride: 94 mmol/L — ABNORMAL LOW (ref 96–106)
Creatinine, Ser: 0.97 mg/dL (ref 0.57–1.00)
GFR calc Af Amer: 71 mL/min/{1.73_m2} (ref >59)
GFR calc non Af Amer: 62 mL/min/{1.73_m2} (ref >59)
Glucose: 110 mg/dL — ABNORMAL HIGH (ref 65–99)
Potassium: 3.5 mmol/L (ref 3.5–5.2)
Sodium: 137 mmol/L (ref 134–144)

## 2019-04-14 LAB — LIPID PANEL
Chol/HDL Ratio: 3 ratio (ref 0.0–4.4)
Cholesterol, Total: 113 mg/dL (ref 100–199)
HDL: 38 mg/dL — ABNORMAL LOW (ref >39)
LDL Chol Calc (NIH): 51 mg/dL (ref 0–99)
Triglycerides: 138 mg/dL (ref 0–149)
VLDL Cholesterol Cal: 24 mg/dL (ref 5–40)

## 2019-04-14 MED ORDER — NITROGLYCERIN 0.4 MG SL SUBL: SUBLINGUAL_TABLET | SUBLINGUAL | 5 refills | Status: DC

## 2019-04-14 NOTE — Patient Instructions (Addendum)
Medication Inst.instcructions:  Your physician recommends that you continue on your current medications as directed. Please refer to the Current Medication list given to you today.  If you need a refill on your cardiac medications before your next appointment, please call your pharmacy.   Lab work: Your physician recommends that you have labs drawn today: Lipids BMet   Testing/Procedures: None Ordered  Follow-Up:  Your physician recommends that you schedule a follow-up appointment in:1 month with Pharm D in Hypertension Clinic  At [facility], you and your health needs are our priority.  As part of our continuing mission to provide you with exceptional heart care, we have created designated Provider Care Teams.  These Care Teams include your primary Cardiologist (physician) and Advanced Practice Providers (APPs -  Physician Assistants and Nurse Practitioners) who all work together to provide you with the care you need, when you need it. You will need a follow up appointment in:  6 months.  Please call our office 2 months in advance to schedule this appointment.  You may see Dorris Carnes, MD or one of the following Advanced Practice Providers on your designated Care Team: Richardson Dopp, PA-C [place], [place] . Daune Perch, NP  Any Other Special Instructions Will Be Listed Below (If Applicable). Follow up with your Pulmonologist and Dermatologist

## 2019-04-14 NOTE — Progress Notes (Signed)
Cardiology Office Note   Date:  04/14/2019   ID:  Jocelyn Shelton, DOB 09-08-1955, MRN [id number]  PCP:  Jocelyn Reamer, DO  Cardiologist:   Jocelyn Pates, MD   F/U of CAD     History of Present Illness: Jocelyn Shelton is a 63 y.o. female with a history of CAD (s/p NSTEMI in 2016:  Cath pt underwent PCI/DES to P RCA; PCI/DES to LCx)  Also a history of HTN, SOB, hyponatremia   She was admitted in Auguust 2018  Stress test showed no ischemia  She was admitted in late 2018 with CP and syncope  CP wose with coughing   D D[place] elev but CT neg for PE   Syncope was felt to be orthostatic    Echo showed normal LVEF   No signif valve abnormalities    I saw the pt as a televisit in May 2020    The pt was seen in [facility] ED for leg pain on 9.1.20  Noted some coughing  Appetite down    The pt continues to smoke  Notes some SOB   Denies CP She says her legs are still  bothering her  Did not take meds today    Current Meds  Medication Sig  . acetaminophen (TYLENOL) 500 MG tablet Take 1,000 mg by mouth daily as needed for pain.   Marland Kitchen albuterol (PROVENTIL HFA) 108 (90 Base) MCG/ACT inhaler INHALE TWO PUFFS BY MOUTH EVERY 4 HOURS AS NEEDED FOR WHEEZING FOR COUGH FOR SHORTNESS OF BREATH  . aspirin EC 81 MG EC tablet Take 1 tablet (81 mg total) by mouth daily.  Marland Kitchen atorvastatin (LIPITOR) 40 MG tablet TAKE 1 TABLET BY MOUTH EVERY DAY AT 6PM  . Black Cohosh 40 MG CAPS Take 1 capsule by mouth daily.   . Calcium Carb-Cholecalciferol (CALCIUM 600+D3 PO) Take 1 tablet by mouth daily.  . carvedilol (COREG) 25 MG tablet Take 1 tablet (25 mg total) by mouth 2 (two) times daily with a meal.  . clopidogrel (PLAVIX) 75 MG tablet Take 1 tablet (75 mg total) by mouth daily.  . dorzolamide-timolol (COSOPT) 22.3-6.8 MG/ML ophthalmic solution Place 1 drop into both eyes 2 (two) times daily.  . fluticasone (FLONASE) 50 MCG/ACT nasal spray USE TWO SPRAY(S) IN EACH NOSTRIL DAILY AS NEEDED FOR  RHINITIS  . folic  acid (FOLVITE) 1 MG tablet TAKE 1 TABLET (1 MG TOTAL) BY MOUTH DAILY. (Patient taking differently: 800 mcg daily. )  . hydrochlorothiazide (HYDRODIURIL) 25 MG tablet TAKE 1/2 TABLET BY MOUTH EVERY DAY  . LUMIGAN 0.01 % SOLN INSTILL 1 DROP INTO BOTH EYES EVERY EVENING  . methotrexate (RHEUMATREX) 2.5 MG tablet TAKE 4 TABLETS (10MG ) BY MOUTH 2 TIMES A WEEK EVERY MONDAY AND TUESDAY (Patient taking differently: TAKE 4 TABLETS (10MG ) BY MOUTH 1 TIMES A WEEK EVERY MONDAY)  . Multiple Vitamin (MULTIVITAMIN WITH MINERALS) TABS tablet Take 1 tablet by mouth daily.  . nitroGLYCERIN (NITROSTAT) 0.4 MG SL tablet PLACE 1 TABLET UNDER THE TONGUE EVERY 5 MINUTES AS NEEDED FOR CHEST PAIN  . OLANZapine (ZYPREXA) 5 MG tablet TAKE 1 TABLET BY MOUTH EVERYDAY AT BEDTIME  . predniSONE (DELTASONE) 1 MG tablet TAKE 3 TABLETS BY MOUTH EVERY DAY  . sulfaSALAzine (AZULFIDINE) 500 MG tablet Take 2 tablets (1,000 mg total) by mouth 2 (two) times daily.  . Tiotropium Bromide-Olodaterol (STIOLTO RESPIMAT) 2.5-2.5 MCG/ACT AERS Inhale into the lungs.  . valsartan (DIOVAN) 160 MG tablet TAKE 1 TABLET BY MOUTH  EVERY DAY  . vitamin B-12 (CYANOCOBALAMIN) 500 MCG tablet Take 500 mcg by mouth daily.     Allergies:   Ticagrelor, Cymbalta [duloxetine hcl], Lisinopril, and Tomato   Past Medical History:  Diagnosis Date  . Avascular necrosis of humeral head, left ([place]) 06/17/2017  . Bronchiectasis ([place]) 06/17/2017  . CAD in native artery 05/23/2015  . High cholesterol   . Hypertension   . Leg pain    ABIs 1/18: R 1.2, L 1.1  . Myocardial infarction ([place]) 04/2015  . Reflux   . Rheumatoid arthritis ([place])    "all over" (06/16/2017)  . Scleroderma [facility])     Past Surgical History:  Procedure Laterality Date  . APPENDECTOMY  90's  . CARDIAC CATHETERIZATION N/A 05/22/2015   Procedure: Left Heart Cath and Coronary Angiography;  Surgeon: Troy Sine, MD;  Location: [place] [facility];  Service: Cardiovascular;  Laterality: N/A;   . CARDIAC CATHETERIZATION N/A 05/22/2015   Procedure: Coronary Stent Intervention;  Surgeon: Troy Sine, MD;  Location: [place] [facility];  Service: Cardiovascular;  Laterality: N/A;     Social History:  The patient  reports that she has been smoking cigarettes. She has a 23.00 pack-year smoking history. She has never used smokeless tobacco. She reports current alcohol use of about 4.0 standard drinks of alcohol per week. She reports that she does not use drugs.   Family History:  The patient's family history includes Diabetes in her daughter and sister; Heart attack in her mother; Hypertension in her father; Lung cancer (age of onset: 13) in her brother.    ROS:  Please see the history of present illness. All other systems are reviewed and  Negative to the above problem except as noted.    PHYSICAL EXAM: VS:  BP (!) 176/106   Pulse 98   Ht 5\' 6"  (1.676 m)   Wt 166 lb (75.3 kg)   BMI 26.79 kg/m   GEN: Well nourished, well developed, in no acute distress  HEENT: normal  Neck:  JVP is normal    No , carotid bruits  Cardiac: RRR; no murmurs, rubs, or gallops,no edema  Respiratory  Wheezes and pops bilaterally  GI: soft, nontender, nondistended, + BS  No hepatomegaly  MS: no deformity Moving all extremities   Skin: warm and dry, Erythema L shin with rash  Tender   Neuro:  Strength and sensation are intact Psych: euthymic mood, full affect   EKG:  EKG is not ordered today.   Lipid Panel    Component Value Date/Time   CHOL 109 04/25/2018 1034   TRIG 62 04/25/2018 1034   HDL 54 04/25/2018 1034   CHOLHDL 2.0 04/25/2018 1034   CHOLHDL 2.5 06/17/2017 0559   VLDL 8 06/17/2017 0559   LDLCALC 43 04/25/2018 1034      Wt Readings from Last 3 Encounters:  04/14/19 166 lb (75.3 kg)  12/01/18 160 lb (72.6 kg)  09/08/18 167 lb 12.8 oz (76.1 kg)      ASSESSMENT AND PLAN:  1  CAD    No symptoms of angina   Keep on same meds    2  HTN BP is high today   It has been up and  down   She says she has not taken meds yet today   I told her that she needs to take when she first gets up  I am worried that she will continue to fall through cracks with BP control  Sees many doctors Will refer to HTN clinic so they can review and emphasize with her the importance of BP control  Told her to bring meds to clinic   3  HL  Lipids Will get lipids today    4   Hyponatremia   Hx of  Check BMET   5   Pulmonary   Pt with some wheezing, pops today   Moving air   Has inhalers   Continues to smoke  Told her she needs to quit     6  Skin   Pt should be seen in derm for L Leg    F/U in march 2021  Current medicines are reviewed at length with the patient today.  The patient does not have concerns regarding medicines.  Signed, Jocelyn Pates, MD  04/14/2019 10:10 AM    [facility] [address] [place], [place], [place]  [address] Phone: [phone]; Fax: [phone]

## 2019-05-16 ENCOUNTER — Ambulatory Visit: Payer: Medicaid Other

## 2019-05-17 NOTE — Progress Notes (Signed)
Patient ID: Jocelyn Shelton                 DOB: May 02, 1956                      MRN: [id number]     HPI: Jocelyn Shelton is a 63 y.o. female referred by Dr. Harrington Challenger to HTN clinic. PMH is significant for CAD (s/p NSTEMI in 2016), PCI/DES to P RCA and LCx, HLD, HTN, SOB, hyponatremia (Na 137 - 04/14/19).   Patient was last seen by Dr. Harrington Challenger on 04/14/2019. Clinic BP was elevated at 176/106 mmHg and patient did not take her BP meds before the visit. No med changes were made and patient was continued on Carvedilol 25 mg BID, HCTZ 12.5 mg daily, and Valsartan 160 mg daily. Patient was instructed to bring meds to next HTN follow-up visit. Dr. Harrington Challenger was concerned patient will continue to fall through the cracks with BP control and was referred to the HTN clinic for BP management.  Patient presents today to the HTN clinic in good spirits, ambulating with a cane. Initial clinic BP was elevated at 181/101 mmHg and remained elevated towards the end of the visit at 185/106 mmHg. Patient brought her BP meds this morning and reports taking meds every day - Valsartan 160 mg daily in the morning, HCTZ 12.5 mg daily in the morning and Carvedilol 25 mg BID in the evening. Patient reports that she only took Valsartan and HCTZ this morning before today's clinic visit and normally takes 2 tablets of Carvedilol at the same time in the evening. Patient denies dizziness, headaches, and blurred vision. Patient reports having swelling in the lower extremities in the past, but has not experienced any swelling recently. Patient has tried amlodipine in the past but cannot recall why med was discontinued. Patient did not bring home BP logs, however reports that she checks her BP 1-2 times a week but cannot recall the readings. Patient's diet consist of vegetables, steak, soups, wheat bread, bacon, eggs, oatmeal, sodas, beer, and water. Patient walks with a cane and has difficulty walking for long periods of time.  Current HTN meds: Carvedilol  25 mg BID, HCTZ 12.5 mg daily, and Valsartan 160 mg daily Previously tried: Lisinopril 10 mg daily (cough), Losartan 50 mg daily, amlodipine 2.5 mg daily (pt cannot recall), Metoprolol tartrate 50 mg BID, Maxzide 75-50 mg 0.5 mg daily, BP goal: < 130 mg/dL  Family History: Diabetes in her daughter and sister; Heart attack in her mother; Hypertension in her father; Lung cancer (age of onset: 51) in her brother.   Social History: The patient reports that she has been smoking cigarettes. She has a 23.00 pack-year smoking history. She has never used smokeless tobacco. She reports current alcohol use of about 4.0 standard drinks of alcohol per week. She reports that she does not use drugs.   Diet: vegetables, steak, soups, wheat bread, bacon, eggs, oatmeal, sodas, beers, and water.  Exercise: Patient walks with a cane and has difficulty walking for long periods of time  Wt Readings from Last 3 Encounters:  04/14/19 166 lb (75.3 kg)  12/01/18 160 lb (72.6 kg)  09/08/18 167 lb 12.8 oz (76.1 kg)   BP Readings from Last 3 Encounters:  04/14/19 (!) 176/106  03/28/19 (!) 162/97  12/01/18 (!) 180/121   Pulse Readings from Last 3 Encounters:  04/14/19 98  03/28/19 85  12/01/18 (!) 102    Renal function: CrCl cannot be  calculated (Patient's most recent lab result is older than the maximum 21 days allowed.).  Past Medical History:  Diagnosis Date  . Avascular necrosis of humeral head, left ([facility]) 06/17/2017  . Bronchiectasis ([facility]) 06/17/2017  . CAD in native artery 05/23/2015  . High cholesterol   . Hypertension   . Leg pain    ABIs 1/18: R 1.2, L 1.1  . Myocardial infarction ([facility]) 04/2015  . Reflux   . Rheumatoid arthritis ([facility])    "all over" (06/16/2017)  . Scleroderma ([facility])     Current Outpatient Medications on File Prior to Visit  Medication Sig Dispense Refill  . acetaminophen (TYLENOL) 500 MG tablet Take 1,000 mg by mouth daily as needed for pain.     Marland Kitchen albuterol (PROVENTIL  HFA) 108 (90 Base) MCG/ACT inhaler INHALE TWO PUFFS BY MOUTH EVERY 4 HOURS AS NEEDED FOR WHEEZING FOR COUGH FOR SHORTNESS OF BREATH 7 each 1  . aspirin EC 81 MG EC tablet Take 1 tablet (81 mg total) by mouth daily.    Marland Kitchen atorvastatin (LIPITOR) 40 MG tablet TAKE 1 TABLET BY MOUTH EVERY DAY AT 6PM 90 tablet 3  . Black Cohosh 40 MG CAPS Take 1 capsule by mouth daily.     . Calcium Carb-Cholecalciferol (CALCIUM 600+D3 PO) Take 1 tablet by mouth daily.    . carvedilol (COREG) 25 MG tablet Take 1 tablet (25 mg total) by mouth 2 (two) times daily with a meal. 60 tablet 3  . clopidogrel (PLAVIX) 75 MG tablet Take 1 tablet (75 mg total) by mouth daily. 90 tablet 3  . dorzolamide-timolol (COSOPT) 22.3-6.8 MG/ML ophthalmic solution Place 1 drop into both eyes 2 (two) times daily.    . fluticasone (FLONASE) 50 MCG/ACT nasal spray USE TWO SPRAY(S) IN EACH NOSTRIL DAILY AS NEEDED FOR  RHINITIS 16 g 2  . folic acid (FOLVITE) 1 MG tablet TAKE 1 TABLET (1 MG TOTAL) BY MOUTH DAILY. (Patient taking differently: 800 mcg daily. ) 30 tablet 6  . hydrochlorothiazide (HYDRODIURIL) 25 MG tablet TAKE 1/2 TABLET BY MOUTH EVERY DAY 15 tablet 5  . LUMIGAN 0.01 % SOLN INSTILL 1 DROP INTO BOTH EYES EVERY EVENING  6  . methotrexate (RHEUMATREX) 2.5 MG tablet TAKE 4 TABLETS (10MG ) BY MOUTH 2 TIMES A WEEK EVERY MONDAY AND TUESDAY (Patient taking differently: TAKE 4 TABLETS (10MG ) BY MOUTH 1 TIMES A WEEK EVERY MONDAY) 32 tablet 0  . Multiple Vitamin (MULTIVITAMIN WITH MINERALS) TABS tablet Take 1 tablet by mouth daily.    . nitroGLYCERIN (NITROSTAT) 0.4 MG SL tablet PLACE 1 TABLET UNDER THE TONGUE EVERY 5 MINUTES AS NEEDED FOR CHEST PAIN 25 tablet 5  . OLANZapine (ZYPREXA) 5 MG tablet TAKE 1 TABLET BY MOUTH EVERYDAY AT BEDTIME 90 tablet 3  . predniSONE (DELTASONE) 1 MG tablet TAKE 3 TABLETS BY MOUTH EVERY DAY    . sulfaSALAzine (AZULFIDINE) 500 MG tablet Take 2 tablets (1,000 mg total) by mouth 2 (two) times daily. 120 tablet 0  .  Tiotropium Bromide-Olodaterol (STIOLTO RESPIMAT) 2.5-2.5 MCG/ACT AERS Inhale into the lungs.    . valsartan (DIOVAN) 160 MG tablet TAKE 1 TABLET BY MOUTH EVERY DAY 90 tablet 2  . vitamin B-12 (CYANOCOBALAMIN) 500 MCG tablet Take 500 mcg by mouth daily.     No current facility-administered medications on file prior to visit.     Allergies  Allergen Reactions  . Ticagrelor Shortness Of Breath  . Cymbalta [Duloxetine Hcl] Diarrhea and Nausea Only    No appetite,  stomach pain  . Lisinopril Cough  . Tomato Rash     Assessment/Plan:  1. Hypertension - BP above goal < 130/80 mmHg. Start taking 2 tablets of Valsartan 160 mg daily in the morning and once bottle is finished, pick up new prescription and start Valsartan 320 mg once daily in the morning. Recommended patient to switch from taking Carvedilol 25 mg twice in the evening to once in the morning and evening. Continue taking 0.5 tablets of Hydrochlorothiazide 25 mg daily in the morning. Discussed the importance of checking BP readings at home and instructed patient to check BP on MWF and bring readings along with BP monitor to next follow-up visit. Encourage patient to eat a low-carb, low-salt diet and continue to exercise. Recommend rechecking BMET in two weeks due to dose increase of Valsartan.   Follow-up in HTN clinic in 2 weeks.  Fabio Neighbors, PharmD PGY1 Ambulatory Care Pharmacy Resident [facility] [address] [place], [place] [address] Phone: [phone]; Fax: [phone]

## 2019-05-18 ENCOUNTER — Other Ambulatory Visit: Payer: Self-pay

## 2019-05-18 ENCOUNTER — Ambulatory Visit (INDEPENDENT_AMBULATORY_CARE_PROVIDER_SITE_OTHER): Payer: Medicaid Other | Admitting: Pharmacist

## 2019-05-18 VITALS — BP 185/106 | HR 83

## 2019-05-18 DIAGNOSIS — I10 Essential (primary) hypertension: Secondary | ICD-10-CM | POA: Diagnosis not present

## 2019-05-18 MED ORDER — VALSARTAN 320 MG PO TABS: 320.0000 mg | ORAL_TABLET | Freq: Every day | ORAL | 3 refills | Status: DC

## 2019-05-18 NOTE — Patient Instructions (Addendum)
Nice to see you today!  Keep up the good work with diet and exercise. Aim for a diet full of vegetables, fruit and lean meats (chicken, [place], fish). Try to limit salt intake by eating fresh or frozen vegetables (instead of canned), rinse canned vegetables prior to cooking and do not add any additional salt to meals.   Your goal blood pressure is < 130/80 mmHg. In clinic, your blood pressure was 185/106 mmHg.  Medication Changes: Start taking Carvedilol 25 mg once in the morning and once in the evening  Start taking 2 tablets of Valsartan 160 mg daily in the morning and then once you have finished that bottle. Pick up your new presciption from your pharmacy and then start taking Valsartan 320 mg once daily in the morning  Continue taking 0.5 tablets of Hydrochlorothiazide 25 mg daily in the morning  Check your blood pressure at home on Monday, Wednesday and Friday and keep a log (on your phone or piece of paper) to bring with you to your next visit. Write down date, time, blood pressure and pulse. Also bring your BP monitor and medications with you to your next visit.  Your next HTN follow-up visit will be on Tuesday, November 10th, 2020 at 9:00AM. We will see you in clinic and then check labs at the end of the visit.  Please give [place] a call at [phone] with any questions or concerns.

## 2019-05-20 ENCOUNTER — Other Ambulatory Visit: Payer: Self-pay | Admitting: Internal Medicine

## 2019-05-20 DIAGNOSIS — E785 Hyperlipidemia, unspecified: Secondary | ICD-10-CM

## 2019-05-29 ENCOUNTER — Ambulatory Visit
Admission: RE | Admit: 2019-05-29 | Discharge: 2019-05-29 | Disposition: A | Discharge status: Home / Self Care | Payer: Medicaid Other | Source: Ambulatory Visit | Attending: Family Medicine | Admitting: Family Medicine

## 2019-05-29 ENCOUNTER — Other Ambulatory Visit: Payer: Self-pay

## 2019-05-29 DIAGNOSIS — Z1231 Encounter for screening mammogram for malignant neoplasm of breast: Secondary | ICD-10-CM

## 2019-06-01 ENCOUNTER — Telehealth: Payer: Self-pay | Admitting: Family Medicine

## 2019-06-01 NOTE — Telephone Encounter (Signed)
Spoke to patient and informed her of her negative mammogram results.   She is scheduled for regular annual exam on 07/05/19.   No other concerns or complaints.

## 2019-06-01 NOTE — Progress Notes (Signed)
Attempted to contact patient to discuss results without success. See telephone encounter for details of voice message.

## 2019-06-01 NOTE — Telephone Encounter (Signed)
Attempted to contact patient to inform patient of her normal mammogram results. Was unable to reach patient. LMVM. Recommended she make an appointment at earliest convenience for HTN follow up and health maintenance. She was instructed to call clinic to make appointment.   If patient calls back, please inform her that her mammogram is negative and we will continue normal annual screening. Please have her schedule appointment for blood pressure follow up and health maintenance. Thank you.

## 2019-06-01 NOTE — Telephone Encounter (Signed)
Patient would like to get results from her breast exam.  Please call her at [phone].

## 2019-06-06 ENCOUNTER — Other Ambulatory Visit: Payer: Self-pay

## 2019-06-06 ENCOUNTER — Ambulatory Visit (INDEPENDENT_AMBULATORY_CARE_PROVIDER_SITE_OTHER): Payer: Medicaid Other

## 2019-06-06 ENCOUNTER — Other Ambulatory Visit: Payer: Medicaid Other

## 2019-06-06 VITALS — BP 158/86

## 2019-06-06 DIAGNOSIS — I10 Essential (primary) hypertension: Secondary | ICD-10-CM

## 2019-06-06 LAB — BASIC METABOLIC PANEL
BUN/Creatinine Ratio: 7 — ABNORMAL LOW (ref 12–28)
BUN: 7 mg/dL — ABNORMAL LOW (ref 8–27)
CO2: 31 mmol/L — ABNORMAL HIGH (ref 20–29)
Calcium: 8.9 mg/dL (ref 8.7–10.3)
Chloride: 96 mmol/L (ref 96–106)
Creatinine, Ser: 0.97 mg/dL (ref 0.57–1.00)
GFR calc Af Amer: 71 mL/min/{1.73_m2} (ref >59)
GFR calc non Af Amer: 62 mL/min/{1.73_m2} (ref >59)
Glucose: 99 mg/dL (ref 65–99)
Potassium: 3.5 mmol/L (ref 3.5–5.2)
Sodium: 139 mmol/L (ref 134–144)

## 2019-06-06 NOTE — Progress Notes (Signed)
Patient ID: Jocelyn Shelton                 DOB: [birth year]                      MRN: [id number]     HPI: Jocelyn Shelton is a 63 y.o. female referred by Dr. Tenny Craw to HTN clinic. PMH is significant for CAD (s/p NSTEMI in 2016), PCI/DES to P RCA and LCx, HLD, HTN, SOB, hyponatremia (Na 137 - 04/14/19, 131 - 03/28/19).   Patient was last seen by Dr. Tenny Craw on 04/14/2019. Clinic BP was elevated at 176/106 mmHg and patient did not take her BP meds before the visit. No med changes were made and patient was continued on Carvedilol 25 mg BID, HCTZ 12.5 mg daily, and Valsartan 160 mg daily. Dr. Tenny Craw was concerned patient will continue to fall through the cracks with BP control and was referred to the HTN clinic for BP management.  At the last visit with PharmD on 05/18/19, her BP remained above goal at 185/106. Her valsartan was increased to 320 mg once daily. She had reported she was taking 2 tablets of carvedilol in the evening only. It was recommended that she take 1 tablet in the morning and 1 tablet in the evening. HCTZ 12.mg daily was continued. She was encouraged to continue monitoring her BP on MWF and to bring the readings to today's visit.    Patient presents for follow up visit in HTN clinic, ambulating with a cane. She reports she has not taken her BP medications yet today, as she normally takes them around noon. Her BP in clinic was elevated but improved to 156/86. She brought in her medications but did not bring in her BP log because she forgot it at home. The valsartan bottle in the bag provided was still for the 160 mg strength. She reported she was only taking 1 tablet of the valsartan per day and hadn't been taking 2 per the last HTN visit on 10/22. She states she has been taking her carvedilol as 1 tablet in the morning and 1 tablet in the evening now. Patient states her BP at home has been better but is not able to state what those readings were, but says they were lower than the 156/86 in clinic  today. She has not had any major changes in her diet and continues to smoke.   Current HTN meds: Carvedilol 25 mg BID, HCTZ 12.5 mg daily, and Valsartan 160 mg daily  Previously tried: Lisinopril 10 mg daily (cough), Losartan 50 mg daily, amlodipine 2.5 mg daily (bilateral foot swelling), Metoprolol tartrate 50 mg BID (switched to carvedilol), Maxzide 75-50 mg 0.5 mg daily  BP goal: < 130/48mmHg  Family History: Diabetes in her daughter and sister; Heart attack in her mother; Hypertension in her father; Lung cancer (age of onset: 24) in her brother.   Social History: The patient reports that she has been smoking cigarettes. She has a 23.00 pack-year smoking history. She has never used smokeless tobacco. She is not ready to quit at this time. She reports current alcohol use of about 4.0 standard drinks of alcohol per week. She reports that she does not use drugs.   Diet: vegetables, steak, soups, wheat bread, bacon, eggs, oatmeal, sodas, beers, and water.  Exercise: Patient walks with a cane and has difficulty walking for long periods of time  Wt Readings from Last 3 Encounters:  04/14/19 166 lb (75.3 kg)  12/01/18 160 lb (72.6 kg)  09/08/18 167 lb 12.8 oz (76.1 kg)   BP Readings from Last 3 Encounters:  06/06/19 (!) 158/86  05/18/19 (!) 185/106  04/14/19 (!) 176/106   Pulse Readings from Last 3 Encounters:  05/18/19 83  04/14/19 98  03/28/19 85    Renal function: CrCl cannot be calculated (Patient's most recent lab result is older than the maximum 21 days allowed.).  Past Medical History:  Diagnosis Date   Avascular necrosis of humeral head, left (HCC) 06/17/2017   Bronchiectasis (HCC) 06/17/2017   CAD in native artery 05/23/2015   High cholesterol    Hypertension    Leg pain    ABIs 1/18: R 1.2, L 1.1   Myocardial infarction (HCC) 04/2015   Reflux    Rheumatoid arthritis (HCC)    "all over" (06/16/2017)   Scleroderma (HCC)     Current Outpatient  Medications on File Prior to Visit  Medication Sig Dispense Refill   acetaminophen (TYLENOL) 500 MG tablet Take 1,000 mg by mouth daily as needed for pain.      albuterol (PROVENTIL HFA) 108 (90 Base) MCG/ACT inhaler INHALE TWO PUFFS BY MOUTH EVERY 4 HOURS AS NEEDED FOR WHEEZING FOR COUGH FOR SHORTNESS OF BREATH 7 each 1   aspirin EC 81 MG EC tablet Take 1 tablet (81 mg total) by mouth daily.     atorvastatin (LIPITOR) 40 MG tablet TAKE 1 TABLET BY MOUTH EVERY DAY AT 6PM 90 tablet 2   Black Cohosh 40 MG CAPS Take 1 capsule by mouth daily.      Calcium Carb-Cholecalciferol (CALCIUM 600+D3 PO) Take 1 tablet by mouth daily.     carvedilol (COREG) 25 MG tablet Take 1 tablet (25 mg total) by mouth 2 (two) times daily with a meal. 60 tablet 3   clopidogrel (PLAVIX) 75 MG tablet Take 1 tablet (75 mg total) by mouth daily. 90 tablet 3   dorzolamide-timolol (COSOPT) 22.3-6.8 MG/ML ophthalmic solution Place 1 drop into both eyes 2 (two) times daily.     fluticasone (FLONASE) 50 MCG/ACT nasal spray USE TWO SPRAY(S) IN EACH NOSTRIL DAILY AS NEEDED FOR  RHINITIS 16 g 2   folic acid (FOLVITE) 1 MG tablet TAKE 1 TABLET (1 MG TOTAL) BY MOUTH DAILY. (Patient taking differently: 800 mcg daily. ) 30 tablet 6   hydrochlorothiazide (HYDRODIURIL) 25 MG tablet TAKE 1/2 TABLET BY MOUTH EVERY DAY 15 tablet 5   LUMIGAN 0.01 % SOLN INSTILL 1 DROP INTO BOTH EYES EVERY EVENING  6   methotrexate (RHEUMATREX) 2.5 MG tablet TAKE 4 TABLETS (10MG ) BY MOUTH 2 TIMES A WEEK EVERY MONDAY AND TUESDAY (Patient taking differently: TAKE 4 TABLETS (10MG ) BY MOUTH 1 TIMES A WEEK EVERY MONDAY) 32 tablet 0   Multiple Vitamin (MULTIVITAMIN WITH MINERALS) TABS tablet Take 1 tablet by mouth daily.     nitroGLYCERIN (NITROSTAT) 0.4 MG SL tablet PLACE 1 TABLET UNDER THE TONGUE EVERY 5 MINUTES AS NEEDED FOR CHEST PAIN 25 tablet 5   OLANZapine (ZYPREXA) 5 MG tablet TAKE 1 TABLET BY MOUTH EVERYDAY AT BEDTIME 90 tablet 3   predniSONE  (DELTASONE) 1 MG tablet TAKE 3 TABLETS BY MOUTH EVERY DAY     sulfaSALAzine (AZULFIDINE) 500 MG tablet Take 2 tablets (1,000 mg total) by mouth 2 (two) times daily. 120 tablet 0   Tiotropium Bromide-Olodaterol (STIOLTO RESPIMAT) 2.5-2.5 MCG/ACT AERS Inhale into the lungs.     valsartan (DIOVAN) 320 MG tablet Take 1 tablet (320 mg total) by  mouth daily. 90 tablet 3   vitamin B-12 (CYANOCOBALAMIN) 500 MCG tablet Take 500 mcg by mouth daily.     No current facility-administered medications on file prior to visit.     Allergies  Allergen Reactions   Ticagrelor Shortness Of Breath   Cymbalta [Duloxetine Hcl] Diarrhea and Nausea Only    No appetite, stomach pain   Lisinopril Cough   Tomato Rash     Assessment/Plan:  1. Hypertension - BP is 158/86, above goal of < 130/80. Patient did not take her medications this morning and reports improved BP at home. BMET today. Will call patient tomorrow to discuss lab work and gain more information regarding her home BP log. Defer medication changes until more information with labs and home BP readings. Continue taking valsartan 160 mg daily, carvedilol 25 mg BID and HCTZ 12.5 mg daily. Counseled patient in regard to smoking cessation but pt not ready to quit at this time. Follow up in HTN clinic in 2 weeks. Advised patient to take medications 1 hour prior to coming to this visit and to bring her BP log with her. Next visit with Family Medicine is on 07/04/19.   Vertis Kelch, Sherian Rein PGY2 Cardiology Pharmacy Resident [facility] [address] [place], [place] [address] Phone: [phone]; Fax: [phone]

## 2019-06-06 NOTE — Patient Instructions (Addendum)
Thank you for seeing [place] today!  Your blood pressure today was 158/86 without taking your medicines this morning. This looks better than what it had been before.   Continue taking your medicines as prescribed.   Labs today  I will call you tomorrow to let you know what your lab results are and to get more information from you about your blood pressure at home.   Please take your medicines 1 hour prior to your next visit on 11/24 @ 9AM. Please bring your blood pressure log to this visit.   Please call me at [phone] if you have any other questions or concerns.

## 2019-06-07 ENCOUNTER — Telehealth: Payer: Self-pay

## 2019-06-07 NOTE — Telephone Encounter (Signed)
Attempted to speak with pt regarding lab work and home BP readings as noted in clinic visit yesterday. Left message for pt to return call.

## 2019-06-08 ENCOUNTER — Telehealth: Payer: Self-pay | Admitting: Internal Medicine

## 2019-06-08 NOTE — Telephone Encounter (Signed)
Pt and her daughter have been made aware of pt's lab results by phone with verbal understanding. Both aware to continue on current Tx plan. Pt's daughter thanked me for the call. The patient has been notified of the result and verbalized understanding.  All questions (if any) were answered. Julaine Hua, [facility] 06/08/2019 3:29 PM

## 2019-06-08 NOTE — Telephone Encounter (Signed)
New Message ° ° °Pt is returning call for lab results  ° ° °Please call back  °

## 2019-06-09 NOTE — Telephone Encounter (Signed)
Called pt to discuss home BP readings. Spoke with daughter who takes her BP each day. She said she thinks her mother threw away the old BP sheet but she did have a reading from the night prior - 139/84, HR 93.   She confirmed she had started taking valsartan 320 mg daily a week or so ago and that she has removed the valsartan 160 mg bottle from her mother's medication bag.  Continue with current therapy and will follow up again with pt in clinic on 11/24.

## 2019-06-20 ENCOUNTER — Ambulatory Visit: Payer: Medicaid Other

## 2019-06-26 ENCOUNTER — Ambulatory Visit: Payer: Medicaid Other

## 2019-07-03 ENCOUNTER — Ambulatory Visit (INDEPENDENT_AMBULATORY_CARE_PROVIDER_SITE_OTHER): Payer: Medicaid Other | Admitting: Pharmacist

## 2019-07-03 ENCOUNTER — Other Ambulatory Visit: Payer: Self-pay

## 2019-07-03 VITALS — BP 155/98 | HR 90

## 2019-07-03 DIAGNOSIS — I10 Essential (primary) hypertension: Secondary | ICD-10-CM

## 2019-07-03 MED ORDER — DILTIAZEM HCL ER 120 MG PO CP24: 120.0000 mg | ORAL_CAPSULE | Freq: Every day | ORAL | 11 refills | Status: DC

## 2019-07-03 NOTE — Progress Notes (Signed)
Patient ID: Jocelyn Shelton                 DOB: 1955/10/16                      MRN: [id number]     HPI: Jocelyn Shelton is a 63 y.o. female referred by Dr. Harrington Challenger to HTN clinic. PMH is significant for CAD (s/p NSTEMI in 2016), PCI/DES to P RCA and LCx, HLD, HTN, SOB, hyponatremia (Na 137 - 04/14/19, 131 - 03/28/19).   Patient was last seen by Dr. Harrington Challenger on 04/14/2019. Clinic BP was elevated at 176/106 mmHg and patient did not take her BP meds before the visit. No med changes were made and patient was continued on Carvedilol 25 mg BID, HCTZ 12.5 mg daily, and Valsartan 160 mg daily. Dr. Harrington Challenger was concerned patient will continue to fall through the cracks with BP control and was referred to the HTN clinic for BP management.  At the last visit with PharmD on 06/06/2019 her BP remained above goal at 158/86, however she had not taken her medications prior to visit. Her daughter was called who confirmed that patient was taking the higher dose of valsartan, 320mg , for about a week at that point. Her only home BP reading she could find was 139/84. No changes were made. Her BMP was stable and she was supposed to follow up in 2 weeks, however appointment was rescheduled for today. Smoking cessation counseling was offered, but patient stated she was not ready to quite.  Patient presents today for follow up. She denies dizziness, lightheadedness, headache, blurred vision, SOB or swelling. She states that she doesn't miss any of her medications. Her daughter takes her blood pressure at home, but she did not have any recorded readings. Called the daughter during the visit, but she did not have a log and stated the meter stopped saving the readings. States her blood pressure has been in the 140's at home.  Current HTN meds: Carvedilol 25 mg BID, HCTZ 12.5 mg daily, and Valsartan 320 mg daily  Previously tried: Lisinopril 10 mg daily (cough), Losartan 50 mg daily, amlodipine 2.5 mg daily (bilateral foot swelling), Metoprolol  tartrate 50 mg BID (switched to carvedilol), Maxzide 75-50 mg 0.5 mg daily, HCTZ 25mg  daily (known, but thought maybe decrease due to hyponatremia)  BP goal: < 130/60mmHg  Family History: Diabetes in her daughter and sister; Heart attack in her mother; Hypertension in her father; Lung cancer (age of onset: 12) in her brother.   Social History: The patient reports that she has been smoking cigarettes. She has a 23.00 pack-year smoking history. She has never used smokeless tobacco. She is not ready to quit at this time. She reports current alcohol use of about 4.0 standard drinks of alcohol per week. She reports that she does not use drugs.   Diet: vegetables, steak, soups, wheat bread, bacon, eggs, oatmeal, sodas, beers, and water. Puts salt on her food, but doesn't think her daughter cooks with salt.  Exercise: Patient walks with a cane and has difficulty walking for long periods of time  Wt Readings from Last 3 Encounters:  04/14/19 166 lb (75.3 kg)  12/01/18 160 lb (72.6 kg)  09/08/18 167 lb 12.8 oz (76.1 kg)   BP Readings from Last 3 Encounters:  06/06/19 (!) 158/86  05/18/19 (!) 185/106  04/14/19 (!) 176/106   Pulse Readings from Last 3 Encounters:  05/18/19 83  04/14/19 98  03/28/19 85  Renal function: CrCl cannot be calculated (Patient's most recent lab result is older than the maximum 21 days allowed.).  Past Medical History:  Diagnosis Date  . Avascular necrosis of humeral head, left (HCC) 06/17/2017  . Bronchiectasis (HCC) 06/17/2017  . CAD in native artery 05/23/2015  . High cholesterol   . Hypertension   . Leg pain    ABIs 1/18: R 1.2, L 1.1  . Myocardial infarction (HCC) 04/2015  . Reflux   . Rheumatoid arthritis (HCC)    "all over" (06/16/2017)  . Scleroderma (HCC)     Current Outpatient Medications on File Prior to Visit  Medication Sig Dispense Refill  . acetaminophen (TYLENOL) 500 MG tablet Take 1,000 mg by mouth daily as needed for pain.     Marland Kitchen  albuterol (PROVENTIL HFA) 108 (90 Base) MCG/ACT inhaler INHALE TWO PUFFS BY MOUTH EVERY 4 HOURS AS NEEDED FOR WHEEZING FOR COUGH FOR SHORTNESS OF BREATH 7 each 1  . aspirin EC 81 MG EC tablet Take 1 tablet (81 mg total) by mouth daily.    Marland Kitchen atorvastatin (LIPITOR) 40 MG tablet TAKE 1 TABLET BY MOUTH EVERY DAY AT 6PM 90 tablet 2  . Black Cohosh 40 MG CAPS Take 1 capsule by mouth daily.     . Calcium Carb-Cholecalciferol (CALCIUM 600+D3 PO) Take 1 tablet by mouth daily.    . carvedilol (COREG) 25 MG tablet Take 1 tablet (25 mg total) by mouth 2 (two) times daily with a meal. 60 tablet 3  . clopidogrel (PLAVIX) 75 MG tablet Take 1 tablet (75 mg total) by mouth daily. 90 tablet 3  . dorzolamide-timolol (COSOPT) 22.3-6.8 MG/ML ophthalmic solution Place 1 drop into both eyes 2 (two) times daily.    . fluticasone (FLONASE) 50 MCG/ACT nasal spray USE TWO SPRAY(S) IN EACH NOSTRIL DAILY AS NEEDED FOR  RHINITIS 16 g 2  . folic acid (FOLVITE) 1 MG tablet TAKE 1 TABLET (1 MG TOTAL) BY MOUTH DAILY. (Patient taking differently: 800 mcg daily. ) 30 tablet 6  . hydrochlorothiazide (HYDRODIURIL) 25 MG tablet TAKE 1/2 TABLET BY MOUTH EVERY DAY 15 tablet 5  . LUMIGAN 0.01 % SOLN INSTILL 1 DROP INTO BOTH EYES EVERY EVENING  6  . methotrexate (RHEUMATREX) 2.5 MG tablet TAKE 4 TABLETS (10MG ) BY MOUTH 2 TIMES A WEEK EVERY MONDAY AND TUESDAY (Patient taking differently: TAKE 4 TABLETS (10MG ) BY MOUTH 1 TIMES A WEEK EVERY MONDAY) 32 tablet 0  . Multiple Vitamin (MULTIVITAMIN WITH MINERALS) TABS tablet Take 1 tablet by mouth daily.    . nitroGLYCERIN (NITROSTAT) 0.4 MG SL tablet PLACE 1 TABLET UNDER THE TONGUE EVERY 5 MINUTES AS NEEDED FOR CHEST PAIN 25 tablet 5  . OLANZapine (ZYPREXA) 5 MG tablet TAKE 1 TABLET BY MOUTH EVERYDAY AT BEDTIME 90 tablet 3  . predniSONE (DELTASONE) 1 MG tablet TAKE 3 TABLETS BY MOUTH EVERY DAY    . sulfaSALAzine (AZULFIDINE) 500 MG tablet Take 2 tablets (1,000 mg total) by mouth 2 (two) times  daily. 120 tablet 0  . Tiotropium Bromide-Olodaterol (STIOLTO RESPIMAT) 2.5-2.5 MCG/ACT AERS Inhale into the lungs.    . valsartan (DIOVAN) 320 MG tablet Take 1 tablet (320 mg total) by mouth daily. 90 tablet 3  . vitamin B-12 (CYANOCOBALAMIN) 500 MCG tablet Take 500 mcg by mouth daily.     No current facility-administered medications on file prior to visit.     Allergies  Allergen Reactions  . Ticagrelor Shortness Of Breath  . Cymbalta [Duloxetine Hcl] Diarrhea and  Nausea Only    No appetite, stomach pain  . Lisinopril Cough  . Tomato Rash     Assessment/Plan:  1. Hypertension - Blood pressure is above goal of <130/80 in clinic today and does not sound like it is at goal at home either. I am hesitant to increase HCTZ to 25mg  due to history of hyponatremia. Patient HR is adequate at 90 BPM today and has been in the 80's and 90's at most visits. She was on verapamil in the past and tolerated well. Diltiazem is more favorable than hydralazine since it is only once daily dosing vs three times a day. Will start diltiazem ER 120mg  daily. Follow up in clinic in 4 weeks (patient requested appointment after the new year). Patient advised to avoid salting her food. She was counseled on the negative affects of smoking. She is not ready to quite at this time. Informed that we are here to help when she decides she is ready to quite.  Thank you,  Olene Floss, Pharm.D, BCPS, CPP [facility]  [address], [place], [place] [address]  Phone: [phone]; Fax: [phone]

## 2019-07-03 NOTE — Patient Instructions (Addendum)
It was a pleasure to meet you today.  Please start taking diltiazem 120mg  daily. Continue carvedilol 25 mg BID, HCTZ 12.5 mg daily and Valsartan 320 mg daily.  Try not to add salt to your food as this can increase your blood pressure  If you experience mild constipation you can try docusate or senna/docusate to help with this  Continue to check your blood pressure and heart rate at home. Please write these numbers down and bring them with you to your next appointment. Please call us if you heart rate is consisently less than 55.  Call us at [phone] with any questions or concerns

## 2019-07-04 ENCOUNTER — Ambulatory Visit: Payer: Medicaid Other

## 2019-07-05 ENCOUNTER — Encounter: Payer: Self-pay | Admitting: Family Medicine

## 2019-07-05 ENCOUNTER — Other Ambulatory Visit: Payer: Self-pay

## 2019-07-05 ENCOUNTER — Ambulatory Visit (INDEPENDENT_AMBULATORY_CARE_PROVIDER_SITE_OTHER): Payer: Medicaid Other | Admitting: Family Medicine

## 2019-07-05 ENCOUNTER — Ambulatory Visit (HOSPITAL_COMMUNITY)
Admission: RE | Admit: 2019-07-05 | Discharge: 2019-07-05 | Disposition: A | Discharge status: Home / Self Care | Payer: Medicaid Other | Source: Ambulatory Visit | Attending: Family Medicine | Admitting: Family Medicine

## 2019-07-05 VITALS — BP 140/82 | HR 66 | Wt 175.4 lb

## 2019-07-05 DIAGNOSIS — R009 Unspecified abnormalities of heart beat: Secondary | ICD-10-CM | POA: Diagnosis not present

## 2019-07-05 DIAGNOSIS — Z23 Encounter for immunization: Secondary | ICD-10-CM

## 2019-07-05 DIAGNOSIS — J449 Chronic obstructive pulmonary disease, unspecified: Secondary | ICD-10-CM | POA: Diagnosis not present

## 2019-07-05 DIAGNOSIS — I2510 Atherosclerotic heart disease of native coronary artery without angina pectoris: Secondary | ICD-10-CM

## 2019-07-05 DIAGNOSIS — Z0000 Encounter for general adult medical examination without abnormal findings: Secondary | ICD-10-CM | POA: Diagnosis not present

## 2019-07-05 DIAGNOSIS — I10 Essential (primary) hypertension: Secondary | ICD-10-CM

## 2019-07-05 DIAGNOSIS — F17200 Nicotine dependence, unspecified, uncomplicated: Secondary | ICD-10-CM

## 2019-07-05 NOTE — Progress Notes (Signed)
Subjective:   Patient ID: Jocelyn Shelton    DOB: 04-Apr-1956, 63 y.o. female   MRN: [id number]  Jocelyn Shelton is a 63 y.o. female with a history of CAD, HTN, h/o MI, ILD with pulmonary HTN, centrilobular emphysema/COPD, raynauds syndrome, GERD, RA on immunosuppression, avascular necrosis of humeral head, scleroderma, MDD, tobacco use disorder here for annual visit.  HTN: BP 138/82. Repeat 140/82. Currenlty on HCTZ 12.5mg  QD, Coreg 25mg  BID, Valsartan 320mg  QD, and recently started on Diltiazem 120mg  QD. Endorses compliance. Denies any chest pain, SOB, headaches, vision changes. Denies any hypotensive symptoms such as dizziness/lightheadedness. Followed by cardiologist at [place]. Last appointment 07/03/2019. Kidney function WNL on last BMP. BP Goal <130/80. Pt to follow up with cardiology in 4 weeks.   H/o CAD s/p stent in 2016: Denies any chest pain, SOB at rest or exertion. Currently on Lipitor 40mg  QD, ASA 81mg  QD, Plavix 75mg  QD. Followed closely by Dr. Harrington Challenger at [facility]   Tobacco Abuse: Currently smoke 1/2 pack/day x 46 years (started when she was 63 years old). 23 pack years. Patient is not interested in cmoking cessation.   COPD  Centrilobar Emphysema  Restrictive Lung Disease  ILD with pulmonary HTN: PFT 06/15/2019 shows moderate restriction ratio 91% FEV1 59% FVC 52%. Followed closely by pulmonologist Worthy Keeler, PA at [facility]. Denies any current symptoms such as SOB, wheezing, dyspnea on exertion. Compliant with medications which include Stiolto inhaler QD and Albuterol PRN  Health Maintenance: Due for Flu vaccine, TDAP, and Colonoscopy. Patient notes she had colonoscopy this year at the hospital in [facility].    ROS: Patient reports no  vision/ hearing changes,anorexia, weight change, fever, adenopathy, persistant / recurrent hoarseness, swallowing issues, chest pain, edema,persistant / recurrent cough, hemoptysis, dyspnea(rest, exertional, paroxysmal nocturnal),  gastrointestinal  bleeding (melena, rectal bleeding), abdominal pain, excessive heart burn, GU symptoms(dysuria, hematuria, pyuria, voiding/incontinence  Issues) syncope, focal weakness, severe memory loss, concerning skin lesions, depression, anxiety, abnormal bruising/bleeding, major joint swelling, breast masses or abnormal vaginal bleeding.    [place], medications and smoking status reviewed.  Objective:   BP 140/82   Pulse 66   Wt 175 lb 6.4 oz (79.6 kg)   SpO2 98%   BMI 28.31 kg/m  Vitals and nursing note reviewed.  General: very pleasant well appearing older woman, sitting comfortably in exam chair, well nourished, well developed, in no acute distress with non-toxic appearance CV: regular rate, irregular rhythm without murmurs, rubs, or gallops, no lower extremity edema Lungs: some rhonchi in lower lobes, otherwise clear, long expiratory phase Abdomen: soft, non-tender, non-distended, normoactive bowel sounds Skin: warm, dry Extremities: warm and well perfused MSK:  gait normal Neuro: Alert and oriented, speech normal  Assessment & Plan:   Primary hypertension Blood pressure mildly above goal but much improved from prior BP readings per chart review. Currently on 4 blood pressure lowering agents. Tolerating well. Plan to follow up with cardiologist on 12/2. Will defer changes to HTN meds to cardiologist. At this time continue current meds.   Coronary artery disease involving native coronary artery of native heart without angina pectoris Chronic. No current anginal symptoms. Unclear if patient still needs to be on DAPT. Will defer to cardiologist at this time. - Patient to follow up with cardiologist on 12/2 - Continue ASA 81mg  QD, Lipitor 40mg  QD, and Plavix 75mg  QD  COPD (chronic obstructive pulmonary disease) ([place]) Followed closely by pulmonologist at [place] compliance to [employer] inhaler. Denies any dyspnea. Discussed  smoking cessation have patient continued to  decline.  - Continue Stiolto daily and albuterol PRN - Follow up with pulmonologist as scheduled - smoking cessation counseling provided  Tobacco use disorder Smoked 1/2 pack/day x 46 years (23 pack year history). Patient not ready to quit. Patient was counseled on the risks of tobacco use and cessation strongly encouraged.  Abnormality of heart beat Irregular rhythm appreciated on auscultation. No PMH of arrhythmia, however given comorbidities she is at higher risk of developing. EKG obtained and notable for atrial enlargement and PACs. Regular rate. Patient is asymptomatic. No further evaluation indicated at this time. Will continue to monitor.    Health Maintenance: TDAP and Flu given today Patient notes colonoscopy performed at [facility]. Will send for records request. Will hold off on referral to GI at this time.  Orders Placed This Encounter  Procedures  . Flu Vaccine QUAD 36+ mos IM  . Tdap vaccine greater than or equal to 7yo IM  . EKG 12-Lead    Orpah Cobb, DO PGY-2, [facility] 07/07/2019 10:07 AM

## 2019-07-07 ENCOUNTER — Encounter: Payer: Self-pay | Admitting: Family Medicine

## 2019-07-07 DIAGNOSIS — R009 Unspecified abnormalities of heart beat: Secondary | ICD-10-CM | POA: Insufficient documentation

## 2019-07-07 NOTE — Assessment & Plan Note (Signed)
Irregular rhythm appreciated on auscultation. No PMH of arrhythmia, however given comorbidities she is at higher risk of developing. EKG obtained and notable for atrial enlargement and PACs. Regular rate. Patient is asymptomatic. No further evaluation indicated at this time. Will continue to monitor.

## 2019-07-07 NOTE — Assessment & Plan Note (Signed)
Blood pressure mildly above goal but much improved from prior BP readings per chart review. Currently on 4 blood pressure lowering agents. Tolerating well. Plan to follow up with cardiologist on 12/2. Will defer changes to HTN meds to cardiologist. At this time continue current meds.

## 2019-07-07 NOTE — Assessment & Plan Note (Signed)
Followed closely by pulmonologist at [place] compliance to [employer] inhaler. Denies any dyspnea. Discussed smoking cessation have patient continued to decline.  - Continue Stiolto daily and albuterol PRN - Follow up with pulmonologist as scheduled - smoking cessation counseling provided

## 2019-07-07 NOTE — Assessment & Plan Note (Signed)
Smoked 1/2 pack/day x 46 years (23 pack year history). Patient not ready to quit. Patient was counseled on the risks of tobacco use and cessation strongly encouraged.

## 2019-07-07 NOTE — Assessment & Plan Note (Signed)
Chronic. No current anginal symptoms. Unclear if patient still needs to be on DAPT. Will defer to cardiologist at this time. - Patient to follow up with cardiologist on 12/2 - Continue ASA 81mg  QD, Lipitor 40mg  QD, and Plavix 75mg  QD

## 2019-07-11 ENCOUNTER — Other Ambulatory Visit: Payer: Self-pay | Admitting: *Deleted

## 2019-07-11 DIAGNOSIS — I10 Essential (primary) hypertension: Secondary | ICD-10-CM

## 2019-07-11 MED ORDER — HYDROCHLOROTHIAZIDE 25 MG PO TABS: 12.5000 mg | ORAL_TABLET | Freq: Every day | ORAL | 5 refills | Status: DC

## 2019-08-01 ENCOUNTER — Ambulatory Visit: Payer: Medicaid Other

## 2019-12-04 ENCOUNTER — Other Ambulatory Visit: Payer: Self-pay | Admitting: Family Medicine

## 2019-12-04 DIAGNOSIS — I10 Essential (primary) hypertension: Secondary | ICD-10-CM

## 2019-12-16 ENCOUNTER — Other Ambulatory Visit: Payer: Self-pay

## 2019-12-16 ENCOUNTER — Emergency Department (HOSPITAL_COMMUNITY): Payer: Medicaid Other

## 2019-12-16 ENCOUNTER — Encounter (HOSPITAL_COMMUNITY): Payer: Self-pay

## 2019-12-16 ENCOUNTER — Emergency Department (HOSPITAL_COMMUNITY)
Admission: EM | Admit: 2019-12-16 | Discharge: 2019-12-16 | Disposition: A | Discharge status: Home / Self Care | Payer: Medicaid Other | Attending: Emergency Medicine | Admitting: Emergency Medicine

## 2019-12-16 DIAGNOSIS — J449 Chronic obstructive pulmonary disease, unspecified: Secondary | ICD-10-CM | POA: Diagnosis not present

## 2019-12-16 DIAGNOSIS — I10 Essential (primary) hypertension: Secondary | ICD-10-CM | POA: Insufficient documentation

## 2019-12-16 DIAGNOSIS — F17210 Nicotine dependence, cigarettes, uncomplicated: Secondary | ICD-10-CM | POA: Diagnosis not present

## 2019-12-16 DIAGNOSIS — I2510 Atherosclerotic heart disease of native coronary artery without angina pectoris: Secondary | ICD-10-CM | POA: Insufficient documentation

## 2019-12-16 DIAGNOSIS — R040 Epistaxis: Secondary | ICD-10-CM | POA: Insufficient documentation

## 2019-12-16 DIAGNOSIS — Z79899 Other long term (current) drug therapy: Secondary | ICD-10-CM | POA: Diagnosis not present

## 2019-12-16 DIAGNOSIS — Z20822 Contact with and (suspected) exposure to covid-19: Secondary | ICD-10-CM | POA: Diagnosis not present

## 2019-12-16 LAB — SARS CORONAVIRUS 2 BY RT PCR (HOSPITAL ORDER, PERFORMED IN ~~LOC~~ HOSPITAL LAB): SARS Coronavirus 2: NEGATIVE

## 2019-12-16 LAB — POC SARS CORONAVIRUS 2 AG -  ED: SARS Coronavirus 2 Ag: NEGATIVE

## 2019-12-16 NOTE — ED Triage Notes (Signed)
To room via EMS from home.  EMS got called for nosebleed.  EMS arrived and educated pt on holding pressure on nose.  After bleeding stopped EMS used Afrin.  No bleeding since.  Daughter wanted pt transported to ED for onset 1 week productive cough, clear thick phlegm and decrease in appetite.  Pt has been using Albuterol inhaler more frequently than usual.    EMS BP 140/80 HR 96 RR 22 SpO2 97% room air temp 97G  CBG 134

## 2019-12-16 NOTE — ED Provider Notes (Signed)
[facility] EMERGENCY DEPARTMENT Provider Note   CSN: [id number] Arrival date & time:        History Chief Complaint  Patient presents with  . Cough    Jocelyn Shelton is a 64 y.o. female.  HPI  64 year old female history of COPD, not on anticoagulation, presents today complaining of left-sided nosebleed.  She states that the nosebleed began approximately 30 minutes prior to evaluation.  EMS brought her into the ED.  She states that they held pressure on her nose until did not bleed and then she was given Afrin.  She states she has COPD and has had some ongoing coughing.  She has had to use her albuterol and increased frequency of the past several days.  She has not had fever or chills.  There is some discolored sputum.  She denies having had Covid, Covid exposure, and has not had a Covid vaccine.  She denies chest pain, abdominal pain, nausea, vomiting, or diarrhea.     Past Medical History:  Diagnosis Date  . Avascular necrosis of humeral head, left (HCC) 06/17/2017  . Avascular necrosis of humeral head, right (HCC) 06/17/2017  . Bronchiectasis (HCC) 06/17/2017  . CAD in native artery 05/23/2015  . High cholesterol   . Hypertension   . Leg pain    ABIs 1/18: R 1.2, L 1.1  . Myocardial infarction (HCC) 04/2015  . Raynauds syndrome 09/23/2006   Qualifier: Diagnosis of  By: Tressia Danas  MD, Adlih    . Reflux   . Rheumatoid arthritis (HCC)    "all over" (06/16/2017)  . Scleroderma [facility])     Patient Active Problem List   Diagnosis Date Noted  . Abnormality of heart beat 07/07/2019  . Centrilobular emphysema (HCC) 07/29/2017  . ILD (interstitial lung disease) (HCC) 07/29/2017  . Pulmonary hypertension (HCC), Possible 06/17/2017  . Coronary artery disease involving native coronary artery of native heart without angina pectoris   . Scleroderma (HCC) 03/08/2016  . Immunosuppressed status (HCC)   . COPD (chronic obstructive pulmonary disease) (HCC)  08/30/2015  . Rheumatoid arthritis (HCC) 03/14/2013  . GERD (gastroesophageal reflux disease) 08/02/2008  . Major depressive disorder, recurrent episode (HCC) 09/23/2006  . Tobacco use disorder 09/23/2006  . Primary hypertension 09/23/2006    Past Surgical History:  Procedure Laterality Date  . APPENDECTOMY  90's  . CARDIAC CATHETERIZATION N/A 05/22/2015   Procedure: Left Heart Cath and Coronary Angiography;  Surgeon: Lennette Bihari, MD;  Location: [facility] INVAS[facility] CV LAB;  Service: Cardiovascular;  Laterality: N/A;  . CARDIAC CATHETERIZATION N/A 05/22/2015   Procedure: Coronary Stent Intervention;  Surgeon: Lennette Bihari, MD;  Location: [facility] INVASIVE CV LAB;  Service: Cardiovascular;  Laterality: N/A;     OB History   No obstetric history on file.     Family History  Problem Relation Age of Onset  . Hypertension Father   . Heart attack Mother   . Diabetes Daughter   . Diabetes Sister   . Lung cancer Brother 34    Social History   Tobacco Use  . Smoking status: Current Every Day Smoker    Packs/day: 0.50    Years: 46.00    Pack years: 23.00    Types: Cigarettes  . Smokeless tobacco: Never Used  Substance Use Topics  . Alcohol use: Yes    Alcohol/week: 4.0 standard drinks    Types: 4 Standard drinks or equivalent per week  . Drug use: No    Home Medications Prior to  Admission medications   Medication Sig Start Date End Date Taking? Authorizing Provider  acetaminophen (TYLENOL) 500 MG tablet Take 1,000 mg by mouth daily as needed for pain.     [provider]  albuterol (PROVENTIL HFA) 108 (90 Base) MCG/ACT inhaler INHALE TWO PUFFS BY MOUTH EVERY 4 HOURS AS NEEDED FOR WHEEZING FOR COUGH FOR SHORTNESS OF BREATH 05/26/17   [place] Bing, DO  aspirin EC 81 MG EC tablet Take 1 tablet (81 mg total) by mouth daily. 05/24/15   Barrett, Evelene Croon, PA-C  atorvastatin (LIPITOR) 40 MG tablet TAKE 1 TABLET BY MOUTH EVERY DAY AT [facility] 05/24/19   Fay Records, MD  Black  Cohosh 40 MG CAPS Take 1 capsule by mouth daily.     [provider]  Calcium Carb-Cholecalciferol (CALCIUM 600+D3 PO) Take 1 tablet by mouth daily.    [provider]  carvedilol (COREG) 25 MG tablet TAKE 1 TABLET (25 MG TOTAL) BY MOUTH 2 (TWO) TIMES DAILY WITH A MEAL. 12/05/19   Mullis, Kiersten P, DO  clopidogrel (PLAVIX) 75 MG tablet Take 1 tablet (75 mg total) by mouth daily. 01/18/19   [place] Bing, DO  diltiazem (DILACOR XR) 120 MG 24 hr capsule Take 1 capsule (120 mg total) by mouth daily. 07/03/19   Fay Records, MD  dorzolamide-timolol (COSOPT) 22.3-6.8 MG/ML ophthalmic solution Place 1 drop into both eyes 2 (two) times daily.    [provider]  fluticasone (FLONASE) 50 MCG/ACT nasal spray USE TWO SPRAY(S) IN EACH NOSTRIL DAILY AS NEEDED FOR  RHINITIS 07/25/15   Par[place] Ranger, Devonne Doughty, DO  folic acid (FOLVITE) 1 MG tablet TAKE 1 TABLET (1 MG TOTAL) BY MOUTH DAILY. Patient taking differently: 800 mcg daily.  01/22/15   Karamalegos, Devonne Doughty, DO  hydrochlorothiazide (HYDRODIURIL) 25 MG tablet Take 0.5 tablets (12.5 mg total) by mouth daily. 07/11/19   Mullis, Kiersten P, DO  LUMIGAN 0.01 % SOLN INSTILL 1 DROP INTO BOTH EYES EVERY EVENING 06/14/18   [provider]  methotrexate (RHEUMATREX) 2.5 MG tablet TAKE 4 TABLETS (10MG ) BY MOUTH 2 TIMES A WEEK EVERY MONDAY AND TUESDAY Patient taking differently: TAKE 4 TABLETS (10MG ) BY MOUTH 1 TIMES A WEEK EVERY MONDAY 07/23/17   [place] Bing, DO  Multiple Vitamin (MULTIVITAMIN WITH MINERALS) TABS tablet Take 1 tablet by mouth daily.    [provider]  nitroGLYCERIN (NITROSTAT) 0.4 MG SL tablet PLACE 1 TABLET UNDER THE TONGUE EVERY 5 MINUTES AS NEEDED FOR CHEST PAIN 04/14/19   Fay Records, MD  OLANZapine (ZYPREXA) 5 MG tablet TAKE 1 TABLET BY MOUTH EVERYDAY AT BEDTIME 03/14/19   Mullis, Kiersten P, DO  predniSONE (DELTASONE) 1 MG tablet TAKE 3 TABLETS BY MOUTH EVERY DAY 06/27/18   [provider]  sulfaSALAzine (AZULFIDINE) 500 MG tablet Take 2 tablets (1,000 mg total) by mouth 2 (two) times daily. 05/26/17   [place] Bing, DO  Tiotropium Bromide-Olodaterol (STIOLTO RESPIMAT) 2.5-2.5 MCG/ACT AERS Inhale into the lungs. 06/27/18   [provider]  valsartan (DIOVAN) 320 MG tablet Take 1 tablet (320 mg total) by mouth daily. 05/18/19   Fay Records, MD  vitamin B-12 (CYANOCOBALAMIN) 500 MCG tablet Take 500 mcg by mouth daily.    [provider]    Allergies    Ticagrelor, Cymbalta [duloxetine hcl], Lisinopril, and Tomato  Review of Systems   Review of Systems  All other systems reviewed and are negative.   Physical Exam Updated Vital  Signs BP (!) 157/92 (BP Location: Right Arm)   Pulse 80   Temp 99.2 F (37.3 C) (Oral)   Resp 18   SpO2 99%   Physical Exam Vitals and nursing note reviewed.  Constitutional:      General: She is not in acute distress.    Appearance: Normal appearance. She is not ill-appearing.  HENT:     Head: Normocephalic.     Right Ear: External ear normal.     Left Ear: External ear normal.     Nose: Nose normal.     Comments: No bleeding noted    Mouth/Throat:     Mouth: Mucous membranes are moist.  Eyes:     Extraocular Movements: Extraocular movements intact.     Pupils: Pupils are equal, round, and reactive to light.  Neurological:     Mental Status: She is alert.     ED Results / Procedures / Treatments   Labs (all labs ordered are listed, but only abnormal results are displayed) Labs Reviewed - No data to display  EKG None  Radiology DG Chest [facility] 1 View  Result Date: 12/16/2019 CLINICAL DATA:  Cough EXAM: PORTABLE CHEST 1 VIEW COMPARISON:  03/28/2019 FINDINGS: Cardiac shadow is prominent but stable. The lungs are well aerated bilaterally. Mild scarring is noted in the left base. No focal infiltrate or sizable effusion is seen. No bony abnormality is noted. IMPRESSION: Scarring in the left lung  base stable from the prior exam. No acute abnormality is seen. Electronically Signed   By: Alcide Clever M.D.   On: 12/16/2019 17:01    Procedures Procedures (including critical care time)  Medications Ordered in ED Medications - No data to display  ED Course  I have reviewed the triage vital signs and the nursing notes.  Pertinent labs & imaging results that were available during my care of the patient were reviewed by me and considered in my medical decision making (see chart for details).    MDM Rules/Calculators/A&P                     1 epistaxis- resolved prior to arrival with pressure and Afrin.  Continues to not be bleeding here Discussed treatment if it should return patient voices understanding 2 cough/COPD patient has had some coughing over the past several days.  She has not had fever and denies dyspnea.  Point-of-care Covid test is negative.  Chest x-[place] is stable from before.  Will send PCR, and patient will be placed on precautions with this, she is given return precautions and voiced understanding. Final Clinical Impression(s) / ED Diagnoses Final diagnoses:  None    Rx / DC Orders ED Discharge Orders    None       Margarita Grizzle, MD 12/16/19 [id number]

## 2019-12-16 NOTE — Discharge Instructions (Addendum)
Nosebleed was gone prior to my evaluation.  If this returns, please place pressure as I showed you and hold for 20 minutes without releasing. You received a test for Covid that was negative.  Another test is being sent and should be back within 2 hours.  Your chest x-[place]iver is clear and are otherwise appears stable for discharge. Please return the emergency department if you have new or worsening symptoms

## 2020-02-23 ENCOUNTER — Other Ambulatory Visit: Payer: Self-pay | Admitting: Internal Medicine

## 2020-02-23 DIAGNOSIS — E785 Hyperlipidemia, unspecified: Secondary | ICD-10-CM

## 2020-02-27 ENCOUNTER — Other Ambulatory Visit: Payer: Self-pay | Admitting: Internal Medicine

## 2020-02-27 DIAGNOSIS — I2510 Atherosclerotic heart disease of native coronary artery without angina pectoris: Secondary | ICD-10-CM

## 2020-03-23 ENCOUNTER — Other Ambulatory Visit: Payer: Self-pay | Admitting: Internal Medicine

## 2020-03-23 DIAGNOSIS — I2510 Atherosclerotic heart disease of native coronary artery without angina pectoris: Secondary | ICD-10-CM

## 2020-04-16 ENCOUNTER — Other Ambulatory Visit: Payer: Self-pay | Admitting: Internal Medicine

## 2020-04-16 DIAGNOSIS — I2510 Atherosclerotic heart disease of native coronary artery without angina pectoris: Secondary | ICD-10-CM

## 2020-04-25 NOTE — Progress Notes (Signed)
Cardiology Office Note   Date:  04/26/2020   ID:  Jocelyn Shelton, DOB 1955/08/04, MRN [id number]  PCP:  Joana Reamer, DO  Cardiologist:   Dietrich Pates, MD   F/U of CAD     History of Present Illness: Jocelyn Shelton is a 64 y.o. female with a history of CAD (s/p NSTEMI in 2016:  Cath pt underwent PCI/DES to P RCA; PCI/DES to LCx)  Also a history of HTN, discoid lupus  SOB, hyponatremia  And syncope (felt orthostatic)   Echo in Nov 2018 showed normal LVEF   No signif valve abnormalities    I saw the pt in Sept 2020   She was seen by [place] after  For BP control   Recomm starting diltiazem     Since seen she denies CP   Breathing is OK   She denies dizziness     Current Meds  Medication Sig  . Acetaminophen-Codeine 300-30 MG tablet Take by mouth.  . Belimumab (BENLYSTA) 200 MG/ML SOAJ INJECT 1 PEN UNDER THE SKIN EVERY 7 DAYS.  Marland Kitchen triamcinolone cream (KENALOG) 0.1 % Apply twice a day on affected areas. Do not use on face     Allergies:   Ticagrelor, Cymbalta [duloxetine hcl], Lisinopril, Olanzapine, and Tomato   Past Medical History:  Diagnosis Date  . Avascular necrosis of humeral head, left (HCC) 06/17/2017  . Avascular necrosis of humeral head, right (HCC) 06/17/2017  . Bronchiectasis (HCC) 06/17/2017  . CAD in native artery 05/23/2015  . High cholesterol   . Hypertension   . Leg pain    ABIs 1/18: R 1.2, L 1.1  . Myocardial infarction (HCC) 04/2015  . Raynauds syndrome 09/23/2006   Qualifier: Diagnosis of  By: Tressia Danas  MD, Adlih    . Reflux   . Rheumatoid arthritis (HCC)    "all over" (06/16/2017)  . Scleroderma [facility])     Past Surgical History:  Procedure Laterality Date  . APPENDECTOMY  90's  . CARDIAC CATHETERIZATION N/A 05/22/2015   Procedure: Left Heart Cath and Coronary Angiography;  Surgeon: Lennette Bihari, MD;  Location: [facility] INVASIVE CV LAB;  Service: Cardiovascular;  Laterality: N/A;  . CARDIAC CATHETERIZATION N/A 05/22/2015   Procedure:  Coronary Stent Intervention;  Surgeon: Lennette Bihari, MD;  Location: [facility] INVASIVE CV LAB;  Service: Cardiovascular;  Laterality: N/A;     Social History:  The patient  reports that she has been smoking cigarettes. She has a 23.00 pack-year smoking history. She has never used smokeless tobacco. She reports current alcohol use of about 4.0 standard drinks of alcohol per week. She reports that she does not use drugs.   Family History:  The patient's family history includes Diabetes in her daughter and sister; Heart attack in her mother; Hypertension in her father; Lung cancer (age of onset: 56) in her brother.    ROS:  Please see the history of present illness. All other systems are reviewed and  Negative to the above problem except as noted.    PHYSICAL EXAM: VS:  BP 140/90   Pulse 100   Ht 5\' 6"  (1.676 m)   Wt 173 lb (78.5 kg)   SpO2 97%   BMI 27.92 kg/m   GEN: Well nourished, well developed, in no acute distress  HEENT: normal  Neck:  JVP is normal    No carotid bruits  Cardiac: RRR; no murmurs;  No LE  edema  Respiratory Rhonchi bilateral, mild wheeze GI: soft, nontender,  nondistended, + BS  No hepatomegaly  MS: no deformity Moving all extremities   Skin: warm and dry,    Neuro:  Strength and sensation are intact Psych: euthymic mood, full affect   EKG:  EKG is not ordered today.   Lipid Panel    Component Value Date/Time   CHOL 105 04/26/2020 1231   TRIG 104 04/26/2020 1231   HDL 40 04/26/2020 1231   CHOLHDL 2.6 04/26/2020 1231   CHOLHDL 2.5 06/17/2017 0559   VLDL 8 06/17/2017 0559   LDLCALC 45 04/26/2020 1231      Wt Readings from Last 3 Encounters:  04/26/20 173 lb (78.5 kg)  07/05/19 175 lb 6.4 oz (79.6 kg)  04/14/19 166 lb (75.3 kg)      ASSESSMENT AND PLAN:  1  CAD    Pt denies symptoms of angina   Keep on same meds    2  HTN BP remains high    Would recomm increasing diltiazem to 180 mg daily  Cotinue other meds   Plan for f/u in HTN clinic in  December  3  HL  Lipids Will get lipids today  In 2020 LDL was 51  HDL 38    4   Hx of Hyponatremia   Check BMET  5  Tob abuse   Counselled on cessation  Plan for f/u in HTN clini c  F/U with me in May 2022  Current medicines are reviewed at length with the patient today.  The patient does not have concerns regarding medicines.  Signed, Dietrich Pates, MD  04/26/2020 8:42 PM    [facility] [address] [place], [place], [place]  [address] Phone: [phone]; Fax: [phone]

## 2020-04-26 ENCOUNTER — Other Ambulatory Visit: Payer: Self-pay

## 2020-04-26 ENCOUNTER — Ambulatory Visit (INDEPENDENT_AMBULATORY_CARE_PROVIDER_SITE_OTHER): Payer: Medicare Other | Admitting: Internal Medicine

## 2020-04-26 ENCOUNTER — Encounter: Payer: Self-pay | Admitting: Internal Medicine

## 2020-04-26 VITALS — BP 140/90 | HR 100 | Ht 66.0 in | Wt 173.0 lb

## 2020-04-26 DIAGNOSIS — E782 Mixed hyperlipidemia: Secondary | ICD-10-CM

## 2020-04-26 DIAGNOSIS — I2510 Atherosclerotic heart disease of native coronary artery without angina pectoris: Secondary | ICD-10-CM

## 2020-04-26 LAB — BASIC METABOLIC PANEL
BUN/Creatinine Ratio: 6 — ABNORMAL LOW (ref 12–28)
BUN: 6 mg/dL — ABNORMAL LOW (ref 8–27)
CO2: 29 mmol/L (ref 20–29)
Calcium: 8.8 mg/dL (ref 8.7–10.3)
Chloride: 98 mmol/L (ref 96–106)
Creatinine, Ser: 1.02 mg/dL — ABNORMAL HIGH (ref 0.57–1.00)
GFR calc Af Amer: 67 mL/min/{1.73_m2} (ref >59)
GFR calc non Af Amer: 58 mL/min/{1.73_m2} — ABNORMAL LOW (ref >59)
Glucose: 117 mg/dL — ABNORMAL HIGH (ref 65–99)
Potassium: 3.6 mmol/L (ref 3.5–5.2)
Sodium: 142 mmol/L (ref 134–144)

## 2020-04-26 LAB — CBC
Hematocrit: 36.9 % (ref 34.0–46.6)
Hemoglobin: 12.6 g/dL (ref 11.1–15.9)
MCH: 32.8 pg (ref 26.6–33.0)
MCHC: 34.1 g/dL (ref 31.5–35.7)
MCV: 96 fL (ref 79–97)
Platelets: 206 10*3/uL (ref 150–450)
RBC: 3.84 x10E6/uL (ref 3.77–5.28)
RDW: 12.6 % (ref 11.7–15.4)
WBC: 6.1 10*3/uL (ref 3.4–10.8)

## 2020-04-26 LAB — LIPID PANEL
Chol/HDL Ratio: 2.6 ratio (ref 0.0–4.4)
Cholesterol, Total: 105 mg/dL (ref 100–199)
HDL: 40 mg/dL (ref >39)
LDL Chol Calc (NIH): 45 mg/dL (ref 0–99)
Triglycerides: 104 mg/dL (ref 0–149)
VLDL Cholesterol Cal: 20 mg/dL (ref 5–40)

## 2020-04-26 MED ORDER — DILTIAZEM HCL ER COATED BEADS 180 MG PO CP24: 180.0000 mg | ORAL_CAPSULE | Freq: Every day | ORAL | 3 refills | Status: DC

## 2020-04-26 NOTE — Patient Instructions (Signed)
Medication Instructions:  Your physician has recommended you make the following change in your medication:  1.) increase diltiazem to 180 mg - one tablet once daily  *If you need a refill on your cardiac medications before your next appointment, please call your pharmacy*   Lab Work: Today: cbc, bmet, lipids  If you have labs (blood work) drawn today and your tests are completely normal, you will receive your results only by:  MyChart Message (if you have MyChart) OR  A paper copy in the mail If you have any lab test that is abnormal or we need to change your treatment, we will call you to review the results.   Testing/Procedures: none   Follow-Up: At [facility], you and your health needs are our priority.  As part of our continuing mission to provide you with exceptional heart care, we have created designated Provider Care Teams.  These Care Teams include your primary Cardiologist (physician) and Advanced Practice Providers (APPs -  Physician Assistants and Nurse Practitioners) who all work together to provide you with the care you need, when you need it.  We recommend signing up for the patient portal called "MyChart".  Sign up information is provided on this After Visit Summary.  MyChart is used to connect with patients for Virtual Visits (Telemedicine).  Patients are able to view lab/test results, encounter notes, upcoming appointments, etc.  Non-urgent messages can be sent to your provider as well.   To learn more about what you can do with MyChart, go to [email or web address].    Your next appointment:   7 month(s) May 2022  The format for your next appointment:   In Person  Provider:   You may see Dietrich Pates, MD or one of the following Advanced Practice Providers on your designated Care Team:    Tereso Newcomer, PA-C  Chelsea Aus, [place]    Other Instructions You have been referred to the Hypertension Clinic Pharmacist.  Appointment to be scheduled in December,  2021.

## 2020-05-03 ENCOUNTER — Other Ambulatory Visit: Payer: Self-pay | Admitting: Family Medicine

## 2020-05-03 DIAGNOSIS — Z1231 Encounter for screening mammogram for malignant neoplasm of breast: Secondary | ICD-10-CM

## 2020-05-06 ENCOUNTER — Ambulatory Visit (INDEPENDENT_AMBULATORY_CARE_PROVIDER_SITE_OTHER): Payer: Medicare Other | Admitting: Podiatry

## 2020-05-06 ENCOUNTER — Encounter: Payer: Self-pay | Admitting: Podiatry

## 2020-05-06 ENCOUNTER — Other Ambulatory Visit: Payer: Self-pay

## 2020-05-06 DIAGNOSIS — M79674 Pain in right toe(s): Secondary | ICD-10-CM | POA: Diagnosis not present

## 2020-05-06 DIAGNOSIS — B351 Tinea unguium: Secondary | ICD-10-CM | POA: Diagnosis not present

## 2020-05-06 DIAGNOSIS — I739 Peripheral vascular disease, unspecified: Secondary | ICD-10-CM | POA: Diagnosis not present

## 2020-05-06 DIAGNOSIS — M79675 Pain in left toe(s): Secondary | ICD-10-CM | POA: Diagnosis not present

## 2020-05-06 DIAGNOSIS — M349 Systemic sclerosis, unspecified: Secondary | ICD-10-CM

## 2020-05-06 DIAGNOSIS — Q828 Other specified congenital malformations of skin: Secondary | ICD-10-CM | POA: Diagnosis not present

## 2020-05-06 NOTE — Progress Notes (Signed)
Subjective:  Patient ID: Jocelyn Shelton, female    DOB: 1956/07/02,  MRN: [id number]  Jocelyn Shelton presents to clinic today for at risk foot care. Patient has history of scleroderma, Raynaud's, RA and chronic immunosuppression and painful porokeratotic lesion(s) right foot and painful mycotic toenails that limit ambulation. Painful toenails interfere with ambulation. Aggravating factors include wearing enclosed shoe gear. Pain is relieved with periodic professional debridement. Painful porokeratotic lesions are aggravated when weightbearing with and without shoegear. Pain is relieved with periodic professional debridement.   Patient states she continues to smoke 1/2 ppd.  She voices no new pedal complaints.  Review of Systems: Negative except as noted in the HPI. Past Medical History:  Diagnosis Date  . Avascular necrosis of humeral head, left (HCC) 06/17/2017  . Avascular necrosis of humeral head, right (HCC) 06/17/2017  . Bronchiectasis (HCC) 06/17/2017  . CAD in native artery 05/23/2015  . High cholesterol   . Hypertension   . Leg pain    ABIs 1/18: R 1.2, L 1.1  . Myocardial infarction (HCC) 04/2015  . Raynauds syndrome 09/23/2006   Qualifier: Diagnosis of  By: Tressia Danas  MD, Adlih    . Reflux   . Rheumatoid arthritis (HCC)    "all over" (06/16/2017)  . Scleroderma [facility])    Past Surgical History:  Procedure Laterality Date  . APPENDECTOMY  90's  . CARDIAC CATHETERIZATION N/A 05/22/2015   Procedure: Left Heart Cath and Coronary Angiography;  Surgeon: Lennette Bihari, MD;  Location: [facility] INVASIVE CV LAB;  Service: Cardiovascular;  Laterality: N/A;  . CARDIAC CATHETERIZATION N/A 05/22/2015   Procedure: Coronary Stent Intervention;  Surgeon: Lennette Bihari, MD;  Location: [facility] INVASIVE CV LAB;  Service: Cardiovascular;  Laterality: N/A;    Current Outpatient Medications:  .  Acetaminophen-Codeine 300-30 MG tablet, Take by mouth., Disp: , Rfl:  .  albuterol (PROVENTIL HFA) 108 (90  Base) MCG/ACT inhaler, INHALE TWO PUFFS BY MOUTH EVERY 4 HOURS AS NEEDED FOR WHEEZING FOR COUGH FOR SHORTNESS OF BREATH, Disp: 7 each, Rfl: 1 .  aspirin EC 81 MG EC tablet, Take 1 tablet (81 mg total) by mouth daily., Disp: , Rfl:  .  atorvastatin (LIPITOR) 40 MG tablet, Take 1 tablet (40 mg total) by mouth daily at 6 PM. Please make yearly appt with Dr. Tenny Craw for September for future refills. 1st attempt, Disp: 90 tablet, Rfl: 0 .  Belimumab (BENLYSTA) 200 MG/ML SOAJ, INJECT 1 PEN UNDER THE SKIN EVERY 7 DAYS., Disp: , Rfl:  .  Black Cohosh 40 MG CAPS, Take 1 capsule by mouth daily. , Disp: , Rfl:  .  Calcium Carb-Cholecalciferol (CALCIUM 600+D3 PO), Take 1 tablet by mouth daily., Disp: , Rfl:  .  carvedilol (COREG) 25 MG tablet, TAKE 1 TABLET (25 MG TOTAL) BY MOUTH 2 (TWO) TIMES DAILY WITH A MEAL., Disp: 180 tablet, Rfl: 1 .  clopidogrel (PLAVIX) 75 MG tablet, Take 1 tablet (75 mg total) by mouth daily. 2nd attempt. Please schedule office visit before any future refills, Disp: 15 tablet, Rfl: 0 .  diltiazem (CARDIZEM CD) 180 MG 24 hr capsule, Take 1 capsule (180 mg total) by mouth daily., Disp: 90 capsule, Rfl: 3 .  fluticasone (FLONASE) 50 MCG/ACT nasal spray, USE TWO SPRAY(S) IN EACH NOSTRIL DAILY AS NEEDED FOR  RHINITIS, Disp: 16 g, Rfl: 2 .  folic acid (FOLVITE) 1 MG tablet, TAKE 1 TABLET (1 MG TOTAL) BY MOUTH DAILY. (Patient taking differently: 800 mcg daily. ), Disp: 30 tablet,  Rfl: 6 .  hydrochlorothiazide (HYDRODIURIL) 25 MG tablet, Take 0.5 tablets (12.5 mg total) by mouth daily., Disp: 30 tablet, Rfl: 5 .  LUMIGAN 0.01 % SOLN, INSTILL 1 DROP INTO BOTH EYES EVERY EVENING, Disp: , Rfl: 6 .  Multiple Vitamin (MULTIVITAMIN WITH MINERALS) TABS tablet, Take 1 tablet by mouth daily., Disp: , Rfl:  .  nitroGLYCERIN (NITROSTAT) 0.4 MG SL tablet, PLACE 1 TABLET UNDER THE TONGUE EVERY 5 MINUTES AS NEEDED FOR CHEST PAIN, Disp: 25 tablet, Rfl: 5 .  OLANZapine (ZYPREXA) 5 MG tablet, TAKE 1 TABLET BY MOUTH  EVERYDAY AT BEDTIME, Disp: 90 tablet, Rfl: 3 .  predniSONE (DELTASONE) 1 MG tablet, TAKE 3 TABLETS BY MOUTH EVERY DAY, Disp: , Rfl:  .  sulfaSALAzine (AZULFIDINE) 500 MG tablet, Take 2 tablets (1,000 mg total) by mouth 2 (two) times daily., Disp: 120 tablet, Rfl: 0 .  Tiotropium Bromide-Olodaterol (STIOLTO RESPIMAT) 2.5-2.5 MCG/ACT AERS, Inhale into the lungs., Disp: , Rfl:  .  triamcinolone cream (KENALOG) 0.1 %, Apply twice a day on affected areas. Do not use on face, Disp: , Rfl:  .  valsartan (DIOVAN) 320 MG tablet, Take 1 tablet (320 mg total) by mouth daily., Disp: 90 tablet, Rfl: 3 Allergies  Allergen Reactions  . Ticagrelor Shortness Of Breath  . Cymbalta [Duloxetine Hcl] Diarrhea and Nausea Only    No appetite, stomach pain  . Lisinopril Cough  . Olanzapine Other (See Comments)    Interfered with Blood pressure medications Interfered with Blood pressure medications  . Tomato Rash   Social History   Occupational History  . Occupation: Retired  Tobacco Use  . Smoking status: Current Every Day Smoker    Packs/day: 0.50    Years: 46.00    Pack years: 23.00    Types: Cigarettes  . Smokeless tobacco: Never Used  Vaping Use  . Vaping Use: Never used  Substance and Sexual Activity  . Alcohol use: Yes    Alcohol/week: 4.0 standard drinks    Types: 4 Standard drinks or equivalent per week  . Drug use: No  . Sexual activity: Never    Objective:   Constitutional Jocelyn Shelton is a pleasant 64 y.o. African American female, WD, WN in NAD.Marland Kitchen AAO x 3.   Vascular Capillary fill time to digits <3 seconds b/l lower extremities. Faintly palpable DP pulse(s) b/l lower extremities. Nonpalpable PT pulse(s) b/l lower extremities. Pedal hair absent. Lower extremity skin temperature gradient within normal limits. No pain with calf compression b/l. No edema noted b/l lower extremities. No ischemia or gangrene noted b/l lower extremities.  No cyanosis or clubbing noted.  Neurologic Normal  speech. Oriented to person, place, and time. Protective sensation intact 5/5 intact bilaterally with 10g monofilament b/l. Clonus negative b/l.  Dermatologic Pedal skin is thin and atrophic b/l lower extremities. No open wounds bilaterally. No interdigital macerations bilaterally. Toenails 1-5 b/l elongated, discolored, dystrophic, thickened, crumbly with subungual debris and tenderness to dorsal palpation. Porokeratotic lesion(s) R hallux, submet head 1 right foot and submet head 5 right foot. No erythema, no edema, no drainage, no flocculence.  Orthopedic: Normal muscle strength 5/5 to all lower extremity muscle groups bilaterally. Utilizes cane for ambulation assistance.   Radiographs: None Assessment:   1. Porokeratosis   2. Pain due to onychomycosis of toenails of both feet   3. Scleroderma (HCC)   4. PAD (peripheral artery disease) (HCC)    Plan:  Patient was evaluated and treated and all questions answered.  Onychomycosis with  pain -Nails palliatively debridement as below -Educated on self-care  Procedure: Nail Debridement Rationale: Pain Type of Debridement: manual, sharp debridement. Instrumentation: Nail nipper, rotary burr. Number of Nails: 10 -Examined patient. -Toenails 1-5 b/l were debrided in length and girth with sterile nail nippers and dremel without iatrogenic bleeding.  -Painful porokeratotic lesion(s) R hallux, submet head 1 left foot and submet head 5 right foot pared and enucleated with sterile scalpel blade without incident. Non-medicated callus pad applied to submet head 5 right and digital toe cap applied to right hallux. -Patient to report any pedal injuries to medical professional immediately. -Patient to continue soft, supportive shoe gear daily. -Patient/POA to call should there be question/concern in the interim.  Return in about 3 months (around 08/06/2020).  Freddie Breech, DPM

## 2020-05-09 ENCOUNTER — Other Ambulatory Visit: Payer: Self-pay | Admitting: Internal Medicine

## 2020-05-09 ENCOUNTER — Telehealth: Payer: Self-pay | Admitting: Internal Medicine

## 2020-05-09 ENCOUNTER — Encounter: Payer: Self-pay | Admitting: *Deleted

## 2020-05-09 DIAGNOSIS — I2510 Atherosclerotic heart disease of native coronary artery without angina pectoris: Secondary | ICD-10-CM

## 2020-05-09 NOTE — Telephone Encounter (Signed)
Patient's daughter returning call for lab results. °

## 2020-05-09 NOTE — Telephone Encounter (Signed)
Reviewed lab results w patient and her daughter.  Aware a copy is coming in the mail.

## 2020-05-19 ENCOUNTER — Other Ambulatory Visit: Payer: Self-pay | Admitting: Internal Medicine

## 2020-05-19 DIAGNOSIS — E785 Hyperlipidemia, unspecified: Secondary | ICD-10-CM

## 2020-05-30 ENCOUNTER — Ambulatory Visit
Admission: RE | Admit: 2020-05-30 | Discharge: 2020-05-30 | Disposition: A | Discharge status: Home / Self Care | Payer: Medicare Other | Source: Ambulatory Visit | Attending: Family Medicine | Admitting: Family Medicine

## 2020-05-30 ENCOUNTER — Other Ambulatory Visit: Payer: Self-pay

## 2020-05-30 DIAGNOSIS — Z1231 Encounter for screening mammogram for malignant neoplasm of breast: Secondary | ICD-10-CM

## 2020-06-01 ENCOUNTER — Encounter (HOSPITAL_COMMUNITY): Payer: Self-pay | Admitting: Family Medicine

## 2020-06-06 ENCOUNTER — Other Ambulatory Visit: Payer: Self-pay | Admitting: Internal Medicine

## 2020-06-06 ENCOUNTER — Other Ambulatory Visit: Payer: Self-pay | Admitting: Family Medicine

## 2020-07-03 ENCOUNTER — Ambulatory Visit (INDEPENDENT_AMBULATORY_CARE_PROVIDER_SITE_OTHER): Payer: Medicare Other | Admitting: Pharmacist

## 2020-07-03 ENCOUNTER — Other Ambulatory Visit: Payer: Self-pay

## 2020-07-03 VITALS — BP 146/90 | HR 62

## 2020-07-03 DIAGNOSIS — I2510 Atherosclerotic heart disease of native coronary artery without angina pectoris: Secondary | ICD-10-CM

## 2020-07-03 DIAGNOSIS — I10 Essential (primary) hypertension: Secondary | ICD-10-CM

## 2020-07-03 NOTE — Patient Instructions (Signed)
It was nice to see you today!  Please continue taking carvedilol 25 mg BID, HCTZ 12.5 mg daily, valsartan 320 mg daily and diltiazem 180mg  daily. Please ask your daughter to call me at [phone] to discuss your medications/blood pressure

## 2020-07-03 NOTE — Progress Notes (Signed)
Patient ID: Jocelyn Shelton                 DOB: 1955-09-09                      MRN: [id number]     HPI: Jocelyn Shelton is a 64 y.o. female referred by Dr. Tenny Craw to HTN clinic. PMH is significant for CAD (s/p NSTEMI in 2016), PCI/DES to P RCA and LCx, HLD, HTN, SOB, lupus, hyponatremia (Na 137 - 04/14/19, 131 - 03/28/19).   Patient was seen in HTN in Dec 2020 where diltiazem 120mg  daily was started. She was lost to follow up. She saw Dr. on 04/26/20. BP was 140/90. Diltiazem was increased to 180mg  and she was referred back to HTN clinic.   Patient presents to HTN clinic today alone. She seemed confused and was unable to answer most of my questions or her answers contradicted themselves. She requested I call her daughter. I tried to call her daughter but her phone went to voicemail all 3 times I tried. I did leave her a message. I was able to connect with her after her mom left @ [phone] Daughter checks her moms blood pressure. Reports it running 120-130/80-90. States her mom's memory is declining and she helps her with her pills. Patient takes some of her medications in her own but daughter gives her some of them so she doesn't accidentally take twice.   Patient denies dizziness, lightheadedness, headache, blurred vision, SOB or swelling. BP in clinic was 146/90 then 152/80 on repeat. Daughter states her mom gets nervous in the clinic and BP goes up.    Current HTN meds: Carvedilol 25 mg BID, HCTZ 12.5 mg daily, valsartan 320 mg daily and diltiazem 180mg  daily  Previously tried: Lisinopril 10 mg daily (cough), Losartan 50 mg daily, amlodipine 2.5 mg daily (bilateral foot swelling), Metoprolol tartrate 50 mg BID (switched to carvedilol), Maxzide 75-50 mg 0.5 mg daily, HCTZ 25mg  daily (known, but thought maybe decrease due to hyponatremia)  BP goal: < 130/26mmHg  Family History: Diabetes in her daughter and sister; Heart attack in her mother; Hypertension in her father; Lung cancer (age of  onset: 56) in her brother.   Social History: The patient reports that she has been smoking cigarettes. She has a 23.00 pack-year smoking history. She has never used smokeless tobacco. She is not ready to quit at this time. She reports current alcohol use of about 4.0 standard drinks of alcohol per week. She reports that she does not use drugs.   Diet: vegetables, steak, soups, wheat bread, bacon, eggs, oatmeal, sodas, beers, and water. Puts salt on her food, but doesn't think her daughter cooks with salt.  Exercise: Patient walks with a cane and has difficulty walking for long periods of time  Wt Readings from Last 3 Encounters:  04/26/20 173 lb (78.5 kg)  07/05/19 175 lb 6.4 oz (79.6 kg)  04/14/19 166 lb (75.3 kg)   BP Readings from Last 3 Encounters:  04/26/20 140/90  12/16/19 (!) 140/92  07/05/19 140/82   Pulse Readings from Last 3 Encounters:  04/26/20 100  12/16/19 80  07/05/19 66    Renal function: CrCl cannot be calculated (Patient's most recent lab result is older than the maximum 21 days allowed.).  Past Medical History:  Diagnosis Date   Avascular necrosis of humeral head, left (HCC) 06/17/2017   Avascular necrosis of humeral head, right (HCC) 06/17/2017   Bronchiectasis (HCC) 06/17/2017  CAD in native artery 05/23/2015   High cholesterol    Hypertension    Leg pain    ABIs 1/18: R 1.2, L 1.1   Myocardial infarction (HCC) 04/2015   Raynauds syndrome 09/23/2006   Qualifier: Diagnosis of  By: Moreno-Coll  MD, Adlih     Reflux    Rheumatoid arthritis (HCC)    "all over" (06/16/2017)   Scleroderma (HCC)     Current Outpatient Medications on File Prior to Visit  Medication Sig Dispense Refill   Acetaminophen-Codeine 300-30 MG tablet Take by mouth.     albuterol (PROVENTIL HFA) 108 (90 Base) MCG/ACT inhaler INHALE TWO PUFFS BY MOUTH EVERY 4 HOURS AS NEEDED FOR WHEEZING FOR COUGH FOR SHORTNESS OF BREATH 7 each 1   aspirin EC 81 MG EC tablet Take 1  tablet (81 mg total) by mouth daily.     atorvastatin (LIPITOR) 40 MG tablet Take 1 tablet (40 mg total) by mouth daily at 6 PM. 90 tablet 3   Belimumab (BENLYSTA) 200 MG/ML SOAJ INJECT 1 PEN UNDER THE SKIN EVERY 7 DAYS.     Black Cohosh 40 MG CAPS Take 1 capsule by mouth daily.      Calcium Carb-Cholecalciferol (CALCIUM 600+D3 PO) Take 1 tablet by mouth daily.     carvedilol (COREG) 25 MG tablet TAKE 1 TABLET (25 MG TOTAL) BY MOUTH 2 (TWO) TIMES DAILY WITH A MEAL. 180 tablet 1   clopidogrel (PLAVIX) 75 MG tablet TAKE ONE TABLET BY MOUTH DAILY 90 tablet 3   diltiazem (CARDIZEM CD) 180 MG 24 hr capsule Take 1 capsule (180 mg total) by mouth daily. 90 capsule 3   fluticasone (FLONASE) 50 MCG/ACT nasal spray USE TWO SPRAY(S) IN EACH NOSTRIL DAILY AS NEEDED FOR  RHINITIS 16 g 2   folic acid (FOLVITE) 1 MG tablet TAKE 1 TABLET (1 MG TOTAL) BY MOUTH DAILY. (Patient taking differently: 800 mcg daily. ) 30 tablet 6   hydrochlorothiazide (HYDRODIURIL) 25 MG tablet Take 0.5 tablets (12.5 mg total) by mouth daily. 30 tablet 5   LUMIGAN 0.01 % SOLN INSTILL 1 DROP INTO BOTH EYES EVERY EVENING  6   Multiple Vitamin (MULTIVITAMIN WITH MINERALS) TABS tablet Take 1 tablet by mouth daily.     nitroGLYCERIN (NITROSTAT) 0.4 MG SL tablet PLACE 1 TABLET UNDER THE TONGUE EVERY 5 MINUTES AS NEEDED FOR CHEST PAIN 25 tablet 5   OLANZapine (ZYPREXA) 5 MG tablet TAKE 1 TABLET BY MOUTH EVERYDAY AT BEDTIME 90 tablet 3   predniSONE (DELTASONE) 1 MG tablet TAKE 3 TABLETS BY MOUTH EVERY DAY     sulfaSALAzine (AZULFIDINE) 500 MG tablet Take 2 tablets (1,000 mg total) by mouth 2 (two) times daily. 120 tablet 0   Tiotropium Bromide-Olodaterol (STIOLTO RESPIMAT) 2.5-2.5 MCG/ACT AERS Inhale into the lungs.     triamcinolone cream (KENALOG) 0.1 % Apply twice a day on affected areas. Do not use on face     valsartan (DIOVAN) 320 MG tablet TAKE 1 TABLET BY MOUTH EVERY DAY 90 tablet 3   No current  facility-administered medications on file prior to visit.    Allergies  Allergen Reactions   Ticagrelor Shortness Of Breath   Cymbalta [Duloxetine Hcl] Diarrhea and Nausea Only    No appetite, stomach pain   Lisinopril Cough   Olanzapine Other (See Comments)    Interfered with Blood pressure medications Interfered with Blood pressure medications   Tomato Rash     Assessment/Plan:  1. Hypertension - Blood pressure is above  goal of <130/80 in clinic today. Sounds like blood pressure could be close to or at goal at home but no exact readings were given. Daughter was resistant to coming back to office anytime soon as both she the patient and her son have appointment in the next few months. She is agreeable to a phone follow up in January. Daughter will check patients blood pressure once a day and record the readings for me so she can give them to me in Jan. No medication changes today.   Thank you,  Olene Floss, Pharm.D, BCPS, CPP [facility]  [address] [place], [place] [address]  Phone: [phone]; Fax: [phone]

## 2020-07-09 ENCOUNTER — Other Ambulatory Visit: Payer: Self-pay | Admitting: Family Medicine

## 2020-07-09 ENCOUNTER — Encounter: Payer: Self-pay | Admitting: Podiatry

## 2020-07-09 DIAGNOSIS — I10 Essential (primary) hypertension: Secondary | ICD-10-CM

## 2020-07-12 ENCOUNTER — Other Ambulatory Visit: Payer: Self-pay | Admitting: Family Medicine

## 2020-07-12 ENCOUNTER — Other Ambulatory Visit: Payer: Self-pay | Admitting: Internal Medicine

## 2020-07-12 DIAGNOSIS — I10 Essential (primary) hypertension: Secondary | ICD-10-CM

## 2020-08-12 ENCOUNTER — Ambulatory Visit: Payer: Medicare Other | Admitting: Podiatry

## 2020-08-23 ENCOUNTER — Telehealth: Payer: Self-pay | Admitting: Pharmacist

## 2020-08-23 NOTE — Telephone Encounter (Signed)
Called pt daughter April to follow up on her mom's blood pressure. No answer, left VM for her to call back

## 2020-11-29 ENCOUNTER — Ambulatory Visit (INDEPENDENT_AMBULATORY_CARE_PROVIDER_SITE_OTHER): Payer: Medicare Other | Admitting: Podiatry

## 2020-11-29 ENCOUNTER — Encounter: Payer: Self-pay | Admitting: Podiatry

## 2020-11-29 ENCOUNTER — Other Ambulatory Visit: Payer: Self-pay

## 2020-11-29 DIAGNOSIS — M79675 Pain in left toe(s): Secondary | ICD-10-CM

## 2020-11-29 DIAGNOSIS — Q828 Other specified congenital malformations of skin: Secondary | ICD-10-CM | POA: Diagnosis not present

## 2020-11-29 DIAGNOSIS — I739 Peripheral vascular disease, unspecified: Secondary | ICD-10-CM

## 2020-11-29 DIAGNOSIS — D689 Coagulation defect, unspecified: Secondary | ICD-10-CM | POA: Insufficient documentation

## 2020-11-29 DIAGNOSIS — B351 Tinea unguium: Secondary | ICD-10-CM | POA: Diagnosis not present

## 2020-11-29 DIAGNOSIS — M79674 Pain in right toe(s): Secondary | ICD-10-CM

## 2020-11-29 NOTE — Progress Notes (Signed)
This patient returns to my office for at risk foot care.  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 Patient is taking plavix.  This patient is unable to cut nails herself since the patient cannot reach her nails.These nails are painful walking and wearing shoes. Painful callus on the outside right foot.   This patient presents for at risk foot care today.  General Appearance  Alert, conversant and in no acute stress.  Vascular  Dorsalis pedis and posterior tibial  pulses are not palpable  bilaterally.  Capillary return is within normal limits  bilaterally. Temperature is within normal limits  bilaterally.  Neurologic  Senn-Weinstein monofilament wire test within normal limits  bilaterally. Muscle power within normal limits bilaterally.  Nails Thick disfigured discolored nails with subungual debris  from hallux to fifth toes bilaterally. No evidence of bacterial infection or drainage bilaterally.  Orthopedic  No limitations of motion  feet .  No crepitus or effusions noted.  No bony pathology or digital deformities noted.  Skin  normotropic skin noted bilaterally.  No signs of infections or ulcers noted.   Porokeratosis sub 5th right foot.  Onychomycosis  Pain in right toes  Pain in left toes  Porokeratosis  Right foot.  Consent was obtained for treatment procedures.   Mechanical debridement of nails 1-5  bilaterally performed with a nail nipper.  Filed with dremel without incident.  Debride callus with # 15 blade right foot.   Return office visit   prn                  Told patient to return for periodic foot care and evaluation due to potential at risk complications.   Helane Gunther DPM

## 2021-01-22 ENCOUNTER — Other Ambulatory Visit: Payer: Self-pay | Admitting: Family Medicine

## 2021-01-22 DIAGNOSIS — I10 Essential (primary) hypertension: Secondary | ICD-10-CM

## 2021-01-30 ENCOUNTER — Other Ambulatory Visit: Payer: Self-pay | Admitting: Internal Medicine

## 2021-03-05 ENCOUNTER — Ambulatory Visit: Payer: Medicare Other | Admitting: Podiatry

## 2021-03-14 ENCOUNTER — Other Ambulatory Visit: Payer: Self-pay

## 2021-03-14 ENCOUNTER — Encounter: Payer: Self-pay | Admitting: Podiatry

## 2021-03-14 ENCOUNTER — Ambulatory Visit (INDEPENDENT_AMBULATORY_CARE_PROVIDER_SITE_OTHER): Payer: Medicare Other | Admitting: Podiatry

## 2021-03-14 DIAGNOSIS — B351 Tinea unguium: Secondary | ICD-10-CM

## 2021-03-14 DIAGNOSIS — Q828 Other specified congenital malformations of skin: Secondary | ICD-10-CM

## 2021-03-14 DIAGNOSIS — D689 Coagulation defect, unspecified: Secondary | ICD-10-CM

## 2021-03-14 DIAGNOSIS — M79674 Pain in right toe(s): Secondary | ICD-10-CM | POA: Diagnosis not present

## 2021-03-14 DIAGNOSIS — M79675 Pain in left toe(s): Secondary | ICD-10-CM

## 2021-03-14 DIAGNOSIS — M349 Systemic sclerosis, unspecified: Secondary | ICD-10-CM

## 2021-03-14 DIAGNOSIS — I739 Peripheral vascular disease, unspecified: Secondary | ICD-10-CM

## 2021-03-14 NOTE — Progress Notes (Signed)
This patient returns to my office for at risk foot care.  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 Patient is taking plavix.  This patient is unable to cut nails herself since the patient cannot reach her nails.These nails are painful walking and wearing shoes. Painful callus on the outside right foot.   This patient presents for at risk foot care today.  General Appearance  Alert, conversant and in no acute stress.  Vascular  Dorsalis pedis and posterior tibial  pulses are not palpable  bilaterally.  Capillary return is within normal limits  bilaterally. Temperature is within normal limits  bilaterally.  Neurologic  Senn-Weinstein monofilament wire test within normal limits  bilaterally. Muscle power within normal limits bilaterally.  Nails Thick disfigured discolored nails with subungual debris  from hallux to fifth toes bilaterally. No evidence of bacterial infection or drainage bilaterally.  Orthopedic  No limitations of motion  feet .  No crepitus or effusions noted.  No bony pathology or digital deformities noted.  Skin  normotropic skin noted bilaterally.  No signs of infections or ulcers noted.   Porokeratosis sub 5th right foot.  Onychomycosis  Pain in right toes  Pain in left toes  Porokeratosis  Right foot.  Consent was obtained for treatment procedures.   Mechanical debridement of nails 1-5  bilaterally performed with a nail nipper.  Filed with dremel without incident.  Debride callus with # 15 blade right foot.   Return office visit   prn                  Told patient to return for periodic foot care and evaluation due to potential at risk complications.   Thersa Mohiuddin DPM  

## 2021-04-11 ENCOUNTER — Other Ambulatory Visit: Payer: Self-pay | Admitting: Internal Medicine

## 2021-04-17 ENCOUNTER — Other Ambulatory Visit: Payer: Self-pay | Admitting: Internal Medicine

## 2021-04-17 DIAGNOSIS — E785 Hyperlipidemia, unspecified: Secondary | ICD-10-CM

## 2021-05-01 ENCOUNTER — Other Ambulatory Visit: Payer: Self-pay | Admitting: Internal Medicine

## 2021-05-01 DIAGNOSIS — I2510 Atherosclerotic heart disease of native coronary artery without angina pectoris: Secondary | ICD-10-CM

## 2021-05-13 ENCOUNTER — Telehealth: Payer: Self-pay | Admitting: *Deleted

## 2021-05-13 ENCOUNTER — Other Ambulatory Visit: Payer: Self-pay | Admitting: Internal Medicine

## 2021-05-13 DIAGNOSIS — E785 Hyperlipidemia, unspecified: Secondary | ICD-10-CM

## 2021-05-16 NOTE — Telephone Encounter (Signed)
Chart opened in error

## 2021-05-16 NOTE — Telephone Encounter (Signed)
Error

## 2021-05-30 ENCOUNTER — Other Ambulatory Visit: Payer: Self-pay | Admitting: Internal Medicine

## 2021-05-30 DIAGNOSIS — I2510 Atherosclerotic heart disease of native coronary artery without angina pectoris: Secondary | ICD-10-CM

## 2021-06-18 ENCOUNTER — Ambulatory Visit: Payer: Medicare Other | Admitting: Podiatry

## 2021-07-05 ENCOUNTER — Other Ambulatory Visit: Payer: Self-pay | Admitting: Family Medicine

## 2021-07-10 ENCOUNTER — Telehealth: Payer: Self-pay | Admitting: *Deleted

## 2021-07-10 NOTE — Telephone Encounter (Signed)
-----   Message from Bess Kinds, MD sent at 07/09/2021  3:40 AM EST ----- Regarding: Patient needs check up! Hey team!   It looks like it's been a while since this patient was in clinic. Can you reach out and have them scheduled for a check up?   Thank you!   -BS

## 2021-07-10 NOTE — Telephone Encounter (Signed)
LMOVM for pt to call back and make it an appt. Charli Liberatore Bruna Potter, CMA

## 2021-07-30 DIAGNOSIS — R413 Other amnesia: Secondary | ICD-10-CM | POA: Insufficient documentation

## 2021-07-30 NOTE — Patient Instructions (Signed)
It was great to see you today! Thank you for choosing [facility] for your primary care. Jocelyn Shelton was seen for memory concerns.  Our plans for today were:  -I have ordered several blood tests and an MRI.  I submitted a consult to neurology for her to follow-up as well.  Please follow-up with [place] in 2 weeks and bring all medications so that we may go over them as another cause for this may be medication related.  We are checking some labs today. If they are abnormal, I will call you. If they are normal, I will send you a MyChart message or a letter. If you do not hear about your labs in the next 2 weeks, please let us know.  You should return to our clinic in 2 weeks for memory concern follow-up and med rec.   I recommend that you always bring your medications to each appointment as this makes it easy to ensure you are on the correct medications and helps [place] not miss refills when you need them.  Please arrive 15 minutes before your appointment to ensure smooth check in process.  We appreciate your efforts in making this happen.  Take care and seek immediate care sooner if you develop any concerns.   Thank you for allowing me to participate in your care, Shelby Mattocks, DO 07/30/2021, 4:54 PM PGY-1, [facility]

## 2021-07-30 NOTE — Progress Notes (Signed)
SUBJECTIVE:   CHIEF COMPLAINT / HPI:   Memory concerns: Patient presents with her daughter who is concerned of change in mental status and function in the last few months.  States that 1 year ago she began to notice minimal changes in memory but 3 months ago patient began to have incontinence of both bowel and bladder in addition to responding minimally and conversation and recently having loss of appetite.  She has lost 20 pounds in the last few months.  At baseline she walks with a cane and may have balance difficulties but overall is still able to walk away she did prior.  She still much more talkative and would eat well.  Denies head trauma.  Endorses minimal alcohol intake and smoking, denies illicit drug use.  Entire HPI given by daughter.  PERTINENT  PMH / PSH: HTN, CAD, COPD, tobacco use disorder, RA, GERD  OBJECTIVE:  BP (!) 146/100    Pulse 78    Ht $R'5\' 6"'kf$  (1.676 m)    Wt 152 lb 12.8 oz (69.3 kg)    SpO2 96%    BMI 24.66 kg/m    General: awake and alert to person and time, not place, NAD, able to participate in exam, patient appears unaware and forgetful almost directly after command Cardiac: RRR, no murmurs. Respiratory: CTAB, normal effort, No wheezes, rales or rhonchi Abdomen: nontender, nondistended Neuro: alert, no obvious motor deficits but  Psych: Flat affect, pleasantly unaware  ASSESSMENT/PLAN:  Alteration of awareness Mini cog 0/5.  Stable but very concerning presentation given how rapidly it appears she has declined with setting of incontinence, lack of appetite, minimal awareness and flat affect. Patient appeared to forget physical exam commands almost directly after receiving them.  Likely too young and rapid for Alzheimer's dementia.  May be more consistent with vascular or frontotemporal dementia given faster onset.  Motor function appears unaffected thus far.  CBC, c-Met, folate, B12, TSH, RPR.  Will obtain MRI brain without contrast and consult neurology.  Follow-up  in 2 weeks for further assessment and med rec. HIV unable to be ordered this visit, consider at next visit.   Return in about 2 weeks (around 08/14/2021) for Memory and med rec.  Wells Guiles, DO 07/31/2021, 1:56 PM PGY-1, [place]

## 2021-07-31 ENCOUNTER — Other Ambulatory Visit: Payer: Self-pay

## 2021-07-31 ENCOUNTER — Encounter: Payer: Self-pay | Admitting: Student

## 2021-07-31 ENCOUNTER — Ambulatory Visit (INDEPENDENT_AMBULATORY_CARE_PROVIDER_SITE_OTHER): Payer: Medicare Other | Admitting: Student

## 2021-07-31 ENCOUNTER — Ambulatory Visit (INDEPENDENT_AMBULATORY_CARE_PROVIDER_SITE_OTHER): Payer: Medicare Other

## 2021-07-31 VITALS — BP 146/100 | HR 78 | Ht 66.0 in | Wt 152.8 lb

## 2021-07-31 DIAGNOSIS — R419 Unspecified symptoms and signs involving cognitive functions and awareness: Secondary | ICD-10-CM

## 2021-07-31 DIAGNOSIS — Z23 Encounter for immunization: Secondary | ICD-10-CM

## 2021-07-31 DIAGNOSIS — R413 Other amnesia: Secondary | ICD-10-CM | POA: Diagnosis not present

## 2021-07-31 NOTE — Assessment & Plan Note (Addendum)
Mini cog 0/5.  Stable but very concerning presentation given how rapidly it appears she has declined with setting of incontinence, lack of appetite, minimal awareness and flat affect. Patient appeared to forget physical exam commands almost directly after receiving them.  Likely too young and rapid for Alzheimer's dementia.  May be more consistent with vascular or frontotemporal dementia given faster onset.  Motor function appears unaffected thus far.  CBC, c-Met, folate, B12, TSH, RPR.  Will obtain MRI brain without contrast and consult neurology.  Follow-up in 2 weeks for further assessment and med rec. HIV unable to be ordered this visit, consider at next visit.

## 2021-08-01 ENCOUNTER — Emergency Department (HOSPITAL_COMMUNITY): Payer: Medicare Other

## 2021-08-01 ENCOUNTER — Telehealth: Payer: Self-pay | Admitting: Student

## 2021-08-01 ENCOUNTER — Inpatient Hospital Stay (HOSPITAL_COMMUNITY)
Admission: EM | Admit: 2021-08-01 | Discharge: 2021-08-12 | DRG: 884 | Disposition: A | Discharge status: Skilled Nursing Facility | Payer: Medicare Other | Attending: Family Medicine | Admitting: Family Medicine

## 2021-08-01 ENCOUNTER — Encounter: Payer: Self-pay | Admitting: Student

## 2021-08-01 ENCOUNTER — Telehealth: Payer: Self-pay | Admitting: Internal Medicine

## 2021-08-01 DIAGNOSIS — E7800 Pure hypercholesterolemia, unspecified: Secondary | ICD-10-CM | POA: Diagnosis not present

## 2021-08-01 DIAGNOSIS — F17210 Nicotine dependence, cigarettes, uncomplicated: Secondary | ICD-10-CM | POA: Diagnosis present

## 2021-08-01 DIAGNOSIS — I272 Pulmonary hypertension, unspecified: Secondary | ICD-10-CM | POA: Diagnosis not present

## 2021-08-01 DIAGNOSIS — F03B3 Unspecified dementia, moderate, with mood disturbance: Principal | ICD-10-CM | POA: Diagnosis present

## 2021-08-01 DIAGNOSIS — R410 Disorientation, unspecified: Secondary | ICD-10-CM | POA: Diagnosis present

## 2021-08-01 DIAGNOSIS — I10 Essential (primary) hypertension: Secondary | ICD-10-CM

## 2021-08-01 DIAGNOSIS — Z20822 Contact with and (suspected) exposure to covid-19: Secondary | ICD-10-CM | POA: Diagnosis not present

## 2021-08-01 DIAGNOSIS — M069 Rheumatoid arthritis, unspecified: Secondary | ICD-10-CM | POA: Diagnosis present

## 2021-08-01 DIAGNOSIS — I2510 Atherosclerotic heart disease of native coronary artery without angina pectoris: Secondary | ICD-10-CM

## 2021-08-01 DIAGNOSIS — F329 Major depressive disorder, single episode, unspecified: Secondary | ICD-10-CM | POA: Diagnosis present

## 2021-08-01 DIAGNOSIS — Z8673 Personal history of transient ischemic attack (TIA), and cerebral infarction without residual deficits: Secondary | ICD-10-CM

## 2021-08-01 DIAGNOSIS — K219 Gastro-esophageal reflux disease without esophagitis: Secondary | ICD-10-CM | POA: Diagnosis not present

## 2021-08-01 DIAGNOSIS — E876 Hypokalemia: Secondary | ICD-10-CM

## 2021-08-01 DIAGNOSIS — Z8249 Family history of ischemic heart disease and other diseases of the circulatory system: Secondary | ICD-10-CM

## 2021-08-01 DIAGNOSIS — N179 Acute kidney failure, unspecified: Secondary | ICD-10-CM | POA: Diagnosis not present

## 2021-08-01 DIAGNOSIS — F05 Delirium due to known physiological condition: Secondary | ICD-10-CM | POA: Diagnosis present

## 2021-08-01 DIAGNOSIS — D849 Immunodeficiency, unspecified: Secondary | ICD-10-CM | POA: Diagnosis present

## 2021-08-01 DIAGNOSIS — R4182 Altered mental status, unspecified: Secondary | ICD-10-CM | POA: Diagnosis present

## 2021-08-01 DIAGNOSIS — I130 Hypertensive heart and chronic kidney disease with heart failure and stage 1 through stage 4 chronic kidney disease, or unspecified chronic kidney disease: Secondary | ICD-10-CM | POA: Diagnosis not present

## 2021-08-01 DIAGNOSIS — I5032 Chronic diastolic (congestive) heart failure: Secondary | ICD-10-CM | POA: Diagnosis present

## 2021-08-01 DIAGNOSIS — Z833 Family history of diabetes mellitus: Secondary | ICD-10-CM

## 2021-08-01 DIAGNOSIS — Z7401 Bed confinement status: Secondary | ICD-10-CM

## 2021-08-01 DIAGNOSIS — N1831 Chronic kidney disease, stage 3a: Secondary | ICD-10-CM | POA: Diagnosis present

## 2021-08-01 DIAGNOSIS — Z515 Encounter for palliative care: Secondary | ICD-10-CM | POA: Diagnosis not present

## 2021-08-01 DIAGNOSIS — Z66 Do not resuscitate: Secondary | ICD-10-CM | POA: Diagnosis not present

## 2021-08-01 DIAGNOSIS — E785 Hyperlipidemia, unspecified: Secondary | ICD-10-CM

## 2021-08-01 DIAGNOSIS — J432 Centrilobular emphysema: Secondary | ICD-10-CM | POA: Diagnosis present

## 2021-08-01 DIAGNOSIS — E871 Hypo-osmolality and hyponatremia: Secondary | ICD-10-CM | POA: Diagnosis present

## 2021-08-01 DIAGNOSIS — Z79899 Other long term (current) drug therapy: Secondary | ICD-10-CM | POA: Diagnosis not present

## 2021-08-01 DIAGNOSIS — R32 Unspecified urinary incontinence: Secondary | ICD-10-CM | POA: Diagnosis not present

## 2021-08-01 DIAGNOSIS — I252 Old myocardial infarction: Secondary | ICD-10-CM

## 2021-08-01 DIAGNOSIS — M349 Systemic sclerosis, unspecified: Secondary | ICD-10-CM | POA: Diagnosis present

## 2021-08-01 DIAGNOSIS — N182 Chronic kidney disease, stage 2 (mild): Secondary | ICD-10-CM

## 2021-08-01 DIAGNOSIS — F03B0 Unspecified dementia, moderate, without behavioral disturbance, psychotic disturbance, mood disturbance, and anxiety: Secondary | ICD-10-CM

## 2021-08-01 DIAGNOSIS — Z801 Family history of malignant neoplasm of trachea, bronchus and lung: Secondary | ICD-10-CM

## 2021-08-01 DIAGNOSIS — I7300 Raynaud's syndrome without gangrene: Secondary | ICD-10-CM | POA: Diagnosis present

## 2021-08-01 DIAGNOSIS — Z751 Person awaiting admission to adequate facility elsewhere: Secondary | ICD-10-CM

## 2021-08-01 LAB — CBG MONITORING, ED: Glucose-Capillary: 101 mg/dL — ABNORMAL HIGH (ref 70–99)

## 2021-08-01 LAB — CBC
Hematocrit: 37.3 % (ref 34.0–46.6)
Hemoglobin: 12.2 g/dL (ref 11.1–15.9)
MCH: 30.3 pg (ref 26.6–33.0)
MCHC: 32.7 g/dL (ref 31.5–35.7)
MCV: 93 fL (ref 79–97)
Platelets: 154 10*3/uL (ref 150–450)
RBC: 4.03 x10E6/uL (ref 3.77–5.28)
RDW: 12.3 % (ref 11.7–15.4)
WBC: 5.6 10*3/uL (ref 3.4–10.8)

## 2021-08-01 LAB — COMPREHENSIVE METABOLIC PANEL
ALT: 10 IU/L (ref 0–32)
ALT: 12 U/L (ref 0–44)
AST: 20 IU/L (ref 0–40)
AST: 22 U/L (ref 15–41)
Albumin/Globulin Ratio: 1.3 (ref 1.2–2.2)
Albumin: 4 g/dL (ref 3.8–4.8)
Albumin: 3.7 g/dL (ref 3.5–5.0)
Alkaline Phosphatase: 124 IU/L — ABNORMAL HIGH (ref 44–121)
Alkaline Phosphatase: 110 U/L (ref 38–126)
Anion gap: 10 (ref 5–15)
BUN/Creatinine Ratio: 6 — ABNORMAL LOW (ref 12–28)
BUN: 7 mg/dL — ABNORMAL LOW (ref 8–27)
BUN: 5 mg/dL — ABNORMAL LOW (ref 8–23)
Bilirubin Total: 0.5 mg/dL (ref 0.0–1.2)
CO2: 29 mmol/L (ref 20–29)
CO2: 28 mmol/L (ref 22–32)
Calcium: 9.1 mg/dL (ref 8.7–10.3)
Calcium: 8.4 mg/dL — ABNORMAL LOW (ref 8.9–10.3)
Chloride: 101 mmol/L (ref 96–106)
Chloride: 97 mmol/L — ABNORMAL LOW (ref 98–111)
Creatinine, Ser: 1.13 mg/dL — ABNORMAL HIGH (ref 0.57–1.00)
Creatinine, Ser: 1.05 mg/dL — ABNORMAL HIGH (ref 0.44–1.00)
GFR, Estimated: 59 mL/min — ABNORMAL LOW (ref >60)
Globulin, Total: 3 g/dL (ref 1.5–4.5)
Glucose, Bld: 101 mg/dL — ABNORMAL HIGH (ref 70–99)
Glucose: 95 mg/dL (ref 70–99)
Potassium: 3.3 mmol/L — ABNORMAL LOW (ref 3.5–5.2)
Potassium: 2.5 mmol/L — CL (ref 3.5–5.1)
Sodium: 145 mmol/L — ABNORMAL HIGH (ref 134–144)
Sodium: 135 mmol/L (ref 135–145)
Total Bilirubin: 0.9 mg/dL (ref 0.3–1.2)
Total Protein: 7 g/dL (ref 6.0–8.5)
Total Protein: 7.3 g/dL (ref 6.5–8.1)
eGFR: 54 mL/min/{1.73_m2} — ABNORMAL LOW (ref >59)

## 2021-08-01 LAB — CBC WITH DIFFERENTIAL/PLATELET
Abs Immature Granulocytes: 0.01 10*3/uL (ref 0.00–0.07)
Basophils Absolute: 0 10*3/uL (ref 0.0–0.1)
Basophils Relative: 0 %
Eosinophils Absolute: 0 10*3/uL (ref 0.0–0.5)
Eosinophils Relative: 0 %
HCT: 39.4 % (ref 36.0–46.0)
Hemoglobin: 13.4 g/dL (ref 12.0–15.0)
Immature Granulocytes: 0 %
Lymphocytes Relative: 14 %
Lymphs Abs: 0.6 10*3/uL — ABNORMAL LOW (ref 0.7–4.0)
MCH: 30.9 pg (ref 26.0–34.0)
MCHC: 34 g/dL (ref 30.0–36.0)
MCV: 90.8 fL (ref 80.0–100.0)
Monocytes Absolute: 0.4 10*3/uL (ref 0.1–1.0)
Monocytes Relative: 8 %
Neutro Abs: 3.6 10*3/uL (ref 1.7–7.7)
Neutrophils Relative %: 78 %
Platelets: 133 10*3/uL — ABNORMAL LOW (ref 150–400)
RBC: 4.34 MIL/uL (ref 3.87–5.11)
RDW: 12.2 % (ref 11.5–15.5)
WBC: 4.6 10*3/uL (ref 4.0–10.5)
nRBC: 0 % (ref 0.0–0.2)

## 2021-08-01 LAB — TSH: TSH: 2.1 u[IU]/mL (ref 0.450–4.500)

## 2021-08-01 LAB — VITAMIN B12: Vitamin B-12: 1084 pg/mL (ref 232–1245)

## 2021-08-01 LAB — RPR: RPR Ser Ql: NONREACTIVE

## 2021-08-01 LAB — FOLATE: Folate: >20 ng/mL (ref >3.0)

## 2021-08-01 MED ORDER — CLOPIDOGREL BISULFATE 75 MG PO TABS: 75.0000 mg | ORAL_TABLET | Freq: Every day | ORAL | 1 refills | Status: DC

## 2021-08-01 MED ORDER — POTASSIUM CHLORIDE CRYS ER 20 MEQ PO TBCR
40.0000 meq | EXTENDED_RELEASE_TABLET | Freq: Once | ORAL | Status: AC
Start: 2021-08-01 — End: 2021-08-01
  Administered 2021-08-01: 40 meq via ORAL
  Filled 2021-08-01: qty 2

## 2021-08-01 MED ORDER — POTASSIUM CHLORIDE 10 MEQ/100ML IV SOLN
10.0000 meq | INTRAVENOUS | Status: AC
  Administered 2021-08-02 (×4): 10 meq via INTRAVENOUS
  Filled 2021-08-01 (×4): qty 100

## 2021-08-01 MED ORDER — MAGNESIUM OXIDE -MG SUPPLEMENT 400 (240 MG) MG PO TABS
400.0000 mg | ORAL_TABLET | Freq: Once | ORAL | Status: AC
  Administered 2021-08-01: 400 mg via ORAL
  Filled 2021-08-01: qty 1

## 2021-08-01 MED ORDER — VALSARTAN 320 MG PO TABS: 320.0000 mg | ORAL_TABLET | Freq: Every day | ORAL | 1 refills | Status: DC

## 2021-08-01 MED ORDER — MAGNESIUM SULFATE 2 GM/50ML IV SOLN
2.0000 g | Freq: Once | INTRAVENOUS | Status: AC
Start: 2021-08-01 — End: 2021-08-02
  Administered 2021-08-02: 2 g via INTRAVENOUS
  Filled 2021-08-01: qty 50

## 2021-08-01 MED ORDER — ATORVASTATIN CALCIUM 40 MG PO TABS: 40.0000 mg | ORAL_TABLET | Freq: Every day | ORAL | 1 refills | Status: DC

## 2021-08-01 MED ORDER — NITROGLYCERIN 0.4 MG SL SUBL: SUBLINGUAL_TABLET | SUBLINGUAL | 0 refills | Status: DC

## 2021-08-01 NOTE — ED Notes (Signed)
Pt called multiple times for transport to MRI, no answer.

## 2021-08-01 NOTE — Telephone Encounter (Signed)
Spoke with patient's daughter regarding mostly normal lab results (low potassium and minimally increased creatinine, otherwise wnl). She informed me that her mother is now unable to get up and move which was not the case previously nor at her office visit yesterday. I spoke with Dr. Lum Babe about the case. Called back and advised them to go to ED for stroke/AMS workup. Differential includes stroke, seizure, advanced dementia. As she is unable to move patient herself, I advised them to call 911. Pt was recently scheduled for Neurology next week and pending MR brain w/o contrast. I advise these be considered if admitted for hospitalization.  As previously stated in clinic note, pt has had recent decline in awareness/affect in addition to bowel and bladder incontinence and lack of appetite. Previously unchanged motor function until the last 24 hours.

## 2021-08-01 NOTE — ED Provider Notes (Signed)
[facility] EMERGENCY DEPARTMENT Provider Note   CSN: [id number] Arrival date & time: 08/01/21  1524     History  Chief Complaint  Patient presents with   Weakness    Jocelyn Shelton is a 66 y.o. female.   Weakness  66 year old female with a history of HTN, scleroderma, CAD, HLD, rheumatoid arthritis who presents to the emergency department with altered mental status.  The patient was seen in her family medicine clinic and referred to the emergency department for further evaluation.  She was seen on 07/31/2021 for a progressive decline in mental status and function over the last few months.  Fellow members called yesterday and stated that symptoms have acutely worsened and she was recommended to present to the emergency department.  At baseline she walks with a cane and has some balance difficulties but his no longer able to walk given this acute change over the past few days.  She is also having episodes of urinary incontinence.  CBC, folate, B12, TSH and RPR were all checked outpatient.  Patient was referred to the emergency department for further work-up given this acute change.  On my evaluation, there were no family members bedside to provide further history and the patient was altered and unable to contribute.  Home Medications Prior to Admission medications   Medication Sig Start Date End Date Taking? Authorizing Provider  Acetaminophen-Codeine 300-30 MG tablet Take by mouth. 08/17/19   [provider]  albuterol (PROVENTIL HFA) 108 (90 Base) MCG/ACT inhaler INHALE TWO PUFFS BY MOUTH EVERY 4 HOURS AS NEEDED FOR WHEEZING FOR COUGH FOR SHORTNESS OF BREATH 05/26/17   Durward Parcel, DO  aspirin EC 81 MG EC tablet Take 1 tablet (81 mg total) by mouth daily. 05/24/15   Barrett, Joline Salt, PA-C  atorvastatin (LIPITOR) 40 MG tablet Take 1 tablet (40 mg total) by mouth daily at 6 PM. 08/01/21   Pricilla Riffle, MD  Black Cohosh 40 MG CAPS Take 1 capsule by mouth daily.      [provider]  Calcium Carb-Cholecalciferol (CALCIUM 600+D3 PO) Take 1 tablet by mouth daily.    [provider]  carvedilol (COREG) 25 MG tablet TAKE 1 TABLET (25 MG TOTAL) BY MOUTH 2 (TWO) TIMES DAILY WITH A MEAL. 01/22/21   Mullis, Kiersten P, DO  clopidogrel (PLAVIX) 75 MG tablet Take 1 tablet (75 mg total) by mouth daily. 08/01/21   Pricilla Riffle, MD  diltiazem (CARDIZEM CD) 180 MG 24 hr capsule TAKE 1 CAPSULE BY MOUTH EVERY DAY 01/31/21   Pricilla Riffle, MD  diltiazem (DILT-XR) 180 MG 24 hr capsule Take 1 capsule (180 mg total) by mouth daily. 07/12/20   Pricilla Riffle, MD  folic acid (FOLVITE) 1 MG tablet TAKE 1 TABLET (1 MG TOTAL) BY MOUTH DAILY. Patient taking differently: 800 mcg daily.  01/22/15   Karamalegos, Netta Neat, DO  hydrochlorothiazide (HYDRODIURIL) 25 MG tablet TAKE 1/2 TABLET BY MOUTH EVERY DAY 07/09/20   Mullis, Kiersten P, DO  Multiple Vitamin (MULTIVITAMIN WITH MINERALS) TABS tablet Take 1 tablet by mouth daily.    [provider]  nitroGLYCERIN (NITROSTAT) 0.4 MG SL tablet PLACE 1 TABLET UNDER THE TONGUE EVERY 5 MINUTES AS NEEDED FOR CHEST PAIN 08/01/21   Pricilla Riffle, MD  OLANZapine (ZYPREXA) 5 MG tablet TAKE 1 TABLET BY MOUTH EVERYDAY AT BEDTIME. PLEASE MAKE AN APPOINTMENT WITH FAMILY MEDICINE 07/08/21   Bess Kinds, MD  predniSONE (DELTASONE) 1 MG tablet TAKE 3  TABLETS BY MOUTH EVERY DAY 06/27/18   [provider]  sulfaSALAzine (AZULFIDINE) 500 MG tablet Take 2 tablets (1,000 mg total) by mouth 2 (two) times daily. 05/26/17   Durward Parcel, DO  valsartan (DIOVAN) 320 MG tablet Take 1 tablet (320 mg total) by mouth daily. 08/01/21   Pricilla Riffle, MD      Allergies    Ticagrelor, Cymbalta [duloxetine hcl], Lisinopril, Olanzapine, and Tomato    Review of Systems   Review of Systems  Neurological:  Positive for weakness.   Physical Exam Updated Vital Signs BP (!) 112/99    Pulse 75    Temp 97.7 F (36.5 C) (Oral)    Resp 20     SpO2 100%  Physical Exam Vitals and nursing note reviewed.  Constitutional:      General: She is not in acute distress. HENT:     Head: Normocephalic and atraumatic.  Eyes:     Conjunctiva/sclera: Conjunctivae normal.     Pupils: Pupils are equal, round, and reactive to light.  Cardiovascular:     Rate and Rhythm: Normal rate and regular rhythm.     Heart sounds: Normal heart sounds.  Pulmonary:     Effort: Pulmonary effort is normal. No respiratory distress.     Breath sounds: Normal breath sounds.  Abdominal:     General: There is no distension.     Tenderness: There is no guarding.  Musculoskeletal:        General: No deformity or signs of injury.     Cervical back: Neck supple.  Skin:    Findings: No lesion or rash.  Neurological:     Mental Status: She is alert. She is disoriented.     Comments: AAO x1, moving all 4 extremities spontaneously without weakness, 5 out of 5 strength.  No sensory deficit.  Cranial nerves appear intact.  Gait not assessed.    ED Results / Procedures / Treatments   Labs (all labs ordered are listed, but only abnormal results are displayed) Labs Reviewed  COMPREHENSIVE METABOLIC PANEL - Abnormal; Notable for the following components:      Result Value   Potassium 2.5 (*)    Chloride 97 (*)    Glucose, Bld 101 (*)    BUN 5 (*)    Creatinine, Ser 1.05 (*)    Calcium 8.4 (*)    GFR, Estimated 59 (*)    All other components within normal limits  CBC WITH DIFFERENTIAL/PLATELET - Abnormal; Notable for the following components:   Platelets 133 (*)    Lymphs Abs 0.6 (*)    All other components within normal limits  CBG MONITORING, ED - Abnormal; Notable for the following components:   Glucose-Capillary 101 (*)    All other components within normal limits  CBG MONITORING, ED - Abnormal; Notable for the following components:   Glucose-Capillary 156 (*)    All other components within normal limits  RESP PANEL BY RT-PCR (FLU A&B, COVID) [id number]   URINALYSIS, ROUTINE W REFLEX MICROSCOPIC  MAGNESIUM  AMMONIA  RAPID URINE DRUG SCREEN, HOSP PERFORMED  CBC  COMPREHENSIVE METABOLIC PANEL  CORTISOL-AM, BLOOD  HIV ANTIBODY (ROUTINE TESTING W REFLEX)  MAGNESIUM  I-STAT VENOUS BLOOD GAS, ED    EKG EKG Interpretation  Date/Time:  Friday August 01 2021 16:04:38 EST Ventricular Rate:  82 PR Interval:  116 QRS Duration: 68 QT Interval:  410 QTC Calculation: 479 R Axis:   68 Text Interpretation: Sinus rhythm with Premature atrial complexes  Otherwise normal ECG When compared with ECG of 05-Jul-2019 11:35, PREVIOUS ECG IS PRESENT Confirmed by Virgina Norfolk [phone]) on 08/01/2021 9:31:50 PM  Radiology CT HEAD WO CONTRAST ( )  Result Date: 08/01/2021 CLINICAL DATA:  Altered mental status. EXAM: CT HEAD WITHOUT CONTRAST TECHNIQUE: Contiguous axial images were obtained from the base of the skull through the vertex without intravenous contrast. COMPARISON:  March 28, 2019 FINDINGS: Brain: There is mild cerebral atrophy with widening of the extra-axial spaces and ventricular dilatation. There are areas of decreased attenuation within the white matter tracts of the supratentorial brain, consistent with microvascular disease changes. Vascular: No hyperdense vessel or unexpected calcification. Skull: Normal. Negative for fracture or focal lesion. Sinuses/Orbits: No acute finding. Other: None. IMPRESSION: 1. Generalized cerebral atrophy. 2. No acute intracranial abnormality. Electronically Signed   By: Aram Candela M.D.   On: 08/01/2021 23:26   DG Chest Port 1 View  Result Date: 08/02/2021 CLINICAL DATA:  Altered mental status. EXAM: PORTABLE CHEST 1 VIEW COMPARISON:  04/09/2020. FINDINGS: The heart is mildly enlarged. Atherosclerotic calcification of the aorta is noted. The mediastinal contour is stable. Stable scarring is noted at the left lung base. No consolidation, effusion, or pneumothorax. No acute osseous abnormality. IMPRESSION:  Cardiomegaly with no acute process. Electronically Signed   By: Thornell Sartorius M.D.   On: 08/02/2021 00:14    Procedures Procedures    Medications Ordered in ED Medications  potassium chloride 10 mEq in 100 mL IVPB (has no administration in time range)  magnesium sulfate IVPB 2 g 50 mL (2 g Intravenous New Bag/Given 08/02/21 0021)  enoxaparin (LOVENOX) injection 40 mg (has no administration in time range)  LORazepam (ATIVAN) tablet 0.5 mg (has no administration in time range)  LORazepam (ATIVAN) injection 0.5 mg (has no administration in time range)  LORazepam (ATIVAN) injection 1 mg (has no administration in time range)  albuterol (VENTOLIN HFA) 108 (90 Base) MCG/ACT inhaler 1 puff (has no administration in time range)  atorvastatin (LIPITOR) tablet 40 mg (has no administration in time range)  Calcium Carb-Cholecalciferol 600-10 MG-MCG TABS (has no administration in time range)  carvedilol (COREG) tablet 25 mg (has no administration in time range)  diltiazem (CARDIZEM CD) 24 hr capsule 180 mg (has no administration in time range)  folic acid (FOLVITE) tablet 1 mg (has no administration in time range)  hydrochlorothiazide (HYDRODIURIL) tablet 12.5 mg (has no administration in time range)  OLANZapine (ZYPREXA) tablet 5 mg (has no administration in time range)  predniSONE (DELTASONE) tablet 3 mg (has no administration in time range)  sulfaSALAzine (AZULFIDINE) tablet 1,000 mg (has no administration in time range)  irbesartan (AVAPRO) tablet 300 mg (has no administration in time range)  potassium chloride SA (KLOR-CON M) CR tablet 40 mEq (40 mEq Oral Given 08/01/21 2334)  magnesium oxide (MAG-OX) tablet 400 mg (400 mg Oral Given 08/01/21 2334)    ED Course/ Medical Decision Making/ A&P                           Medical Decision Making  66 year old female with a history of HTN, scleroderma, CAD, HLD, rheumatoid arthritis who presents to the emergency department with altered mental status.  The  patient was seen in her family medicine clinic and referred to the emergency department for further evaluation.  She was seen on 07/31/2021 for a progressive decline in mental status and function over the last few months.  Fellow members called yesterday and stated  that symptoms have acutely worsened and she was recommended to present to the emergency department.  At baseline she walks with a cane and has some balance difficulties but his no longer able to walk given this acute change over the past few days.  She is also having episodes of urinary incontinence.  CBC, folate, B12, TSH and RPR were all checked outpatient.  Patient was referred to the emergency department for further work-up given this acute change.  On my evaluation, there were no family members bedside to provide further history and the patient was altered and unable to contribute.  On arrival, the patient was afebrile, hemodynamically stable, Not tachycardic or tachypneic.  She presents with disorientation, AAO x1, no clear cranial nerve deficit or areas of focal weakness or numbness.  She does present with new incontinence.  No clear back pain.  Increasing difficulty with ambulation.  Differential diagnosis includes worsening dementia, normal pressure hydrocephalus, less likely CVA, less likely epidural abscess, osteomyelitis/discitis, cauda equina syndrome.  Additional differential of the patient's mental status changes very broad and includes toxic metabolic derangement such as hyper/hypothyroidism, lecture light abnormality, hypoglycemia, hyperammonemia, uremia/renal failure, endocrine crisis, infectious etiology.  Patient screening laboratory work-up initiated significant for blood glucose of 156, CMP with hypokalemia to 2.5, magnesium collected and pending.  BUN normal, no evidence of renal or liver dysfunction, CBC without a leukocytosis, anemia or platelet abnormality.  Urinalysis and UDS pending, VBG, COVID-19 and influenza PCR testing  pending, ammonia collected and in process.  The patient was administered electrolyte replenishment in the form of magnesium and potassium orally and IV.  A CT head was performed which revealed global cerebral atrophy and dilation of the patient's ventricles ex vacuo.  My primary concern is potentially of normal pressure hydrocephalus.  Given the patient's acute change in mental status, change in gait, new incontinence, will evaluate further with MRI brain and MRI of the lumbar spine to evaluate for stroke or spinal cord compression and rule this out.  Chest x-ray was unremarkable.  An EKG did not reveal significant cardiac conduction abnormalities with no ischemic changes.  Family practice management was consulted for admission given the patient's multiple electrolyte abnormalities, change in mental status and inability to ambulate.  We will plan for admission for observation, MRI imaging pending at time of admission.  I did speak with neurology on-call who agreed with this plan of care, did not recommend any further work-up. Further work-up will be conducted inpatient.  Patient subsequently admitted in stable condition.  Patient MRI of the lumbar spine revealed no evidence of cauda equina to explain the patient's recent episodes of incontinence. Her MRI Brain revealed no evidence of acute CVA. 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    Final Clinical Impression(s) / ED Diagnoses Final diagnoses:  Disorientation    Rx / DC Orders ED Discharge Orders     None         Ernie Avena, MD 08/02/21 1637

## 2021-08-01 NOTE — Telephone Encounter (Signed)
Pt's medications were sent to pt's pharmacy as requested. Confirmation received.  

## 2021-08-01 NOTE — ED Triage Notes (Signed)
EMS stated, pt lives with her daughter , pt lost her husband and she is not eating or drinking just with her medication. This started about 2 weeks ago . Pt shufles when she walks.

## 2021-08-01 NOTE — Progress Notes (Signed)
Unable to find pt in waiting room or in ED room 18. If or when pt has been located please call MRI.

## 2021-08-01 NOTE — ED Provider Triage Note (Signed)
Emergency Medicine Provider Triage Evaluation Note  Jocelyn Shelton , a 66 y.o. female  was evaluated in triage.  Per EMS report patient lives with daughter and has not been eating or drinking.  Apparently her husband died recently and she has stopped eating and drinking.  She is only taking food or water with medication.  This is been ongoing for about 2 weeks.  There is a note from yesterday from a family medicine visit that states that patient has had change in functional and mental status over the past few months.  States that she has lost 20 pounds in the past few months.  Review of Systems  Positive: See above Negative:   Physical Exam  BP (!) 142/79 (BP Location: Left Arm)    Pulse 60    Temp 99.6 F (37.6 C) (Oral)    Resp 16    SpO2 99%  Gen:   Awake, no distress, minimally responsive to questions.  She does know her name.  She states she is at [employer].  She does not answer any other questions for me. Resp:  Normal effort  MSK:   Moves extremities without difficulty  Other:  Abdomen is soft and nontender to palpation.  S1/S2 without murmur.  Presents from crackles scattered. Medical Decision Making  Medically screening exam initiated at 4:08 PM.  Appropriate orders placed.  Jocelyn Shelton was informed that the remainder of the evaluation will be completed by another provider, this initial triage assessment does not replace that evaluation, and the importance of remaining in the ED until their evaluation is complete.     Cristopher Peru, PA-C 08/01/21 1611

## 2021-08-01 NOTE — ED Notes (Signed)
Patient transported to CT 

## 2021-08-01 NOTE — Telephone Encounter (Signed)
°*  STAT* If patient is at the pharmacy, call can be transferred to refill team.   1. Which medications need to be refilled? (please list name of each medication and dose if known) clopidogrel (PLAVIX) 75 MG tablet atorvastatin (LIPITOR) 40 MG tablet nitroGLYCERIN (NITROSTAT) 0.4 MG SL tablet valsartan (DIOVAN) 320 MG tablet  2. Which pharmacy/location (including street and city if local pharmacy) is medication to be sent to? [id number] - [place], [place] - [address]  3. Do they need a 30 day or 90 day supply? 90 day supply   PT HAS AN UPCOMING APPT 2023 WITH DR. Tenny Craw

## 2021-08-02 ENCOUNTER — Observation Stay (HOSPITAL_COMMUNITY): Payer: Medicare Other

## 2021-08-02 ENCOUNTER — Emergency Department (HOSPITAL_COMMUNITY): Payer: Medicare Other

## 2021-08-02 DIAGNOSIS — E876 Hypokalemia: Secondary | ICD-10-CM | POA: Diagnosis not present

## 2021-08-02 DIAGNOSIS — I130 Hypertensive heart and chronic kidney disease with heart failure and stage 1 through stage 4 chronic kidney disease, or unspecified chronic kidney disease: Secondary | ICD-10-CM | POA: Diagnosis present

## 2021-08-02 DIAGNOSIS — F03B0 Unspecified dementia, moderate, without behavioral disturbance, psychotic disturbance, mood disturbance, and anxiety: Secondary | ICD-10-CM

## 2021-08-02 DIAGNOSIS — K219 Gastro-esophageal reflux disease without esophagitis: Secondary | ICD-10-CM | POA: Diagnosis not present

## 2021-08-02 DIAGNOSIS — F0390 Unspecified dementia without behavioral disturbance: Secondary | ICD-10-CM | POA: Diagnosis not present

## 2021-08-02 DIAGNOSIS — Z8673 Personal history of transient ischemic attack (TIA), and cerebral infarction without residual deficits: Secondary | ICD-10-CM | POA: Diagnosis not present

## 2021-08-02 DIAGNOSIS — M349 Systemic sclerosis, unspecified: Secondary | ICD-10-CM | POA: Diagnosis present

## 2021-08-02 DIAGNOSIS — E860 Dehydration: Secondary | ICD-10-CM | POA: Diagnosis not present

## 2021-08-02 DIAGNOSIS — N179 Acute kidney failure, unspecified: Secondary | ICD-10-CM | POA: Diagnosis present

## 2021-08-02 DIAGNOSIS — Z8249 Family history of ischemic heart disease and other diseases of the circulatory system: Secondary | ICD-10-CM | POA: Diagnosis not present

## 2021-08-02 DIAGNOSIS — R4182 Altered mental status, unspecified: Secondary | ICD-10-CM | POA: Diagnosis present

## 2021-08-02 DIAGNOSIS — Z515 Encounter for palliative care: Secondary | ICD-10-CM | POA: Diagnosis not present

## 2021-08-02 DIAGNOSIS — D849 Immunodeficiency, unspecified: Secondary | ICD-10-CM | POA: Diagnosis present

## 2021-08-02 DIAGNOSIS — J432 Centrilobular emphysema: Secondary | ICD-10-CM | POA: Diagnosis present

## 2021-08-02 DIAGNOSIS — I5032 Chronic diastolic (congestive) heart failure: Secondary | ICD-10-CM | POA: Diagnosis present

## 2021-08-02 DIAGNOSIS — N1831 Chronic kidney disease, stage 3a: Secondary | ICD-10-CM | POA: Diagnosis present

## 2021-08-02 DIAGNOSIS — R410 Disorientation, unspecified: Secondary | ICD-10-CM | POA: Diagnosis present

## 2021-08-02 DIAGNOSIS — E7800 Pure hypercholesterolemia, unspecified: Secondary | ICD-10-CM | POA: Diagnosis not present

## 2021-08-02 DIAGNOSIS — G9340 Encephalopathy, unspecified: Secondary | ICD-10-CM | POA: Diagnosis not present

## 2021-08-02 DIAGNOSIS — I272 Pulmonary hypertension, unspecified: Secondary | ICD-10-CM | POA: Diagnosis present

## 2021-08-02 DIAGNOSIS — Z66 Do not resuscitate: Secondary | ICD-10-CM | POA: Diagnosis not present

## 2021-08-02 DIAGNOSIS — F329 Major depressive disorder, single episode, unspecified: Secondary | ICD-10-CM | POA: Diagnosis present

## 2021-08-02 DIAGNOSIS — M069 Rheumatoid arthritis, unspecified: Secondary | ICD-10-CM | POA: Diagnosis present

## 2021-08-02 DIAGNOSIS — F05 Delirium due to known physiological condition: Secondary | ICD-10-CM | POA: Diagnosis present

## 2021-08-02 DIAGNOSIS — E871 Hypo-osmolality and hyponatremia: Secondary | ICD-10-CM | POA: Diagnosis present

## 2021-08-02 DIAGNOSIS — Z20822 Contact with and (suspected) exposure to covid-19: Secondary | ICD-10-CM | POA: Diagnosis present

## 2021-08-02 DIAGNOSIS — Z79899 Other long term (current) drug therapy: Secondary | ICD-10-CM | POA: Diagnosis not present

## 2021-08-02 DIAGNOSIS — I2510 Atherosclerotic heart disease of native coronary artery without angina pectoris: Secondary | ICD-10-CM | POA: Diagnosis not present

## 2021-08-02 DIAGNOSIS — R54 Age-related physical debility: Secondary | ICD-10-CM | POA: Diagnosis not present

## 2021-08-02 DIAGNOSIS — Z7189 Other specified counseling: Secondary | ICD-10-CM | POA: Diagnosis not present

## 2021-08-02 DIAGNOSIS — F03B3 Unspecified dementia, moderate, with mood disturbance: Secondary | ICD-10-CM | POA: Diagnosis present

## 2021-08-02 LAB — RAPID URINE DRUG SCREEN, HOSP PERFORMED
Amphetamines: NOT DETECTED
Barbiturates: NOT DETECTED
Benzodiazepines: NOT DETECTED
Cocaine: NOT DETECTED
Opiates: POSITIVE — AB
Tetrahydrocannabinol: POSITIVE — AB

## 2021-08-02 LAB — URINALYSIS, ROUTINE W REFLEX MICROSCOPIC
Glucose, UA: NEGATIVE mg/dL
Ketones, ur: NEGATIVE mg/dL
Nitrite: NEGATIVE
Protein, ur: NEGATIVE mg/dL
Specific Gravity, Urine: 1.015 (ref 1.005–1.030)
pH: 7 (ref 5.0–8.0)

## 2021-08-02 LAB — I-STAT VENOUS BLOOD GAS, ED
Acid-Base Excess: 7 mmol/L — ABNORMAL HIGH (ref 0.0–2.0)
Bicarbonate: 34.5 mmol/L — ABNORMAL HIGH (ref 20.0–28.0)
Calcium, Ion: 1.03 mmol/L — ABNORMAL LOW (ref 1.15–1.40)
HCT: 42 % (ref 36.0–46.0)
Hemoglobin: 14.3 g/dL (ref 12.0–15.0)
O2 Saturation: 52 %
Potassium: 2.9 mmol/L — ABNORMAL LOW (ref 3.5–5.1)
Sodium: 138 mmol/L (ref 135–145)
TCO2: 36 mmol/L — ABNORMAL HIGH (ref 22–32)
pCO2, Ven: 60.5 mmHg — ABNORMAL HIGH (ref 44.0–60.0)
pH, Ven: 7.364 (ref 7.250–7.430)
pO2, Ven: 30 mmHg — CL (ref 32.0–45.0)

## 2021-08-02 LAB — BASIC METABOLIC PANEL
Anion gap: 21 — ABNORMAL HIGH (ref 5–15)
BUN: 11 mg/dL (ref 8–23)
CO2: 21 mmol/L — ABNORMAL LOW (ref 22–32)
Calcium: 8.2 mg/dL — ABNORMAL LOW (ref 8.9–10.3)
Chloride: 100 mmol/L (ref 98–111)
Creatinine, Ser: 1.36 mg/dL — ABNORMAL HIGH (ref 0.44–1.00)
GFR, Estimated: 43 mL/min — ABNORMAL LOW (ref >60)
Glucose, Bld: 141 mg/dL — ABNORMAL HIGH (ref 70–99)
Potassium: 3.8 mmol/L (ref 3.5–5.1)
Sodium: 142 mmol/L (ref 135–145)

## 2021-08-02 LAB — COMPREHENSIVE METABOLIC PANEL
ALT: 13 U/L (ref 0–44)
AST: 22 U/L (ref 15–41)
Albumin: 3.6 g/dL (ref 3.5–5.0)
Alkaline Phosphatase: 107 U/L (ref 38–126)
Anion gap: 11 (ref 5–15)
BUN: 9 mg/dL (ref 8–23)
CO2: 32 mmol/L (ref 22–32)
Calcium: 8.4 mg/dL — ABNORMAL LOW (ref 8.9–10.3)
Chloride: 95 mmol/L — ABNORMAL LOW (ref 98–111)
Creatinine, Ser: 1.31 mg/dL — ABNORMAL HIGH (ref 0.44–1.00)
GFR, Estimated: 45 mL/min — ABNORMAL LOW (ref >60)
Glucose, Bld: 143 mg/dL — ABNORMAL HIGH (ref 70–99)
Potassium: 3.2 mmol/L — ABNORMAL LOW (ref 3.5–5.1)
Sodium: 138 mmol/L (ref 135–145)
Total Bilirubin: 0.9 mg/dL (ref 0.3–1.2)
Total Protein: 7.4 g/dL (ref 6.5–8.1)

## 2021-08-02 LAB — CORTISOL-AM, BLOOD: Cortisol - AM: 27.1 ug/dL — ABNORMAL HIGH (ref 6.7–22.6)

## 2021-08-02 LAB — CBC
HCT: 41 % (ref 36.0–46.0)
Hemoglobin: 13.4 g/dL (ref 12.0–15.0)
MCH: 30.2 pg (ref 26.0–34.0)
MCHC: 32.7 g/dL (ref 30.0–36.0)
MCV: 92.3 fL (ref 80.0–100.0)
Platelets: 148 10*3/uL — ABNORMAL LOW (ref 150–400)
RBC: 4.44 MIL/uL (ref 3.87–5.11)
RDW: 12.5 % (ref 11.5–15.5)
WBC: 7.8 10*3/uL (ref 4.0–10.5)
nRBC: 0 % (ref 0.0–0.2)

## 2021-08-02 LAB — MAGNESIUM
Magnesium: 1.7 mg/dL (ref 1.7–2.4)
Magnesium: 3 mg/dL — ABNORMAL HIGH (ref 1.7–2.4)

## 2021-08-02 LAB — RESP PANEL BY RT-PCR (FLU A&B, COVID) ARPGX2
Influenza A by PCR: NEGATIVE
Influenza B by PCR: NEGATIVE
SARS Coronavirus 2 by RT PCR: NEGATIVE

## 2021-08-02 LAB — AMMONIA: Ammonia: 63 umol/L — ABNORMAL HIGH (ref 9–35)

## 2021-08-02 LAB — HIV ANTIBODY (ROUTINE TESTING W REFLEX): HIV Screen 4th Generation wRfx: NONREACTIVE

## 2021-08-02 LAB — URINALYSIS, MICROSCOPIC (REFLEX)

## 2021-08-02 LAB — CBG MONITORING, ED: Glucose-Capillary: 156 mg/dL — ABNORMAL HIGH (ref 70–99)

## 2021-08-02 MED ORDER — OYSTER SHELL CALCIUM/D3 500-5 MG-MCG PO TABS
1.0000 | ORAL_TABLET | Freq: Every day | ORAL | Status: DC
  Administered 2021-08-02 – 2021-08-11 (×10): 1 via ORAL
  Filled 2021-08-02 (×10): qty 1

## 2021-08-02 MED ORDER — POTASSIUM CHLORIDE 10 MEQ/100ML IV SOLN
INTRAVENOUS | Status: AC
  Administered 2021-08-02: 10 meq via INTRAVENOUS
  Filled 2021-08-02: qty 100

## 2021-08-02 MED ORDER — CLOPIDOGREL BISULFATE 75 MG PO TABS
75.0000 mg | ORAL_TABLET | Freq: Every day | ORAL | Status: DC
  Administered 2021-08-02: 75 mg via ORAL
  Filled 2021-08-02: qty 1

## 2021-08-02 MED ORDER — ATORVASTATIN CALCIUM 40 MG PO TABS
40.0000 mg | ORAL_TABLET | Freq: Every day | ORAL | Status: DC
  Administered 2021-08-02 – 2021-08-10 (×9): 40 mg via ORAL
  Filled 2021-08-02 (×8): qty 1

## 2021-08-02 MED ORDER — OLANZAPINE 5 MG PO TABS: 5.0000 mg | ORAL_TABLET | Freq: Every day | ORAL | Status: DC

## 2021-08-02 MED ORDER — ALBUTEROL SULFATE (2.5 MG/3ML) 0.083% IN NEBU: 2.5000 mg | INHALATION_SOLUTION | RESPIRATORY_TRACT | Status: DC | PRN

## 2021-08-02 MED ORDER — HYDROCHLOROTHIAZIDE 12.5 MG PO TABS
12.5000 mg | ORAL_TABLET | Freq: Every day | ORAL | Status: DC
  Administered 2021-08-02 – 2021-08-11 (×10): 12.5 mg via ORAL
  Filled 2021-08-02 (×10): qty 1

## 2021-08-02 MED ORDER — PREDNISONE 1 MG PO TABS
3.0000 mg | ORAL_TABLET | Freq: Every day | ORAL | Status: DC
  Administered 2021-08-02 – 2021-08-11 (×10): 3 mg via ORAL
  Filled 2021-08-02 (×10): qty 3

## 2021-08-02 MED ORDER — LORAZEPAM 2 MG/ML IJ SOLN: 0.5000 mg | INTRAMUSCULAR | Status: DC | PRN

## 2021-08-02 MED ORDER — LORAZEPAM 0.5 MG PO TABS: 0.5000 mg | ORAL_TABLET | ORAL | Status: DC | PRN

## 2021-08-02 MED ORDER — ASPIRIN EC 81 MG PO TBEC
81.0000 mg | DELAYED_RELEASE_TABLET | Freq: Every day | ORAL | Status: DC
  Administered 2021-08-02 – 2021-08-11 (×10): 81 mg via ORAL
  Filled 2021-08-02 (×10): qty 1

## 2021-08-02 MED ORDER — FOLIC ACID 1 MG PO TABS
1.0000 mg | ORAL_TABLET | Freq: Every day | ORAL | Status: DC
  Administered 2021-08-02 – 2021-08-11 (×10): 1 mg via ORAL
  Filled 2021-08-02 (×10): qty 1

## 2021-08-02 MED ORDER — ENOXAPARIN SODIUM 40 MG/0.4ML IJ SOSY
40.0000 mg | PREFILLED_SYRINGE | INTRAMUSCULAR | Status: DC
  Administered 2021-08-02 – 2021-08-11 (×10): 40 mg via SUBCUTANEOUS
  Filled 2021-08-02 (×10): qty 0.4

## 2021-08-02 MED ORDER — CARVEDILOL 12.5 MG PO TABS
25.0000 mg | ORAL_TABLET | Freq: Two times a day (BID) | ORAL | Status: DC
  Administered 2021-08-02 – 2021-08-11 (×20): 25 mg via ORAL
  Filled 2021-08-02 (×19): qty 2

## 2021-08-02 MED ORDER — IRBESARTAN 300 MG PO TABS
300.0000 mg | ORAL_TABLET | Freq: Every day | ORAL | Status: DC
  Administered 2021-08-02 – 2021-08-05 (×4): 300 mg via ORAL
  Filled 2021-08-02 (×4): qty 1

## 2021-08-02 MED ORDER — SODIUM CHLORIDE 0.9 % IV SOLN: INTRAVENOUS | Status: AC

## 2021-08-02 MED ORDER — SULFASALAZINE 500 MG PO TABS
1000.0000 mg | ORAL_TABLET | Freq: Two times a day (BID) | ORAL | Status: DC
  Administered 2021-08-02 – 2021-08-11 (×21): 1000 mg via ORAL
  Filled 2021-08-02 (×23): qty 2

## 2021-08-02 MED ORDER — DILTIAZEM HCL ER COATED BEADS 180 MG PO CP24
180.0000 mg | ORAL_CAPSULE | Freq: Every day | ORAL | Status: DC
  Administered 2021-08-02 – 2021-08-11 (×10): 180 mg via ORAL
  Filled 2021-08-02 (×10): qty 1

## 2021-08-02 MED ORDER — LORAZEPAM 2 MG/ML IJ SOLN: 1.0000 mg | INTRAMUSCULAR | Status: DC | PRN

## 2021-08-02 NOTE — Hospital Course (Addendum)
Jocelyn Shelton is a 66 y.o. female presenting with worsening confusion and electrolyte derrangement. PMH is significant for COPD/emphysema, hypertension, hyperlipidemia, CAD, GERD, MDD.  Worsening confusion   Hx of dementia Patient presented with worsening confusion, difficulty with ambulation, and incontinence. Head CT with cerebral atrophy and ventricular dilation. Given clinical picture, there was suspicion for normal pressure hydrocephaly. MRI of brain showing no intracranial abnormality.  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  Partially visible cervical spine degeneration with up to mild spinal stenosis. After speaking with patient's daughter, it was determined that her dementia is worsening and this may represent her new baseline. Home zyprexa was held and patient was monitored. Patient was evaluated by PT/OT and determined to need SNF with possible transition to ALF/memory care unit. Palliative care was consulted and after discussion with family made patient DNR/DNI. Palliative will follow her outpatient.  Hypokalemia Patient with significant hypokalemia during admission that was repleted with potassium and had subsequent improvement. Patient K was stable by discharge.  AKI Patient's Cr began to rise, home losartan was held. Cr was stable by discharge and resolved.   Hyponatremia Hyponatremia to 121 on day of discharge. Most likely secondary to excess free water intake, low sodium diet while she was inpatient and HCTZ. Patient had these changed inpatient (fluid restriction, HCTZ stopped, regular sodium diet) and remained asymptomatic and felt stable for discharge.  All other chronic conditions were stable and managed with home medications as appropriate.

## 2021-08-02 NOTE — ED Notes (Signed)
Pt's BP elevated. Checked in both arms.  Provider notified

## 2021-08-02 NOTE — H&P (Addendum)
[facility] Admission History and Physical Service Pager: [phone]  Patient name: Jocelyn Shelton Medical record number: [id number] Date of birth: 04/18/1956 Age: 66 y.o. Gender: female  Primary Care Provider: Holley Bouche, MD Consultants: None Code Status: Full code which was confirmed with her daughter April Coppedge Preferred Emergency Contact: April Gonzales - daughter  Contact Information     Name Relation Home Work De Lamere, April Daughter [phone]  [phone]   Clearence Ped   [phone]       Chief Complaint: Worsening confusion  Assessment and Plan: Jocelyn Shelton is a 66 y.o. female presenting with worsening confusion and electrolyte derrangement. PMH is significant for COPD/emphysema, hypertension, hyperlipidemia, CAD, GERD, MDD.  Worsening confusion with a history of dementia Patient presented with worsening confusion with 1 day history of difficulty ambulating. Per daughter's report patient has had worsening confusion and memory loss in the the last 3-4 months and became more pronounced in the last 2 days with incontinence and difficulty with ambulation. On exam patient is only oriented to person, pupils are reactive to light, sensation and muscle strength are intact on all extremities.  Most recent labs, completed at her PCP the day before her admission shows normal folate, vitamin B12 and TSH levels. Her CMP showed K 2.9, Ca 8.4, BUN 5. Of note sodium was 145 2 days ago and on admission a day later had a drastic drop to 135 suspicious for possible cause of acute change in mobility.  Head CT show cerebral atrophy and ventricular dilation. Given her Head CT finding  with presentation of confusion/ memory loss and incontinence there is strong suspicion for normal pressure hydrocephaly. Vascular dementia is also considered although less likely with no history of stroke or focal neurologic deficit on exam, will follow up with brain  MRI. ABG is pending which will assess for possible hypercapnia considering patient history of COPD. Other differentials although less likely can include Alzheimer's, and Parkinson's disease.  Of note patient recently received her COVID booster shot a day before the decline in status, considering systemic reaction from COVID vaccine in the differential. Patient's daughter April states that she has been taking prednisone 3 mg daily for some time due to scleroderma but the daughter thinks there is a chance she may have suddenly stopped this as she was gaining some weight with that and had complained of that. Plan to hold prednisone for morning cortisol level. Will consider consulting neurology for recommendation and additional assessment. -Admit to progressive, attending Dr. Andria Frames -Every 4 hours neurochecks -Follow-up MRI brain -Consider neuro consult pending MRI results -PT/OT evaluate and treat -Delirium precautions -AM cortisol level -PT/OT evaluate and treat -SLP -Consider consulting neurology pending MRI findings  Hypokalemia Potassium of 2.5.  ED ordered replacement with 22meq oral x1 and 5 runs of 60meq IV potassium. -Replacement as above -AM magnesium -AM BMP  Hypocalcemia Calcium level of 8.4, albumin of 3.7 with corrected calcium of 8.6. On home medication of calcium 600+ D3 daily -Continue home medication.  COPD/emphysema No wheezing on physical exam.  Home medications include albuterol as needed. -Continue home meds  Hypertension Blood pressure range of 100-142 over 70s-90s since admitted. Home medications include Coreg 25 mg twice daily, valsartan 320 mg daily and HCTZ 12.5 mg daily as well as diltiazem 180 mg daily. -Continue home meds   CAD Has a history of an MI in 2016.  Home medications include aspirin 81 mg, Plavix 75 mg, Lipitor 40 mg.  Patient also takes nitroglycerin as needed for chest pain. -Continue home meds   HFpEF Last echo 06/17/2018 with 55-60% ejection  fraction and grade 2 diastolic dysfunction.  She was also noted to have mildly increased systolic pressure in the pulmonary arteries at that time.  Physical exam was negative JVD, no cracks on auscultation or BLE edema.  Scleroderma   Raynaud's syndrome   rheumatoid arthritis Noted per chart review.  Patient's daughter confirms that the patient takes 3 mg prednisone daily.  The daughter is unsure if the patient may have suddenly stopped taking this medication because patient had complained of some weight gain while on the prednisone. -AM cortisol level as above -We will restart patient's home prednisone dosing as soon as the cortisol levels drawn  GERD No home medications documented. -Monitor for frequent GERD symptoms, can consider starting a PPI if there is significant  MDD Home meds include Zyprexa 5 mg daily.  EKG shows QTC of 479. -We will hold Zyprexa at this time  Hyperlipidemia Home medications include atorvastatin 40 mg daily -Continue home atorvastatin   FEN/GI: N.p.o. Prophylaxis: Lovenox  Disposition: Progressive  History of Present Illness:  Jocelyn Shelton is a 66 y.o. female presenting with confusion with a baseline history of dementia.  Patient's daughter, April was called after admitting the patient to get additional history as the patient was unable to provide it.  She states that this has been going on for several months and has been progressing and that she had noticed the patient had loss of urinary continence yesterday and that was unusual.  She states that on the morning of 1/6 the patient was unable to get out of bed and seemed very confused.  She had other bouts of urinary incontinence during that day and so she reached out to her primary office who recommended taking her to the emergency department.  She denies any medication changes of late.  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   Abnormality of heart beat 07/07/2019   Centrilobular emphysema ([place]) 07/29/2017   ILD (interstitial lung disease) ([place]) 07/29/2017   Pulmonary hypertension ([place]), Possible 06/17/2017   Coronary artery disease involving native coronary artery of native heart without angina pectoris    Scleroderma ([place]) 03/08/2016   Immunosuppressed status ([place])    COPD (chronic obstructive pulmonary disease) ([place]) 08/30/2015   Rheumatoid arthritis ([place]) 03/14/2013   GERD (gastroesophageal reflux disease) 08/02/2008   Major depressive disorder, recurrent episode ([place]) 09/23/2006   Tobacco use disorder 09/23/2006   Primary hypertension 09/23/2006    Past Medical History: Past Medical History:  Diagnosis Date   Avascular necrosis of humeral head, left ([place]) 06/17/2017   Avascular necrosis of humeral head, right ([place]) 06/17/2017   Bronchiectasis ([place]) 06/17/2017   CAD in native artery 05/23/2015   High cholesterol    Hypertension    Leg pain    ABIs 1/18: R 1.2, L 1.1   Myocardial infarction ([place]) 04/2015   Raynauds syndrome 09/23/2006   Qualifier: Diagnosis of  By: Moreno-Coll  MD, Adlih     Reflux    Rheumatoid arthritis ([place])    "all over" (06/16/2017)   Scleroderma ([place])  Past Surgical History: Past Surgical History:  Procedure Laterality Date   APPENDECTOMY  90's   CARDIAC CATHETERIZATION N/A 05/22/2015   Procedure: Left Heart Cath and Coronary Angiography;  Surgeon: Troy Sine, MD;  Location: [facility];  Service: Cardiovascular;  Laterality: N/A;   CARDIAC CATHETERIZATION N/A 05/22/2015   Procedure: Coronary Stent Intervention;  Surgeon: Troy Sine, MD;  Location: [facility];  Service: Cardiovascular;  Laterality: N/A;    Social  History: Social History   Tobacco Use   Smoking status: Every Day    Packs/day: 0.50    Years: 46.00    Pack years: 23.00    Types: Cigarettes   Smokeless tobacco: Never  Vaping Use   Vaping Use: Never used  Substance Use Topics   Alcohol use: Yes    Alcohol/week: 4.0 standard drinks    Types: 4 Standard drinks or equivalent per week   Drug use: No   Additional social history:   Please also refer to relevant sections of EMR.  Family History: Family History  Problem Relation Age of Onset   Hypertension Father    Heart attack Mother    Diabetes Daughter    Diabetes Sister    Lung cancer Brother 96    Allergies and Medications: Allergies  Allergen Reactions   Ticagrelor Shortness Of Breath   Cymbalta [Duloxetine Hcl] Diarrhea and Nausea Only    No appetite, stomach pain   Lisinopril Cough   Olanzapine Other (See Comments)    Interfered with Blood pressure medications Interfered with Blood pressure medications   Tomato Rash   No current facility-administered medications on file prior to encounter.   Current Outpatient Medications on File Prior to Encounter  Medication Sig Dispense Refill   Acetaminophen-Codeine 300-30 MG tablet Take by mouth.     albuterol (PROVENTIL HFA) 108 (90 Base) MCG/ACT inhaler INHALE TWO PUFFS BY MOUTH EVERY 4 HOURS AS NEEDED FOR WHEEZING FOR COUGH FOR SHORTNESS OF BREATH 7 each 1   aspirin EC 81 MG EC tablet Take 1 tablet (81 mg total) by mouth daily.     atorvastatin (LIPITOR) 40 MG tablet Take 1 tablet (40 mg total) by mouth daily at 6 PM. 90 tablet 1   Black Cohosh 40 MG CAPS Take 1 capsule by mouth daily.      Calcium Carb-Cholecalciferol (CALCIUM 600+D3 PO) Take 1 tablet by mouth daily.     carvedilol (COREG) 25 MG tablet TAKE 1 TABLET (25 MG TOTAL) BY MOUTH 2 (TWO) TIMES DAILY WITH A MEAL. 180 tablet 1   clopidogrel (PLAVIX) 75 MG tablet Take 1 tablet (75 mg total) by mouth daily. 90 tablet 1   diltiazem (CARDIZEM CD) 180 MG 24 hr  capsule TAKE 1 CAPSULE BY MOUTH EVERY DAY 90 capsule 3   diltiazem (DILT-XR) 180 MG 24 hr capsule Take 1 capsule (180 mg total) by mouth daily. 90 capsule 3   folic acid (FOLVITE) 1 MG tablet TAKE 1 TABLET (1 MG TOTAL) BY MOUTH DAILY. (Patient taking differently: 800 mcg daily. ) 30 tablet 6   hydrochlorothiazide (HYDRODIURIL) 25 MG tablet TAKE 1/2 TABLET BY MOUTH EVERY DAY 45 tablet 3   Multiple Vitamin (MULTIVITAMIN WITH MINERALS) TABS tablet Take 1 tablet by mouth daily.     nitroGLYCERIN (NITROSTAT) 0.4 MG SL tablet PLACE 1 TABLET UNDER THE TONGUE EVERY 5 MINUTES AS NEEDED FOR CHEST PAIN 75 tablet 0   OLANZapine (ZYPREXA) 5 MG tablet TAKE 1 TABLET BY MOUTH EVERYDAY  AT BEDTIME. PLEASE MAKE AN APPOINTMENT WITH FAMILY MEDICINE 30 tablet 0   predniSONE (DELTASONE) 1 MG tablet TAKE 3 TABLETS BY MOUTH EVERY DAY     sulfaSALAzine (AZULFIDINE) 500 MG tablet Take 2 tablets (1,000 mg total) by mouth 2 (two) times daily. 120 tablet 0   valsartan (DIOVAN) 320 MG tablet Take 1 tablet (320 mg total) by mouth daily. 90 tablet 1    Objective: BP (!) 112/99    Pulse 75    Temp 97.7 F (36.5 C) (Oral)    Resp 20    SpO2 100%  Exam: General:Awake, well appearing, NAD HEENT: Atraumatic, MMM, No sclera icterus CV: RRR, no murmurs, normal S1/S2 Pulm: CTAB, good WOB on RA, no crackles or wheezing Abd: Soft, no distension, no tenderness Skin: dry, warm Ext: No BLE edema, +2 Pedal and radial pulse.  Neuro: oriented x1 (person), no sign of focal neurologic deficit Psych: Flat affect, intermittently somlent   Labs and Imaging: CBC BMET  Recent Labs  Lab 08/01/21 1620  WBC 4.6  HGB 13.4  HCT 39.4  PLT 133*   Recent Labs  Lab 08/01/21 1620  NA 135  K 2.5*  CL 97*  CO2 28  BUN 5*  CREATININE 1.05*  GLUCOSE 101*  CALCIUM 8.4*     EKG: Sinus rhythm with PACs and no ST changes   Alen Bleacher, MD 08/02/2021, 2:27 AM PGY-1, [facility] Intern pager: [phone], text pages  welcome   Upper Level Addendum:  I have seen and evaluated this patient along with Dr. Adah Salvage and reviewed the above note, making necessary revisions as appropriate.  I agree with the medical decision making and physical exam as noted above.  Lurline Del, DO PGY-3 [facility] Residency

## 2021-08-02 NOTE — ED Notes (Signed)
PT/OT eval in progress °

## 2021-08-02 NOTE — ED Notes (Signed)
PT/OT at bedside.

## 2021-08-02 NOTE — Progress Notes (Signed)
FPTS Brief Progress Note  S:Patient was resting comfortably in her room, did not wake her.    O: BP (!) 146/107 (BP Location: Left Arm)    Pulse 67    Temp 98.1 F (36.7 C) (Oral)    Resp 20    Wt 68.8 kg    SpO2 99%    BMI 24.48 kg/m   General: resting comfortably, sleeping in bed CV: sinus rhythm on telemetry Respiratory: breathing comfortably on room air, satting 100% on tele  A/P: Worsening confusion   Hx of dementia - Orders reviewed. Labs for AM ordered, which was adjusted as needed.  - No changes to the plan at this time.   Evelena Leyden, DO 08/03/2021, 1:33 AM PGY-2, [facility] Night Resident  Please page [phone] with questions.

## 2021-08-02 NOTE — Evaluation (Signed)
Physical Therapy Evaluation Patient Details Name: Jocelyn Shelton MRN: [id number] DOB: [birth year] Today's Date: 08/02/2021  History of Present Illness  Pt is a 66 y.o. female who presented 08/01/21 with worsening confusion and electrolyte derrangement and difficulty ambulating. MRI of head negative for acute intracranial abnormalities. MRI of lumbar spine negative for acute fxs or cord changes. 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 PMH: COPD, emphysema, HTN, HLD, CAD, GERD, dementia, avascular necrosis of bil humeral heads, MI, Raynauds syndrome, scleroderma, RA   Clinical Impression  Pt presents with condition above and deficits mentioned below, see PT Problem List. Pt lives with her daughter in an apartment with 14 STE. Of note, pt's husband passed away Aug 06, 2021. PTA, per phone call with daughter, pt was mod I using a SPC for mobility but required assistance for bed mobility and stairs. Pt also with hx of falls. At baseline, pt is minimally verbal and disoriented to most things other than herself and family with slow processing and initiation skills. Pt wears depends and needs cues to avoid donning multiple pairs of pants. Pt requires some assistance for dressing and supervision for standing showers at baseline. Currently, pt displays deficits in communication, cognition, balance, gross strength, and activity tolerance. She is at high risk for falls, requiring minA for transfers and to take a few steps with a RW today. Pt also requiring modA for bed mobility. Pt would benefit from short-term rehab at a SNF to maximize her independence and safety with all functional mobility, which the daughter is in agreement to. Discussed potential long-term options, like ALF or memory care facility also. Will continue to follow acutely.     Recommendations for follow up therapy are one component of a multi-disciplinary discharge planning process, led by the attending physician.  Recommendations may be updated based  on patient status, additional functional criteria and insurance authorization.  Follow Up Recommendations Skilled nursing-short term rehab (<3 hours/day)    Assistance Recommended at Discharge Frequent or constant Supervision/Assistance  Patient can return home with the following  A little help with walking and/or transfers;A lot of help with bathing/dressing/bathroom;Assistance with cooking/housework;Direct supervision/assist for medications management;Direct supervision/assist for financial management;Assist for transportation;Help with stairs or ramp for entrance    Equipment Recommendations Rolling walker (2 wheels);Hospital bed;Wheelchair (measurements PT);Wheelchair cushion (measurements PT) (pending progression)  Recommendations for Other Services       Functional Status Assessment Patient has had a recent decline in their functional status and demonstrates the ability to make significant improvements in function in a reasonable and predictable amount of time.     Precautions / Restrictions Precautions Precautions: Fall Restrictions Weight Bearing Restrictions: No      Mobility  Bed Mobility Overal bed mobility: Needs Assistance Bed Mobility: Supine to Sit;Sit to Supine     Supine to sit: Mod assist;HOB elevated Sit to supine: Mod assist;HOB elevated   General bed mobility comments: Pt requiring modA to manage legs and trunk with supine <> sit transitions, cuing pt to bring legs off EOB to sit up with slow initiation and to lean onto elbow to control trunk to lay supine.    Transfers Overall transfer level: Needs assistance Equipment used: Rolling walker (2 wheels) Transfers: Sit to/from Stand Sit to Stand: Min assist           General transfer comment: Pt with posterior bias, resting posterior aspect of legs on edge of stretcher with each rep sit <> stand. MinA to steady and facilitate anterior weight shift, x4 reps.  Ambulation/Gait Ambulation/Gait  assistance: Min assist Gait Distance (Feet): 4 Feet Assistive device: Rolling walker (2 wheels) Gait Pattern/deviations: Step-to pattern;Decreased stride length;Shuffle;Leaning posteriorly Gait velocity: reduced Gait velocity interpretation: <1.31 ft/sec, indicative of household ambulator   General Gait Details: Pt with posterior bias, needing cues to extend hips and facilitate anterior weight shift regularly. MinA to steady and cue for weight shifting. Pt takes steps laterally better than anteriorly, with very poor advancement noted.  Stairs            Wheelchair Mobility    Modified Rankin (Stroke Patients Only) Modified Rankin (Stroke Patients Only) Pre-Morbid Rankin Score: Moderate disability Modified Rankin: Moderately severe disability     Balance Overall balance assessment: Needs assistance Sitting-balance support: No upper extremity supported;Feet supported Sitting balance-Leahy Scale: Fair Sitting balance - Comments: Static sitting EOB with min guard-supervision for safety, no LOB. Postural control: Posterior lean Standing balance support: Reliant on assistive device for balance Standing balance-Leahy Scale: Poor Standing balance comment: Reliant on RW and up to minA to stand.                             Pertinent Vitals/Pain Pain Assessment: Faces Faces Pain Scale: Hurts a little bit Pain Location: generalized with mobility Pain Descriptors / Indicators: Grimacing Pain Intervention(s): Limited activity within patient's tolerance;Monitored during session;Repositioned    Home Living Family/patient expects to be discharged to:: Private residence Living Arrangements: Children;Other relatives (83 y.o. grandson) Available Help at Discharge: Family Type of Home: Apartment Home Access: Stairs to enter Entrance Stairs-Rails: Left;Right;Can reach both Entrance Stairs-Number of Steps: [place]: One level Home Equipment: BSC/3in1;Cane - single  point;Shower seat;Hand held shower head Additional Comments: Info obtained via phone call from daughter. Pt's husband just passed away 07-26-21.    Prior Function Prior Level of Function : History of Falls (last six months);Needs assist  Cognitive Assist : Mobility (cognitive);ADLs (cognitive) Mobility (Cognitive): Step by step cues ADLs (Cognitive): Step by step cues Physical Assist : Mobility (physical);ADLs (physical) Mobility (physical): Bed mobility;Stairs ADLs (physical): Dressing;IADLs Mobility Comments: Pt uses her cane and performs transfers and ambulates in home without assistance. She needs assistance for bed mobility and stairs. Hx of falls ADLs Comments: Pt tends to try to dress and bathe self (in standing) without assistance, but daughter helps with bra and pant management often and provides supervision for showers. Daughter assists in shower transfers. Pt now wars depends due to incontinence and sometimes puts on multiple pants at a time. Daughter manages meds and finances.     Hand Dominance        Extremity/Trunk Assessment   Upper Extremity Assessment Upper Extremity Assessment: Defer to OT evaluation    Lower Extremity Assessment Lower Extremity Assessment: Generalized weakness;Difficult to assess due to impaired cognition (noted functionally)    Cervical / Trunk Assessment Cervical / Trunk Assessment: Kyphotic  Communication   Communication: Other (comment) (minimally verbal and answering questions)  Cognition Arousal/Alertness: Awake/alert Behavior During Therapy: Flat affect Overall Cognitive Status: History of cognitive impairments - at baseline                                 General Comments: Pt oriented to self and family but daughter reports she does not believe the pt is aware of much else. Pt requires cues for mobility and ADLs at baseline, with min verbal response often  per daughter. Pt slow to process and initiate.        General  Comments General comments (skin integrity, edema, etc.): Increased time performing pericare due to pt with BM upon arrival    Exercises     Assessment/Plan    PT Assessment Patient needs continued PT services  PT Problem List Decreased strength;Decreased range of motion;Decreased activity tolerance;Decreased balance;Decreased coordination;Decreased mobility;Decreased knowledge of use of DME;Decreased cognition;Decreased safety awareness       PT Treatment Interventions DME instruction;Gait training;Functional mobility training;Therapeutic activities;Therapeutic exercise;Balance training;Neuromuscular re-education;Cognitive remediation;Patient/family education;Stair training    PT Goals (Current goals can be found in the Care Plan section)  Acute Rehab PT Goals Patient Stated Goal: did not state; daughter agreeable to SNF PT Goal Formulation: With patient/family Time For Goal Achievement: 08/16/21 Potential to Achieve Goals: Fair    Frequency Min 2X/week     Co-evaluation               AM-PAC PT "6 Clicks" Mobility  Outcome Measure Help needed turning from your back to your side while in a flat bed without using bedrails?: A Little Help needed moving from lying on your back to sitting on the side of a flat bed without using bedrails?: A Lot Help needed moving to and from a bed to a chair (including a wheelchair)?: A Little Help needed standing up from a chair using your arms (e.g., wheelchair or bedside chair)?: A Little Help needed to walk in hospital room?: Total Help needed climbing 3-5 steps with a railing? : Total 6 Click Score: 13    End of Session Equipment Utilized During Treatment: Gait belt Activity Tolerance: Patient tolerated treatment well Patient left: in bed;with call bell/phone within reach Nurse Communication: Mobility status PT Visit Diagnosis: Unsteadiness on feet (R26.81);Other abnormalities of gait and mobility (R26.89);Muscle weakness (generalized)  (M62.81);Difficulty in walking, not elsewhere classified (R26.2)    Time: 1113-1140 PT Time Calculation (min) (ACUTE ONLY): 27 min   Charges:   PT Evaluation $PT Eval Moderate Complexity: 1 Mod PT Treatments $Therapeutic Activity: 8-22 mins        Moishe Spice, PT, DPT Acute Rehabilitation Services  Pager: [phone] Office: [phone]   Orvan Falconer 08/02/2021, 1:04 PM

## 2021-08-02 NOTE — Evaluation (Signed)
SLP Cancellation Note  Patient Details Name: Jocelyn Shelton  MRN: [id number] DOB: 01/23/1956   Cancelled treatment:       Reason Eval/Treat Not Completed: Other (comment) (Swallow eval order received, pt passed [facility] swallow screen and RN Claris Che(Margaret) denies pt having issues swallowing medications.  Please reorder if indicated and/or order speech/cog evaluation. Thanks.)  Rolena Infanteammy K, MS [facility] SLP Acute Rehab Services Office [phone] Cell [phone]   Chales AbrahamsKimball, Byrne Capek Ann 08/02/2021, 9:51 AM

## 2021-08-02 NOTE — Evaluation (Signed)
Occupational Therapy Evaluation Patient Details Name: Jocelyn Shelton MRN: [id number] DOB: 03/26/1956 Today's Date: 08/02/2021   History of Present Illness Pt is a 66 y.o. female who presented 08/01/21 with worsening confusion and electrolyte derrangement and difficulty ambulating. MRI of head negative for acute intracranial abnormalities. MRI of lumbar spine negative for acute fxs or cord changes. 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 PMH: COPD, emphysema, HTN, HLD, CAD, GERD, dementia, avascular necrosis of bil humeral heads, MI, Raynauds syndrome, scleroderma, RA   Clinical Impression   Patient admitted for the diagnosis above.  PTA she lives with her daughter in a one level apartment with 14 STE.  Daughter assist with dressing, home management, meals, meds, bill payment, community mobility and supervision for bathing and mobility.  Deficits impacting independence largely surround cognitive functioning. Daughter needs the patient to complete 14 STE, currently the patient would need assist to complete this.  Given decreased assist at home, SNF is recommended for post acute rehab, and possibly transitioning to ALF/memory care level.         Recommendations for follow up therapy are one component of a multi-disciplinary discharge planning process, led by the attending physician.  Recommendations may be updated based on patient status, additional functional criteria and insurance authorization.   Follow Up Recommendations  Skilled nursing-short term rehab (<3 hours/day)    Assistance Recommended at Discharge Frequent or constant Supervision/Assistance  Patient can return home with the following Assistance with cooking/housework;Direct supervision/assist for medications management;Direct supervision/assist for financial management;Assist for transportation;Help with stairs or ramp for entrance;A little help with bathing/dressing/bathroom    Functional Status Assessment  Patient has had a recent  decline in their functional status and demonstrates the ability to make significant improvements in function in a reasonable and predictable amount of time.  Equipment Recommendations  None recommended by OT    Recommendations for Other Services       Precautions / Restrictions Precautions Precautions: Fall Restrictions Weight Bearing Restrictions: No      Mobility Bed Mobility Overal bed mobility: Needs Assistance Bed Mobility: Supine to Sit;Sit to Supine     Supine to sit: Mod assist;HOB elevated Sit to supine: Mod assist;HOB elevated        Transfers Overall transfer level: Needs assistance   Transfers: Sit to/from Stand Sit to Stand: Min assist                  Balance Overall balance assessment: Needs assistance Sitting-balance support: Feet supported;No upper extremity supported Sitting balance-Leahy Scale: Fair   Postural control: Posterior lean Standing balance support: Reliant on assistive device for balance Standing balance-Leahy Scale: Poor Standing balance comment: HHA with patient holding onto OT's upper arms                           ADL either performed or assessed with clinical judgement   ADL Overall ADL's : Needs assistance/impaired     Grooming: Wash/dry hands;Set up;Sitting;Wash/dry face;Cueing for sequencing           Upper Body Dressing : Minimal assistance;Cueing for sequencing;Sitting   Lower Body Dressing: Minimal assistance;Sitting/lateral leans;Cueing for sequencing;Moderate assistance Lower Body Dressing Details (indicate cue type and reason): min guard for balance sitting on edge of stretcher to place socks             Functional mobility during ADLs: Minimal assistance;Cueing for sequencing       Vision Baseline Vision/History: 1 Wears glasses Patient Visual Report: No  change from baseline       Perception Perception Perception: Not tested   Praxis Praxis Praxis: Not tested    Pertinent  Vitals/Pain Pain Assessment: Faces Faces Pain Scale: No hurt Pain Intervention(s): Monitored during session     Hand Dominance Right   Extremity/Trunk Assessment Upper Extremity Assessment Upper Extremity Assessment: Generalized weakness   Lower Extremity Assessment Lower Extremity Assessment: Defer to PT evaluation   Cervical / Trunk Assessment Cervical / Trunk Assessment: Kyphotic   Communication Communication Communication: Other (comment) (answers with one word responses)   Cognition Arousal/Alertness: Awake/alert Behavior During Therapy: Flat affect Overall Cognitive Status: History of cognitive impairments - at baseline                                       General Comments   VSS on RA    Exercises     Shoulder Instructions      Home Living Family/patient expects to be discharged to:: Private residence Living Arrangements: Children;Other relatives Available Help at Discharge: Family Type of Home: Apartment Home Access: Stairs to enter [employer] of Steps: 14 Entrance Stairs-Rails: Left;Right;Can reach both Home Layout: One level     Bathroom Shower/Tub: Chief Strategy Officer: Standard     Home Equipment: BSC/3in1;Cane - single point;Shower seat;Hand held shower head          Prior Functioning/Environment    Cognitive Assist : Mobility (cognitive);ADLs (cognitive) Mobility (Cognitive): Step by step cues ADLs (Cognitive): Step by step cues Physical Assist : Mobility (physical);ADLs (physical) Mobility (physical): Bed mobility;Stairs ADLs (physical): Dressing;IADLs Mobility Comments: Pt uses her cane and performs transfers and ambulates in home without assistance. She needs assistance for bed mobility and stairs. Hx of falls ADLs Comments: Information gathered from PT eval... PT able to contact daughter for the above information.        OT Problem List: Decreased strength;Decreased activity  tolerance;Impaired balance (sitting and/or standing);Decreased cognition      OT Treatment/Interventions: Self-care/ADL training;Therapeutic activities;Balance training    OT Goals(Current goals can be found in the care plan section) Acute Rehab OT Goals OT Goal Formulation: Patient unable to participate in goal setting Time For Goal Achievement: 08/16/21 Potential to Achieve Goals: Fair ADL Goals Pt Will Perform Grooming: with supervision;standing Pt Will Perform Lower Body Bathing: with supervision;sit to/from stand Pt Will Perform Lower Body Dressing: sit to/from stand;with supervision Pt Will Transfer to Toilet: with supervision;ambulating;regular height toilet Pt Will Perform Toileting - Clothing Manipulation and hygiene: with supervision;sitting/lateral leans  OT Frequency: Min 2X/week    Co-evaluation              AM-PAC OT "6 Clicks" Daily Activity     Outcome Measure Help from another person eating meals?: A Little Help from another person taking care of personal grooming?: A Little Help from another person toileting, which includes using toliet, bedpan, or urinal?: A Little Help from another person bathing (including washing, rinsing, drying)?: A Lot Help from another person to put on and taking off regular upper body clothing?: A Little Help from another person to put on and taking off regular lower body clothing?: A Lot 6 Click Score: 16   End of Session Equipment Utilized During Treatment: Gait belt Nurse Communication: Mobility status  Activity Tolerance: Patient tolerated treatment well Patient left: in bed;with call bell/phone within reach  OT Visit Diagnosis: Unsteadiness on feet (R26.81);Other  symptoms and signs involving cognitive function                Time: [id number] OT Time Calculation (min): 16 min Charges:  OT General Charges $OT Visit: 1 Visit OT Evaluation $OT Eval Moderate Complexity: 1 Mod  08/02/2021  RP, OTR/L  Acute Rehabilitation  Services  Office:  [phone]   Suzanna ObeyRichard D Veneta Sliter 08/02/2021, 4:44 PM

## 2021-08-02 NOTE — ED Notes (Signed)
Daughter called and she was updated on POC

## 2021-08-02 NOTE — ED Notes (Signed)
Patient transported to MRI 

## 2021-08-03 DIAGNOSIS — R410 Disorientation, unspecified: Secondary | ICD-10-CM | POA: Diagnosis not present

## 2021-08-03 DIAGNOSIS — F03B3 Unspecified dementia, moderate, with mood disturbance: Secondary | ICD-10-CM | POA: Diagnosis not present

## 2021-08-03 DIAGNOSIS — E876 Hypokalemia: Secondary | ICD-10-CM | POA: Diagnosis not present

## 2021-08-03 LAB — BASIC METABOLIC PANEL
Anion gap: 10 (ref 5–15)
BUN: 7 mg/dL — ABNORMAL LOW (ref 8–23)
CO2: 29 mmol/L (ref 22–32)
Calcium: 8.6 mg/dL — ABNORMAL LOW (ref 8.9–10.3)
Chloride: 97 mmol/L — ABNORMAL LOW (ref 98–111)
Creatinine, Ser: 1.01 mg/dL — ABNORMAL HIGH (ref 0.44–1.00)
GFR, Estimated: >60 mL/min (ref >60)
Glucose, Bld: 108 mg/dL — ABNORMAL HIGH (ref 70–99)
Potassium: 3.2 mmol/L — ABNORMAL LOW (ref 3.5–5.1)
Sodium: 136 mmol/L (ref 135–145)

## 2021-08-03 LAB — MAGNESIUM: Magnesium: 2.3 mg/dL (ref 1.7–2.4)

## 2021-08-03 MED ORDER — BUPIVACAINE HCL (PF) 0.25 % IJ SOLN
INTRAMUSCULAR | Status: AC
  Filled 2021-08-03: qty 30

## 2021-08-03 MED ORDER — POTASSIUM CHLORIDE CRYS ER 20 MEQ PO TBCR
40.0000 meq | EXTENDED_RELEASE_TABLET | Freq: Once | ORAL | Status: AC
  Administered 2021-08-03: 40 meq via ORAL
  Filled 2021-08-03: qty 2

## 2021-08-03 NOTE — Progress Notes (Signed)
Family Medicine Teaching Service Daily Progress Note Intern Pager: [phone]  Patient name: Jocelyn Shelton Medical record number: [id number] Date of birth: 01/14/1956 Age: 66 y.o. Gender: female  Primary Care Provider: Bess Kinds, MD Consultants: None Code Status: Full code  Pt Overview and Major Events to Date:  1/7-admitted for altered mental status  Assessment and Plan:  Jocelyn Shelton is a 66 y.o. female presenting with worsening confusion and electrolyte derrangement. PMH is significant for COPD/emphysema, hypertension, hyperlipidemia, CAD, GERD, MDD.  Delirium on dementia Spoke with patient's daughter today who reports that at baseline the patient will have days where she only answers yes to all questions and other days will she will answer no to all questions.  Reports that at home she has not had any difficulty ambulating but with her worsening dementia it has been very difficult to take care of her at home because she is the only caregiver.MRI of brain showing no intracranial abnormality.  The was mild signal changes in the brain compatible with chronic small vessel disease including a small chronic lacunar infarct in the left cerebellum.  Partially visible cervical spine degeneration with up to mild spinal stenosis.  Physical therapy and Occupational Therapy saw the patient yesterday and recommended SNF with possible transition to ALF/memory care unit.  This morning patient is oriented to person and place but not time. - Continue delirium precautions - Appreciate PT/OT's assistance - Patient is stable for discharge to SNF  Hypokalemia On arrival patient's potassium was 2.5.  Received 5 rounds of IV replacement.  Potassium increased to 3.8.  This morning potassium at 3.2.  Magnesium 2.3. - Replete with 40 mEq of K-Dur. - Continue morning BMPs - Patient will likely need to leave on potassium supplementation  Hypertension Blood pressures over the last 24 hours ranging from  145/91-152/83.  Currently on irbesartan 300 mg daily as well as hydrochlorothiazide. - Continue to monitor blood pressures - Continue home medications  FEN/GI: Heart healthy PPx: Lovenox Dispo:SNF  when placement is available . Barriers include none.   Subjective:  Patient is pleasant this morning.  Oriented to person and place.  Reports that her daughter sent her to the hospital to get evaluated.  Spoke with the patient's daughter who reports that her dementia has been worsening and that she will have days where she will only say yes or only say no to questions.  Reports that she has not had difficulty ambulating at home.  Patient's daughter reports that she has become too much for her allowing to take care of.  Objective: Temp:  [97.8 F (36.6 C)-98.3 F (36.8 C)] 98.3 F (36.8 C) (01/08 1124) Pulse Rate:  [61-92] 70 (01/08 0754) Resp:  [15-23] 16 (01/08 1124) BP: (126-157)/(83-110) 152/83 (01/08 1124) SpO2:  [99 %-100 %] 100 % (01/08 1124) Weight:  [68.8 kg] 68.8 kg (01/07 2145) Physical Exam: General: Pleasant, resting comfortably in bed Cardiovascular: Regular rate Respiratory: Normal work of breathing, lungs clear to auscultation Abdomen: Soft, nontender, positive bowel sounds Extremities: No gross abnormalities Neuro: Cranial nerves grossly intact, alert and oriented to person and place  Laboratory: Recent Labs  Lab 07/31/21 1112 08/01/21 1620 08/02/21 0234 08/02/21 0235  WBC 5.6 4.6 7.8  --   HGB 12.2 13.4 13.4 14.3  HCT 37.3 39.4 41.0 42.0  PLT 154 133* 148*  --    Recent Labs  Lab 07/31/21 1112 07/31/21 1112 08/01/21 1620 08/02/21 0234 08/02/21 0235 08/02/21 1411 08/03/21 0157  NA 145*   < >  135 138 138 142 136  K 3.3*  --  2.5* 3.2* 2.9* 3.8 3.2*  CL 101  --  97* 95*  --  100 97*  CO2 29  --  28 32  --  21* 29  BUN 7*   < > 5* 9  --  11 7*  CREATININE 1.13*  --  1.05* 1.31*  --  1.36* 1.01*  CALCIUM 9.1  --  8.4* 8.4*  --  8.2* 8.6*  PROT 7.0  --   7.3 7.4  --   --   --   BILITOT 0.5  --  0.9 0.9  --   --   --   ALKPHOS 124*  --  110 107  --   --   --   ALT 10  --  12 13  --   --   --   AST 20  --  22 22  --   --   --   GLUCOSE 95   < > 101* 143*  --  141* 108*   < > = values in this interval not displayed.    Imaging/Diagnostic Tests: DG Abdomen 1 View  Result Date: 08/02/2021 CLINICAL DATA:  MRI prep. EXAM: ABDOMEN - 1 VIEW COMPARISON:  None. FINDINGS: The bowel gas pattern is normal. No radio-opaque calculi or other significant radiographic abnormality are seen. No radiopaque foreign body. IMPRESSION: Normal bowel-gas pattern.  No radiopaque foreign body. Electronically Signed   By: Thornell Sartorius M.D.   On: 08/02/2021 04:54   CT HEAD WO CONTRAST ( )  Result Date: 08/01/2021 CLINICAL DATA:  Altered mental status. EXAM: CT HEAD WITHOUT CONTRAST TECHNIQUE: Contiguous axial images were obtained from the base of the skull through the vertex without intravenous contrast. COMPARISON:  March 28, 2019 FINDINGS: Brain: There is mild cerebral atrophy with widening of the extra-axial spaces and ventricular dilatation. There are areas of decreased attenuation within the white matter tracts of the supratentorial brain, consistent with microvascular disease changes. Vascular: No hyperdense vessel or unexpected calcification. Skull: Normal. Negative for fracture or focal lesion. Sinuses/Orbits: No acute finding. Other: None. IMPRESSION: 1. Generalized cerebral atrophy. 2. No acute intracranial abnormality. Electronically Signed   By: Aram Candela M.D.   On: 08/01/2021 23:26   MR BRAIN WO CONTRAST  Result Date: 08/02/2021 CLINICAL DATA:  66 year old female with unexplained altered mental status. EXAM: MRI HEAD WITHOUT CONTRAST TECHNIQUE: Multiplanar, multiecho pulse sequences of the brain and surrounding structures were obtained without intravenous contrast. COMPARISON:  Head CT 08/01/2021 and earlier. FINDINGS: Brain: Stable cerebral volume. No  restricted diffusion to suggest acute infarction. No midline shift, mass effect, evidence of mass lesion, ventriculomegaly, extra-axial collection or acute intracranial hemorrhage. Cervicomedullary junction and pituitary are within normal limits. Mild for age mostly periventricular white matter T2 and FLAIR hyperintensity is nonspecific. No cortical encephalomalacia. No chronic cerebral blood products identified. Faint T2 hyperintensity in the left pons, and there is a small chronic lacunar infarct of the left cerebellum (series 5, image 8). But otherwise the deep gray matter nuclei, brainstem and cerebellum appear negative. Vascular: Major intracranial vascular flow voids are preserved. The distal right vertebral artery appears dominant. Skull and upper cervical spine: Partially visible cervical spine disc degeneration with up to mild degenerative spinal stenosis. Visualized bone marrow signal is within normal limits. Sinuses/Orbits: Negative orbits. Paranasal sinuses and mastoids are stable and well aerated. Other: Grossly normal visible internal auditory structures. Normal stylomastoid foramina. Negative visible scalp and face. IMPRESSION:  1. No acute intracranial abnormality. 2. Mild for age signal changes in the brain compatible with chronic small vessel disease, including a small chronic lacunar infarct in the left cerebellum. 3. Partially visible cervical spine degeneration with up to mild spinal stenosis. Electronically Signed   By: Odessa Fleming M.D.   On: 08/02/2021 08:21   MR LUMBAR SPINE WO CONTRAST  Result Date: 08/02/2021 CLINICAL DATA:  No fecal and urinary incontinence. EXAM: MRI LUMBAR SPINE WITHOUT CONTRAST TECHNIQUE: Multiplanar, multisequence MR imaging of the lumbar spine was performed. No intravenous contrast was administered. COMPARISON:  None. FINDINGS: Segmentation:  Standard. Alignment:  Physiologic. Vertebrae:  No fracture, evidence of discitis, or bone lesion. Conus medullaris and cauda  equina: Conus extends to the L1 level. Conus and cauda equina appear normal. Paraspinal and other soft tissues: Negative. Disc levels: T12-L1: No significant disc bulge. No neural foraminal stenosis. No central canal stenosis. L1-L2: No significant disc bulge. No neural foraminal stenosis. No central canal stenosis. L2-L3: Mild circumferential disc protrusion with encroachment of the left lateral recess. No significant neural foraminal narrowing. L3-L4: Circumferential disc protrusion and ligamentum flavum hypertrophy with narrowing of spinal canal. No significant neural foraminal narrowing. L4-L5: Moderate to severe circumferential disc protrusion and ligamentum flavum hypertrophy with marked narrowing of spinal canal. No significant neural foraminal narrowing. L5-S1: Circumferential disc protrusion with asymmetric narrowing of the right lateral recess. Ligamentum flavum hypertrophy. Mild narrowing of spinal canal. No significant neural foraminal narrowing. IMPRESSION: 1.  No evidence of acute fracture or subluxation. 2.  Visualized distal cord and cauda equina is within normal limits. 3. Multilevel degenerative disc disease prominent at L4-L5 and L5-S1. Electronically Signed   By: Larose Hires D.O.   On: 08/02/2021 08:46   DG Chest Port 1 View  Result Date: 08/02/2021 CLINICAL DATA:  Altered mental status. EXAM: PORTABLE CHEST 1 VIEW COMPARISON:  04/09/2020. FINDINGS: The heart is mildly enlarged. Atherosclerotic calcification of the aorta is noted. The mediastinal contour is stable. Stable scarring is noted at the left lung base. No consolidation, effusion, or pneumothorax. No acute osseous abnormality. IMPRESSION: Cardiomegaly with no acute process. Electronically Signed   By: Thornell Sartorius M.D.   On: 08/02/2021 00:14     Derrel Nip, MD 08/03/2021, 2:00 PM PGY-3, [facility] Family Medicine FPTS Intern pager: [phone], text pages welcome

## 2021-08-03 NOTE — NC FL2 (Signed)
[place] LEVEL OF CARE SCREENING TOOL     IDENTIFICATION  Patient Name: Jocelyn Shelton Birthdate: [birth year] Sex: female Admission Date (Current Location): 08/01/2021  [facility] [place] Number:  Herbalist and Address:  [facility] [place], [place] [address], [place], [place] [address]      Provider Number: [id number]  Attending Physician Name and Address:  Zenia Resides, MD  Relative Name and Phone Number:  April Hallam, [phone]    Current Level of Care: Hospital Recommended Level of Care: [place] Prior Approval Number:    Date Approved/Denied:   PASRR Number: [id number]  Discharge Plan: SNF    Current Diagnoses: Patient Active Problem List   Diagnosis Date Noted   AMS (altered mental status) 08/02/2021   Dementia without behavioral disturbance, psychotic disturbance, mood disturbance, or anxiety ([place])    Alteration of awareness 07/31/2021   Memory difficulty 07/30/2021   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   Abnormality of heart beat 07/07/2019   Centrilobular emphysema ([place]) 07/29/2017   ILD (interstitial lung disease) ([place]) 07/29/2017   Pulmonary hypertension ([place]), Possible 06/17/2017   Coronary artery disease involving native coronary artery of native heart without angina pectoris    Scleroderma ([place]) 03/08/2016   Immunosuppressed status ([place])    COPD (chronic obstructive pulmonary disease) ([place]) 08/30/2015   Rheumatoid arthritis ([place]) 03/14/2013   Hypokalemia 02/28/2013   GERD (gastroesophageal reflux disease) 08/02/2008   Major depressive disorder, recurrent episode ([place]) 09/23/2006   Tobacco use disorder 09/23/2006   Primary hypertension 09/23/2006    Orientation RESPIRATION BLADDER Height & Weight     Self, Place  Normal Incontinent, External catheter Weight: 151 lb 10.8 oz (68.8 kg) Height:     BEHAVIORAL SYMPTOMS/MOOD NEUROLOGICAL BOWEL NUTRITION STATUS      Incontinent Diet  (See DC summary)  AMBULATORY STATUS COMMUNICATION OF NEEDS Skin   Extensive Assist Verbally Normal                       Personal Care Assistance Level of Assistance  Bathing, Feeding, Dressing Bathing Assistance: Maximum assistance Feeding assistance: Independent Dressing Assistance: Maximum assistance     Functional Limitations Info  Sight, Hearing, Speech Sight Info: Adequate Hearing Info: Adequate Speech Info: Adequate    SPECIAL CARE FACTORS FREQUENCY  PT (By licensed PT), OT (By licensed OT)     PT Frequency: 5x week OT Frequency: 5x week            Contractures Contractures Info: Not present    Additional Factors Info  Code Status, Allergies Code Status Info: Full Allergies Info: Ticagrelor   Cymbalta (Duloxetine Hcl)   Lisinopril   Olanzapine   Tomato           Current Medications (08/03/2021):  This is the current hospital active medication list Current Facility-Administered Medications  Medication Dose Route Frequency Provider Last Rate Last Admin   albuterol (PROVENTIL) (2.5 MG/3ML) 0.083% nebulizer solution 2.5 mg  2.5 mg Inhalation Q4H PRN Lurline Del, DO       aspirin EC tablet 81 mg  81 mg Oral Daily Lurline Del, DO   81 mg at 08/03/21 [id number]   atorvastatin (LIPITOR) tablet 40 mg  40 mg Oral q1800 Lurline Del, DO   40 mg at 08/02/21 1804   calcium-vitamin D (OSCAL WITH D) 500-5 MG-MCG per tablet 1 tablet  1 tablet Oral Daily Lurline Del, DO   1 tablet at 08/03/21  [id number]   carvedilol (COREG) tablet 25 mg  25 mg Oral BID WC Welborn, Ryan, DO   25 mg at 08/03/21 [id number]   diltiazem (CARDIZEM CD) 24 hr capsule 180 mg  180 mg Oral Daily Lurline Del, DO   180 mg at 08/03/21 0833   enoxaparin (LOVENOX) injection 40 mg  40 mg Subcutaneous Q24H Lurline Del, DO   40 mg at [id number]   folic acid (FOLVITE) tablet 1 mg  1 mg Oral Daily Lurline Del, DO   1 mg at 08/03/21 [id number]   hydrochlorothiazide (HYDRODIURIL) tablet 12.5 mg  12.5 mg Oral Daily  Lurline Del, DO   12.5 mg at 08/03/21 [id number]   irbesartan (AVAPRO) tablet 300 mg  300 mg Oral Daily Lurline Del, DO   300 mg at 08/03/21 [id number]   LORazepam (ATIVAN) injection 0.5 mg  0.5 mg Intravenous PRN Regan Lemming, MD       LORazepam (ATIVAN) injection 1 mg  1 mg Intravenous PRN Regan Lemming, MD       LORazepam (ATIVAN) tablet 0.5 mg  0.5 mg Oral PRN Regan Lemming, MD       predniSONE (DELTASONE) tablet 3 mg  3 mg Oral Daily Welborn, Ryan, DO   3 mg at 08/03/21 [id number]   sulfaSALAzine (AZULFIDINE) tablet 1,000 mg  1,000 mg Oral BID Lurline Del, DO   1,000 mg at 08/03/21 [id number]     Discharge Medications: Please see discharge summary for a list of discharge medications.  Relevant Imaging Results:  Relevant Lab Results:   Additional Information SS# [id number] [place]

## 2021-08-03 NOTE — Plan of Care (Signed)
°  Problem: Education: Goal: Knowledge of General Education information will improve Description: Including pain rating scale, medication(s)/side effects and non-pharmacologic comfort measures Outcome: Progressing   Problem: Health Behavior/Discharge Planning: Goal: Ability to manage health-related needs will improve Outcome: Progressing   Problem: Clinical Measurements: Goal: Ability to maintain clinical measurements within normal limits will improve Outcome: Progressing Goal: Will remain free from infection Outcome: Progressing Goal: Diagnostic test results will improve Outcome: Progressing Goal: Respiratory complications will improve Outcome: Progressing Goal: Cardiovascular complication will be avoided Outcome: Progressing   Problem: Activity: Goal: Risk for activity intolerance will decrease Outcome: Progressing   Problem: Nutrition: Goal: Adequate nutrition will be maintained Outcome: Progressing   Problem: Coping: Goal: Level of anxiety will decrease Outcome: Progressing   Problem: Elimination: Goal: Will not experience complications related to bowel motility Outcome: Progressing Goal: Will not experience complications related to urinary retention Outcome: Progressing   Problem: Pain Managment: Goal: General experience of comfort will improve Outcome: Progressing   Problem: Safety: Goal: Ability to remain free from injury will improve Outcome: Progressing   Problem: Skin Integrity: Goal: Risk for impaired skin integrity will decrease Outcome: Progressing   Problem: Education: Goal: Knowledge of General Education information will improve Description: Including pain rating scale, medication(s)/side effects and non-pharmacologic comfort measures Outcome: Progressing   Problem: Clinical Measurements: Goal: Will remain free from infection Outcome: Progressing   Problem: Clinical Measurements: Goal: Respiratory complications will improve Outcome:  Progressing

## 2021-08-03 NOTE — Progress Notes (Signed)
Went to see patient at the start of night shift.  She had no concerns or complaints.  She was alert and oriented to person and place, not year.  This is an improvement from when I admitted her few nights prior.  She remains slow to respond to questions.  She had no other concerns and was in the apparent distress.  BP 131/72 (BP Location: Right Arm)    Pulse 62    Temp 98.5 F (36.9 C) (Oral)    Resp 14    Wt 68.8 kg    SpO2 100%    BMI 24.48 kg/m  General: Elderly female lying in bed, alert and oriented to person, place, not year.  She is slow to respond to questions Respiratory: Lungs clear to auscultation Cardiac: S1, S2 present  A/P Admitted for altered mental status.  She seems improved from the time I admitted her but still shows some signs of confusion with delay in responding to questions, being alert and oriented only to person and place, not year.  Reviewed today's notes, labs, vitals.  Remainder per daytime progress note

## 2021-08-03 NOTE — TOC Initial Note (Signed)
Transition of Care [facility]) - Initial/Assessment Note    Patient Details  Name: Jocelyn Shelton MRN: [id number] Date of Birth: [birth year]  [facility]) CM/SW Contact:    Carley Hammed, LCSWA Phone Number: 08/03/2021, 11:13 AM  Clinical Narrative:                 CSW noted pt is disoriented at this time and spoke with dtr April to discuss SNF placement and long term plans for after rehab. Pt currently only has Medicaid on file, but April states she also has Medicare, and will have her son provide that information to CSW, to update information. Dtr is agreeable to SNF placement as she notes it has been increasingly difficult to care for her mother at home, with her worsening memory problems. She notes no preference, but would like to stay in [place]. Medicare.gov info provided. Pt has had 3 covid vaccines.  CSW and dtr discussed plans for after SNF, and CSW recommended that they begin the process in finding ALF or MC, dtr noted understanding. CSW will complete workup. CSW will hold off on sending referral's until Medicare is confirmed as facilities may decline based on insurance. TOC will continue to follow.  Expected Discharge Plan: Skilled Nursing Facility Barriers to Discharge: Continued Medical Work up, SNF Pending bed offer, SNF Pending transportation, Insurance Authorization   Patient Goals and CMS Choice Patient states their goals for this hospitalization and ongoing recovery are:: Pt disoriented and unable to participate in goal setting. CMS Medicare.gov Compare Post Acute Care list provided to:: Patient Represenative (must comment) (April, Daughter) Choice offered to / list presented to : Adult Children  Expected Discharge Plan and Services Expected Discharge Plan: Skilled Nursing Facility     Post Acute Care Choice: Skilled Nursing Facility Living arrangements for the past 2 months: Single Family Home                                      Prior Living  Arrangements/Services Living arrangements for the past 2 months: Single Family Home Lives with:: Adult Children Patient language and need for interpreter reviewed:: Yes Do you feel safe going back to the place where you live?: Yes      Need for Family Participation in Patient Care: Yes (Comment) Care giver support system in place?: Yes (comment)   Criminal Activity/Legal Involvement Pertinent to Current Situation/Hospitalization: No - Comment as needed  Activities of Daily Living      Permission Sought/Granted Permission sought to share information with : Family Supports Permission granted to share information with : Yes, Verbal Permission Granted  Share Information with NAME: April Worster     Permission granted to share info w Relationship: Daughter  Permission granted to share info w Contact Information: [phone]  Emotional Assessment Appearance:: Appears stated age Attitude/Demeanor/Rapport: Unable to Assess Affect (typically observed): Unable to Assess Orientation: : Oriented to Self, Oriented to Place Alcohol / Substance Use: Not Applicable Psych Involvement: No (comment)  Admission diagnosis:  Disorientation [R41.0] AMS (altered mental status) [R41.82] Patient Active Problem List   Diagnosis Date Noted   AMS (altered mental status) 08/02/2021   Dementia without behavioral disturbance, psychotic disturbance, mood disturbance, or anxiety (HCC)    Alteration of awareness 07/31/2021   Memory difficulty 07/30/2021   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   Abnormality of heart beat 07/07/2019   Centrilobular emphysema (HCC) 07/29/2017  ILD (interstitial lung disease) ([place]) 07/29/2017   Pulmonary hypertension ([place]), Possible 06/17/2017   Coronary artery disease involving native coronary artery of native heart without angina pectoris    Scleroderma ([place]) 03/08/2016   Immunosuppressed status ([place])    COPD (chronic obstructive pulmonary disease) ([place]) 08/30/2015    Rheumatoid arthritis ([place]) 03/14/2013   Hypokalemia 02/28/2013   GERD (gastroesophageal reflux disease) 08/02/2008   Major depressive disorder, recurrent episode ([place]) 09/23/2006   Tobacco use disorder 09/23/2006   Primary hypertension 09/23/2006   PCP:  Holley Bouche, MD Pharmacy:   [id number] - [place], [place] - [place] [id number] [place] [id number] Phone: [phone] Fax: [phone]     Social Determinants of Health (SDOH) Interventions    Readmission Risk Interventions No flowsheet data found.

## 2021-08-04 DIAGNOSIS — F03B0 Unspecified dementia, moderate, without behavioral disturbance, psychotic disturbance, mood disturbance, and anxiety: Secondary | ICD-10-CM

## 2021-08-04 DIAGNOSIS — R410 Disorientation, unspecified: Secondary | ICD-10-CM | POA: Diagnosis not present

## 2021-08-04 DIAGNOSIS — E876 Hypokalemia: Secondary | ICD-10-CM | POA: Diagnosis not present

## 2021-08-04 DIAGNOSIS — N182 Chronic kidney disease, stage 2 (mild): Secondary | ICD-10-CM

## 2021-08-04 DIAGNOSIS — F03B3 Unspecified dementia, moderate, with mood disturbance: Secondary | ICD-10-CM | POA: Diagnosis not present

## 2021-08-04 LAB — BASIC METABOLIC PANEL
Anion gap: 8 (ref 5–15)
BUN: 11 mg/dL (ref 8–23)
CO2: 30 mmol/L (ref 22–32)
Calcium: 8.8 mg/dL — ABNORMAL LOW (ref 8.9–10.3)
Chloride: 95 mmol/L — ABNORMAL LOW (ref 98–111)
Creatinine, Ser: 1.02 mg/dL — ABNORMAL HIGH (ref 0.44–1.00)
GFR, Estimated: >60 mL/min (ref >60)
Glucose, Bld: 115 mg/dL — ABNORMAL HIGH (ref 70–99)
Potassium: 3.4 mmol/L — ABNORMAL LOW (ref 3.5–5.1)
Sodium: 133 mmol/L — ABNORMAL LOW (ref 135–145)

## 2021-08-04 LAB — MAGNESIUM: Magnesium: 2 mg/dL (ref 1.7–2.4)

## 2021-08-04 MED ORDER — POTASSIUM CHLORIDE 20 MEQ PO PACK
40.0000 meq | PACK | Freq: Once | ORAL | Status: AC
  Administered 2021-08-04: 40 meq via ORAL
  Filled 2021-08-04: qty 2

## 2021-08-04 MED ORDER — POTASSIUM CHLORIDE CRYS ER 20 MEQ PO TBCR: 40.0000 meq | EXTENDED_RELEASE_TABLET | Freq: Once | ORAL | Status: DC

## 2021-08-04 NOTE — Progress Notes (Addendum)
Family Medicine Teaching Service Daily Progress Note Intern Pager: [phone]  Patient name: Jocelyn Shelton Medical record number: [id number] Date of birth: July 13, 1956 Age: 66 y.o. Gender: female  Primary Care Provider: Bess Kinds, MD Consultants: None Code Status: Full  Pt Overview and Major Events to Date:  1/7-admitted for altered mental status  Assessment and Plan:  [name] is a 66 y.o. female presenting with worsening confusion and electrolyte derrangement. PMH is significant for COPD/emphysema, hypertension, hyperlipidemia, CAD, GERD, MDD. Patient is medically stable and awaiting SNF placement when bed available.   Delirium on dementia Patient alert and oriented to self and place. Continues to take long periods to respond to questions, often has blank stare, needing multiple times asking question, before responding. This is considered to be her baseline. Patient is medically stable and awaiting SNF placement when bed available.  -Continue delerium precautions -Appreciate PT/OT assistance -Patient is stable for discharge to SNF/ALF  Hypokalemia Pt s/p 5 rounds IV K replacemnt, this morning K at 3.4, 40 meq given at 0453 -Continue am BMP -Patient may need daily potassium supplementation  Hyponatremia Pt Na at 133 today, down from (136, 142) days prior -Continue to monitor  CKD II GFR > 60. Cr 1.02 (1.01, 1.36, 1.31, 1.05) -Continue to monitor  Hypertension BP well controlled at 131-141/72-77.  -Continue irbesartan 300 mg daily -Continue HCTZ 12.5 mg daily  All other medical conditions, chronic and stable COPD HFpEF CAD HLD  FEN/GI: Heart Healthy PPx: Lovenox Dispo:SNF  when placement available .   Subjective:  [name], patient continues to be slow to respond, needing multiple cues with questions, before answering.   Objective: Temp:  [98.1 F (36.7 C)-98.5 F (36.9 C)] 98.1 F (36.7 C) (01/09 0357) Pulse Rate:  [62-70] 65 (01/09 0357) Resp:   [14-21] 16 (01/09 0357) BP: (122-152)/(72-91) 141/77 (01/09 0357) SpO2:  [100 %] 100 % (01/09 0357) Physical Exam: General: Well appearing, qiuet, blank staring, NAD, African American female Cardiovascular: RRR, NRMG Respiratory: CTABL Abdomen: Soft, NTTP, non-distended Extremities: Moving all extremities independently  Laboratory: Recent Labs  Lab 07/31/21 1112 08/01/21 1620 08/02/21 0234 08/02/21 0235  WBC 5.6 4.6 7.8  --   HGB 12.2 13.4 13.4 14.3  HCT 37.3 39.4 41.0 42.0  PLT 154 133* 148*  --    Recent Labs  Lab 07/31/21 1112 07/31/21 1112 08/01/21 1620 08/02/21 0234 08/02/21 0235 08/02/21 1411 08/03/21 0157 08/04/21 0236  NA 145*   < > 135 138   < > 142 136 133*  K 3.3*  --  2.5* 3.2*   < > 3.8 3.2* 3.4*  CL 101  --  97* 95*  --  100 97* 95*  CO2 29  --  28 32  --  21* 29 30  BUN 7*   < > 5* 9  --  11 7* 11  CREATININE 1.13*  --  1.05* 1.31*  --  1.36* 1.01* 1.02*  CALCIUM 9.1  --  8.4* 8.4*  --  8.2* 8.6* 8.8*  PROT 7.0  --  7.3 7.4  --   --   --   --   BILITOT 0.5  --  0.9 0.9  --   --   --   --   ALKPHOS 124*  --  110 107  --   --   --   --   ALT 10  --  12 13  --   --   --   --   AST  20  --  22 22  --   --   --   --   GLUCOSE 95   < > 101* 143*  --  141* 108* 115*   < > = values in this interval not displayed.      Imaging/Diagnostic Tests:   Bess Kinds, MD 08/04/2021, 6:34 AM PGY-1, [facility] Family Medicine FPTS Intern pager: [phone], text pages welcome

## 2021-08-04 NOTE — Progress Notes (Signed)
FPTS Brief Note Reviewed patient's vitals, recent notes.  Vitals:   08/04/21 1556 08/04/21 1950  BP:  114/70  Pulse: 64 (!) 59  Resp:  17  Temp: 98.1 F (36.7 C) 97.7 F (36.5 C)  SpO2: 100% 100%   At this time, no change in plan from day progress note.  Gerrit Heck, MD Page [phone] with questions about this patient.

## 2021-08-04 NOTE — TOC Progression Note (Signed)
Transition of Care [facility](TOC) - Progression Note    Patient Details  Name: Jocelyn Shelton  MRN: [id number] Date of Birth: 12/06/1955  Transition of Care [facility](TOC) CM/SW Contact  Baldemar LenisElizabeth Shelton [place], [place] Phone Number: 08/04/2021, 12:13 PM  Clinical Narrative:   CSW received copy of patient's Medicare card from patient's daughter. CSW faxed copy of card to Admitting to be added into patient's chart. Patient only has Medicare Part B, not Part A that covers SNF. CSW faxed out referral, awaiting on bed responses.    Expected Discharge Plan: Skilled Nursing Facility Barriers to Discharge: Continued Medical Work up, SNF Pending bed offer, SNF Pending transportation, English as a second language teachernsurance Authorization  Expected Discharge Plan and Services Expected Discharge Plan: Skilled Nursing Facility     Post Acute Care Choice: Skilled Nursing Facility Living arrangements for the past 2 months: Single Family Home                                       Social Determinants of Health (SDOH) Interventions    Readmission Risk Interventions No flowsheet data found.

## 2021-08-05 DIAGNOSIS — F03B0 Unspecified dementia, moderate, without behavioral disturbance, psychotic disturbance, mood disturbance, and anxiety: Secondary | ICD-10-CM | POA: Diagnosis not present

## 2021-08-05 DIAGNOSIS — N182 Chronic kidney disease, stage 2 (mild): Secondary | ICD-10-CM | POA: Diagnosis not present

## 2021-08-05 DIAGNOSIS — R410 Disorientation, unspecified: Secondary | ICD-10-CM | POA: Diagnosis not present

## 2021-08-05 DIAGNOSIS — F03B3 Unspecified dementia, moderate, with mood disturbance: Secondary | ICD-10-CM | POA: Diagnosis not present

## 2021-08-05 LAB — BASIC METABOLIC PANEL
Anion gap: 8 (ref 5–15)
BUN: 12 mg/dL (ref 8–23)
CO2: 28 mmol/L (ref 22–32)
Calcium: 8.9 mg/dL (ref 8.9–10.3)
Chloride: 99 mmol/L (ref 98–111)
Creatinine, Ser: 1.15 mg/dL — ABNORMAL HIGH (ref 0.44–1.00)
GFR, Estimated: 53 mL/min — ABNORMAL LOW (ref >60)
Glucose, Bld: 102 mg/dL — ABNORMAL HIGH (ref 70–99)
Potassium: 3.7 mmol/L (ref 3.5–5.1)
Sodium: 135 mmol/L (ref 135–145)

## 2021-08-05 NOTE — Progress Notes (Signed)
Went to see patient at the start of night shift.  Patient was resting comfortably in bed with no complaints.  BP 126/74 (BP Location: Right Arm)    Pulse 67    Temp 97.9 F (36.6 C)    Resp 16    Wt 68.8 kg    SpO2 100%    BMI 24.48 kg/m  General: Elderly female lying in bed in no distress Respiratory: Clear to auscultation bilaterally Cardiac: S1, S2 present with no murmurs  A/P No concerns or complaints.  We will monitor vitals throughout the night.  Remainder per daytime progress note

## 2021-08-05 NOTE — TOC Progression Note (Signed)
Transition of Care [facility](TOC) - Progression Note    Patient Details  Name: Jocelyn Shelton  MRN: [id number] Date of Birth: 07/07/1956  Transition of Care [facility](TOC) CM/SW Contact  Jocelyn Shelton, [place] Phone Number: 08/05/2021, 1:33 PM  Clinical Narrative:   CSW spoke with daughter, Jocelyn Shelton, to discuss [facility]. Daughter in agreement. CSW answered Jocelyn Shelton's questions about what happens after SNF, in terms of setting up DME for home and other services. Daughter appreciative of information. [facility] and [facility] will start insurance authorization process. CSW to follow.    Expected Discharge Plan: Skilled Nursing Facility Barriers to Discharge: Continued Medical Work up, SNF Pending bed offer, SNF Pending transportation, English as a second language teachernsurance Authorization  Expected Discharge Plan and Services Expected Discharge Plan: Skilled Nursing Facility     Post Acute Care Choice: Skilled Nursing Facility Living arrangements for the past 2 months: Single Family Home                                       Social Determinants of Health (SDOH) Interventions    Readmission Risk Interventions No flowsheet data found.

## 2021-08-05 NOTE — Progress Notes (Addendum)
Family Medicine Teaching Service Daily Progress Note Intern Pager: [phone]  Patient name: Jocelyn Shelton Medical record number: [id number] Date of birth: 07-08-1956 Age: 66 y.o. Gender: female  Primary Care Provider: Bess Kinds, MD Consultants: None Code Status: Full  Pt Overview and Major Events to Date:  1/7-admitted for altered mental status   Assessment and Plan:   Jocelyn Shelton is a 66 y.o. female presenting with worsening confusion and electrolyte derrangement. PMH is significant for COPD/emphysema, hypertension, hyperlipidemia, CAD, GERD, MDD. Patient is medically stable and awaiting SNF placement when bed available.   Delirium on dementia (improved) Patient is medically stable and awaiting SNF placement. Patient no longer appears to be delirious, and is at her baseline ability with her dementia.  Hypokalemia S/p 6 rounds IV K repletion. This am K 3.7, improved from yesterday 3.4. -Continue to monitor  CKD II Cr 1.15, up from 1.02 yesterday. GFR 53 -Hold irbesartan  HTN BP well controlled ranging from 101-123/67-80. HR dipping to 59 x 2. -Continue to monitor  All other medical conditions, chronic and stable COPD HFpEF CAD HLD   FEN/GI: Heart Healthy PPx: Lovenox Dispo:SNF  when bed available . Barriers include insurance auth.   Subjective:  Patient with no complaints today, comfortably eating breakfast this morning.   Objective: Temp:  [97.6 F (36.4 C)-98.7 F (37.1 C)] 98.5 F (36.9 C) (01/10 0356) Pulse Rate:  [59-71] 59 (01/10 0356) Resp:  [16-18] 16 (01/10 0356) BP: (101-123)/(64-80) 101/67 (01/10 0356) SpO2:  [100 %] 100 % (01/10 0356) Physical Exam: General: Well appearing, blank staring, NAD, African American female Cardiovascular: RRR, NRMG Respiratory: CTABL Abdomen: Soft, NTTP, Non-distended Extremities: Moving all extremities independently  Laboratory: Recent Labs  Lab 07/31/21 1112 08/01/21 1620 08/02/21 0234 08/02/21 0235   WBC 5.6 4.6 7.8  --   HGB 12.2 13.4 13.4 14.3  HCT 37.3 39.4 41.0 42.0  PLT 154 133* 148*  --    Recent Labs  Lab 07/31/21 1112 07/31/21 1112 08/01/21 1620 08/02/21 0234 08/02/21 0235 08/03/21 0157 08/04/21 0236 08/05/21 0153  NA 145*   < > 135 138   < > 136 133* 135  K 3.3*  --  2.5* 3.2*   < > 3.2* 3.4* 3.7  CL 101  --  97* 95*   < > 97* 95* 99  CO2 29  --  28 32   < > 29 30 28   BUN 7*   < > 5* 9   < > 7* 11 12  CREATININE 1.13*  --  1.05* 1.31*   < > 1.01* 1.02* 1.15*  CALCIUM 9.1  --  8.4* 8.4*   < > 8.6* 8.8* 8.9  PROT 7.0  --  7.3 7.4  --   --   --   --   BILITOT 0.5  --  0.9 0.9  --   --   --   --   ALKPHOS 124*  --  110 107  --   --   --   --   ALT 10  --  12 13  --   --   --   --   AST 20  --  22 22  --   --   --   --   GLUCOSE 95   < > 101* 143*   < > 108* 115* 102*   < > = values in this interval not displayed.      Imaging/Diagnostic Tests:   Bess Kinds, MD  08/05/2021, 6:23 AM PGY-1, [facility] FPTS Intern pager: [phone], text pages welcome

## 2021-08-05 NOTE — Plan of Care (Signed)
Pt has a delayed response when communicating. She does know herself. She did remember her daughter and her name when she was at the bedside.  Problem: Education: Goal: Knowledge of General Education information will improve Description: Including pain rating scale, medication(s)/side effects and non-pharmacologic comfort measures Outcome: Progressing   Problem: Health Behavior/Discharge Planning: Goal: Ability to manage health-related needs will improve Outcome: Progressing   Problem: Clinical Measurements: Goal: Ability to maintain clinical measurements within normal limits will improve Outcome: Progressing Goal: Will remain free from infection Outcome: Progressing Goal: Diagnostic test results will improve Outcome: Progressing Goal: Respiratory complications will improve Outcome: Progressing Goal: Cardiovascular complication will be avoided Outcome: Progressing   Problem: Activity: Goal: Risk for activity intolerance will decrease Outcome: Progressing   Problem: Nutrition: Goal: Adequate nutrition will be maintained Outcome: Progressing   Problem: Coping: Goal: Level of anxiety will decrease Outcome: Progressing   Problem: Elimination: Goal: Will not experience complications related to bowel motility Outcome: Progressing Goal: Will not experience complications related to urinary retention Outcome: Progressing   Problem: Pain Managment: Goal: General experience of comfort will improve Outcome: Progressing   Problem: Safety: Goal: Ability to remain free from injury will improve Outcome: Progressing   Problem: Skin Integrity: Goal: Risk for impaired skin integrity will decrease Outcome: Progressing

## 2021-08-05 NOTE — Plan of Care (Signed)
  Problem: Pain Managment: Goal: General experience of comfort will improve Outcome: Progressing   Problem: Safety: Goal: Ability to remain free from injury will improve Outcome: Progressing   Problem: Skin Integrity: Goal: Risk for impaired skin integrity will decrease Outcome: Progressing   

## 2021-08-06 ENCOUNTER — Other Ambulatory Visit: Payer: Self-pay | Admitting: Student

## 2021-08-06 DIAGNOSIS — F03B0 Unspecified dementia, moderate, without behavioral disturbance, psychotic disturbance, mood disturbance, and anxiety: Secondary | ICD-10-CM | POA: Diagnosis not present

## 2021-08-06 DIAGNOSIS — F03B3 Unspecified dementia, moderate, with mood disturbance: Secondary | ICD-10-CM | POA: Diagnosis not present

## 2021-08-06 DIAGNOSIS — R410 Disorientation, unspecified: Secondary | ICD-10-CM | POA: Diagnosis not present

## 2021-08-06 LAB — RESP PANEL BY RT-PCR (FLU A&B, COVID) ARPGX2
Influenza A by PCR: NEGATIVE
Influenza B by PCR: NEGATIVE
SARS Coronavirus 2 by RT PCR: NEGATIVE

## 2021-08-06 LAB — BASIC METABOLIC PANEL
Anion gap: 10 (ref 5–15)
BUN: 13 mg/dL (ref 8–23)
CO2: 29 mmol/L (ref 22–32)
Calcium: 9.2 mg/dL (ref 8.9–10.3)
Chloride: 96 mmol/L — ABNORMAL LOW (ref 98–111)
Creatinine, Ser: 1.14 mg/dL — ABNORMAL HIGH (ref 0.44–1.00)
GFR, Estimated: 53 mL/min — ABNORMAL LOW (ref >60)
Glucose, Bld: 98 mg/dL (ref 70–99)
Potassium: 4 mmol/L (ref 3.5–5.1)
Sodium: 135 mmol/L (ref 135–145)

## 2021-08-06 NOTE — Progress Notes (Signed)
Occupational Therapy Treatment Patient Details Name: Jocelyn Shelton MRN: [id number] DOB: 10-Sep-1955 Today's Date: 08/06/2021   History of present illness Pt is a 65 y.o. female who presented 08/01/21 with worsening confusion and electrolyte derrangement and difficulty ambulating. MRI of head negative for acute intracranial abnormalities. MRI of lumbar spine negative for acute fxs or cord changes. 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 PMH: COPD, emphysema, HTN, HLD, CAD, GERD, dementia, avascular necrosis of bil humeral heads, MI, Raynauds syndrome, scleroderma, RA   OT comments  Jocelyn Shelton is progressing well towards her acute goals. She followed all simple commands well, however would only state "yes/no" to direct questions with increased time. Pt required mod A for bed mobility and sit<>Stand transfers, however once standing and ambulating with a SPC pt transitioned to min A-min guard. She tolerated ~8 minutes of standing at the sink while grooming and completing LB bathing. Pt continues to require cues for sequencing through all functional tasks. D/c plan remains appropriate, OT to continue to follow acutely.    Recommendations for follow up therapy are one component of a multi-disciplinary discharge planning process, led by the attending physician.  Recommendations may be updated based on patient status, additional functional criteria and insurance authorization.    Follow Up Recommendations  Skilled nursing-short term rehab (<3 hours/day)    Assistance Recommended at Discharge Frequent or constant Supervision/Assistance  Patient can return home with the following  Assistance with cooking/housework;Direct supervision/assist for medications management;Direct supervision/assist for financial management;Assist for transportation;Help with stairs or ramp for entrance;A little help with bathing/dressing/bathroom   Equipment Recommendations  None recommended by OT       Precautions / Restrictions  Precautions Precautions: Fall Restrictions Weight Bearing Restrictions: No       Mobility Bed Mobility Overal bed mobility: Needs Assistance Bed Mobility: Supine to Sit     Supine to sit: Mod assist     General bed mobility comments: mod A to advance hips towards EOB and verbal cues to sequence    Transfers Overall transfer level: Needs assistance Equipment used: Straight cane Transfers: Sit to/from Stand Sit to Stand: Mod assist           General transfer comment: posterior bias, resting BLE on bed/chair upon standing. Mod A required to power into standing. Pt transitioned to min guard once standing     Balance Overall balance assessment: Needs assistance Sitting-balance support: No upper extremity supported;Feet supported Sitting balance-Leahy Scale: Fair     Standing balance support: During functional activity;No upper extremity supported Standing balance-Leahy Scale: Fair Standing balance comment: kyphotic posture while standing at the sink - close min guard fro safety             ADL either performed or assessed with clinical judgement   ADL Overall ADL's : Needs assistance/impaired     Grooming: Min guard;Minimal assistance;Standing Grooming Details (indicate cue type and reason): min guard for standing balance at the sink, min A for verbal cues to sequence through the task     Lower Body Bathing: Minimal assistance;Min guard;Sit to/from stand Lower Body Bathing Details (indicate cue type and reason): min G for safety in standing. min A for verbal cues. Pt bathed her peri area in standing                     Functional mobility during ADLs: Min guard;Cane;+2 for safety/equipment General ADL Comments: Pt ambulated wtih SPC to sink to groom and bathe peri area. Pt stood at the sink for ~  8 minutes wiithout siting rest break. She continues to required verbal cues to sequence all tasks.    Extremity/Trunk Assessment Upper Extremity  Assessment Upper Extremity Assessment: Generalized weakness (using BUE functionally for grooming and LB bahting at the sink)   Lower Extremity Assessment Lower Extremity Assessment: Defer to PT evaluation        Vision   Vision Assessment?: Vision impaired- to be further tested in functional context Additional Comments: R gaze throughout, required cues for midline head posture and to locate items in L environment   Perception Perception Perception: Not tested   Praxis Praxis Praxis: Not tested    Cognition Arousal/Alertness: Awake/alert Behavior During Therapy: Flat affect Overall Cognitive Status: History of cognitive impairments - at baseline         General Comments: Pt stating "yes/no" throughout session when asked simple direct question and given incrased time for processing. required cues to locate items in L visual field. Cues for sequencing funciotnal grooming at the sink                General Comments VSS on RA, pt denies acute signs or symptoms    Pertinent Vitals/ Pain       Pain Assessment: No/denies pain Breathing: normal Pain Intervention(s): Monitored during session  Home Living Family/patient expects to be discharged to:: Private residence                    Frequency  Min 2X/week        Progress Toward Goals  OT Goals(current goals can now be found in the care plan section)  Progress towards OT goals: Progressing toward goals  Acute Rehab OT Goals OT Goal Formulation: Patient unable to participate in goal setting Time For Goal Achievement: 08/16/21 Potential to Achieve Goals: Fair ADL Goals Pt Will Perform Grooming: with supervision;standing Pt Will Perform Lower Body Bathing: with supervision;sit to/from stand Pt Will Perform Lower Body Dressing: sit to/from stand;with supervision Pt Will Transfer to Toilet: with supervision;ambulating;regular height toilet Pt Will Perform Toileting - Clothing Manipulation and hygiene: with  supervision;sitting/lateral leans  Plan Discharge plan remains appropriate    Co-evaluation    PT/OT/SLP Co-Evaluation/Treatment: Yes Reason for Co-Treatment: For patient/therapist safety;Necessary to address cognition/behavior during functional activity   OT goals addressed during session: ADL's and self-care      AM-PAC OT "6 Clicks" Daily Activity     Outcome Measure   Help from another person eating meals?: A Little Help from another person taking care of personal grooming?: A Little Help from another person toileting, which includes using toliet, bedpan, or urinal?: A Little Help from another person bathing (including washing, rinsing, drying)?: A Little Help from another person to put on and taking off regular upper body clothing?: A Little Help from another person to put on and taking off regular lower body clothing?: A Lot 6 Click Score: 17    End of Session Equipment Utilized During Treatment: Gait belt (SPC)  OT Visit Diagnosis: Unsteadiness on feet (R26.81);Other symptoms and signs involving cognitive function   Activity Tolerance Patient tolerated treatment well   Patient Left in chair;with call bell/phone within reach;with chair alarm set   Nurse Communication Mobility status        Time: 1402-1430 OT Time Calculation (min): 28 min  Charges: OT General Charges $OT Visit: 1 Visit OT Treatments $Self Care/Home Management : 8-22 mins    Basheer Molchan A Neve Branscomb 08/06/2021, 2:49 PM

## 2021-08-06 NOTE — Progress Notes (Signed)
Family Medicine Teaching Service Daily Progress Note Intern Pager: [phone]  Patient name: Jocelyn Shelton  Medical record number: [id number] Date of birth: 01/05/1956 Age: 66 y.o. Gender: female  Primary Care Provider: Bess KindsSowell, Sheva Mcdougle, MD Consultants: Npne Code Status: Full  Pt Overview and Major Events to Date:  1/7-admitted for altered mental status  Assessment and Plan:   Jocelyn Shelton  is a 66 y.o. female presenting with worsening confusion and electrolyte derrangement. PMH is significant for COPD/emphysema, hypertension, hyperlipidemia, CAD, GERD, MDD. Patient is medically stable and awaiting SNF placement when bed available.   Delirium on dementia (improved) Patient is medically stable and awaiting SNF placement. Patient no longer appears to be delirious and appears to be at her baseline with her demential.  Hypokalemia (resolved) K elevated at 4.0 today (3.7). -Continue to monitor  CKD II GFR stable at 53 today. Cr stable at 1.14 (1.15). Held irbesartan yesterday -Continue to monitor  HTN BP stable overnight. Range 120-140's/70-80's -Continue to monitor  All other medical conditions chronic and stable COPD HPpEF CAD HLD  FEN/GI: Heart Healthy PPx: Lovenox Dispo:SNF today. Barriers include None.   Subjective:  Patient in no acute distress, sitting comfortably in bed. Patient alert and oriented to self. Patient taking extended time to respond to questions, and needing to be asked multiple times.  Objective: Temp:  [97.9 F (36.6 C)-98.4 F (36.9 C)] 98 F (36.7 C) (01/11 0336) Pulse Rate:  [56-81] 64 (01/11 0336) Resp:  [16-19] 16 (01/11 0336) BP: (105-143)/(64-82) 137/73 (01/11 0336) SpO2:  [97 %-100 %] 100 % (01/11 0336) Physical Exam: General: Well appearing, sitting comfortably in bed, NAD, African American woman Cardiovascular: RRR, NRMG Respiratory: CTABL Abdomen: Soft, NTTP, non-distended Extremities: Moving all extremities independently. Cap  refill < 2 sec  Laboratory: Recent Labs  Lab 07/31/21 1112 08/01/21 1620 08/02/21 0234 08/02/21 0235  WBC 5.6 4.6 7.8  --   HGB 12.2 13.4 13.4 14.3  HCT 37.3 39.4 41.0 42.0  PLT 154 133* 148*  --    Recent Labs  Lab 07/31/21 1112 07/31/21 1112 08/01/21 1620 08/02/21 0234 08/02/21 0235 08/04/21 0236 08/05/21 0153 08/06/21 0421  NA 145*   < > 135 138   < > 133* 135 135  K 3.3*  --  2.5* 3.2*   < > 3.4* 3.7 4.0  CL 101  --  97* 95*   < > 95* 99 96*  CO2 29  --  28 32   < > 30 28 29   BUN 7*   < > 5* 9   < > 11 12 13   CREATININE 1.13*  --  1.05* 1.31*   < > 1.02* 1.15* 1.14*  CALCIUM 9.1  --  8.4* 8.4*   < > 8.8* 8.9 9.2  PROT 7.0  --  7.3 7.4  --   --   --   --   BILITOT 0.5  --  0.9 0.9  --   --   --   --   ALKPHOS 124*  --  110 107  --   --   --   --   ALT 10  --  12 13  --   --   --   --   AST 20  --  22 22  --   --   --   --   GLUCOSE 95   < > 101* 143*   < > 115* 102* 98   < > = values in this interval not displayed.  Imaging/Diagnostic Tests:   Bess Kinds, MD 08/06/2021, 5:52 AM PGY-1, [facility] Family Medicine FPTS Intern pager: [phone], text pages welcome

## 2021-08-06 NOTE — Progress Notes (Signed)
Physical Therapy Treatment Patient Details Name: Jocelyn BogusBirdie Shelton  MRN: [id number] DOB: 04/24/1956 Today's Date: 08/06/2021   History of Present Illness Pt is a 66 y.o. female who presented 08/01/21 with worsening confusion and electrolyte derrangement and difficulty ambulating. MRI of head negative for acute intracranial abnormalities. MRI of lumbar spine negative for acute fxs or cord changes. 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 PMH: COPD, emphysema, HTN, HLD, CAD, GERD, dementia, avascular necrosis of bil humeral heads, MI, Raynauds syndrome, scleroderma, RA    PT Comments    Pt received in supine, agreeable to therapy session with increased time/encouragement for initiation and command following. Pt needing modA for transfers and up to +2 minA for gait using cane and HHA. Pt tolerated increased time standing, ~8 mins at sink with 1-2 UE support and did well with goal-directed cues for gait progression in room. Pt continues to benefit from PT services to progress toward functional mobility goals.    Recommendations for follow up therapy are one component of a multi-disciplinary discharge planning process, led by the attending physician.  Recommendations may be updated based on patient status, additional functional criteria and insurance authorization.  Follow Up Recommendations  Skilled nursing-short term rehab (<3 hours/day)     Assistance Recommended at Discharge Frequent or constant Supervision/Assistance  Patient can return home with the following A little help with walking and/or transfers;A lot of help with bathing/dressing/bathroom;Assistance with cooking/housework;Direct supervision/assist for medications management;Direct supervision/assist for financial management;Assist for transportation;Help with stairs or ramp for entrance   Equipment Recommendations  Rolling walker (2 wheels);Hospital bed;Wheelchair (measurements PT);Wheelchair cushion (measurements PT) (pending progression)     Recommendations for Other Services       Precautions / Restrictions Precautions Precautions: Fall Restrictions Weight Bearing Restrictions: No     Mobility  Bed Mobility Overal bed mobility: Needs Assistance Bed Mobility: Supine to Sit     Supine to sit: Mod assist     General bed mobility comments: mod A to advance hips towards EOB and verbal cues to sequence, slow processing    Transfers Overall transfer level: Needs assistance Equipment used: Straight cane Transfers: Sit to/from Stand Sit to Stand: Mod assist;+2 safety/equipment           General transfer comment: posterior bias, resting BLE on bed/chair upon standing. Mod A required to power into standing. Pt transitioned to min guard once standing    Ambulation/Gait Ambulation/Gait assistance: Min assist;Min guard;+2 safety/equipment Gait Distance (Feet): 35 Feet Assistive device: Straight cane;1 person hand held assist Gait Pattern/deviations: Step-to pattern;Decreased stride length;Shuffle;Narrow base of support Gait velocity: reduced     General Gait Details: MinA to min guard to steady and cue for navigation and initiation/advancement of steps. She seems to do better with goal-directed cues i.e. "walk to the sink". Pt at times using only cane then at other times reaching for furniture and steadier with LUE HHA along with RUE using cane   Stairs             Wheelchair Mobility    Modified Rankin (Stroke Patients Only) Modified Rankin (Stroke Patients Only) Pre-Morbid Rankin Score: Moderate disability Modified Rankin: Moderately severe disability     Balance Overall balance assessment: Needs assistance Sitting-balance support: No upper extremity supported;Feet supported Sitting balance-Leahy Scale: Fair     Standing balance support: During functional activity;No upper extremity supported Standing balance-Leahy Scale: Fair Standing balance comment: kyphotic posture while standing at  the sink - close min guard for safety  Cognition Arousal/Alertness: Awake/alert Behavior During Therapy: Flat affect Overall Cognitive Status: History of cognitive impairments - at baseline                                 General Comments: Pt stating "yes/no" throughout session when asked simple direct question and given incrased time for processing. required cues to locate items in Lt visual field. Pt tolerated >10 mins standing/ambulatory tasks at a time prior to seated break.        Exercises      General Comments General comments (skin integrity, edema, etc.): VSS on RA, no acute s/sx distress      Pertinent Vitals/Pain Pain Assessment: No/denies pain Breathing: normal Pain Intervention(s): Monitored during session;Repositioned    Home Living Family/patient expects to be discharged to:: Private residence                        Prior Function            PT Goals (current goals can now be found in the care plan section) Acute Rehab PT Goals Patient Stated Goal: did not state; daughter agreeable to SNF PT Goal Formulation: With patient/family Time For Goal Achievement: 08/16/21 Progress towards PT goals: Progressing toward goals    Frequency    Min 2X/week      PT Plan Current plan remains appropriate    Co-evaluation PT/OT/SLP Co-Evaluation/Treatment: Yes Reason for Co-Treatment: Necessary to address cognition/behavior during functional activity;For patient/therapist safety;To address functional/ADL transfers PT goals addressed during session: Mobility/safety with mobility;Balance;Proper use of DME;Strengthening/ROM OT goals addressed during session: ADL's and self-care      AM-PAC PT "6 Clicks" Mobility   Outcome Measure  Help needed turning from your back to your side while in a flat bed without using bedrails?: A Little Help needed moving from lying on your back to sitting on the side of a  flat bed without using bedrails?: A Lot (mod cues) Help needed moving to and from a bed to a chair (including a wheelchair)?: A Little Help needed standing up from a chair using your arms (e.g., wheelchair or bedside chair)?: A Lot Help needed to walk in hospital room?: A Lot (mod cues) Help needed climbing 3-5 steps with a railing? : Total 6 Click Score: 13    End of Session Equipment Utilized During Treatment: Gait belt Activity Tolerance: Patient tolerated treatment well Patient left: with call bell/phone within reach;in chair;with chair alarm set Nurse Communication: Mobility status PT Visit Diagnosis: Unsteadiness on feet (R26.81);Other abnormalities of gait and mobility (R26.89);Muscle weakness (generalized) (M62.81);Difficulty in walking, not elsewhere classified (R26.2)     Time: 7616-0737 PT Time Calculation (min) (ACUTE ONLY): 26 min  Charges:  $Gait Training: 8-22 mins                     Jocelyn Flinn P., PTA Acute Rehabilitation Services Pager: [phone] Office: [phone]    Angus Palms 08/06/2021, 3:23 PM

## 2021-08-07 ENCOUNTER — Ambulatory Visit: Payer: Medicare Other | Admitting: Physician Assistant

## 2021-08-07 DIAGNOSIS — R4182 Altered mental status, unspecified: Secondary | ICD-10-CM | POA: Diagnosis not present

## 2021-08-07 NOTE — Progress Notes (Signed)
FPTS Brief Note Reviewed patient's vitals, recent notes.  Vitals:   08/07/21 1620 08/07/21 2034  BP: 123/88 122/89  Pulse: 60 87  Resp: 16 17  Temp: 98.4 F (36.9 C) 98.2 F (36.8 C)  SpO2: 100% 99%   At this time, no change in plan from day progress note.  Levin Erp, MD Page [phone] with questions about this patient.

## 2021-08-07 NOTE — Progress Notes (Addendum)
Family Medicine Teaching Service Daily Progress Note Intern Pager: [phone]  Patient name: Jocelyn Shelton M  Medical record number: [id number] Date of birth: 11/05/1955 Age: 66 y.o. Gender: female  Primary Care Provider: Bess KindsSowell, Lanier Felty, MD Consultants: None Code Status: Full  Pt Overview and Major Events to Date:  1/7-admitted for altered mental status  Assessment and Plan:   Jocelyn Shelton  is a 66 y.o. female presenting with worsening confusion and electrolyte derrangement. PMH is significant for COPD/emphysema, hypertension, hyperlipidemia, CAD, GERD, MDD and dementia. Patient is medically stable and awaiting SNF placement when bed available.   Delirium on dementia (improved) Patient medically stable and awaiting insurance authorization for SNF placement. Patient appears to be at her baseline level, with her dementia.  CKD II GFR and Cr stable yesterday. Will check Saturday  -Continue to monitor -Continue to hold irbesartan  HTN BP stable overnight. Range 130's-140/70-90's -Continue to monitor   All other medical conditions chronic and stable COPD HPpEF CAD HLD  FEN/GI: [facility] Healthy PPx: Lovenox Dispo:SNF today. Barriers include insurance auth.   Subjective:  [name], patient sitting comfortably in bed.   Objective: Temp:  [97.9 F (36.6 C)-98.5 F (36.9 C)] 97.9 F (36.6 C) (01/12 0411) Pulse Rate:  [62-72] 62 (01/12 0411) Resp:  [15-17] 16 (01/12 0411) BP: (121-140)/(73-91) 133/78 (01/12 0411) SpO2:  [98 %-100 %] 100 % (01/12 0411) Physical Exam: General: Alert and oriented to self, elderly, frail, NAD, African American woman Cardiovascular: RRR, NRMG Respiratory: CTABL Abdomen: Soft, NTTP, non-distended Extremities: Moving all extremities independently, no edema  Laboratory: Recent Labs  Lab 07/31/21 1112 08/01/21 1620 08/02/21 0234 08/02/21 0235  WBC 5.6 4.6 7.8  --   HGB 12.2 13.4 13.4 14.3  HCT 37.3 39.4 41.0 42.0  PLT 154 133* 148*  --     Recent Labs  Lab 07/31/21 1112 07/31/21 1112 08/01/21 1620 08/02/21 0234 08/02/21 0235 08/04/21 0236 08/05/21 0153 08/06/21 0421  NA 145*   < > 135 138   < > 133* 135 135  K 3.3*  --  2.5* 3.2*   < > 3.4* 3.7 4.0  CL 101  --  97* 95*   < > 95* 99 96*  CO2 29  --  28 32   < > 30 28 29   BUN 7*   < > 5* 9   < > 11 12 13   CREATININE 1.13*  --  1.05* 1.31*   < > 1.02* 1.15* 1.14*  CALCIUM 9.1  --  8.4* 8.4*   < > 8.8* 8.9 9.2  PROT 7.0  --  7.3 7.4  --   --   --   --   BILITOT 0.5  --  0.9 0.9  --   --   --   --   ALKPHOS 124*  --  110 107  --   --   --   --   ALT 10  --  12 13  --   --   --   --   AST 20  --  22 22  --   --   --   --   GLUCOSE 95   < > 101* 143*   < > 115* 102* 98   < > = values in this interval not displayed.      Imaging/Diagnostic Tests:   Bess KindsSowell, Sherly Brodbeck, MD 08/07/2021, 7:32 AM PGY-1, [facility] FPTS Intern pager: [phone], text pages welcome

## 2021-08-07 NOTE — Plan of Care (Signed)

## 2021-08-08 ENCOUNTER — Other Ambulatory Visit: Payer: Self-pay | Admitting: Student

## 2021-08-08 DIAGNOSIS — R4182 Altered mental status, unspecified: Secondary | ICD-10-CM | POA: Diagnosis not present

## 2021-08-08 DIAGNOSIS — R410 Disorientation, unspecified: Secondary | ICD-10-CM | POA: Diagnosis not present

## 2021-08-08 DIAGNOSIS — F03B3 Unspecified dementia, moderate, with mood disturbance: Secondary | ICD-10-CM | POA: Diagnosis not present

## 2021-08-08 NOTE — Progress Notes (Signed)
Occupational Therapy Treatment Patient Details Name: Jocelyn Shelton MRN: [id number] DOB: 08/07/1955 Today's Date: 08/08/2021   History of present illness Pt is a 66 y.o. female who presented 08/01/21 with worsening confusion and electrolyte derrangement and difficulty ambulating. MRI of head negative for acute intracranial abnormalities. MRI of lumbar spine negative for acute fxs or cord changes. 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 PMH: COPD, emphysema, HTN, HLD, CAD, GERD, dementia, avascular necrosis of bil humeral heads, MI, Raynauds syndrome, scleroderma, RA   OT comments  Patient received in bed and agreeable to PT/OT session. Patient was min assist to get to EOB and verbal cues.  Patient donned mesh underwear while seated on EOB and stood to pull up.  Patient performed functional mobility wit hRW and assist of 2 for safety.  Patient easily distracted and requires frequent cues for safety and correct walker use. Patient was able to perform grooming tasks standing at sink with min guard and verbal cues.  Acute OT to continue to follow.     Recommendations for follow up therapy are one component of a multi-disciplinary discharge planning process, led by the attending physician.  Recommendations may be updated based on patient status, additional functional criteria and insurance authorization.    Follow Up Recommendations  Skilled nursing-short term rehab (<3 hours/day)    Assistance Recommended at Discharge Frequent or constant Supervision/Assistance  Patient can return home with the following  Assistance with cooking/housework;Direct supervision/assist for medications management;Direct supervision/assist for financial management;Assist for transportation;Help with stairs or ramp for entrance;A little help with bathing/dressing/bathroom   Equipment Recommendations  None recommended by OT    Recommendations for Other Services      Precautions / Restrictions Precautions Precautions:  Fall Restrictions Weight Bearing Restrictions: No       Mobility Bed Mobility Overal bed mobility: Needs Assistance Bed Mobility: Supine to Sit     Supine to sit: Min assist     General bed mobility comments: verbal cues for technique and rail use    Transfers Overall transfer level: Needs assistance Equipment used: Rolling walker (2 wheels) Transfers: Sit to/from Stand;Bed to chair/wheelchair/BSC Sit to Stand: Min assist;Min guard   Step pivot transfers: Min assist;Min guard       General transfer comment: min assist of one and min guard of another for RW use and balance for transfers     Balance Overall balance assessment: Needs assistance Sitting-balance support: No upper extremity supported;Feet supported Sitting balance-Leahy Scale: Fair Sitting balance - Comments: Static sitting EOB with min guard-supervision for safety, no LOB.   Standing balance support: During functional activity;No upper extremity supported Standing balance-Leahy Scale: Fair Standing balance comment: stood at sink with min guard                           ADL either performed or assessed with clinical judgement   ADL Overall ADL's : Needs assistance/impaired     Grooming: Min guard;Standing;Wash/dry hands;Wash/dry face Grooming Details (indicate cue type and reason): stood at sink with min guard for balance             Lower Body Dressing: Minimal assistance;Sitting/lateral leans;Sit to/from stand Lower Body Dressing Details (indicate cue type and reason): required min assist to thread legs into mesh underwear and assistance to pull up               General ADL Comments: patient seen for grooming standing at sink and donned mesh underwear  Extremity/Trunk Assessment              Vision       Perception     Praxis      Cognition Arousal/Alertness: Awake/alert Behavior During Therapy: Flat affect Overall Cognitive Status: History of cognitive  impairments - at baseline                                 General Comments: respond with yes and no answers.  Frequent cues to follow directions and often needs repeated commands          Exercises     Shoulder Instructions       General Comments      Pertinent Vitals/ Pain       Faces Pain Scale: No hurt  Home Living                                          Prior Functioning/Environment              Frequency  Min 2X/week        Progress Toward Goals  OT Goals(current goals can now be found in the care plan section)  Progress towards OT goals: Progressing toward goals  Acute Rehab OT Goals OT Goal Formulation: Patient unable to participate in goal setting Time For Goal Achievement: 08/16/21 Potential to Achieve Goals: Fair ADL Goals Pt Will Perform Grooming: with supervision;standing Pt Will Perform Lower Body Bathing: with supervision;sit to/from stand Pt Will Perform Lower Body Dressing: sit to/from stand;with supervision Pt Will Transfer to Toilet: with supervision;ambulating;regular height toilet Pt Will Perform Toileting - Clothing Manipulation and hygiene: with supervision;sitting/lateral leans  Plan Discharge plan remains appropriate    Co-evaluation    PT/OT/SLP Co-Evaluation/Treatment: Yes Reason for Co-Treatment: For patient/therapist safety;To address functional/ADL transfers   OT goals addressed during session: ADL's and self-care      AM-PAC OT "6 Clicks" Daily Activity     Outcome Measure   Help from another person eating meals?: A Little Help from another person taking care of personal grooming?: A Little Help from another person toileting, which includes using toliet, bedpan, or urinal?: A Little Help from another person bathing (including washing, rinsing, drying)?: A Little Help from another person to put on and taking off regular upper body clothing?: A Little Help from another person to put on  and taking off regular lower body clothing?: A Lot 6 Click Score: 17    End of Session Equipment Utilized During Treatment: Gait belt;Rolling walker (2 wheels)  OT Visit Diagnosis: Unsteadiness on feet (R26.81);Other symptoms and signs involving cognitive function   Activity Tolerance Patient tolerated treatment well   Patient Left in chair;with call bell/phone within reach;with chair alarm set   Nurse Communication Mobility status        Time: 1610-9604 OT Time Calculation (min): 29 min  Charges: OT General Charges $OT Visit: 1 Visit OT Treatments $Self Care/Home Management : 8-22 mins  Alfonse Flavors, OTA Acute Rehabilitation Services  Pager [phone] Office [phone]   Dewain Penning 08/08/2021, 1:12 PM

## 2021-08-08 NOTE — TOC Progression Note (Signed)
Transition of Care [facility](TOC) - Progression Note    Patient Details  Name: Hale BogusBirdie M  MRN: [id number] Date of Birth: 09/17/1955  Transition of Care [facility](TOC) CM/SW Contact  Carley HammedJessica  Torina Ey, [place] Phone Number: 08/08/2021, 12:40 PM  Clinical Narrative:    [facility] and [facility]. CSW was advised that authorization had not been approved yet, but facility will contact TOC when it has been. Medical team updated, TOC will continue to follow.   Expected Discharge Plan: Skilled Nursing Facility Barriers to Discharge: Continued Medical Work up, SNF Pending bed offer, SNF Pending transportation, English as a second language teachernsurance Authorization  Expected Discharge Plan and Services Expected Discharge Plan: Skilled Nursing Facility     Post Acute Care Choice: Skilled Nursing Facility Living arrangements for the past 2 months: Single Family Home                                       Social Determinants of Health (SDOH) Interventions    Readmission Risk Interventions No flowsheet data found.

## 2021-08-08 NOTE — Plan of Care (Signed)

## 2021-08-08 NOTE — Progress Notes (Signed)
Family Medicine Teaching Service Daily Progress Note Intern Pager: [phone]  Patient name: Jocelyn Shelton M  Medical record number: [id number] Date of birth: 11/18/1955 Age: 66 y.o. Gender: female  Primary Care Provider: Bess KindsSowell, Fredi Geiler, MD Consultants: None Code Status: Full  Pt Overview and Major Events to Date:  1/7-admitted for altered mental status   Assessment and Plan:    Jocelyn Shelton  is a 66 y.o. female presenting with worsening confusion and electrolyte derrangement. PMH is significant for COPD/emphysema, hypertension, hyperlipidemia, CAD, GERD, MDD and dementia. Patient is medically stable and awaiting SNF placement when bed available.  Delirium on dementia (improved) Patient mental status stable today. Patient alert and oriented to self and place. Patient continues to have long delay's/need repeat questions, before answering questions. Patient can say name and birth date, but otherwise responds with 'yes' and 'no' answers. Patient is likely at her baseline mental status with her dementia. Patient medically stable and awaiting insurance authorization for SNF placement. -Consider palliative care consult  CKD II Stable, holding irbesartan, will re-check Saturday  HTN BP stable, range 120-130's/60-90's -Continue to monitor  All other medical conditions chronic and stable COPD HPpEF CAD HLD    FEN/GI: Heart Healthy PPx: Lovenox Dispo:SNF today. Barriers include insurance auth.   Subjective:  Patient with no complaints this morning, resting in bed comfortably, eating breakfast.   Objective: Temp:  [97.7 F (36.5 C)-98.6 F (37 C)] 98.2 F (36.8 C) (01/13 0303) Pulse Rate:  [60-87] 61 (01/13 0303) Resp:  [15-18] 17 (01/13 0303) BP: (121-133)/(68-99) 133/73 (01/13 0303) SpO2:  [99 %-100 %] 100 % (01/13 0303) Physical Exam: General: Well appearing, poorly interactive, NAD, African American woman Cardiovascular: RRR, NRMG Respiratory: CTABL Abdomen: Soft, NTTP,  non-distended Extremities: Moving all extremities independently, no edema  Laboratory: Recent Labs  Lab 08/01/21 1620 08/02/21 0234 08/02/21 0235  WBC 4.6 7.8  --   HGB 13.4 13.4 14.3  HCT 39.4 41.0 42.0  PLT 133* 148*  --    Recent Labs  Lab 08/01/21 1620 08/02/21 0234 08/02/21 0235 08/04/21 0236 08/05/21 0153 08/06/21 0421  NA 135 138   < > 133* 135 135  K 2.5* 3.2*   < > 3.4* 3.7 4.0  CL 97* 95*   < > 95* 99 96*  CO2 28 32   < > 30 28 29   BUN 5* 9   < > 11 12 13   CREATININE 1.05* 1.31*   < > 1.02* 1.15* 1.14*  CALCIUM 8.4* 8.4*   < > 8.8* 8.9 9.2  PROT 7.3 7.4  --   --   --   --   BILITOT 0.9 0.9  --   --   --   --   ALKPHOS 110 107  --   --   --   --   ALT 12 13  --   --   --   --   AST 22 22  --   --   --   --   GLUCOSE 101* 143*   < > 115* 102* 98   < > = values in this interval not displayed.      Imaging/Diagnostic Tests:   Bess KindsSowell, Geraldean Walen, MD 08/08/2021, 5:41 AM PGY-1, [facility] FPTS Intern pager: [phone], text pages welcome

## 2021-08-08 NOTE — Progress Notes (Signed)
Physical Therapy Treatment Patient Details Name: Jocelyn Shelton  MRN: [id number] DOB: 02/21/1956 Today's Date: 08/08/2021   History of Present Illness Pt is a 66 y.o. female who presented 08/01/21 with worsening confusion and electrolyte derrangement and difficulty ambulating. MRI of head negative for acute intracranial abnormalities. MRI of lumbar spine negative for acute fxs or cord changes. 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 PMH: COPD, emphysema, HTN, HLD, CAD, GERD, dementia, avascular necrosis of bil humeral heads, MI, Raynauds syndrome, scleroderma, RA    PT Comments    Pt received in supine, agreeable to therapy session with increased time and with good participation and following of simple 1-step commands. Pt needing up to minA for transfers and gait with RW/HHA and +2 for safety. Pt tolerated >15 minutes standing tasks today prior to needing a seated break. Pt continues to benefit from PT services to progress toward functional mobility goals.    Recommendations for follow up therapy are one component of a multi-disciplinary discharge planning process, led by the attending physician.  Recommendations may be updated based on patient status, additional functional criteria and insurance authorization.  Follow Up Recommendations  Skilled nursing-short term rehab (<3 hours/day)     Assistance Recommended at Discharge Frequent or constant Supervision/Assistance  Patient can return home with the following A little help with walking and/or transfers;A lot of help with bathing/dressing/bathroom;Assistance with cooking/housework;Direct supervision/assist for medications management;Direct supervision/assist for financial management;Assist for transportation;Help with stairs or ramp for entrance   Equipment Recommendations  Rolling walker (2 wheels);Hospital bed;Wheelchair (measurements PT);Wheelchair cushion (measurements PT) (pending progression)    Recommendations for Other Services        Precautions / Restrictions Precautions Precautions: Fall Restrictions Weight Bearing Restrictions: No     Mobility  Bed Mobility Overal bed mobility: Needs Assistance Bed Mobility: Supine to Sit     Supine to sit: Min assist     General bed mobility comments: verbal cues for technique and rail use    Transfers Overall transfer level: Needs assistance Equipment used: Rolling walker (2 wheels) Transfers: Sit to/from Stand;Bed to chair/wheelchair/BSC Sit to Stand: Min assist;Min guard     Step pivot transfers: Min assist;Min guard     General transfer comment: min assist of one and min guard of another for RW use and balance for transfers    Ambulation/Gait Ambulation/Gait assistance: Min assist;Min guard Gait Distance (Feet): 75 Feet (60 with RW, 315ft with HHA +2) Assistive device: 2 person hand held assist;Rolling walker (2 wheels) Gait Pattern/deviations: Step-to pattern;Decreased stride length;Shuffle;Narrow base of support       General Gait Details: With HHA pt needing up to +2 minA for short distance ~7215ft. Pt tending to veer toward Rt side while using RW and needing minA to correct direction, at times min guard with RW briefly.   Modified Rankin (Stroke Patients Only) Modified Rankin (Stroke Patients Only) Pre-Morbid Rankin Score: Moderate disability Modified Rankin: Moderately severe disability     Balance Overall balance assessment: Needs assistance Sitting-balance support: No upper extremity supported;Feet supported Sitting balance-Leahy Scale: Fair Sitting balance - Comments: Static sitting EOB with min guard-supervision for safety, no LOB.   Standing balance support: During functional activity;No upper extremity supported Standing balance-Leahy Scale: Fair Standing balance comment: stood at sink with min guard            Cognition Arousal/Alertness: Awake/alert Behavior During Therapy: Flat affect Overall Cognitive Status: History of  cognitive impairments - at baseline     General Comments: respond with yes and no  answers.  Frequent cues to follow directions and often needs repeated commands, pt easily distracted in hallway.        Exercises      General Comments General comments (skin integrity, edema, etc.): VSS per chart review, no acute s/sx distress      Pertinent Vitals/Pain Pain Assessment: Faces Faces Pain Scale: No hurt Pain Intervention(s): Monitored during session;Repositioned     PT Goals (current goals can now be found in the care plan section) Acute Rehab PT Goals Patient Stated Goal: did not state; daughter agreeable to SNF PT Goal Formulation: With patient/family Time For Goal Achievement: 08/16/21 Progress towards PT goals: Progressing toward goals    Frequency    Min 2X/week      PT Plan Current plan remains appropriate    Co-evaluation PT/OT/SLP Co-Evaluation/Treatment: Yes Reason for Co-Treatment: Necessary to address cognition/behavior during functional activity;For patient/therapist safety;To address functional/ADL transfers PT goals addressed during session: Mobility/safety with mobility;Balance;Proper use of DME OT goals addressed during session: ADL's and self-care      AM-PAC PT "6 Clicks" Mobility   Outcome Measure  Help needed turning from your back to your side while in a flat bed without using bedrails?: A Little Help needed moving from lying on your back to sitting on the side of a flat bed without using bedrails?: A Little Help needed moving to and from a bed to a chair (including a wheelchair)?: A Lot (mod cues) Help needed standing up from a chair using your arms (e.g., wheelchair or bedside chair)?: A Lot (mod cues) Help needed to walk in hospital room?: A Lot (mod cues) Help needed climbing 3-5 steps with a railing? : A Lot 6 Click Score: 14    End of Session Equipment Utilized During Treatment: Gait belt Activity Tolerance: Patient tolerated treatment  well Patient left: with call bell/phone within reach;in chair;with chair alarm set Nurse Communication: Mobility status PT Visit Diagnosis: Unsteadiness on feet (R26.81);Other abnormalities of gait and mobility (R26.89);Muscle weakness (generalized) (M62.81);Difficulty in walking, not elsewhere classified (R26.2)     Time: 0240-9735 PT Time Calculation (min) (ACUTE ONLY): 30 min  Charges:  $Gait Training: 8-22 mins                     Alfredia Desanctis P., PTA Acute Rehabilitation Services Pager: [phone] Office: [phone]    Angus Palms 08/08/2021, 3:35 PM

## 2021-08-09 DIAGNOSIS — G9340 Encephalopathy, unspecified: Secondary | ICD-10-CM | POA: Diagnosis not present

## 2021-08-09 DIAGNOSIS — E876 Hypokalemia: Secondary | ICD-10-CM | POA: Diagnosis not present

## 2021-08-09 DIAGNOSIS — F0390 Unspecified dementia without behavioral disturbance: Secondary | ICD-10-CM | POA: Diagnosis not present

## 2021-08-09 DIAGNOSIS — Z7189 Other specified counseling: Secondary | ICD-10-CM

## 2021-08-09 DIAGNOSIS — R410 Disorientation, unspecified: Secondary | ICD-10-CM | POA: Diagnosis not present

## 2021-08-09 DIAGNOSIS — N182 Chronic kidney disease, stage 2 (mild): Secondary | ICD-10-CM | POA: Diagnosis not present

## 2021-08-09 DIAGNOSIS — R4182 Altered mental status, unspecified: Secondary | ICD-10-CM | POA: Diagnosis not present

## 2021-08-09 DIAGNOSIS — Z515 Encounter for palliative care: Secondary | ICD-10-CM

## 2021-08-09 DIAGNOSIS — F03B3 Unspecified dementia, moderate, with mood disturbance: Secondary | ICD-10-CM | POA: Diagnosis not present

## 2021-08-09 LAB — CREATININE, SERUM
Creatinine, Ser: 1.04 mg/dL — ABNORMAL HIGH (ref 0.44–1.00)
GFR, Estimated: 59 mL/min — ABNORMAL LOW (ref >60)

## 2021-08-09 NOTE — Progress Notes (Signed)
Family Medicine Teaching Service Daily Progress Note Intern Pager: [phone]  Patient name: Jocelyn Shelton  Medical record number: [id number] Date of birth: 03/17/1956 Age: 66 y.o. Gender: female  Primary Care Provider: Bess KindsSowell, Brandon, MD Consultants: None Code Status: Full  Pt Overview and Major Events to Date:  1/7-admitted  Assessment and Plan:  Jocelyn Shelton  is a 66 y.o. female presenting with worsening confusion and electrolyte derrangement. PMH is significant for COPD/emphysema, hypertension, hyperlipidemia, CAD, GERD, MDD and dementia. Patient is medically stable and awaiting SNF placement when bed available.  Delirium on dementia: Alert and oriented to person and place.  Palliative care was consulted due to patient's dementia and to help family with resources and with coordinating care. -Palliative recommendations, appreciate their assistance -Awaiting SNF  CKD 2 [name]. labs show creatinine of 1.05, improved from 1.14.  Irbesartan has been held due to kidney function, blood pressures mostly normotensive, will hold off on restarting right now. -We will check labs on Monday  Hypertension Blood pressure range of 120s to 140s/ 70s to 90s. -Continue to monitor  All other medical conditions chronic and stable including COPD, HFpEF, CAD, HLD.  FEN/GI: Heart healthy PPx: Lovenox Dispo: Awaiting SNF placement, barriers include insurance authorization  Subjective:  Denies any issues overnight or pain  Objective: Temp:  [97.8 F (36.6 C)-98.3 F (36.8 C)] 98.1 F (36.7 C) (01/14 0326) Pulse Rate:  [59-69] 64 (01/14 0326) Resp:  [17-20] 20 (01/14 0326) BP: (117-142)/(72-97) 126/72 (01/14 0326) SpO2:  [100 %] 100 % (01/14 0326) Physical Exam: General: NAD, laying in bed comfortably Cardiovascular: RRR no murmurs rubs or gallops Respiratory: Some stridor on auscultation, no wheezes rales or crackles Abdomen: Soft, nontender Extremities: No edema  Laboratory: No results  for input(s): WBC, HGB, HCT, PLT in the last 168 hours.  Recent Labs  Lab 08/04/21 0236 08/05/21 0153 08/06/21 0421 08/09/21 0443  NA 133* 135 135  --   K 3.4* 3.7 4.0  --   CL 95* 99 96*  --   CO2 30 28 29   --   BUN 11 12 13   --   CREATININE 1.02* 1.15* 1.14* 1.04*  CALCIUM 8.8* 8.9 9.2  --   GLUCOSE 115* 102* 98  --       Levin ErpJagadish, Alcide Memoli, MD 08/09/2021, 6:12 AM PGY-3, [facility] FPTS Intern pager: [phone], text pages welcome

## 2021-08-09 NOTE — Consult Note (Signed)
Consultation Note Date: 08/09/2021   Patient Name: Jocelyn Shelton  DOB: 1955-10-15  MRN: [id number]  Age / Sex: 66 y.o., female  PCP: Holley Bouche, MD Referring Physician: Lind Covert, MD  Reason for Consultation: Establishing goals of care "Patient with late stage dementia, family needing resources and help coordinating care"  HPI/Patient Profile: 66 y.o. female  with past medical history of COPD/emphysema, rheumatoid arthritis, HFpEF, hypertension, hyperlipidemia, coronary artery disease, GERD, MDD, and dementia who presented to the emergency department on 08/01/2021 with worsening confusion. She was found to have hypokalemia and hypocalcemia. Admitted to FMTS.   Clinical Assessment and Goals of Care: I have reviewed medical records including EPIC notes, labs and imaging, and went to see patient at bedside. She is oriented to person and place. Her speech is delayed. She has no specific complaints.   I spoke with her daughter/Jocelyn by phone to discuss diagnosis, prognosis, GOC, EOL wishes, disposition, and options.  I introduced Palliative Medicine as specialized medical care for people living with serious illness. It focuses on providing relief from the symptoms and stress of a serious illness.   We discussed a brief life review of the patient. Liliauna was married in the remote past, but divorced. Jocelyn is her only child. 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   Patient lives in an apartment in [place]. Jocelyn lives with her and is her primary caregiver. As far as functional status, there has been a progressive decline. At baseline, she is ambulatory. She needs some assistance with ADLs. She is incontinent.    We discussed her current illness and what it means in the larger context of her ongoing co-morbidities.   Discussion was had regarding possible diagnosis of dementia  and its natural trajectory. We reviewed that dementia is a progressive, non-curable disease underlying the patient's current acute medical conditions. We reviewed specific indicators of end stage dementia, including inability to communicate, bed bound/non-ambulatory status, decreased oral intake, and incontinence of bowel/bladder. Created space and opportunity for patient and family to express thoughts and feelings regarding patient's current medical situation. Emotional support provided.   We discussed disposition plan, which is for SNF/rehab. Emphasized that the focus of rehab is improvement of functional status, which can be challenging for patients with cognitive impairment such as dementia. Jocelyn expresses concern regarding her ability to continue caring for her mother at home as her condition declines. Discussed the difference between rehab versus long-term care, and that long-term care is typically covered by Medicaid (which patient has).    If patient does end up needing long-term care (after rehab), I recommended to Jocelyn to consider the addition of hospice support at the facility. I provided education that the focus of hospice is comfort and quality of life within the setting of end-stage illness. I explained that hospice would be extra care for patient, communication with family, and assistance with symptom management and care when she further declines. Jocelyn understands and will consider.  The difference between aggressive medical intervention and comfort  care was considered. We did discuss code status. Encouraged consideration of DNR/DNI status understanding evidenced based poor outcomes in similar hospitalized patients, as the cause of the arrest is likely associated with chronic/terminal disease rather than a reversible acute cardio-pulmonary event. I explained that DNR/DNI is a protective measure to keep [place] from harming the patient in their last moments of life. She seems to indicate that she  would agree that DNR is appropriate however wants to discuss with other family members (patient's siblings) prior to making that decision.  Questions and concerns were addressed.  Provided Jocelyn with PMT contact info and encouraged her to call with questions or concerns.    Primary decision maker: Jocelyn Shelton, daughter    SUMMARY OF RECOMMENDATIONS   Daughter considering DNR/DNI but wants to discuss with other family first - will follow up tomorrow Continue current care Disposition likely SNF/rehab, daughter unsure whether patient will be able to return home and may seek long-term care placement Patient would likely be eligible for hospice, either at home or at the facility (if she needs long-term care)   Code Status/Advance Care Planning: Full code  Prognosis:  Difficult to determine, but < 6 months would not be surprising   Discharge Planning: SNF/rehab      Primary Diagnoses: Present on Admission:  AMS (altered mental status)   I have reviewed the medical record, interviewed the patient and family, and examined the patient. The following aspects are pertinent.  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   Avascular necrosis of humeral head, right ([place]) 06/17/2017   Bronchiectasis ([place]) 06/17/2017   CAD in native artery 05/23/2015   High cholesterol    Hypertension    Leg pain    ABIs 1/18: R 1.2, L 1.1   Myocardial infarction ([facility]) 04/2015   Raynauds syndrome 09/23/2006   Qualifier: Diagnosis of  By: Milinda Antis  MD, Adlih     Reflux    Rheumatoid arthritis ([place])    "all over" (06/16/2017)   Scleroderma ([facility])     Scheduled Meds:  aspirin EC  81 mg Oral Daily   atorvastatin  40 mg Oral q1800   calcium-vitamin D  1 tablet Oral Daily   carvedilol  25 mg Oral BID WC   diltiazem  180 mg Oral Daily   enoxaparin (LOVENOX) injection  40 mg Subcutaneous [id number]   folic acid  1 mg Oral Daily   hydrochlorothiazide   12.5 mg Oral Daily   predniSONE  3 mg Oral Daily   sulfaSALAzine  1,000 mg Oral BID   Continuous Infusions: PRN Meds:.albuterol, LORazepam, LORazepam, LORazepam   Allergies  Allergen Reactions   Ticagrelor Shortness Of Breath   Cymbalta [Duloxetine Hcl] Diarrhea and Nausea Only    No appetite, stomach pain   Lisinopril Cough   Olanzapine Other (See Comments)    Interfered with Blood pressure medications Interfered with Blood pressure medications   Tomato Rash   Review of Systems  Unable to perform ROS: Dementia   Physical Exam Vitals reviewed.  Constitutional:      General: She is not in acute distress.    Comments: Chronically ill-appearing  Pulmonary:     Effort: Pulmonary effort is normal.  Neurological:     Mental Status: She is alert.     Motor: Weakness present.     Comments: Oriented to person and place  Psychiatric:        Cognition and Memory: Cognition is  impaired.    Vital Signs: BP 109/61 (BP Location: Left Arm)    Pulse 70    Temp 97.9 F (36.6 C) (Oral)    Resp 20    Wt 68.8 kg    SpO2 100%    BMI 24.48 kg/m  Pain Scale: PAINAD POSS *See Group Information*: 1-Acceptable,Awake and alert Pain Score: 0-No pain   SpO2: SpO2: 100 % O2 Device:SpO2: 100 % O2 Flow Rate: .   IO: Intake/output summary:  Intake/Output Summary (Last 24 hours) at 08/09/2021 1549 Last data filed at 08/09/2021 0541 Gross per 24 hour  Intake 240 ml  Output 700 ml  Net -460 ml    LBM: Last BM Date: 08/07/21 Baseline Weight: Weight: 68.8 kg Most recent weight: Weight: 68.8 kg      Palliative Assessment/Data: PPS 40%    MDM - High due to: 1 or more chronic illnesses with severe exacerbation, progression, or side effects of treatment OR acute or chronic illness or injury that poses a threat to life or bodily function Review of prior external notes, review of test results, assessment requiring an independent historian Discussion or decision not to resuscitate or to  de-escalate care because poor prognosis   Signed by: Lavena Bullion, NP   Please contact Palliative Medicine Team phone at [phone] for questions and concerns.  For individual provider: See Shea Evans

## 2021-08-10 DIAGNOSIS — F0390 Unspecified dementia without behavioral disturbance: Secondary | ICD-10-CM | POA: Diagnosis not present

## 2021-08-10 DIAGNOSIS — Z66 Do not resuscitate: Secondary | ICD-10-CM

## 2021-08-10 DIAGNOSIS — E860 Dehydration: Secondary | ICD-10-CM | POA: Diagnosis not present

## 2021-08-10 DIAGNOSIS — R54 Age-related physical debility: Secondary | ICD-10-CM

## 2021-08-10 DIAGNOSIS — E876 Hypokalemia: Secondary | ICD-10-CM | POA: Diagnosis not present

## 2021-08-10 DIAGNOSIS — R4182 Altered mental status, unspecified: Secondary | ICD-10-CM | POA: Diagnosis not present

## 2021-08-10 NOTE — Progress Notes (Signed)
Daily Progress Note   Patient Name: Jocelyn Shelton        Date: 08/10/2021 DOB: 11/19/1955  Age: 66 y.o. MRN#: [id number] Attending Physician: Carney Livinghambliss, Marshall L, MD Primary Care Physician: Bess KindsSowell, Brandon, MD Admit Date: 08/01/2021   HPI/Patient Profile: 66 y.o. female  with past medical history of COPD/emphysema, rheumatoid arthritis, HFpEF, hypertension, hyperlipidemia, coronary artery disease, GERD, MDD, and dementia who presented to the emergency department on 08/01/2021 with worsening confusion. She was found to have hypokalemia and hypocalcemia. Admitted to FMTS.   Subjective: Chart reviewed. Note that patient is medically stable and awaiting SNF bed placement.   I spoke with daughter/April by phone. We discussed code status. April states she was able to speak with 2 of her other's siblings and they were in agreement with DNR/DNI status. April now feels comfortable top proceed changing code status to DNR/DNI with understanding that patient will not undergo resuscitation efforts in the even of cardiopulmonary arrest. Reviewed that resuscitation efforts such CPR, defibrillation, and intubation may be more harmful than beneficial in patient's frail state.   We reviewed current disposition plan for SNF/rehab. Reviewed the difference between rehab versus long-term care, and that long-term care is typically covered by Medicaid (which patient has).    If patient does end up needing long-term care (after rehab), I reviewed recommendation to consider the addition of hospice support at the facility. In the meantime, I offered and explained the option for an outpatient palliative referral to check-in periodically with patient and family and continue goals of care discussions. Discussed that outpatient  palliative can occur simultaneously while a patient is in rehab and can assist with transition to hospice when her   condition declines in the future.    Objective:   Physical Exam Vitals reviewed.  Constitutional:      General: She is not in acute distress.    Comments: Chronically ill-appearing  Pulmonary:     Effort: Pulmonary effort is normal.  Neurological:     Motor: Weakness present.  Psychiatric:        Cognition and Memory: Cognition is impaired.            Vital Signs: BP 132/76    Pulse 75    Temp 98.4 F (36.9 C) (Oral)    Resp 20    Wt 68.8  kg    SpO2 99%    BMI 24.48 kg/[place]  SpO2: SpO2: 99 % O2 Device: O2 Device: Room Air O2 Flow Rate:     LBM: Last BM Date: 08/10/21 Baseline Weight: Weight: 68.8 kg Most recent weight: Weight: 68.8 kg       Palliative Assessment/Data: PPS 40%     Palliative Care Assessment & Plan    Assessment: - delirium - dementia - CKD 2 - debility/deconditioning  Recommendations/Plan: Code status changed to DNR/DNI Continue current care Disposition likely SNF/rehab, daughter unsure whether patient will be able to return home after rehab and may seek long-term care placement Patient would likely be eligible for hospice, either at home or at the facility (if she needs long-term care) Outpatient palliative referral (to [facility])   Code Status: DNR/DNI   Prognosis:  Difficult to determine, but less than 6 months would not be surprising  Discharge Planning: Skilled Nursing Facility for rehab with Palliative care service follow-up   MDM - High due to: 1 or more chronic illnesses with severe exacerbation, progression, or side effects of treatment OR acute or chronic illness or injury that poses a threat to life or bodily function Review of prior external notes, review of test results, assessment requiring an independent historian Discussion or decision not to resuscitate or to de-escalate care because poor  prognosis      Greater than 50%  of this time was spent counseling and coordinating care related to the above assessment and plan.  Merry Proud, NP  Please contact Palliative Medicine Team phone at [phone] for questions and concerns.

## 2021-08-10 NOTE — Progress Notes (Signed)
FPTS Brief Note Reviewed patient's vitals, recent notes.  Vitals:   08/10/21 1611 08/10/21 1946  BP: 138/67 130/74  Pulse: 66 64  Resp: 18 16  Temp: 98.5 F (36.9 C) 98.6 F (37 C)  SpO2: 99% 100%   At this time, no change in plan from day progress note.  Bess Kinds, MD Page [phone] with questions about this patient.

## 2021-08-10 NOTE — Progress Notes (Signed)
FPTS Brief Note Reviewed patient's vitals, recent notes.  Vitals:   08/09/21 1935 08/09/21 2342  BP: 114/71 128/77  Pulse: 69 66  Resp: 20 18  Temp: 97.6 F (36.4 C) 98.3 F (36.8 C)  SpO2: 100% 100%   At this time, no change in plan from day progress note.  Fayette Pho, MD Page [phone] with questions about this patient.

## 2021-08-10 NOTE — Progress Notes (Signed)
Family Medicine Teaching Service Daily Progress Note Intern Pager: [phone]  Patient name: Jocelyn Shelton  Medical record number: [id number] Date of birth: 02/27/1956 Age: 66 y.o. Gender: female  Primary Care Provider: Bess KindsSowell, Brandon, MD Consultants: Palliative care Code Status: Full  Pt Overview and Major Events to Date:  Admitted: 08/02/2021  Assessment and Plan:  Jocelyn Shelton  is a 66 y.o. female presenting with worsening confusion and electrolyte derrangement. PMH is significant for COPD/emphysema, hypertension, hyperlipidemia, CAD, GERD, MDD and dementia. Patient is medically stable and awaiting SNF placement.   Delirium in the setting of Dementia  Unable to appropriately assess given patient in the midst of getting up, alert and oriented to person and place.  -Follow palliative recs, appreciate assistance with coordinating care and informing family of resource available -awaiting SNF  CKD 3a Most recent Cr 1.04 with GFR 59. -follow up BMP on 1/16  Hypertension Normotensive most recently.  -continue coreg  -continue HCTZ -monitor BP -irbesartan held given normotensive blood pressures, restart if/when appropriate   All other medical conditions chronic and stable.  FEN/GI: heart healthy  PPx: lovenox  Dispo:SNF  pending placement . Barriers include availability.   Subjective:  No acute overnight events. Patient denies any concerns at this time.   Objective: Temp:  [97.6 F (36.4 C)-98.3 F (36.8 C)] 98.3 F (36.8 C) (01/14 2342) Pulse Rate:  [64-85] 66 (01/14 2342) Resp:  [18-20] 18 (01/14 2342) BP: (109-128)/(61-77) 128/77 (01/14 2342) SpO2:  [94 %-100 %] 100 % (01/14 2342) Physical Exam: General: Patient resting comfortably in bed, in no acute distress. Cardiovascular: RRR, no murmurs or gallops auscultated  Respiratory: CTAB, no wheezing, rales or rhonchi noted Abdomen: soft, nontender, nondistended, presence of bowel sounds Extremities: radial and distal  pulses strong and equal bilaterally, no LE edema noted bilaterally Psych: mood appropriate   Laboratory: No results for input(s): WBC, HGB, HCT, PLT in the last 168 hours. Recent Labs  Lab 08/04/21 0236 08/05/21 0153 08/06/21 0421 08/09/21 0443  NA 133* 135 135  --   K 3.4* 3.7 4.0  --   CL 95* 99 96*  --   CO2 30 28 29   --   BUN 11 12 13   --   CREATININE 1.02* 1.15* 1.14* 1.04*  CALCIUM 8.8* 8.9 9.2  --   GLUCOSE 115* 102* 98  --       Imaging/Diagnostic Tests: No results found.   Jocelyn Shelton, Jocelyn Boehm, DO 08/10/2021, 3:10 AM PGY-2,  Family Medicine FPTS Intern pager: [phone], text pages welcome

## 2021-08-10 NOTE — Progress Notes (Addendum)
Family Medicine Teaching Service Daily Progress Note Intern Pager: [phone]  Patient name: Jocelyn Shelton Medical record number: [id number] Date of birth: [birth year] Age: 66 y.o. Gender: female  Primary Care Provider: Holley Bouche, MD Consultants: Palliative care Code Status: DNR  Pt Overview and Major Events to Date:  08/02/2021 admitted  Assessment and Plan:  Jocelyn Shelton is a 66 year old female presenting with worsening confusion and electrolyte derangement. PMH significant for COPD/emphysema, HTN, HLS, CAD, GERD, MDD, and dementia. Patient is medically stable and awaiting SNF placement.  Delirium in the setting of Dementia This morning patient is oriented to person. After conversation with palliative yesterday has been switched to DNR/DNI and outpatient palliative follow up. -Awaiting SNF placement -d/c atorvastatin  CKD 3a Creatinine today 1.02 from 1.04. -Monitor  Hyponatremia Na this morning 121 > repeat was 121. -Monitor -change to regular diet -restrict fluid -will put in d/c summary to follow up  HTN BP ranges have been mostly normotensive, 130s to 140s over 70s-80s.  -Continue coreg 25 mg BID -discontinue HCTZ 12.5 mg daily -monitor BP -irbesartan held due to normotensive BP   FEN/GI: regular with fluid restriction PPx: lovenox Dispo:SNF Medically stable  Subjective:  No issues this morning or complaints  Objective: Temp:  [97.9 F (36.6 C)-98.7 F (37.1 C)] 98.6 F (37 C) (01/15 1946) Pulse Rate:  [64-75] 64 (01/15 1946) Resp:  [16-20] 16 (01/15 1946) BP: (128-138)/(67-77) 130/74 (01/15 1946) SpO2:  [99 %-100 %] 100 % (01/15 1946) Physical Exam: General: Nad, laying in bed comfortably Cardiovascular: RRR no m/r/g Respiratory: CTAB no w/r/c Abdomen: Nondistended, nontender to palpation Extremities: No edema  Laboratory: No results for input(s): WBC, HGB, HCT, PLT in the last 168 hours. Recent Labs  Lab 08/04/21 0236 08/05/21 0153  08/06/21 0421 08/09/21 0443  NA 133* 135 135  --   K 3.4* 3.7 4.0  --   CL 95* 99 96*  --   CO2 30 28 29   --   BUN 11 12 13   --   CREATININE 1.02* 1.15* 1.14* 1.04*  CALCIUM 8.8* 8.9 9.2  --   GLUCOSE 115* 102* 98  --     Imaging/Diagnostic Tests:   Gerrit Heck, MD 08/10/2021, 9:59 PM PGY-1, [place] Intern pager: [phone], text pages welcome

## 2021-08-11 DIAGNOSIS — R4182 Altered mental status, unspecified: Secondary | ICD-10-CM | POA: Diagnosis not present

## 2021-08-11 DIAGNOSIS — F03B3 Unspecified dementia, moderate, with mood disturbance: Secondary | ICD-10-CM | POA: Diagnosis not present

## 2021-08-11 DIAGNOSIS — R410 Disorientation, unspecified: Secondary | ICD-10-CM | POA: Diagnosis not present

## 2021-08-11 LAB — BASIC METABOLIC PANEL
Anion gap: 10 (ref 5–15)
Anion gap: 7 (ref 5–15)
BUN: 15 mg/dL (ref 8–23)
BUN: 13 mg/dL (ref 8–23)
CO2: 25 mmol/L (ref 22–32)
CO2: 25 mmol/L (ref 22–32)
Calcium: 9 mg/dL (ref 8.9–10.3)
Calcium: 8.7 mg/dL — ABNORMAL LOW (ref 8.9–10.3)
Chloride: 86 mmol/L — ABNORMAL LOW (ref 98–111)
Chloride: 89 mmol/L — ABNORMAL LOW (ref 98–111)
Creatinine, Ser: 1.02 mg/dL — ABNORMAL HIGH (ref 0.44–1.00)
Creatinine, Ser: 1.07 mg/dL — ABNORMAL HIGH (ref 0.44–1.00)
GFR, Estimated: >60 mL/min (ref >60)
GFR, Estimated: 57 mL/min — ABNORMAL LOW (ref >60)
Glucose, Bld: 101 mg/dL — ABNORMAL HIGH (ref 70–99)
Glucose, Bld: 102 mg/dL — ABNORMAL HIGH (ref 70–99)
Potassium: 4.2 mmol/L (ref 3.5–5.1)
Potassium: 4.1 mmol/L (ref 3.5–5.1)
Sodium: 121 mmol/L — ABNORMAL LOW (ref 135–145)
Sodium: 121 mmol/L — ABNORMAL LOW (ref 135–145)

## 2021-08-11 LAB — RESP PANEL BY RT-PCR (FLU A&B, COVID) ARPGX2
Influenza A by PCR: NEGATIVE
Influenza B by PCR: NEGATIVE
SARS Coronavirus 2 by RT PCR: NEGATIVE

## 2021-08-11 NOTE — Progress Notes (Incomplete)
Report called to [facility]. All questions answered. No IV or tele to remove. Patient discharge instructions in packet for receiving facility.

## 2021-08-11 NOTE — TOC Transition Note (Signed)
Transition of Care [facility](TOC) - CM/SW Discharge Note   Patient Details  Name: Hale BogusBirdie M  MRN: [id number] Date of Birth: 05/13/1956  Transition of Care [facility](TOC) CM/SW Contact:  Baldemar LenisElizabeth M Henchy Mccauley, LCSW Phone Number: 08/11/2021, 2:35 PM   Clinical Narrative:   Nurse to call report to [phone].    Final next level of care: Skilled Nursing Facility Barriers to Discharge: Barriers Resolved   Patient Goals and CMS Choice Patient states their goals for this hospitalization and ongoing recovery are:: Pt disoriented and unable to participate in goal setting. CMS Medicare.gov Compare Post Acute Care list provided to:: Patient Represenative (must comment) (April, Daughter) Choice offered to / list presented to : Adult Children  Discharge Placement              [facility] Patient to be transferred to facility by: [facility] Name of family member notified: April Patient and family notified of of transfer: 08/11/21  Discharge Plan and Services     Post Acute Care Choice: Skilled Nursing Facility                               Social Determinants of Health (SDOH) Interventions     Readmission Risk Interventions No flowsheet data found.

## 2021-08-11 NOTE — Progress Notes (Signed)
° °  Referral was received from Richrd Humbles NP with [facility] for services of Palliative Care at the SNF when d/c from hospital. We have received this referral and will work with d/c facility to offer [facility]. .We do have availability to offer [facility] which is a home based palliative care program provided by [facility] upon d/c from rehab as well.   Thank you for this referral. We will follow up with family and facility at d/c. Norm Parcel RN [phone]

## 2021-08-11 NOTE — Discharge Summary (Addendum)
[facility] Discharge Summary  Patient name: Jocelyn Shelton Medical record number: [id number] Date of birth: 03-29-1956 Age: 66 y.o. Gender: female Date of Admission: 08/01/2021  Date of Discharge: 08/11/2021 Admitting Physician: Zenia Resides, MD  Primary Care Provider: Holley Bouche, MD Consultants: Palliative care  Indication for Hospitalization: AMS  Discharge Diagnoses/Problem List:  HTN Hypokalemia AMS Moderate dementia CKD II  Disposition: SNF  Discharge Condition: Stable  Discharge Exam:  General: NAD, laying in bed comfortably, A&Ox1 to person  Cardiovascular: RRR no m/r/g Respiratory: CTAB no w/r/c Abdomen: Nondistended, nontender to palpation Extremities: No edema   Brief Hospital Course:  Jocelyn Shelton is a 66 y.o. female presenting with worsening confusion and electrolyte derrangement. PMH is significant for COPD/emphysema, hypertension, hyperlipidemia, CAD, GERD, MDD.  Worsening confusion   Hx of dementia Patient presented with worsening confusion, difficulty with ambulation, and incontinence. Head CT with cerebral atrophy and ventricular dilation. Given clinical picture, there was suspicion for normal pressure hydrocephaly. MRI of brain showing no intracranial abnormality.  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  Partially visible cervical spine degeneration with up to mild spinal stenosis. After speaking with patient's daughter, it was determined that her dementia is worsening and this may represent her new baseline. Home zyprexa was held and patient was monitored. Patient was evaluated by PT/OT and determined to need SNF with possible transition to ALF/memory care unit. Palliative care was consulted and after discussion with family made patient DNR/DNI. Palliative will follow her outpatient.  Hypokalemia Patient with  significant hypokalemia during admission that was repleted with potassium and had subsequent improvement. Patient K was stable by discharge.  AKI Patient's Cr began to rise, home losartan was held. Cr was stable by discharge and resolved.   Hyponatremia Hyponatremia to 121 on day of discharge. Most likely secondary to excess free water intake, low sodium diet while she was inpatient and HCTZ. Patient had these changed inpatient (fluid restriction, HCTZ stopped, regular sodium diet) and remained asymptomatic and felt stable for discharge.  All other chronic conditions were stable and managed with home medications as appropriate.    Issues for Follow Up:  Patient was on aspirin and plavix from home, we discontinued plavix  We discontinued HCTZ, for K wasting and normotensive pressures Consider rechecking sodium if family would wish continued blood draws given patient considering palliative care and following with them outpatient Olanzapine held, consider restarting as appropriate Irbesartan held and patient was normotensive, restart if appropriate We discontinued atorvastatin   Significant Procedures: None  Significant Labs and Imaging:  No results for input(s): WBC, HGB, HCT, PLT in the last 168 hours. Recent Labs  Lab 08/05/21 0153 08/06/21 0421 08/09/21 0443 08/11/21 0058 08/11/21 0700  NA 135 135  --  121* 121*  K 3.7 4.0  --  4.2 4.1  CL 99 96*  --  86* 89*  CO2 28 29  --  25 25  GLUCOSE 102* 98  --  101* 102*  BUN 12 13  --  15 13  CREATININE 1.15* 1.14* 1.04* 1.02* 1.07*  CALCIUM 8.9 9.2  --  9.0 8.7*      Results/Tests Pending at Time of Discharge:   Discharge Medications:  Allergies as of 08/11/2021       Reactions   Ticagrelor Shortness Of Breath   Cymbalta [duloxetine Hcl] Diarrhea, Nausea Only   No  appetite, stomach pain   Lisinopril Cough   Olanzapine Other (See Comments)   Interfered with Blood pressure medications Interfered with Blood pressure  medications   Tomato Rash        Medication List     STOP taking these medications    atorvastatin 40 MG tablet Commonly known as: LIPITOR   Black Cohosh 40 MG Caps   clopidogrel 75 MG tablet Commonly known as: PLAVIX   diltiazem 180 MG 24 hr capsule Commonly known as: Dilt-XR   hydrochlorothiazide 25 MG tablet Commonly known as: HYDRODIURIL   OLANZapine 5 MG tablet Commonly known as: ZYPREXA   valsartan 320 MG tablet Commonly known as: DIOVAN       TAKE these medications    acetaminophen 500 MG tablet Commonly known as: TYLENOL Take 1,000 mg by mouth every 6 (six) hours as needed for moderate pain or headache.   albuterol 108 (90 Base) MCG/ACT inhaler Commonly known as: Proventil HFA INHALE TWO PUFFS BY MOUTH EVERY 4 HOURS AS NEEDED FOR WHEEZING FOR COUGH FOR SHORTNESS OF BREATH What changed:  how much to take how to take this when to take this reasons to take this additional instructions   Anoro Ellipta 62.5-25 MCG/ACT Aepb Generic drug: umeclidinium-vilanterol Inhale 1 puff into the lungs daily.   aspirin 81 MG EC tablet Take 1 tablet (81 mg total) by mouth daily.   CALCIUM 600+D3 PO Take 1 tablet by mouth daily.   carvedilol 25 MG tablet Commonly known as: COREG TAKE 1 TABLET (25 MG TOTAL) BY MOUTH 2 (TWO) TIMES DAILY WITH A MEAL.   diltiazem 180 MG 24 hr capsule Commonly known as: CARDIZEM CD TAKE 1 CAPSULE BY MOUTH EVERY DAY What changed: how much to take   folic acid 1 MG tablet Commonly known as: FOLVITE TAKE 1 TABLET (1 MG TOTAL) BY MOUTH DAILY.   multivitamin with minerals Tabs tablet Take 1 tablet by mouth daily.   nitroGLYCERIN 0.4 MG SL tablet Commonly known as: NITROSTAT PLACE 1 TABLET UNDER THE TONGUE EVERY 5 MINUTES AS NEEDED FOR CHEST PAIN   omeprazole 40 MG capsule Commonly known as: PRILOSEC Take 40 mg by mouth every evening.   predniSONE 1 MG tablet Commonly known as: DELTASONE Take 3 mg by mouth daily with  breakfast.   sulfaSALAzine 500 MG tablet Commonly known as: AZULFIDINE Take 2 tablets (1,000 mg total) by mouth 2 (two) times daily.        Discharge Instructions: Please refer to Patient Instructions section of EMR for full details.  Patient was counseled important signs and symptoms that should prompt return to medical care, changes in medications, dietary instructions, activity restrictions, and follow up appointments.   Follow-Up Appointments:  Contact information for after-discharge care     [facility] .   Service: Skilled Nursing Contact information: [address] [place]                     Gerrit Heck, MD 08/11/2021, 3:41 PM PGY-1, [place]

## 2021-08-12 NOTE — Progress Notes (Signed)
Pt discharged waiting for transport to nursing home, [facility] arrived to pick up pt at 0230, left with pt and available belongings in room at 0240 to [facility], pt reassured. Obasogie-Asidi, Micca Matura Efe

## 2021-08-16 ENCOUNTER — Other Ambulatory Visit: Payer: Self-pay | Admitting: Internal Medicine

## 2021-08-20 NOTE — Progress Notes (Deleted)
° ° °  SUBJECTIVE:   CHIEF COMPLAINT / HPI:   Hospital F/u  Concerns:  Palliative care following:  Med review:  BMP today  ABI??  ***  PERTINENT  PMH / PSH: ***  OBJECTIVE:   There were no vitals taken for this visit.  ***  ASSESSMENT/PLAN:   No problem-specific Assessment & Plan notes found for this encounter.     Bess Kinds, MD [facility]

## 2021-08-21 ENCOUNTER — Ambulatory Visit: Payer: Medicare Other | Admitting: Student

## 2021-10-27 ENCOUNTER — Other Ambulatory Visit: Payer: Self-pay

## 2021-10-27 ENCOUNTER — Ambulatory Visit (INDEPENDENT_AMBULATORY_CARE_PROVIDER_SITE_OTHER): Payer: Medicare Other | Admitting: Family Medicine

## 2021-10-27 ENCOUNTER — Encounter: Payer: Self-pay | Admitting: Family Medicine

## 2021-10-27 VITALS — BP 117/72 | HR 72 | Wt 145.2 lb

## 2021-10-27 DIAGNOSIS — F03B0 Unspecified dementia, moderate, without behavioral disturbance, psychotic disturbance, mood disturbance, and anxiety: Secondary | ICD-10-CM | POA: Diagnosis not present

## 2021-10-27 DIAGNOSIS — E871 Hypo-osmolality and hyponatremia: Secondary | ICD-10-CM

## 2021-10-27 DIAGNOSIS — I2510 Atherosclerotic heart disease of native coronary artery without angina pectoris: Secondary | ICD-10-CM | POA: Diagnosis not present

## 2021-10-27 DIAGNOSIS — Z09 Encounter for follow-up examination after completed treatment for conditions other than malignant neoplasm: Secondary | ICD-10-CM | POA: Diagnosis present

## 2021-10-27 NOTE — Patient Instructions (Addendum)
It was wonderful to see you today. ? ?Please bring ALL of your medications with you to every visit.  ? ?Today we talked about: ? ?- Checking sodium level ?- Placing referral to palliative care ?- Not electing to restart olanzapine ?- Following up with cardiology at any event ?- Letter indicating diagnoses of dementia ?Follow-up with primary care doctor in 2 to 4 weeks. ? ?Please be sure to schedule follow up at the front  desk before you leave today.  ? ?If you haven't already, sign up for My Chart to have easy access to your labs results, and communication with your primary care physician. ? ?Please call the clinic at [phone] if your symptoms worsen or you have any concerns. It was our pleasure to serve you. ? ?Dr. Janus Molder ? ?

## 2021-10-27 NOTE — Progress Notes (Signed)
? ? ?  SUBJECTIVE:  ? ?CHIEF COMPLAINT / HPI:  ? ?Discharge follow up ?Discharged from hospital and rehab facility. She was initially admitted from dementia. Daughter is main caregiver now and present today. She is in the process of becoming her POA. Today she has concerns surrounding the timing of her medication. The daughter states she has not noticed any odd behaviors without taking olanzapine that was stopped in the hospital. She does not desire to restart it today. She would like to have her sodium level checked.  ? ?Of note, she has nursing services that come to her home. They were supposed to be come Sunday but did not. The daughter has the number. The daughter also states she would like a palliative care consult, she states this was discussed in the hospital but she has not received a call.  ? ?PERTINENT  PMH / PSH: Dementia ? ?OBJECTIVE:  ? ?BP 117/72   Pulse 72   Wt 145 lb 3.2 oz (65.9 kg)   SpO2 100%   BMI 23.44 kg/m?   ?General: Appears well, no acute distress. Age appropriate. ?Cardiac: RRR, normal heart sounds, no murmurs ?Respiratory: CTAB, normal effort ?Neuro: alert. Nonverbal during entire encounter. Apparently will answer with one word answers ?Psych: pleasant demeanor ?  ?ASSESSMENT/PLAN:  ? ?Need for follow-up care after discharge ?Admitted to [facility] 08/01/2021 for disorientation and worsening dementia.  Discharge 08/11/2021.  Was discharged to skilled nursing facility.  This is the first week she has been home with her daughter.  Daughter is now sole caregiver.  They do have home nursing care lined up.  Did not show yesterday.  Encouraged daughter to call the number that she has for them.  Daughter seeking POA and needs letter stating patient's dementia diagnosis.  Letter given today.  Reviewed discharge summary from hospital and olanzapine was stopped.  Daughter has no desire to restart olanzapine.  I agree with this.  She has not noticed any mood or attitude changes without the  medication.  No history of aggressive behavior.  She appears pleasant today.  Daughter does desire to continue to pursue palliative care to further discuss goals of care.  Referral placed during this encounter. ? ?Hyponatremia ?Sodium 122 1/16.  Likely due to dementia status and hydrochlorothiazide which was stopped in the hospital.  We will recheck sodium today. ?- Basic Metabolic Panel ? ?Moderate dementia without behavioral disturbance, psychotic disturbance, mood disturbance, or anxiety, unspecified dementia type (HCC) ?As above referral placed in today's encounter. ?- Amb Referral to Palliative Care ? ?Follow-up in 2 to 4 weeks with primary care doctor. ? ?Lavonda Jumbo, DO ?[facility]  ?

## 2021-10-28 ENCOUNTER — Telehealth: Payer: Self-pay

## 2021-10-28 LAB — BASIC METABOLIC PANEL
BUN/Creatinine Ratio: 8 — ABNORMAL LOW (ref 12–28)
BUN: 8 mg/dL (ref 8–27)
CO2: 27 mmol/L (ref 20–29)
Calcium: 9.5 mg/dL (ref 8.7–10.3)
Chloride: 100 mmol/L (ref 96–106)
Creatinine, Ser: 1.04 mg/dL — ABNORMAL HIGH (ref 0.57–1.00)
Glucose: 113 mg/dL — ABNORMAL HIGH (ref 70–99)
Potassium: 5.3 mmol/L — ABNORMAL HIGH (ref 3.5–5.2)
Sodium: 138 mmol/L (ref 134–144)
eGFR: 59 mL/min/{1.73_m2} — ABNORMAL LOW (ref >59)

## 2021-10-28 NOTE — Telephone Encounter (Signed)
[facility] Nurse calls nurse line requesting verbal orders for [facility] nursing as follows. ? ?1x a week for 8 weeks  ? ?PT/OT evaluation.  ? ?Verbal orders given.  ?

## 2021-10-30 ENCOUNTER — Telehealth: Payer: Self-pay

## 2021-10-30 NOTE — Telephone Encounter (Signed)
Attempted to contact patient's daughter April to schedule a Palliative Care consult appointment. No answer left a message to return call.  ?

## 2021-11-05 ENCOUNTER — Telehealth: Payer: Self-pay

## 2021-11-05 NOTE — Telephone Encounter (Signed)
Mickel Baas [facility] PT calls nurse line requesting verbal order for [facility] PT as follows. ? ?1x a week for 6 weeks  ? ?Speech Evaluation.  ? ?Verbal order given. ?

## 2021-11-06 ENCOUNTER — Telehealth: Payer: Self-pay

## 2021-11-06 NOTE — Telephone Encounter (Signed)
Attempted to contact patient's daughter April to schedule a Palliative Care consult appointment. No answer left a message to return call.  ?

## 2021-11-07 NOTE — Progress Notes (Deleted)
  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   Avascular necrosis of humeral head, right (HCC) 06/17/2017   Bronchiectasis (HCC) 06/17/2017   CAD in native artery 05/23/2015   High cholesterol    Hypertension    Leg pain    ABIs 1/18: R 1.2, L 1.1   Myocardial infarction (HCC) 04/2015   Raynauds syndrome 09/23/2006   Qualifier: Diagnosis of  By: Moreno-Coll  MD, Adlih     Reflux    Rheumatoid arthritis (HCC)    "all over" (06/16/2017)   Scleroderma (HCC)     Past Surgical History:  Procedure Laterality Date   APPENDECTOMY  90's   CARDIAC CATHETERIZATION N/A 05/22/2015   Procedure: Left Heart Cath and Coronary Angiography;  Surgeon: Lennette Bihari, MD;  Location: [facility] INVASIVE CV LAB;  Service: Cardiovascular;  Laterality: N/A;   CARDIAC CATHETERIZATION N/A 05/22/2015   Procedure: Coronary Stent Intervention;  Surgeon: Lennette Bihari, MD;  Location: [facility] INVASIVE CV LAB;  Service: Cardiovascular;  Laterality: N/A;    OBJECTIVE:  There were no vitals taken for this visit.  General: NAD, pleasant, able to participate in exam Cardiac: RRR, no murmurs auscultated. Respiratory: CTAB, normal effort, no wheezes, rales or rhonchi Abdomen: soft, non-tender, non-distended, normoactive bowel sounds Extremities: warm and well perfused, no edema or cyanosis. Skin: warm and dry, no rashes noted Neuro: alert, no obvious focal deficits, speech normal Psych: Normal affect and mood  ASSESSMENT/PLAN:  No problem-specific Assessment & Plan notes found for this encounter.   No orders of the defined types were placed in this encounter.  No orders of the defined types were placed in this encounter.  No follow-ups on file. @SIGNNOTE @ {    This will disappear when note is signed, click to select method of visit    :1}

## 2021-11-10 ENCOUNTER — Encounter: Payer: Self-pay | Admitting: Family Medicine

## 2021-11-10 ENCOUNTER — Telehealth: Payer: Self-pay

## 2021-11-10 NOTE — Telephone Encounter (Signed)
Patients daughter calls nurse line requesting a supportive letter to [facility] allowing her to accompany her mother to apts.  ? ?Daughter reports she receives medicaid transportation, however she can not ride alone. Daughter is requesting a letter stating the patient can not medically ride without an escort riding with her to and from apts.  ? ?Patients daughter, April Graves, will be her escort.  ? ?Once written we can fax to [phone]. ?

## 2021-11-10 NOTE — Telephone Encounter (Signed)
Letter written. Red Team please fax to listed number. Letter in letters tab to be printed.  ? ?Dorris Singh, MD  ?Family Medicine Teaching Service  ? ?

## 2021-11-12 ENCOUNTER — Telehealth: Payer: Self-pay

## 2021-11-12 NOTE — Telephone Encounter (Signed)
Spoke with patient's daughter April and scheduled a Mychart Palliative Consult for 11/13/21 @ 12:30 PM.  ? ?Consent obtained; updated Netsmart, Team List and Epic.  ? ?

## 2021-11-12 NOTE — Telephone Encounter (Signed)
Attempted to contact patient's daughter April to schedule a Palliative Care consult appointment. No answer left a message to return call.  ?

## 2021-11-12 NOTE — Telephone Encounter (Signed)
[facility] OT requesting verbal orders for [facility] OT as follows.  ? ?1x a week for 4 weeks  ?HH Speech evaluation ? ?Verbal order given.  ?

## 2021-11-13 ENCOUNTER — Other Ambulatory Visit: Payer: Self-pay

## 2021-11-13 ENCOUNTER — Ambulatory Visit: Payer: Medicare Other | Admitting: Student

## 2021-11-13 ENCOUNTER — Ambulatory Visit (INDEPENDENT_AMBULATORY_CARE_PROVIDER_SITE_OTHER): Payer: Medicare Other | Admitting: Student

## 2021-11-13 ENCOUNTER — Telehealth: Payer: Self-pay | Admitting: Student

## 2021-11-13 ENCOUNTER — Telehealth: Payer: Medicare Other | Admitting: Hospice

## 2021-11-13 ENCOUNTER — Encounter: Payer: Self-pay | Admitting: Student

## 2021-11-13 VITALS — BP 137/82 | HR 79 | Wt 142.0 lb

## 2021-11-13 DIAGNOSIS — R053 Chronic cough: Secondary | ICD-10-CM | POA: Diagnosis not present

## 2021-11-13 DIAGNOSIS — F03B0 Unspecified dementia, moderate, without behavioral disturbance, psychotic disturbance, mood disturbance, and anxiety: Secondary | ICD-10-CM

## 2021-11-13 DIAGNOSIS — R531 Weakness: Secondary | ICD-10-CM

## 2021-11-13 DIAGNOSIS — Z515 Encounter for palliative care: Secondary | ICD-10-CM

## 2021-11-13 DIAGNOSIS — G4700 Insomnia, unspecified: Secondary | ICD-10-CM | POA: Insufficient documentation

## 2021-11-13 DIAGNOSIS — I10 Essential (primary) hypertension: Secondary | ICD-10-CM

## 2021-11-13 DIAGNOSIS — J449 Chronic obstructive pulmonary disease, unspecified: Secondary | ICD-10-CM

## 2021-11-13 DIAGNOSIS — I2510 Atherosclerotic heart disease of native coronary artery without angina pectoris: Secondary | ICD-10-CM

## 2021-11-13 MED ORDER — FLUTICASONE PROPIONATE 50 MCG/ACT NA SUSP
1.0000 | Freq: Every day | NASAL | 12 refills | Status: DC
Start: 2021-11-13 — End: 2022-02-26

## 2021-11-13 MED ORDER — DOXEPIN HCL 6 MG PO TABS: 6.0000 mg | ORAL_TABLET | Freq: Every day | ORAL | 0 refills | Status: DC

## 2021-11-13 NOTE — Progress Notes (Signed)
? ? ?Civil engineer, contracting ?Community Palliative Care Consult Note ?Telephone: [phone]  ?Fax: [phone] ? ?PATIENT NAME: Jocelyn Shelton ?[address] ?[address] ?[place] [address] ?[phone] (home)  ?DOB: 08-Aug-1955 ?MRN: [id number] ? ?PRIMARY CARE PROVIDER:    ?Bess Kinds, MD,  ?[address] [place] [address] ?[phone] ? ?REFERRING PROVIDER:   ?Bess Kinds, MD ?[address] ?[place],  [place] [address] ?[phone] ? ?RESPONSIBLE PARTY:   April ?Contact Information   ? ? Name Relation Home Work Mobile  ? Antonopoulos,April Daughter [phone]    ? Morris,William Friend   [phone]  ? ?  ? ? ?TELEHEALTH VISIT STATEMENT ?Due to the COVID-19 crisis, this visit was done via telemedicine from my office and it was initiated and consent by this patient and or family.  ?I connected with patient OR PROXY by a telephone/video  and verified that I am speaking with the correct person. I discussed the limitations of evaluation and management by telemedicine. The patient expressed understanding and agreed to proceed. ?Palliative Care was asked to follow this patient to address advance care planning, complex medical decision making and goals of care clarification. This is the initial visit.  ? ?  ASSESSMENT AND / RECOMMENDATIONS:  ? ?Advance Care Planning: Our advance care planning conversation included a discussion about:    ?The value and importance of advance care planning  ?Difference between Hospice and Palliative care ?Exploration of goals of care in the event of a sudden injury or illness  ?Identification and preparation of a healthcare agent  ?Review and updating or creation of an  advance directive document . ?Decision not to resuscitate or to de-escalate disease focused treatments due to poor prognosis. ? ?CODE STATUS: Patient is a Do Not Resuscitate ? ?Goals of Care: Goals include to maximize quality of life and symptom management ? ?I spent 16 minutes providing this initial  consultation. More than 50% of the time in this consultation was spent on counseling patient and coordinating communication. ?-------------------------------------------------------------------------------------------------------------------------------------- ? ?Symptom Management/Plan: ?Dementia: worsening ongoing memory loss/confusion in line with Dementia disease trajectory. FAST 6C incontinent of bladder, assistance with ADLs. Continue Namenda as ordered. Optimize ongoing supportive care.   ?HTN: Managed by Diltiazem and Coreg. BP checks at within normal range.  ?Weakness: PT/OT is ongoing for strengthening and ambulation. Uses a cane. Fall precautions discussed.  ?COPD: Managed with Albuterol and Anoro Ellipta. Continue Prednisone as ordered. Followed by Pulmonologist. F/u appointment 11/17/21. ? ?Follow up: Palliative care will continue to follow for complex medical decision making, advance care planning, and clarification of goals. Return 6 weeks or prn. Encouraged to call provider sooner with any concerns.  ? ?Family /Caregiver/Community Supports: Patient lives at home with her daughter April. She coordinates patient's care. Strong family support system identified.  ? ?HOSPICE ELIGIBILITY/DIAGNOSIS: TBD ? ?Chief Complaint: Initial Palliative care visit ? ?HISTORY OF PRESENT ILLNESS:  Jocelyn Shelton is a 66 y.o. year old female  with multiple morbidities requiring close monitoring and with high risk of complications and  mortality: Dementia, COPD, Hypertension, hyperlipidemia, CAD, GERD, MDD. Patient was hospitalized 1/6-1/17/23 with altered mental status related to dehydration, treated and discharged to SNF for acute rehab; recently home from SNF. She denies pain/discomfort, endorses weakness.  ?History obtained from review of EMR, discussion with primary team, caregiver, family and/or Ms. Orzechowski.  ?Review and summarization of Epic records shows history from other than patient. Rest of 10 point ROS asked  and negative.  ?Independent interpretation of tests  and reviewed as needed, available labs, patient records, imaging, studies and related documents from the EMR. ? ? ?PAST MEDICAL HISTORY:  ?Active Ambulatory Problems  ?  Diagnosis Date Noted  ? Major depressive disorder, recurrent episode ([place]) 09/23/2006  ? Tobacco use disorder 09/23/2006  ? Hypertension 09/23/2006  ? GERD (gastroesophageal reflux disease) 08/02/2008  ? Hypokalemia 02/28/2013  ? Rheumatoid arthritis ([place]) 03/14/2013  ? COPD (chronic obstructive pulmonary disease) ([place]) 08/30/2015  ? Scleroderma ([place]) 03/08/2016  ? Immunosuppressed status ([place])   ? Coronary artery disease involving native coronary artery of native heart without angina pectoris   ? Pulmonary hypertension ([place]), Possible 06/17/2017  ? Centrilobular emphysema ([place]) 07/29/2017  ? ILD (interstitial lung disease) ([place]) 07/29/2017  ? Abnormality of heart beat 07/07/2019  ? 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 Memory difficulty 07/30/2021  ? Alteration of awareness 07/31/2021  ? AMS (altered mental status) 08/02/2021  ? Moderate dementia ([place])   ? CKD (chronic kidney disease), stage II   ? ?Resolved Ambulatory Problems  ?  Diagnosis Date Noted  ? PSYCHOSIS, UNSPECIFIED 09/23/2006  ? Raynauds syndrome 09/23/2006  ? CLAUDICATION, INTERMITTENT 09/23/2006  ? Callus of foot 03/18/2007  ? OSTEOARTHRITIS, MULTI SITES 09/23/2006  ? Contact dermatitis 03/10/2011  ? Erythema nodosum 04/27/2011  ? Lung mass 04/27/2011  ? 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 Cough 11/24/2011  ? Facial rash 02/02/2012  ? Sinusitis 09/28/2012  ? Hypertension, poor control 09/29/2012  ? Claudication ([place]) 11/07/2012  ? Diarrhea 02/28/2013  ? Abdominal cramping 02/28/2013  ? Dehydration 02/28/2013  ? Anemia 03/01/2013  ? Other pancytopenia ([place]) 03/01/2013  ? Thrombocytopenia, unspecified ([place]) 03/01/2013  ? Anemia 03/02/2013  ? Preventative health care 03/26/2014  ? Chronic foot pain 03/26/2014   ? Plantar wart of right foot 03/26/2014  ? Allergic rhinitis 12/03/2014  ? Tendinitis of right rotator cuff 12/03/2014  ? Chronic pain syndrome 12/03/2014  ? Pleural effusion 02/08/2015  ? Tachycardia   ? Chest pain   ? Hypoxia   ? Abdominal pain, acute   ? Volume overload   ? NSTEMI (non-ST elevated myocardial infarction) ([place])   ? Hypertensive emergency   ? Coronary artery disease involving native heart without angina pectoris 05/23/2015  ? S/P angioplasty with stent, 05/22/15 pRCA DES and pLCX DES 05/23/2015  ? CAP (community acquired pneumonia) 05/23/2015  ? Adverse drug reaction 05/23/2015  ? Upper airway cough syndrome 08/30/2015  ? Syncope 03/08/2016  ? Hyponatremia 03/08/2016  ? Herpes zoster 03/08/2016  ? Shingles   ? Fever   ? Foot ulcer, right ([place])   ? FUO (fever of unknown origin)   ? Claudication in peripheral vascular disease ([place]) 10/28/2016  ? Hyponatremia 03/16/2017  ? Chest pain 03/17/2017  ? Elevated troponin 03/17/2017  ? Generalized weakness   ? Chest pain 06/16/2017  ? Near syncope 06/17/2017  ? Hypoalbuminemia due to protein-calorie malnutrition ([place]) 06/17/2017  ? Leg pain, diffuse 06/17/2017  ? Dilation of esophagus 06/17/2017  ? Bronchiectasis ([place]) 06/17/2017  ? Avascular necrosis of humeral head, right ([place]) 06/17/2017  ? Syncope 06/17/2017  ? Cigarette smoker 02/04/2018  ? Polypharmacy 09/08/2018  ? ?Past Medical History:  ?Diagnosis Date  ? Avascular necrosis of humeral head, left ([place]) 06/17/2017  ? CAD in native artery 05/23/2015  ? High cholesterol   ? Leg pain   ? Myocardial infarction ([place]) 04/2015  ? Reflux   ? ? ?SOCIAL HX:  ?Social History  ? ?Tobacco Use  ? Smoking status:  Every Day  ?  Packs/day: 0.50  ?  Years: 46.00  ?  Pack years: 23.00  ?  Types: Cigarettes  ? Smokeless tobacco: Never  ?Substance Use Topics  ? Alcohol use: Yes  ?  Alcohol/week: 4.0 standard drinks  ?  Types: 4 Standard drinks or equivalent per week  ? ?  ?FAMILY HX:  ?Family History  ?Problem Relation  Age of Onset  ? Hypertension Father   ? Heart attack Mother   ? Diabetes Daughter   ? Diabetes Sister   ? Lung cancer Brother 72  ?   ? ?ALLERGIES:  ?Allergies  ?Allergen Reactions  ? Ticagrelor Shortness Of Faith

## 2021-11-13 NOTE — Assessment & Plan Note (Signed)
-   Continue to follow with palliative care ?-Incontinence supplies ordered ?

## 2021-11-13 NOTE — Progress Notes (Signed)
? ? ?  SUBJECTIVE:  ? ?CHIEF COMPLAINT / HPI:  ? ?Dementia ?Last seen on 10/27/2021 for hospital f/u after being hospitalized for dementia. Daughter is care giver and trying to get POA and is continuing to pursue palliative care for her mother and had palliative meeting through mychart earlier today which daughter states went well.  ? ?Daughters admits to financial stress b/c  pt's social security money is being held because they think she is still in a nursing facility. Daughter is working on fixing this issue.  ? ?Daughter states pt has not been sleeping well since leaving nursing facility, stays up watching TV but is still busy and active during the day.  Is currently taking trazodone 50 mg nightly which patient was prescribed from  nursing facility. ? ?Cough ?Dry cough for past couple months which happens several times throughout the day. Daughter is unsure what is triggering it but states pt does have history of allergies. No congestion, no fevers. Pt does have COPD and sees a pulmonologist, last saw on 11/13/20 and has apt on Monday.  ? ?PERTINENT  PMH / PSH: Dementia, COPD ? ?OBJECTIVE:  ? ?Vitals:  ? 11/13/21 1449  ?BP: 137/82  ?Pulse: 79  ?SpO2: 99%  ? ? ? ?General: NAD, pleasant, able to participate in exam ?Cardiac: RRR, no murmurs. ?Respiratory: CTAB, normal effort, No wheezes, rales or rhonchi ?Abdomen: Bowel sounds present, nontender, nondistended, soft ?Extremities: no edema or cyanosis. ?Skin: warm and dry ?Neuro: alert, nonverbal except answering "no" inappropriately to two questions ?Psych: Pleasant demeanor ? ?ASSESSMENT/PLAN:  ?Moderate dementia ?- Continue to follow with palliative care ?-Incontinence supplies ordered ? ?Insomnia ?Likely related to diagnosis of dementia ?-Discontinue trazodone ?-Start doxepin 6 mg nightly ?-Return in 2 weeks for follow-up ? ?Chronic cough ?Dry chronic cough could potentially be due to postnasal drip.  Per daughter, patient also has history of allergies.  Will try  Flonase.  Advised daughter to make sure pulmonologist knows about this chronic cough. ?-Flonase 1 spray into each nostril daily ?-Return in 2 weeks for follow-up ?  ? ? ?Dr. Precious Gilding, DO ?[place]  ? ? ? ? ?

## 2021-11-13 NOTE — Telephone Encounter (Signed)
Community message sent to DME team at [facility].  Bashir Marchetti,CMA ? ?

## 2021-11-13 NOTE — Assessment & Plan Note (Addendum)
Likely related to diagnosis of dementia ?- Discontinue trazodone ?-Start doxepin 6 mg nightly ?-Return in 2 weeks for follow-up ?

## 2021-11-13 NOTE — Patient Instructions (Signed)
It was great to see you! Thank you for allowing me to participate in your care! ? ?I recommend that you always bring your medications to each appointment as this makes it easy to ensure you are on the correct medications and helps [place] not miss when refills are needed. ? ?Our plans for today:  ?- I have ordered incontinence supplies that should be shipped to your home ?- I sent in a prescription for flonase nasal spray. If cough continues, please let us know ?-I have discontinued Trazodone and have ordered a different sleeping medication called doxepin.  ?-Please return in 2 weeks to follow up on how you are doing on the flonase and doxepin and to check in ? ? ?Take care and seek immediate care sooner if you develop any concerns.  ? ?Dr. Erick Alley, DO ?[facility] ? ?

## 2021-11-13 NOTE — Telephone Encounter (Signed)
Incontinence supplies ordered and routed to RN team  ?

## 2021-11-17 NOTE — Progress Notes (Signed)
? ?Cardiology Office Note ? ? ?Date:  11/19/2021  ? ?ID:  Jocelyn Shelton , DOB 06/19/1956, MRN [id number] ? ?PCP:  Jocelyn KindsSowell, Brandon, MD  ?Cardiologist:   Jocelyn PatesPaula Marcellino Fidalgo, MD  ? ?F/U of CAD  ? ?  ?History of Present Illness: ? Jocelyn FernsM  is a 66 y.o. female with a history of CAD (s/p NSTEMI in 2016:  Cath pt underwent PCI/DES to P RCA; PCI/DES to LCx)  Also a history of HTN, discoid lupus  SOB, hyponatremia  And syncope (felt orthostatic)  Echo in Nov 2018 showed normal LVEF   No signif valve abnormalities   ? ?I saw the pt in Fall 2021  The pt lives with daughter    Nonverbal due to dementia   Palliative care involved  Patient is DNR ? ?Per the patient's daughter the patient does not appear to hve CP  Doing very little acitvity    Wt is down  Not eating as much    Note taht patient fell this morning   Tripped     ? ?Current Meds  ?Medication Sig  ? acetaminophen (TYLENOL) 500 MG tablet Take 1,000 mg by mouth every 6 (six) hours as needed for moderate pain or headache.  ? albuterol (PROVENTIL HFA) 108 (90 Base) MCG/ACT inhaler INHALE TWO PUFFS BY MOUTH EVERY 4 HOURS AS NEEDED FOR WHEEZING FOR COUGH FOR SHORTNESS OF BREATH (Patient taking differently: Inhale 2 puffs into the lungs every 4 (four) hours as needed for wheezing or shortness of breath.)  ? aspirin EC 81 MG EC tablet Take 1 tablet (81 mg total) by mouth daily.  ? Calcium Carb-Cholecalciferol (CALCIUM 600+D3 PO) Take 1 tablet by mouth daily.  ? carvedilol (COREG) 25 MG tablet TAKE 1 TABLET (25 MG TOTAL) BY MOUTH 2 (TWO) TIMES DAILY WITH A MEAL.  ? diltiazem (CARDIZEM CD) 180 MG 24 hr capsule TAKE 1 CAPSULE BY MOUTH EVERY DAY (Patient taking differently: Take 180 mg by mouth daily.)  ? Doxepin HCl 6 MG TABS Take 1 tablet (6 mg total) by mouth at bedtime.  ? fluticasone (FLONASE) 50 MCG/ACT nasal spray Place 1 spray into both nostrils daily. 1 spray in each nostril every day  ? folic acid (FOLVITE) 1 MG tablet TAKE 1 TABLET (1 MG TOTAL) BY MOUTH DAILY. (Patient  taking differently: Take 1 mg by mouth daily.)  ? memantine (NAMENDA) 10 MG tablet Take 10 mg by mouth 2 (two) times daily.  ? Multiple Vitamin (MULTIVITAMIN WITH MINERALS) TABS tablet Take 1 tablet by mouth daily.  ? nitroGLYCERIN (NITROSTAT) 0.4 MG SL tablet Place 1 tablet (0.4 mg total) under the tongue every 5 (five) minutes as needed for chest pain. Please schedule appointment for future refills.  ? omeprazole (PRILOSEC) 40 MG capsule Take 40 mg by mouth every evening.  ? predniSONE (DELTASONE) 1 MG tablet Take 3 mg by mouth daily with breakfast.  ? sulfaSALAzine (AZULFIDINE) 500 MG tablet Take 2 tablets (1,000 mg total) by mouth 2 (two) times daily.  ? umeclidinium-vilanterol (ANORO ELLIPTA) 62.5-25 MCG/ACT AEPB Inhale 1 puff into the lungs daily.  ? ? ? ?Allergies:   Ticagrelor, Cymbalta [duloxetine hcl], Lisinopril, Olanzapine, and Tomato  ? ?Past Medical History:  ?Diagnosis Date  ? 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 Avascular necrosis of humeral head, right (HCC) 06/17/2017  ? Bronchiectasis (HCC) 06/17/2017  ? CAD in native artery 05/23/2015  ? High cholesterol   ? Hypertension   ? Leg pain   ?  ABIs 1/18: R 1.2, L 1.1  ? Myocardial infarction (HCC) 04/2015  ? Raynauds syndrome 09/23/2006  ? Qualifier: Diagnosis of  By: Moreno-Coll  MD, Adlih    ? Reflux   ? Rheumatoid arthritis (HCC)   ? "all over" (06/16/2017)  ? Scleroderma (HCC)   ? ? ?Past Surgical History:  ?Procedure Laterality Date  ? APPENDECTOMY  90's  ? CARDIAC CATHETERIZATION N/A 05/22/2015  ? Procedure: Left Heart Cath and Coronary Angiography;  Surgeon: Lennette Bihari, MD;  Location: [facility];  Service: Cardiovascular;  Laterality: N/A;  ? CARDIAC CATHETERIZATION N/A 05/22/2015  ? Procedure: Coronary Stent Intervention;  Surgeon: Lennette Bihari, MD;  Location: [facility]VAS[facility] CV [facility];  Service: Cardiovascular;  Laterality: N/A;  ? ? ? ?Social History:  The patient  reports that she has been smoking cigarettes.  She has a 23.00 pack-year smoking history. She has never used smokeless tobacco. She reports current alcohol use of about 4.0 standard drinks per week. She reports that she does not use drugs.  ? ?Family History:  The patient's family history includes Diabetes in her daughter and sister; Heart attack in her mother; Hypertension in her father; Lung cancer (age of onset: 43) in her brother.  ? ? ?ROS:  Please see the history of present illness. All other systems are reviewed and  Negative to the above problem except as noted.  ? ? ?PHYSICAL EXAM: ?VS:  BP 110/72   Pulse 76   Ht 5\' 6"  (1.676 Shelton)   Wt 140 lb 6.4 oz (63.7 kg)   SpO2 99%   BMI 22.66 kg/Shel[place]?   ?GEN: Well nourished, well developed, in no acute distress  ?HEENT: normal  ?Neck:  JVP is normal    No carotid bruits  ?Cardiac: RRR; no murmurs;  No LE  edema  ?Respiratory CTA   ?GI: soft, nontender, nondistended, + BS  No hepatomegaly  ?MS: no deformity Moving all extremities   ?Skin: warm and dry,    ?Neuro:  Strength and sensation are intact ?Psych: euthymic mood, full affect ? ? ?EKG:  EKG is not ordered today. ? ? ?Lipid Panel ?   ?Component Value Date/Time  ? CHOL 105 04/26/2020 1231  ? TRIG 104 04/26/2020 1231  ? HDL 40 04/26/2020 1231  ? CHOLHDL 2.6 04/26/2020 1231  ? CHOLHDL 2.5 06/17/2017 0559  ? VLDL 8 06/17/2017 0559  ? LDLCALC 45 04/26/2020 1231  ? ?  ? ?Wt Readings from Last 3 Encounters:  ?11/19/21 140 lb 6.4 oz (63.7 kg)  ?11/13/21 142 lb (64.4 kg)  ?10/27/21 145 lb 3.2 oz (65.9 kg)  ?  ? ? ?ASSESSMENT AND PLAN: ? ?1  CAD    Pt appears comfortable    Discussed what patients daughter how to watch patient if patient appears uncomfortable /agitated     ? ?2  HTN   BP is controlled  Need to follow   IF falls/dizzy check BP ? ?3  HL  Check lipdis today   ? ?4   Hx of Hyponatremia   Check labs  ? ?5  Tob abuse   Counselled on cessation ? ?F/U this fall ? ?Current medicines are reviewed at length with the patient today.  The patient does not have  concerns regarding medicines. ? ?Signed, ?Jocelyn Pates, MD  ?11/19/2021 12:57 PM    ?[facility] ?[address] [place], [place], [place]  [address] ?Phone: [phone]; Fax: [phone]  ? ? ?

## 2021-11-19 ENCOUNTER — Encounter: Payer: Self-pay | Admitting: Internal Medicine

## 2021-11-19 ENCOUNTER — Ambulatory Visit (INDEPENDENT_AMBULATORY_CARE_PROVIDER_SITE_OTHER): Payer: Medicare Other | Admitting: Internal Medicine

## 2021-11-19 VITALS — BP 110/72 | HR 76 | Ht 66.0 in | Wt 140.4 lb

## 2021-11-19 DIAGNOSIS — I2510 Atherosclerotic heart disease of native coronary artery without angina pectoris: Secondary | ICD-10-CM

## 2021-11-19 DIAGNOSIS — Z79899 Other long term (current) drug therapy: Secondary | ICD-10-CM

## 2021-11-19 DIAGNOSIS — E785 Hyperlipidemia, unspecified: Secondary | ICD-10-CM

## 2021-11-19 NOTE — Patient Instructions (Signed)
Medication Instructions:  ? ?*If you need a refill on your cardiac medications before your next appointment, please call your pharmacy* ? ? ?Lab Work: ?CBC, CMET,LIPID ? ?If you have labs (blood work) drawn today and your tests are completely normal, you will receive your results only by: ?MyChart Message (if you have MyChart) OR ?A paper copy in the mail ?If you have any lab test that is abnormal or we need to change your treatment, we will call you to review the results. ? ? ?Testing/Procedures: ?NONE ? ? ?Follow-Up: ?At [facility], you and your health needs are our priority.  As part of our continuing mission to provide you with exceptional heart care, we have created designated Provider Care Teams.  These Care Teams include your primary Cardiologist (physician) and Advanced Practice Providers (APPs -  Physician Assistants and Nurse Practitioners) who all work together to provide you with the care you need, when you need it. ? ?We recommend signing up for the patient portal called "MyChart".  Sign up information is provided on this After Visit Summary.  MyChart is used to connect with patients for Virtual Visits (Telemedicine).  Patients are able to view lab/test results, encounter notes, upcoming appointments, etc.  Non-urgent messages can be sent to your provider as well.   ?To learn more about what you can do with MyChart, go to [email or web address].   ? ?Your next appointment:   ?9 month(s) ? ?The format for your next appointment:   ?In Person ? ?Provider:   ?Dorris Carnes, MD   ? ? ?Other Instructions ? ? ?Important Information About Sugar ? ? ? ? ?  ?

## 2021-11-20 LAB — COMPREHENSIVE METABOLIC PANEL
ALT: 15 IU/L (ref 0–32)
AST: 23 IU/L (ref 0–40)
Albumin/Globulin Ratio: 1.1 — ABNORMAL LOW (ref 1.2–2.2)
Albumin: 4 g/dL (ref 3.8–4.8)
Alkaline Phosphatase: 96 IU/L (ref 44–121)
BUN/Creatinine Ratio: 14 (ref 12–28)
BUN: 16 mg/dL (ref 8–27)
Bilirubin Total: 0.2 mg/dL (ref 0.0–1.2)
CO2: 26 mmol/L (ref 20–29)
Calcium: 9.2 mg/dL (ref 8.7–10.3)
Chloride: 100 mmol/L (ref 96–106)
Creatinine, Ser: 1.11 mg/dL — ABNORMAL HIGH (ref 0.57–1.00)
Globulin, Total: 3.5 g/dL (ref 1.5–4.5)
Glucose: 115 mg/dL — ABNORMAL HIGH (ref 70–99)
Potassium: 4.1 mmol/L (ref 3.5–5.2)
Sodium: 139 mmol/L (ref 134–144)
Total Protein: 7.5 g/dL (ref 6.0–8.5)
eGFR: 55 mL/min/{1.73_m2} — ABNORMAL LOW (ref >59)

## 2021-11-20 LAB — CBC
Hematocrit: 33.4 % — ABNORMAL LOW (ref 34.0–46.6)
Hemoglobin: 10.8 g/dL — ABNORMAL LOW (ref 11.1–15.9)
MCH: 30 pg (ref 26.6–33.0)
MCHC: 32.3 g/dL (ref 31.5–35.7)
MCV: 93 fL (ref 79–97)
Platelets: 213 10*3/uL (ref 150–450)
RBC: 3.6 x10E6/uL — ABNORMAL LOW (ref 3.77–5.28)
RDW: 12.8 % (ref 11.7–15.4)
WBC: 7.8 10*3/uL (ref 3.4–10.8)

## 2021-11-20 LAB — LIPID PANEL
Chol/HDL Ratio: 4 ratio (ref 0.0–4.4)
Cholesterol, Total: 163 mg/dL (ref 100–199)
HDL: 41 mg/dL (ref >39)
LDL Chol Calc (NIH): 102 mg/dL — ABNORMAL HIGH (ref 0–99)
Triglycerides: 110 mg/dL (ref 0–149)
VLDL Cholesterol Cal: 20 mg/dL (ref 5–40)

## 2021-11-24 ENCOUNTER — Telehealth: Payer: Self-pay

## 2021-11-24 DIAGNOSIS — E785 Hyperlipidemia, unspecified: Secondary | ICD-10-CM

## 2021-11-24 DIAGNOSIS — Z79899 Other long term (current) drug therapy: Secondary | ICD-10-CM

## 2021-11-24 MED ORDER — ROSUVASTATIN CALCIUM 20 MG PO TABS: 20.0000 mg | ORAL_TABLET | Freq: Every day | ORAL | 3 refills | Status: DC

## 2021-11-24 NOTE — Telephone Encounter (Signed)
-----   Message from Pricilla Riffle, MD sent at 11/20/2021 11:45 PM EDT ----- ?LDL needs to be lower given CAD     ?Start Crestor 20 mg    Follow up lpids in 8 wks ?CBC is down from Jan  Minimal    ?Follow up CBC when has lipids  ?Kidney function, liver function and electrolytes are OK ?

## 2021-11-24 NOTE — Telephone Encounter (Signed)
MyChart message sent to the pt.

## 2021-11-26 ENCOUNTER — Telehealth: Payer: Self-pay

## 2021-11-26 DIAGNOSIS — E785 Hyperlipidemia, unspecified: Secondary | ICD-10-CM

## 2021-11-26 DIAGNOSIS — I10 Essential (primary) hypertension: Secondary | ICD-10-CM

## 2021-11-26 DIAGNOSIS — D649 Anemia, unspecified: Secondary | ICD-10-CM

## 2021-11-26 DIAGNOSIS — Z79899 Other long term (current) drug therapy: Secondary | ICD-10-CM

## 2021-11-26 DIAGNOSIS — I2510 Atherosclerotic heart disease of native coronary artery without angina pectoris: Secondary | ICD-10-CM

## 2021-11-26 MED ORDER — DILTIAZEM HCL ER COATED BEADS 180 MG PO CP24: 180.0000 mg | ORAL_CAPSULE | Freq: Every day | ORAL | 3 refills | Status: DC

## 2021-11-26 MED ORDER — CARVEDILOL 25 MG PO TABS: 25.0000 mg | ORAL_TABLET | Freq: Two times a day (BID) | ORAL | 3 refills | Status: AC

## 2021-11-26 NOTE — Telephone Encounter (Signed)
-----   Message from Pricilla Riffle, MD sent at 11/20/2021 11:45 PM EDT ----- ?LDL needs to be lower given CAD     ?Start Crestor 20 mg    Follow up lpids in 8 wks ?CBC is down from Jan  Minimal    ?Follow up CBC when has lipids  ?Kidney function, liver function and electrolytes are OK ?

## 2021-11-26 NOTE — Telephone Encounter (Signed)
Pts daughter advised the pts lab results and pt to have labs 02/03/22.  ?

## 2021-11-27 ENCOUNTER — Encounter: Payer: Self-pay | Admitting: Student

## 2021-11-27 ENCOUNTER — Ambulatory Visit (INDEPENDENT_AMBULATORY_CARE_PROVIDER_SITE_OTHER): Payer: Medicare Other | Admitting: Student

## 2021-11-27 VITALS — BP 125/86 | HR 86 | Wt 140.8 lb

## 2021-11-27 DIAGNOSIS — R053 Chronic cough: Secondary | ICD-10-CM

## 2021-11-27 DIAGNOSIS — F03B0 Unspecified dementia, moderate, without behavioral disturbance, psychotic disturbance, mood disturbance, and anxiety: Secondary | ICD-10-CM

## 2021-11-27 DIAGNOSIS — G4700 Insomnia, unspecified: Secondary | ICD-10-CM

## 2021-11-27 MED ORDER — MEMANTINE HCL 10 MG PO TABS: 10.0000 mg | ORAL_TABLET | Freq: Two times a day (BID) | ORAL | 5 refills | Status: DC

## 2021-11-27 NOTE — Assessment & Plan Note (Addendum)
Patient remains with issues with skin breakdown related to incontinence of stool and bladder.  Having difficulty accessing incontinent supplies. ?-DME order for incontinence supplies to be faxed to home health agency ?-Namenda refilled per family request ?

## 2021-11-27 NOTE — Progress Notes (Signed)
? ? ?  SUBJECTIVE:  ? ?CHIEF COMPLAINT / HPI:  ? ?[name] is a 66 year old female with a history of coronary artery disease, prior NSTEMI, advanced dementia who presents today for a follow-up visit. ? ?Insomnia and Cough ?She was seen by Dr. Yetta Barre a few weeks ago with complaints of insomnia and cough.  Dr. Marlyne Beards transitioned her off of trazodone onto doxepin for sleep and started her on Flonase for cough presumed to be associated with postnasal drip.  In the intervening weeks, both her insomnia and her cough are much improved per her daughter's report.  She has been otherwise well.  She was recently seen by her cardiologist and did have a fall on her way into the doctor's office.  She was evaluated by medical staff at the scene and did not have any acute injuries.  She has had no pain or sequela from the fall. ? ?Dementia ?Per her daughter's report, the patient's dementia remains relatively stable.  She will occasionally answer questions with 1-2 word answers but is otherwise nonverbal.  She is incontinent of stool and urine.  Her daughter has a difficult time affording incontinence supplies.  Is asking about getting the supplies through her home health agency and paid for by Medicare/Medicaid today.  Patient is unable to safely ambulate to the bathroom due to her dementia and has skin breakdown when proper incontinence supplies are not used. ?She needs a refill of her Namenda today. ? ? ?OBJECTIVE:  ? ?BP 125/86   Pulse 86   Wt 140 lb 12.8 oz (63.9 kg)   BMI 22.73 kg/m?   ?Physical Exam ?Constitutional:   ?   General: She is not in acute distress. ?   Comments: Minimally engaged, only occasionally answers questions with one or two word answers. At mental status baseline.   ?Cardiovascular:  ?   Rate and Rhythm: Normal rate and regular rhythm.  ?Pulmonary:  ?   Effort: Pulmonary effort is normal.  ?   Breath sounds: Normal breath sounds.  ?Skin: ?   General: Skin is warm and dry.  ?Neurological:  ?   Mental  Status: Mental status is at baseline.  ? ? ? ?ASSESSMENT/PLAN:  ? ?Chronic cough ?Much improved with the addition of Flonase at her last visit. ?-Continue Flonase ? ?Insomnia ?Sleeping much better with the transition from trazodone to doxepin.  Will plan to continue doxepin at this time. ? ?Moderate dementia ?Patient remains with issues with skin breakdown related to incontinence of stool and bladder.  Having difficulty accessing incontinent supplies. ?-DME order for incontinence supplies to be faxed to home health agency ?-Namenda refilled per family request ?  ? ? ?Jocelyn Gibbs, MD ?[facility]  ?

## 2021-11-27 NOTE — Assessment & Plan Note (Signed)
Sleeping much better with the transition from trazodone to doxepin.  Will plan to continue doxepin at this time. ?

## 2021-11-27 NOTE — Patient Instructions (Signed)
Ms. Marcea, ? ?I am so glad that the doxepin and Flonase are working for you!  ?I am refilling your Namenda today. Also, I will re-place the order for your incontinence supplies. If we continue to have issues getting them paid for, your daughter can call our clinic and let us know and we will follow up on that.  ? ?Dr. Marisue Humble ?

## 2021-11-27 NOTE — Assessment & Plan Note (Signed)
Much improved with the addition of Flonase at her last visit. ?-Continue Flonase ?

## 2021-12-10 ENCOUNTER — Encounter (HOSPITAL_COMMUNITY): Payer: Self-pay

## 2021-12-10 ENCOUNTER — Emergency Department (HOSPITAL_COMMUNITY): Payer: Medicare Other

## 2021-12-10 ENCOUNTER — Other Ambulatory Visit: Payer: Self-pay

## 2021-12-10 ENCOUNTER — Emergency Department (HOSPITAL_COMMUNITY)
Admission: EM | Admit: 2021-12-10 | Discharge: 2021-12-10 | Disposition: A | Discharge status: Home / Self Care | Payer: Medicare Other | Attending: Emergency Medicine | Admitting: Emergency Medicine

## 2021-12-10 DIAGNOSIS — I2510 Atherosclerotic heart disease of native coronary artery without angina pectoris: Secondary | ICD-10-CM | POA: Diagnosis not present

## 2021-12-10 DIAGNOSIS — Z79899 Other long term (current) drug therapy: Secondary | ICD-10-CM | POA: Diagnosis not present

## 2021-12-10 DIAGNOSIS — R410 Disorientation, unspecified: Secondary | ICD-10-CM | POA: Diagnosis not present

## 2021-12-10 DIAGNOSIS — R55 Syncope and collapse: Secondary | ICD-10-CM | POA: Insufficient documentation

## 2021-12-10 DIAGNOSIS — R464 Slowness and poor responsiveness: Secondary | ICD-10-CM | POA: Diagnosis not present

## 2021-12-10 DIAGNOSIS — Z7982 Long term (current) use of aspirin: Secondary | ICD-10-CM | POA: Insufficient documentation

## 2021-12-10 DIAGNOSIS — R4189 Other symptoms and signs involving cognitive functions and awareness: Secondary | ICD-10-CM

## 2021-12-10 DIAGNOSIS — R531 Weakness: Secondary | ICD-10-CM | POA: Insufficient documentation

## 2021-12-10 DIAGNOSIS — I10 Essential (primary) hypertension: Secondary | ICD-10-CM | POA: Insufficient documentation

## 2021-12-10 LAB — MAGNESIUM: Magnesium: 1.9 mg/dL (ref 1.7–2.4)

## 2021-12-10 LAB — CBC WITH DIFFERENTIAL/PLATELET
Abs Immature Granulocytes: 0.07 10*3/uL (ref 0.00–0.07)
Basophils Absolute: 0 10*3/uL (ref 0.0–0.1)
Basophils Relative: 0 %
Eosinophils Absolute: 0.1 10*3/uL (ref 0.0–0.5)
Eosinophils Relative: 2 %
HCT: 38.2 % (ref 36.0–46.0)
Hemoglobin: 11.5 g/dL — ABNORMAL LOW (ref 12.0–15.0)
Immature Granulocytes: 1 %
Lymphocytes Relative: 10 %
Lymphs Abs: 0.6 10*3/uL — ABNORMAL LOW (ref 0.7–4.0)
MCH: 29.6 pg (ref 26.0–34.0)
MCHC: 30.1 g/dL (ref 30.0–36.0)
MCV: 98.2 fL (ref 80.0–100.0)
Monocytes Absolute: 0.4 10*3/uL (ref 0.1–1.0)
Monocytes Relative: 6 %
Neutro Abs: 4.8 10*3/uL (ref 1.7–7.7)
Neutrophils Relative %: 81 %
Platelets: 158 10*3/uL (ref 150–400)
RBC: 3.89 MIL/uL (ref 3.87–5.11)
RDW: 13 % (ref 11.5–15.5)
WBC: 6.1 10*3/uL (ref 4.0–10.5)
nRBC: 0 % (ref 0.0–0.2)

## 2021-12-10 LAB — BASIC METABOLIC PANEL
Anion gap: 7 (ref 5–15)
BUN: 9 mg/dL (ref 8–23)
CO2: 25 mmol/L (ref 22–32)
Calcium: 8.7 mg/dL — ABNORMAL LOW (ref 8.9–10.3)
Chloride: 108 mmol/L (ref 98–111)
Creatinine, Ser: 1.08 mg/dL — ABNORMAL HIGH (ref 0.44–1.00)
GFR, Estimated: 57 mL/min — ABNORMAL LOW (ref >60)
Glucose, Bld: 101 mg/dL — ABNORMAL HIGH (ref 70–99)
Potassium: 3.9 mmol/L (ref 3.5–5.1)
Sodium: 140 mmol/L (ref 135–145)

## 2021-12-10 LAB — URINALYSIS, ROUTINE W REFLEX MICROSCOPIC
Bilirubin Urine: NEGATIVE
Glucose, UA: NEGATIVE mg/dL
Hgb urine dipstick: NEGATIVE
Ketones, ur: NEGATIVE mg/dL
Nitrite: NEGATIVE
Protein, ur: NEGATIVE mg/dL
Specific Gravity, Urine: 1.004 — ABNORMAL LOW (ref 1.005–1.030)
pH: 7 (ref 5.0–8.0)

## 2021-12-10 LAB — TROPONIN I (HIGH SENSITIVITY)
Troponin I (High Sensitivity): 5 ng/L (ref <18)
Troponin I (High Sensitivity): 4 ng/L (ref <18)

## 2021-12-10 LAB — TSH: TSH: 4.397 u[IU]/mL (ref 0.350–4.500)

## 2021-12-10 MED ORDER — CEPHALEXIN 250 MG PO CAPS
500.0000 mg | ORAL_CAPSULE | Freq: Once | ORAL | Status: AC
Start: 2021-12-10 — End: 2021-12-10
  Administered 2021-12-10: 500 mg via ORAL
  Filled 2021-12-10: qty 2

## 2021-12-10 MED ORDER — CEPHALEXIN 500 MG PO CAPS: 500.0000 mg | ORAL_CAPSULE | Freq: Three times a day (TID) | ORAL | 0 refills | Status: DC

## 2021-12-10 NOTE — [place] FL2 (Signed)
?[place] MEDICAID FL2 LEVEL OF CARE SCREENING TOOL  ?  ? ?IDENTIFICATION  ?Patient Name: ?Jocelyn Shelton Birthdate: 16-Apr-1956 Sex: female Admission Date (Current Location): ?12/10/2021  ?[place] and [place] Number: ? [place] ?  Facility and Address:  ?[facility], [place] [address], [place], [place] [address] ?     Provider Number: ?[id number]  ?Attending Physician Name and Address:  ?Lajean Saver, MD ? Relative Name and Phone Number:  ?Tata, Rebar Daughter [phone] ?   ?Current Level of Care: ?Hospital Recommended Level of Care: ?Nursing Facility Prior Approval Number: ?  ? ?Date Approved/Denied: ?  PASRR Number: ?[id number] ? ?Discharge Plan: ?Other (Comment) (Ling-term care) ?  ? ?Current Diagnoses: ?Patient Active Problem List  ? Diagnosis Date Noted  ? Chronic cough 11/13/2021  ? Insomnia 11/13/2021  ? CKD (chronic kidney disease), stage II   ? AMS (altered mental status) 08/02/2021  ? Moderate dementia ([place])   ? Alteration of awareness 07/31/2021  ? Memory difficulty 07/30/2021  ? 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 Abnormality of heart beat 07/07/2019  ? Centrilobular emphysema ([place]) 07/29/2017  ? ILD (interstitial lung disease) ([place]) 07/29/2017  ? Pulmonary hypertension ([place]), Possible 06/17/2017  ? Coronary artery disease involving native coronary artery of native heart without angina pectoris   ? Scleroderma ([place]) 03/08/2016  ? Immunosuppressed status ([place])   ? COPD (chronic obstructive pulmonary disease) ([place]) 08/30/2015  ? Rheumatoid arthritis ([place]) 03/14/2013  ? Hypokalemia 02/28/2013  ? GERD (gastroesophageal reflux disease) 08/02/2008  ? Major depressive disorder, recurrent episode ([place]) 09/23/2006  ? Tobacco use disorder 09/23/2006  ? Hypertension 09/23/2006  ? ? ?Orientation RESPIRATION BLADDER Height & Weight   ?  ?Self, Time ? Normal Incontinent Weight:   ?Height:     ?BEHAVIORAL SYMPTOMS/MOOD NEUROLOGICAL BOWEL NUTRITION STATUS  ?    Incontinent Diet  (mechanically soft diet)  ?AMBULATORY STATUS COMMUNICATION OF NEEDS Skin   ?Limited Assist Verbally (delayed) Other (Comment) (scleroderma) ?  ?  ?  ?    ?     ?     ? ? ?Personal Care Assistance Level of Assistance  ?Bathing, Feeding, Dressing Bathing Assistance: Limited assistance ?Feeding assistance: Limited assistance ?Dressing Assistance: Limited assistance ?   ? ?Functional Limitations Info  ?Sight, Hearing, Speech Sight Info: Adequate ?Hearing Info: Adequate ?Speech Info: Impaired (delayed)  ? ? ?SPECIAL CARE FACTORS FREQUENCY  ?    ?  ?  ?  ?  ?  ?  ?   ? ? ?Contractures Contractures Info: Not present  ? ? ?Additional Factors Info  ?Code Status, Allergies Code Status Info: DNR ?Allergies Info: Ticagrelor,Olanzapine,Cymbalta, Lisonpril,Sulfasalazine, Tomato ?  ?  ?  ?   ? ?Current Medications (12/10/2021):  This is the current hospital active medication list ?No current facility-administered medications for this encounter.  ? ?Current Outpatient Medications  ?Medication Sig Dispense Refill  ? acetaminophen (TYLENOL) 500 MG tablet Take 1,000 mg by mouth every 6 (six) hours as needed for moderate pain or headache.    ? albuterol (PROVENTIL HFA) 108 (90 Base) MCG/ACT inhaler INHALE TWO PUFFS BY MOUTH EVERY 4 HOURS AS NEEDED FOR WHEEZING FOR COUGH FOR SHORTNESS OF BREATH (Patient taking differently: Inhale 2 puffs into the lungs every 4 (four) hours as needed for wheezing or shortness of breath.) 7 each 1  ? aspirin EC 81 MG EC tablet Take 1 tablet (81 mg total) by mouth daily.    ? Calcium Carb-Cholecalciferol (CALCIUM 600+D3 PO) Take  1 tablet by mouth daily.    ? carvedilol (COREG) 25 MG tablet Take 1 tablet (25 mg total) by mouth 2 (two) times daily with a meal. 180 tablet 3  ? cephALEXin (KEFLEX) 500 MG capsule Take 1 capsule (500 mg total) by mouth 3 (three) times daily. 15 capsule 0  ? diltiazem (CARDIZEM CD) 180 MG 24 hr capsule Take 1 capsule (180 mg total) by mouth daily. (Patient taking differently: Take  180 mg by mouth every evening.) 90 capsule 3  ? Doxepin HCl 6 MG TABS Take 1 tablet (6 mg total) by mouth at bedtime. 30 tablet 0  ? fluticasone (FLONASE) 50 MCG/ACT nasal spray Place 1 spray into both nostrils daily. 1 spray in each nostril every day (Patient taking differently: Place 1 spray into both nostrils daily.) 16 g 12  ? folic acid (FOLVITE) 1 MG tablet TAKE 1 TABLET (1 MG TOTAL) BY MOUTH DAILY. (Patient taking differently: Take 1 mg by mouth daily.) 30 tablet 6  ? memantine (NAMENDA) 10 MG tablet Take 1 tablet (10 mg total) by mouth 2 (two) times daily. 60 tablet 5  ? Multiple Vitamin (MULTIVITAMIN WITH MINERALS) TABS tablet Take 1 tablet by mouth daily.    ? nitroGLYCERIN (NITROSTAT) 0.4 MG SL tablet Place 1 tablet (0.4 mg total) under the tongue every 5 (five) minutes as needed for chest pain. Please schedule appointment for future refills. 25 tablet 0  ? omeprazole (PRILOSEC) 40 MG capsule Take 40 mg by mouth every evening.    ? predniSONE (DELTASONE) 1 MG tablet Take 1 mg by mouth daily with breakfast.    ? rosuvastatin (CRESTOR) 20 MG tablet Take 1 tablet (20 mg total) by mouth daily. (Patient taking differently: Take 20 mg by mouth every evening.) 90 tablet 3  ? sulfaSALAzine (AZULFIDINE) 500 MG tablet Take 2 tablets (1,000 mg total) by mouth 2 (two) times daily. (Patient taking differently: Take 1,000 mg by mouth 2 (two) times daily after a meal.) 120 tablet 0  ? umeclidinium-vilanterol (ANORO ELLIPTA) 62.5-25 MCG/ACT AEPB Inhale 1 puff into the lungs daily.    ? ? ? ?Discharge Medications: ?Please see discharge summary for a list of discharge medications. ? ?Relevant Imaging Results: ? ?Relevant Lab Results: ? ? ?Additional Information ?SS# [id number] Ht & Wt 5'6" 140 lbs ? ?Korbin Mapps, LCSW ? ? ? ? ?

## 2021-12-10 NOTE — Discharge Instructions (Addendum)
It was our pleasure to provide your ER care today - we hope that you feel better. ? ?Drink plenty of fluids/make sure to stay well hydrated. Get adequate nutrition. ? ?Your lab tests show a possible urine infection -  take antibiotic as prescribed.  ? ?Follow up closely with your primary care doctor - discuss possible working towards extended care facility placement with your doctor's office and family. ? ?Return to ER if worse, new symptoms, new/severe pain, chest pain, trouble breathing, fainting, fevers, or other concern.  ?

## 2021-12-10 NOTE — ED Provider Notes (Signed)
Signed out by Dr Wallace Cullens that plan is for d/c to home, to check ua, add abx rx for home if pos.  ? ?Ua w 6-10 wbc. ?possible uti. Keflex po. Rx for home.  ? ?Rec close pcp f/u. ? ?Return precautions provided. ? ? ?  ?Cathren Laine, MD ?12/10/21 1839 ? ?

## 2021-12-10 NOTE — [place] FL2 (Signed)
?[place] MEDICAID FL2 LEVEL OF CARE SCREENING TOOL  ?  ? ?IDENTIFICATION  ?Patient Name: ?Jocelyn Shelton Birthdate: 10-May-1956 Sex: female Admission Date (Current Location): ?12/10/2021  ?[place] and [place] Number: ? [place] ?  Facility and Address:  ?[facility], [place] [address] [place], [place] [address] ?     Provider Number: ?[id number]  ?Attending Physician Name and Address:  ?Lajean Saver, MD ? Relative Name and Phone Number:  ?Ayania, Whitehair Daughter [phone] ?   ?Current Level of Care: ?Hospital Recommended Level of Care: ?Memory Care, [place] Prior Approval Number: ?  ? ?Date Approved/Denied: ?  PASRR Number: ?[id number] ? ?Discharge Plan: ?Other (Comment) (memory care) ?  ? ?Current Diagnoses: ?Patient Active Problem List  ? Diagnosis Date Noted  ? Chronic cough 11/13/2021  ? Insomnia 11/13/2021  ? CKD (chronic kidney disease), stage II   ? AMS (altered mental status) 08/02/2021  ? 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 Alteration of awareness 07/31/2021  ? Memory difficulty 07/30/2021  ? 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 Abnormality of heart beat 07/07/2019  ? Centrilobular emphysema ([place]) 07/29/2017  ? ILD (interstitial lung disease) ([place]) 07/29/2017  ? Pulmonary hypertension ([place]), Possible 06/17/2017  ? Coronary artery disease involving native coronary artery of native heart without angina pectoris   ? Scleroderma ([place]) 03/08/2016  ? Immunosuppressed status ([place])   ? COPD (chronic obstructive pulmonary disease) ([place]) 08/30/2015  ? Rheumatoid arthritis ([place]) 03/14/2013  ? Hypokalemia 02/28/2013  ? GERD (gastroesophageal reflux disease) 08/02/2008  ? Major depressive disorder, recurrent episode ([place]) 09/23/2006  ? Tobacco use disorder 09/23/2006  ? Hypertension 09/23/2006  ? ? ?Orientation RESPIRATION BLADDER Height & Weight   ?  ?Self, Place ? Normal Incontinent Weight:   ?Height:     ?BEHAVIORAL SYMPTOMS/MOOD NEUROLOGICAL BOWEL NUTRITION STATUS  ?     Incontinent Diet (mechanicaaly soft diet)  ?AMBULATORY STATUS COMMUNICATION OF NEEDS Skin   ?Limited Assist Verbally (delay) Other (Comment) (scleroderma) ?  ?  ?  ?    ?     ?     ? ? ?Personal Care Assistance Level of Assistance  ?Bathing, Feeding, Dressing Bathing Assistance: Limited assistance ?Feeding assistance: Limited assistance ?Dressing Assistance: Limited assistance ?   ? ?Functional Limitations Info  ?Sight, Hearing, Speech Sight Info: Adequate ?Hearing Info: Adequate ?Speech Info: Impaired (delay)  ? ? ?SPECIAL CARE FACTORS FREQUENCY  ?    ?  ?  ?  ?  ?  ?  ?   ? ? ?Contractures Contractures Info: Not present  ? ? ?Additional Factors Info  ?Code Status Code Status Info: DNR ?  ?  ?  ?  ?   ? ?Current Medications (12/10/2021):  This is the current hospital active medication list ?No current facility-administered medications for this encounter.  ? ?Current Outpatient Medications  ?Medication Sig Dispense Refill  ? acetaminophen (TYLENOL) 500 MG tablet Take 1,000 mg by mouth every 6 (six) hours as needed for moderate pain or headache.    ? albuterol (PROVENTIL HFA) 108 (90 Base) MCG/ACT inhaler INHALE TWO PUFFS BY MOUTH EVERY 4 HOURS AS NEEDED FOR WHEEZING FOR COUGH FOR SHORTNESS OF BREATH (Patient taking differently: Inhale 2 puffs into the lungs every 4 (four) hours as needed for wheezing or shortness of breath.) 7 each 1  ? aspirin EC 81 MG EC tablet Take 1 tablet (81 mg total) by mouth daily.    ? Calcium Carb-Cholecalciferol (CALCIUM 600+D3 PO) Take 1  tablet by mouth daily.    ? carvedilol (COREG) 25 MG tablet Take 1 tablet (25 mg total) by mouth 2 (two) times daily with a meal. 180 tablet 3  ? diltiazem (CARDIZEM CD) 180 MG 24 hr capsule Take 1 capsule (180 mg total) by mouth daily. (Patient taking differently: Take 180 mg by mouth every evening.) 90 capsule 3  ? Doxepin HCl 6 MG TABS Take 1 tablet (6 mg total) by mouth at bedtime. 30 tablet 0  ? fluticasone (FLONASE) 50 MCG/ACT nasal spray Place 1  spray into both nostrils daily. 1 spray in each nostril every day (Patient taking differently: Place 1 spray into both nostrils daily.) 16 g 12  ? folic acid (FOLVITE) 1 MG tablet TAKE 1 TABLET (1 MG TOTAL) BY MOUTH DAILY. (Patient taking differently: Take 1 mg by mouth daily.) 30 tablet 6  ? memantine (NAMENDA) 10 MG tablet Take 1 tablet (10 mg total) by mouth 2 (two) times daily. 60 tablet 5  ? Multiple Vitamin (MULTIVITAMIN WITH MINERALS) TABS tablet Take 1 tablet by mouth daily.    ? nitroGLYCERIN (NITROSTAT) 0.4 MG SL tablet Place 1 tablet (0.4 mg total) under the tongue every 5 (five) minutes as needed for chest pain. Please schedule appointment for future refills. 25 tablet 0  ? omeprazole (PRILOSEC) 40 MG capsule Take 40 mg by mouth every evening.    ? predniSONE (DELTASONE) 1 MG tablet Take 1 mg by mouth daily with breakfast.    ? rosuvastatin (CRESTOR) 20 MG tablet Take 1 tablet (20 mg total) by mouth daily. (Patient taking differently: Take 20 mg by mouth every evening.) 90 tablet 3  ? sulfaSALAzine (AZULFIDINE) 500 MG tablet Take 2 tablets (1,000 mg total) by mouth 2 (two) times daily. (Patient taking differently: Take 1,000 mg by mouth 2 (two) times daily after a meal.) 120 tablet 0  ? umeclidinium-vilanterol (ANORO ELLIPTA) 62.5-25 MCG/ACT AEPB Inhale 1 puff into the lungs daily.    ? ? ? ?Discharge Medications: ?Please see discharge summary for a list of discharge medications. ? ?Relevant Imaging Results: ? ?Relevant Lab Results: ? ? ?Additional Information ?SS# [id number] ? ?Emmary Culbreath, LCSW ? ? ? ? ?

## 2021-12-10 NOTE — ED Triage Notes (Signed)
Pt BIB [facility] from home d/t hypotension in the 60s. Per EMS, pt all sudden became diaphoretic and nonverbal w the family. Pt has dementia at baseline. EMS given 500 ml NS which has helped pt's BP up to 120s.  ? ?BP 129/78 ?CBG112 ?SPO2 98 ?HR54 ?

## 2021-12-10 NOTE — ED Provider Notes (Signed)
[facility] EMERGENCY DEPARTMENT Provider Note   CSN: [id number] Arrival date & time: 12/10/21  1409     History  Chief Complaint  Patient presents with   Hypotension    Jocelyn Shelton is a 66 y.o. female.  Pt is a 66 yo female with pmh of hypertension, hyperlipdemia, CAD, RA, and MI presenting for witnessed episode of unresponsiveness. Patient nonverbal at this time. I spoke with patient's daughter April who lives with her who states speech therapist came to the house today and found patient to have BP 112/ this morning prior to therapy. States patient was speaking when she became diaphoretic, slumped over, and became unresponsive. Bp at this time was 60/40. Came directly to ED.  Denies fevers, chills, vomiting, or diarrhea.   Came from nursing home on March 31st due to insurance issues. Living at home with daughter April. Baseline: Speaks slow and with a delay before speaking in one word sentences. Walks with cane. Needs assistance down steps. Hs of swallowing difficulties so speech therapy comes to the house. Mechanical soft diet.   The history is provided by the patient. No language interpreter was used.      Home Medications Prior to Admission medications   Medication Sig Start Date End Date Taking? Authorizing Provider  acetaminophen (TYLENOL) 500 MG tablet Take 1,000 mg by mouth every 6 (six) hours as needed for moderate pain or headache.   Yes [provider]  albuterol (PROVENTIL HFA) 108 (90 Base) MCG/ACT inhaler INHALE TWO PUFFS BY MOUTH EVERY 4 HOURS AS NEEDED FOR WHEEZING FOR COUGH FOR SHORTNESS OF BREATH Patient taking differently: Inhale 2 puffs into the lungs every 4 (four) hours as needed for wheezing or shortness of breath. 05/26/17  Yes Harriet Butte, DO  aspirin EC 81 MG EC tablet Take 1 tablet (81 mg total) by mouth daily. 05/24/15  Yes Barrett, Evelene Croon, PA-C  Calcium Carb-Cholecalciferol (CALCIUM 600+D3 PO) Take 1 tablet by mouth  daily.   Yes [provider]  carvedilol (COREG) 25 MG tablet Take 1 tablet (25 mg total) by mouth 2 (two) times daily with a meal. 11/26/21  Yes Fay Records, MD  cephALEXin (KEFLEX) 500 MG capsule Take 1 capsule (500 mg total) by mouth 3 (three) times daily. 12/10/21  Yes Lajean Saver, MD  diltiazem (CARDIZEM CD) 180 MG 24 hr capsule Take 1 capsule (180 mg total) by mouth daily. Patient taking differently: Take 180 mg by mouth every evening. 11/26/21  Yes Fay Records, MD  fluticasone [facility]) 50 MCG/ACT nasal spray Place 1 spray into both nostrils daily. 1 spray in each nostril every day Patient taking differently: Place 1 spray into both nostrils daily. 11/13/21  Yes Precious Gilding, DO  folic acid (FOLVITE) 1 MG tablet TAKE 1 TABLET (1 MG TOTAL) BY MOUTH DAILY. Patient taking differently: Take 1 mg by mouth daily. 01/22/15  Yes Karamalegos, Devonne Doughty, DO  memantine (NAMENDA) 10 MG tablet Take 1 tablet (10 mg total) by mouth 2 (two) times daily. 11/27/21  Yes Eppie Gibson, MD  Multiple Vitamin (MULTIVITAMIN WITH MINERALS) TABS tablet Take 1 tablet by mouth daily.   Yes [provider]  nitroGLYCERIN (NITROSTAT) 0.4 MG SL tablet Place 1 tablet (0.4 mg total) under the tongue every 5 (five) minutes as needed for chest pain. Please schedule appointment for future refills. 08/18/21  Yes Fay Records, MD  omeprazole (PRILOSEC) 40 MG capsule Take 40 mg by mouth every evening. 05/13/21  Yes [provider]  predniSONE (DELTASONE) 1 MG tablet Take 1 mg by mouth daily with breakfast. 06/27/18  Yes [provider]  rosuvastatin (CRESTOR) 20 MG tablet Take 1 tablet (20 mg total) by mouth daily. Patient taking differently: Take 20 mg by mouth every evening. 11/24/21  Yes Fay Records, MD  sulfaSALAzine (AZULFIDINE) 500 MG tablet Take 2 tablets (1,000 mg total) by mouth 2 (two) times daily. Patient taking differently: Take 1,000 mg by mouth 2 (two) times daily after a meal.  05/26/17  Yes Harriet Butte, DO  umeclidinium-vilanterol (ANORO ELLIPTA) 62.5-25 MCG/ACT AEPB Inhale 1 puff into the lungs daily. 07/12/20  Yes [provider]  Doxepin HCl 6 MG TABS TAKE 1 TABLET (6 MG TOTAL) BY MOUTH AT BEDTIME. 12/11/21   Holley Bouche, MD      Allergies    Ticagrelor, Cymbalta [duloxetine hcl], Sulfasalazine, Lisinopril, Olanzapine, and Tomato    Review of Systems   Review of Systems  Unable to perform ROS: Patient nonverbal  Constitutional:  Negative for chills and fever.  HENT:  Negative for ear pain and sore throat.   Eyes:  Negative for pain and visual disturbance.  Respiratory:  Negative for cough and shortness of breath.   Cardiovascular:  Negative for chest pain and palpitations.  Gastrointestinal:  Negative for abdominal pain and vomiting.  Genitourinary:  Negative for dysuria and hematuria.  Musculoskeletal:  Negative for arthralgias and back pain.  Skin:  Negative for color change and rash.  Neurological:  Positive for syncope. Negative for seizures.  All other systems reviewed and are negative.  Physical Exam Updated Vital Signs BP (!) 178/79 (BP Location: Left Arm)   Pulse 74   Temp 98.3 F (36.8 C) (Oral)   Resp 18   SpO2 97%  Physical Exam Vitals and nursing note reviewed.  Constitutional:      General: She is not in acute distress.    Appearance: She is well-developed.  HENT:     Head: Normocephalic and atraumatic.  Eyes:     Conjunctiva/sclera: Conjunctivae normal.  Cardiovascular:     Rate and Rhythm: Normal rate and regular rhythm.     Heart sounds: No murmur heard. Pulmonary:     Effort: Pulmonary effort is normal. No respiratory distress.     Breath sounds: Normal breath sounds.  Abdominal:     Palpations: Abdomen is soft.     Tenderness: There is no abdominal tenderness.  Musculoskeletal:        General: No swelling.     Cervical back: Neck supple.  Skin:    General: Skin is warm and dry.     Capillary Refill:  Capillary refill takes less than 2 seconds.  Neurological:     Mental Status: She is alert. She is disoriented.     GCS: GCS eye subscore is 4. GCS verbal subscore is 4. GCS motor subscore is 6.     Comments: Following commands Moves all four extremities spontaneously Slow to speak, 1-2 word sentences. Daughter states is baseline.  Psychiatric:        Mood and Affect: Mood normal.    ED Results / Procedures / Treatments   Labs (all labs ordered are listed, but only abnormal results are displayed) Labs Reviewed  CBC WITH DIFFERENTIAL/PLATELET - Abnormal; Notable for the following components:      Result Value   Hemoglobin 11.5 (*)    Lymphs Abs 0.6 (*)    All other components within normal limits  BASIC METABOLIC PANEL - Abnormal; Notable for the following components:   Glucose, Bld 101 (*)    Creatinine, Ser 1.08 (*)    Calcium 8.7 (*)    GFR, Estimated 57 (*)    All other components within normal limits  URINALYSIS, ROUTINE W REFLEX MICROSCOPIC - Abnormal; Notable for the following components:   Color, Urine STRAW (*)    Specific Gravity, Urine 1.004 (*)    Leukocytes,Ua TRACE (*)    Bacteria, UA RARE (*)    All other components within normal limits  MAGNESIUM  TSH  QUANTIFERON-TB GOLD PLUS  TROPONIN I (HIGH SENSITIVITY)  TROPONIN I (HIGH SENSITIVITY)    EKG EKG Interpretation  Date/Time:  Wednesday Dec 10 2021 14:18:27 EDT Ventricular Rate:  54 PR Interval:  128 QRS Duration: 75 QT Interval:  466 QTC Calculation: 442 R Axis:   59 Text Interpretation: Sinus rhythm Confirmed by Campbell Stall ([id number]) on [id number] 3:00:58 PM  Radiology No results found.  Procedures Procedures    Medications Ordered in ED Medications  cephALEXin (KEFLEX) capsule 500 mg (500 mg Oral Given 12/10/21 1859)    ED Course/ Medical Decision Making/ A&P                           Medical Decision Making Amount and/or Complexity of Data Reviewed Labs: ordered. Radiology:  ordered.  Risk Prescription drug management.   66 yo female with pmh of hypertension, hyperlipdemia, CAD, RA, and MI presenting for witnessed syncope.  ON exam patient is alert, afebrile, with stable vitals. On neuro exam patient is able to follow commands, moves all four extremities spontaneously, is slow to speak, 1-2 word sentences, and pleasantly demented. Daughter states this is patient's baseline since discharge from nursing home.  Stable ECG with no ST segment elevation or depression.  Normal sinus rhythm with a rate of 54 bpm.  Stable. Stable intervals. Stable troponin and electrolytes. Low concern MI, aortic dissection, pneumothorax, pneumonia. -No hypoxia -Stable hemoglobin. No active bleeding -No signs of respiratory distress, tachycardia, hypoxia, or difficulty breathing. PE considered but thought to be less likely.  -No signs/symptoms of sepsis. No UTI.    Laboratory studies pending.  Signed out to oncoming provider while awaiting lab work. If all stable, will be able to be discharge home. I personally spoke with patient's daughter reguarding today's findings and concerns for syncope. Pt expressed difficulty taking care of  her mother since DC from nursing home. Expresses difficulty finding another nursing home and states she just doesn't know where to start. I consulted our ED social care worker who was able to contact daughter and help her mother get into a memory care center.   Patient in no distress and overall condition improved here in the ED. Detailed discussions were had with the patient regarding current findings, and need for close f/u with PCP or on call doctor. The patient has been instructed to return immediately if the symptoms worsen in any way for re-evaluation. Patient verbalized understanding and is in agreement with current care plan. All questions answered prior to discharge.         Final Clinical Impression(s) / ED Diagnoses Final diagnoses:  Episode of  unresponsiveness  Syncope, unspecified syncope type  Essential hypertension  Weakness    Rx / DC Orders ED Discharge Orders          Ordered    cephALEXin (KEFLEX) 500 MG capsule  3 times daily  12/10/21 [place], Campbell P, DO [id number]

## 2021-12-10 NOTE — ED Notes (Signed)
In to assess patient, patient follows verbal commands but does not engage in any verbal conversation. Patient is resting. Call bell in reach. TV turned on for patient and warm blanket provided. TB quant test has been sent to lab.  ?

## 2021-12-11 ENCOUNTER — Other Ambulatory Visit: Payer: Self-pay | Admitting: Student

## 2021-12-11 DIAGNOSIS — G4700 Insomnia, unspecified: Secondary | ICD-10-CM

## 2021-12-15 LAB — QUANTIFERON-TB GOLD PLUS (RQFGPL)
QuantiFERON Mitogen Value: 3.23 IU/mL
QuantiFERON Nil Value: 0.01 IU/mL
QuantiFERON TB1 Ag Value: 0.02 IU/mL
QuantiFERON TB2 Ag Value: 0.01 IU/mL

## 2021-12-15 LAB — QUANTIFERON-TB GOLD PLUS: QuantiFERON-TB Gold Plus: NEGATIVE

## 2021-12-19 ENCOUNTER — Other Ambulatory Visit: Payer: Self-pay | Admitting: Student

## 2021-12-19 DIAGNOSIS — F03B0 Unspecified dementia, moderate, without behavioral disturbance, psychotic disturbance, mood disturbance, and anxiety: Secondary | ICD-10-CM

## 2021-12-30 ENCOUNTER — Encounter: Payer: Self-pay | Admitting: *Deleted

## 2022-01-01 ENCOUNTER — Other Ambulatory Visit: Payer: Medicare Other | Admitting: Hospice

## 2022-01-01 DIAGNOSIS — F01B0 Vascular dementia, moderate, without behavioral disturbance, psychotic disturbance, mood disturbance, and anxiety: Secondary | ICD-10-CM

## 2022-01-01 DIAGNOSIS — J449 Chronic obstructive pulmonary disease, unspecified: Secondary | ICD-10-CM

## 2022-01-01 DIAGNOSIS — R1310 Dysphagia, unspecified: Secondary | ICD-10-CM

## 2022-01-01 DIAGNOSIS — R55 Syncope and collapse: Secondary | ICD-10-CM

## 2022-01-01 DIAGNOSIS — Z515 Encounter for palliative care: Secondary | ICD-10-CM

## 2022-01-01 NOTE — Progress Notes (Signed)
Designer, [facility]ellery Palliative Care Consult Note Telephone: [phone]  Fax: [phone]  PATIENT NAME: Jocelyn Shelton [address] [place] [address] [phone] (home)  DOB: [birth year] MRN: [id number]  PRIMARY CARE PROVIDER:    Holley Bouche, MD,  [place] [address] [phone]  REFERRING PROVIDER:   Holley Bouche, MD [place],  [place] [address] [phone]  RESPONSIBLE PARTY:   Self/April Contact Information     Name Relation Home Work [place] Daughter [phone]         TELEHEALTH VISIT STATEMENT Due to the COVID-19 crisis, this visit was done via telemedicine from my office and it was initiated and consent by this patient and or family.  I connected with patient OR PROXY by a telephone/video  and verified that I am speaking with the correct person. I discussed the limitations of evaluation and management by telemedicine. The patient expressed understanding and agreed to proceed. Palliative Care was asked to follow this patient to address advance care planning, complex medical decision making and goals of care clarification. April is with patient during visit.  This is a follow-up visit.    ASSESSMENT AND / RECOMMENDATIONS:   CODE STATUS: Patient and daughter April affirm patient is a Do Not Resuscitate; does not have signed DNR at home. In person visit scheduled for 01/22/22 to sign.   Goals of Care: Goals include to maximize quality of life and symptom management  Symptom Management/Plan: Syncope: Seen in ED 12/10/2021, not admitted, syncope related to hypotension.  Was treated and discharged home.  Daughter reports patient getting adequate hydration and BP readings at home within her normal limits, no longer hypotensive.  Continue to offer assistance during meals to ensure adequate oral intake.  Follow-up with PCP.  Routine CBC BMP. COPD: Continue Albuterol and Anoro Ellipta.  Continue Prednisone as ordered. Followed by Pulmonologist. F/u appointment 11/17/21. Dysphagia: Continue mechanical soft diet thin liquids.  ST consult is ongoing.  Discussed aspiration precautions.  Dementia: worsening ongoing memory loss/confusion in line with Dementia disease trajectory. FAST 6C incontinent of bladder, assistance with ADLs. Continue Namenda as ordered. Continue with word search/puzzles, coloring book. Optimize ongoing supportive care.   HTN: Managed by Diltiazem and Coreg. BP checks at within normal range.   Follow up: Palliative care will continue to follow for complex medical decision making, advance care planning, and clarification of goals. Return 6 weeks or prn. Encouraged to call provider sooner with any concerns.   Family /Caregiver/Community Supports:  Patient lives at home with her daughter April and her son.  Likely plan for memory care placement. HOSPICE ELIGIBILITY/DIAGNOSIS: TBD  Chief Complaint: Follow-up  visit  HISTORY OF PRESENT ILLNESS:  Jocelyn Shelton is a 66 y.o. year old female  with multiple morbidities requiring close monitoring and with high risk of complications and  mortality: Dementia, COPD, Hypertension, hyperlipidemia, CAD, GERD, MDD.  Recent ED visit  12/10/21 for syncope related to low BP; she was hydrated, stabilized and discharged home same day.  She denies pain/discomfort, endorses weakness.  History obtained from review of EMR, discussion with primary team, caregiver, family and/or Ms. Hsiung.  Review and summarization of Epic records shows history from other than patient. Rest of 10 point ROS asked and negative.  Independent interpretation of tests and reviewed as needed, available labs, patient records, imaging, studies and related documents from the EMR.   PAST MEDICAL HISTORY:  Active Ambulatory Problems  Diagnosis Date Noted   Major depressive disorder, recurrent episode ([place]) 09/23/2006   Tobacco use disorder 09/23/2006    Hypertension 09/23/2006   GERD (gastroesophageal reflux disease) 08/02/2008   Hypokalemia 02/28/2013   Rheumatoid arthritis ([place]) 03/14/2013   COPD (chronic obstructive pulmonary disease) ([place]) 08/30/2015   Scleroderma ([place]) 03/08/2016   Immunosuppressed status ([place])    Coronary artery disease involving native coronary artery of native heart without angina pectoris    Pulmonary hypertension ([place]), Possible 06/17/2017   Centrilobular emphysema ([place]) 07/29/2017   ILD (interstitial lung disease) ([place]) 07/29/2017   Abnormality of heart beat 07/07/2019   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   Memory difficulty 07/30/2021   Alteration of awareness 07/31/2021   AMS (altered mental status) 08/02/2021   Moderate dementia (HCC)    CKD (chronic kidney disease), stage II    Chronic cough 11/13/2021   Insomnia 11/13/2021   Resolved Ambulatory Problems    Diagnosis Date Noted   PSYCHOSIS, UNSPECIFIED 09/23/2006   Raynauds syndrome 09/23/2006   CLAUDICATION, INTERMITTENT 09/23/2006   Callus of foot 03/18/2007   OSTEOARTHRITIS, MULTI SITES 09/23/2006   Contact dermatitis 03/10/2011   Erythema nodosum 04/27/2011   Lung mass 04/27/2011   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   Cough 11/24/2011   Facial rash 02/02/2012   Sinusitis 09/28/2012   Hypertension, poor control 09/29/2012   Claudication ([place]) 11/07/2012   Diarrhea 02/28/2013   Abdominal cramping 02/28/2013   Dehydration 02/28/2013   Anemia 03/01/2013   Other pancytopenia ([place]) 03/01/2013   Thrombocytopenia, unspecified ([place]) 03/01/2013   Anemia 03/02/2013   Preventative health care 03/26/2014   Chronic foot pain 03/26/2014   Plantar wart of right foot 03/26/2014   Allergic rhinitis 12/03/2014   Tendinitis of right rotator cuff 12/03/2014   Chronic pain syndrome 12/03/2014   Pleural effusion 02/08/2015   Tachycardia    Chest pain    Hypoxia    Abdominal pain, acute    Volume overload    NSTEMI (non-ST  elevated myocardial infarction) [facility])    Hypertensive emergency    Coronary artery disease involving native heart without angina pectoris 05/23/2015   S/P angioplasty with stent, 05/22/15 pRCA DES and pLCX DES 05/23/2015   CAP (community acquired pneumonia) 05/23/2015   Adverse drug reaction 05/23/2015   Upper airway cough syndrome 08/30/2015   Syncope 03/08/2016   Hyponatremia 03/08/2016   Herpes zoster 03/08/2016   Shingles    Fever    Foot ulcer, right (HCC)    FUO (fever of unknown origin)    Claudication in peripheral vascular disease ([place]) 10/28/2016   Hyponatremia 03/16/2017   Chest pain 03/17/2017   Elevated troponin 03/17/2017   Generalized weakness    Chest pain 06/16/2017   Near syncope 06/17/2017   Hypoalbuminemia due to protein-calorie malnutrition ([place]) 06/17/2017   Leg pain, diffuse 06/17/2017   Dilation of esophagus 06/17/2017   Bronchiectasis ([place]) 06/17/2017   Avascular necrosis of humeral head, right ([place]) 06/17/2017   Syncope 06/17/2017   Cigarette smoker 02/04/2018   Polypharmacy 09/08/2018   Past Medical History:  Diagnosis Date   Avascular necrosis of humeral head, left ([place]) 06/17/2017   CAD in native artery 05/23/2015   High cholesterol    Leg pain    Myocardial infarction ([place]) 04/2015   Reflux     SOCIAL HX:  Social History   Tobacco Use   Smoking status: Every Day    Packs/day: 0.50    Years: 46.00    Total pack years:  23.00    Types: Cigarettes   Smokeless tobacco: Never  Substance Use Topics   Alcohol use: Yes    Alcohol/week: 4.0 standard drinks of alcohol    Types: 4 Standard drinks or equivalent per week     FAMILY HX:  Family History  Problem Relation Age of Onset   Hypertension Father    Heart attack Mother    Diabetes Daughter    Diabetes Sister    Lung cancer Brother 44      ALLERGIES:  Allergies  Allergen Reactions   Ticagrelor Shortness Of Breath   Cymbalta [Duloxetine Hcl] Diarrhea and Nausea Only    No  appetite, stomach pain   Sulfasalazine Other (See Comments) and Cough    After taking this for a period of time, this medication makes the patient COUGH   Lisinopril Cough   Olanzapine Other (See Comments)    Interfered with Blood pressure medications   Tomato Rash      PERTINENT MEDICATIONS:  Outpatient Encounter Medications as of 01/01/2022  Medication Sig   acetaminophen (TYLENOL) 500 MG tablet Take 1,000 mg by mouth every 6 (six) hours as needed for moderate pain or headache.   albuterol (PROVENTIL HFA) 108 (90 Base) MCG/ACT inhaler INHALE TWO PUFFS BY MOUTH EVERY 4 HOURS AS NEEDED FOR WHEEZING FOR COUGH FOR SHORTNESS OF BREATH (Patient taking differently: Inhale 2 puffs into the lungs every 4 (four) hours as needed for wheezing or shortness of breath.)   aspirin EC 81 MG EC tablet Take 1 tablet (81 mg total) by mouth daily.   Calcium Carb-Cholecalciferol (CALCIUM 600+D3 PO) Take 1 tablet by mouth daily.   carvedilol (COREG) 25 MG tablet Take 1 tablet (25 mg total) by mouth 2 (two) times daily with a meal.   cephALEXin (KEFLEX) 500 MG capsule Take 1 capsule (500 mg total) by mouth 3 (three) times daily.   diltiazem (CARDIZEM CD) 180 MG 24 hr capsule Take 1 capsule (180 mg total) by mouth daily. (Patient taking differently: Take 180 mg by mouth every evening.)   Doxepin HCl 6 MG TABS TAKE 1 TABLET (6 MG TOTAL) BY MOUTH AT BEDTIME.   fluticasone (FLONASE) 50 MCG/ACT nasal spray Place 1 spray into both nostrils daily. 1 spray in each nostril every day (Patient taking differently: Place 1 spray into both nostrils daily.)   folic acid (FOLVITE) 1 MG tablet TAKE 1 TABLET (1 MG TOTAL) BY MOUTH DAILY. (Patient taking differently: Take 1 mg by mouth daily.)   memantine (NAMENDA) 10 MG tablet TAKE 1 TABLET BY MOUTH TWICE A DAY   Multiple Vitamin (MULTIVITAMIN WITH MINERALS) TABS tablet Take 1 tablet by mouth daily.   nitroGLYCERIN (NITROSTAT) 0.4 MG SL tablet Place 1 tablet (0.4 mg total) under the  tongue every 5 (five) minutes as needed for chest pain. Please schedule appointment for future refills.   omeprazole (PRILOSEC) 40 MG capsule Take 40 mg by mouth every evening.   predniSONE (DELTASONE) 1 MG tablet Take 1 mg by mouth daily with breakfast.   rosuvastatin (CRESTOR) 20 MG tablet Take 1 tablet (20 mg total) by mouth daily. (Patient taking differently: Take 20 mg by mouth every evening.)   sulfaSALAzine (AZULFIDINE) 500 MG tablet Take 2 tablets (1,000 mg total) by mouth 2 (two) times daily. (Patient taking differently: Take 1,000 mg by mouth 2 (two) times daily after a meal.)   umeclidinium-vilanterol (ANORO ELLIPTA) 62.5-25 MCG/ACT AEPB Inhale 1 puff into the lungs daily.   No facility-administered encounter medications  on file as of 01/01/2022.   I spent 60 minutes providing this consultation; this includes time spent with patient/family, chart review and documentation. More than 50% of the time in this consultation was spent on counseling and coordinating communication   Thank you for the opportunity to participate in the care of Ms. Skare.  The palliative care team will continue to follow. Please call our office at [phone] if we can be of additional assistance.   Note: Portions of this note were generated with Lobbyist. Dictation errors may occur despite best attempts at proofreading.  Teodoro Spray, NP

## 2022-01-22 ENCOUNTER — Other Ambulatory Visit: Payer: Medicare Other | Admitting: Hospice

## 2022-01-22 DIAGNOSIS — Z515 Encounter for palliative care: Secondary | ICD-10-CM

## 2022-01-22 DIAGNOSIS — F01B0 Vascular dementia, moderate, without behavioral disturbance, psychotic disturbance, mood disturbance, and anxiety: Secondary | ICD-10-CM

## 2022-01-22 DIAGNOSIS — J449 Chronic obstructive pulmonary disease, unspecified: Secondary | ICD-10-CM

## 2022-01-22 DIAGNOSIS — I10 Essential (primary) hypertension: Secondary | ICD-10-CM

## 2022-01-22 DIAGNOSIS — R1310 Dysphagia, unspecified: Secondary | ICD-10-CM

## 2022-01-22 DIAGNOSIS — R55 Syncope and collapse: Secondary | ICD-10-CM

## 2022-01-22 NOTE — Progress Notes (Signed)
Therapist, nutritional Palliative Care Consult Note Telephone: [phone]  Fax: [phone]  PATIENT NAME: Jocelyn Shelton [address] [place] [address] [phone] (home)  DOB: 1956-04-01 MRN: [id number]  PRIMARY CARE PROVIDER:    Bess Kinds, MD,  [address] [place] [address] [phone]  REFERRING PROVIDER:   Bess Kinds, MD [address] [place],  [place] [address] [phone]  RESPONSIBLE PARTY:   Self/April Contact Information     Name Relation Home Work [place] Daughter [phone]        Palliative Care was asked to follow this patient to address advance care planning, complex medical decision making and goals of care clarification. April is with patient during visit. This is a follow-up visit.    ASSESSMENT AND / RECOMMENDATIONS:   CODE STATUS: Patient and daughter April affirm patient is a Do Not Resuscitate.  NP signed DNR form for patient to keep at home; same document uploaded to epic today. Goals of Care: Goals include to maximize quality of life and symptom management  Symptom Management/Plan: Syncope: No syncope since syncopal experience  12/10/2021, for which she was seen in the ED but not admitted, syncope related to hypotension. Continue adequate hydration. BP readings at home within her normal limits, no longer hypotensive.  BP this morning 130/70. Continue to offer assistance during meals to ensure adequate oral intake.  Follow-up with PCP.  Routine CBC BMP. COPD: No exacerbation. Continue Albuterol and Anoro Ellipta. Avoid triggers. Slow deep breathing. Followed by Pulmonologist. Dysphagia: Continue mechanical soft diet thin liquids. Completed with ST.  Discussed aspiration precautions. Dementia: worsening ongoing memory loss/confusion in line with Dementia disease trajectory. Impoverished thought. Further decline from FAST : to FAST 6D incontinent of bladder and bowel;  assistance with  ADLs. Continue Namenda as ordered. Continue with word search/puzzles, coloring book. Optimize ongoing supportive care.   HTN: Managed by Diltiazem and Coreg. BP checks at within normal range.   Follow up: Palliative care will continue to follow for complex medical decision making, advance care planning, and clarification of goals. Return 6 weeks or prn. Encouraged to call provider sooner with any concerns.   Family /Caregiver/Community Supports:  Patient lives at home with her daughter April and her son.  Likely plan for memory care placement. Discussed community resources such as [employer] and [employer]. April would explore, and also work at getting a ground floor apartment instead of current upstairs patient has to climb.  HOSPICE ELIGIBILITY/DIAGNOSIS: TBD  Chief Complaint: Follow-up  visit  HISTORY OF PRESENT ILLNESS:  Jocelyn Shelton is a 66 y.o. year old female  with multiple morbidities requiring close monitoring and with high risk of complications and  mortality: Dementia, COPD, Hypertension, hyperlipidemia, CAD, GERD, MDD. Hx of Scleroderma, per April. Marland Kitchen  History obtained from review of EMR, discussion with primary team, caregiver, family and/or Ms. Sulton.  Review and summarization of Epic records shows history from other than patient. Rest of 10 point ROS asked and negative.  ! reviewed as needed, available labs, patient records, imaging, studies and related documents from the EMR.   PAST MEDICAL HISTORY:  Active Ambulatory Problems    Diagnosis Date Noted   Major depressive disorder, recurrent episode (HCC) 09/23/2006   Tobacco use disorder 09/23/2006   Hypertension 09/23/2006   GERD (gastroesophageal reflux disease) 08/02/2008   Hypokalemia 02/28/2013   Rheumatoid arthritis (HCC) 03/14/2013   COPD (chronic obstructive pulmonary disease) (HCC) 08/30/2015   Scleroderma (  [place]) 03/08/2016   Immunosuppressed status ([place])    Coronary artery disease involving native coronary  artery of native heart without angina pectoris    Pulmonary hypertension ([place]), Possible 06/17/2017   Centrilobular emphysema ([place]) 07/29/2017   ILD (interstitial lung disease) ([place]) 07/29/2017   Abnormality of heart beat 07/07/2019   Blood coagulation defect ([employer]) 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   Memory difficulty 07/30/2021   Alteration of awareness 07/31/2021   AMS (altered mental status) 08/02/2021   Moderate dementia (HCC)    CKD (chronic kidney disease), stage II    Chronic cough 11/13/2021   Insomnia 11/13/2021   Resolved Ambulatory Problems    Diagnosis Date Noted   PSYCHOSIS, UNSPECIFIED 09/23/2006   Raynauds syndrome 09/23/2006   CLAUDICATION, INTERMITTENT 09/23/2006   Callus of foot 03/18/2007   OSTEOARTHRITIS, MULTI SITES 09/23/2006   Contact dermatitis 03/10/2011   Erythema nodosum 04/27/2011   Lung mass 04/27/2011   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   Cough 11/24/2011   Facial rash 02/02/2012   Sinusitis 09/28/2012   Hypertension, poor control 09/29/2012   Claudication ([place]) 11/07/2012   Diarrhea 02/28/2013   Abdominal cramping 02/28/2013   Dehydration 02/28/2013   Anemia 03/01/2013   Other pancytopenia ([place]) 03/01/2013   Thrombocytopenia, unspecified ([place]) 03/01/2013   Anemia 03/02/2013   Preventative health care 03/26/2014   Chronic foot pain 03/26/2014   Plantar wart of right foot 03/26/2014   Allergic rhinitis 12/03/2014   Tendinitis of right rotator cuff 12/03/2014   Chronic pain syndrome 12/03/2014   Pleural effusion 02/08/2015   Tachycardia    Chest pain    Hypoxia    Abdominal pain, acute    Volume overload    NSTEMI (non-ST elevated myocardial infarction) [facility])    Hypertensive emergency    Coronary artery disease involving native heart without angina pectoris 05/23/2015   S/P angioplasty with stent, 05/22/15 pRCA DES and pLCX DES 05/23/2015   CAP (community acquired pneumonia) 05/23/2015   Adverse drug reaction 05/23/2015   Upper airway  cough syndrome 08/30/2015   Syncope 03/08/2016   Hyponatremia 03/08/2016   Herpes zoster 03/08/2016   Shingles    Fever    Foot ulcer, right (HCC)    FUO (fever of unknown origin)    Claudication in peripheral vascular disease ([place]) 10/28/2016   Hyponatremia 03/16/2017   Chest pain 03/17/2017   Elevated troponin 03/17/2017   Generalized weakness    Chest pain 06/16/2017   Near syncope 06/17/2017   Hypoalbuminemia due to protein-calorie malnutrition ([place]) 06/17/2017   Leg pain, diffuse 06/17/2017   Dilation of esophagus 06/17/2017   Bronchiectasis ([place]) 06/17/2017   Avascular necrosis of humeral head, right ([place]) 06/17/2017   Syncope 06/17/2017   Cigarette smoker 02/04/2018   Polypharmacy 09/08/2018   Past Medical History:  Diagnosis Date   Avascular necrosis of humeral head, left ([place]) 06/17/2017   CAD in native artery 05/23/2015   High cholesterol    Leg pain    Myocardial infarction ([place]) 04/2015   Reflux     SOCIAL HX:  Social History   Tobacco Use   Smoking status: Every Day    Packs/day: 0.50    Years: 46.00    Total pack years: 23.00    Types: Cigarettes   Smokeless tobacco: Never  Substance Use Topics   Alcohol use: Yes    Alcohol/week: 4.0 standard drinks of alcohol    Types: 4 Standard drinks or equivalent per week     FAMILY HX:  Family History  Problem  Relation Age of Onset   Hypertension Father    Heart attack Mother    Diabetes Daughter    Diabetes Sister    Lung cancer Brother 49      ALLERGIES:  Allergies  Allergen Reactions   Ticagrelor Shortness Of Breath   Cymbalta [Duloxetine Hcl] Diarrhea and Nausea Only    No appetite, stomach pain   Sulfasalazine Other (See Comments) and Cough    After taking this for a period of time, this medication makes the patient COUGH   Lisinopril Cough   Olanzapine Other (See Comments)    Interfered with Blood pressure medications   Tomato Rash      PERTINENT MEDICATIONS:  Outpatient Encounter  Medications as of 01/22/2022  Medication Sig   acetaminophen (TYLENOL) 500 MG tablet Take 1,000 mg by mouth every 6 (six) hours as needed for moderate pain or headache.   albuterol (PROVENTIL HFA) 108 (90 Base) MCG/ACT inhaler INHALE TWO PUFFS BY MOUTH EVERY 4 HOURS AS NEEDED FOR WHEEZING FOR COUGH FOR SHORTNESS OF BREATH (Patient taking differently: Inhale 2 puffs into the lungs every 4 (four) hours as needed for wheezing or shortness of breath.)   aspirin EC 81 MG EC tablet Take 1 tablet (81 mg total) by mouth daily.   Calcium Carb-Cholecalciferol (CALCIUM 600+D3 PO) Take 1 tablet by mouth daily.   carvedilol (COREG) 25 MG tablet Take 1 tablet (25 mg total) by mouth 2 (two) times daily with a meal.   cephALEXin (KEFLEX) 500 MG capsule Take 1 capsule (500 mg total) by mouth 3 (three) times daily.   diltiazem (CARDIZEM CD) 180 MG 24 hr capsule Take 1 capsule (180 mg total) by mouth daily. (Patient taking differently: Take 180 mg by mouth every evening.)   Doxepin HCl 6 MG TABS TAKE 1 TABLET (6 MG TOTAL) BY MOUTH AT BEDTIME.   fluticasone (FLONASE) 50 MCG/ACT nasal spray Place 1 spray into both nostrils daily. 1 spray in each nostril every day (Patient taking differently: Place 1 spray into both nostrils daily.)   folic acid (FOLVITE) 1 MG tablet TAKE 1 TABLET (1 MG TOTAL) BY MOUTH DAILY. (Patient taking differently: Take 1 mg by mouth daily.)   memantine (NAMENDA) 10 MG tablet TAKE 1 TABLET BY MOUTH TWICE A DAY   Multiple Vitamin (MULTIVITAMIN WITH MINERALS) TABS tablet Take 1 tablet by mouth daily.   nitroGLYCERIN (NITROSTAT) 0.4 MG SL tablet Place 1 tablet (0.4 mg total) under the tongue every 5 (five) minutes as needed for chest pain. Please schedule appointment for future refills.   omeprazole (PRILOSEC) 40 MG capsule Take 40 mg by mouth every evening.   predniSONE (DELTASONE) 1 MG tablet Take 1 mg by mouth daily with breakfast.   rosuvastatin (CRESTOR) 20 MG tablet Take 1 tablet (20 mg total) by  mouth daily. (Patient taking differently: Take 20 mg by mouth every evening.)   sulfaSALAzine (AZULFIDINE) 500 MG tablet Take 2 tablets (1,000 mg total) by mouth 2 (two) times daily. (Patient taking differently: Take 1,000 mg by mouth 2 (two) times daily after a meal.)   umeclidinium-vilanterol (ANORO ELLIPTA) 62.5-25 MCG/ACT AEPB Inhale 1 puff into the lungs daily.   No facility-administered encounter medications on file as of 01/22/2022.   I spent 60 minutes providing this consultation; this includes time spent with patient/family, chart review and documentation. More than 50% of the time in this consultation was spent on counseling and coordinating communication   Thank you for the opportunity to participate in the  care of Ms. Lutes.  The palliative care team will continue to follow. Please call our office at [phone] if we can be of additional assistance.   Note: Portions of this note were generated with Scientist, clinical (histocompatibility and immunogenetics). Dictation errors may occur despite best attempts at proofreading.  Rosaura Carpenter, NP

## 2022-01-26 ENCOUNTER — Telehealth: Payer: Self-pay

## 2022-01-26 NOTE — Telephone Encounter (Signed)
Daughter calls nurse line in regards to patient behavior.   Daughter reports for the last 1.5 weeks the patient has been taking off her depends and urinating on her self or on the floor. Daughter reports this is out of character for her.   Patient is nonverbal and daughter is unsure if patient is experiencing dysuria or frequency. Daughter denies any fevers or blood in urine.   Daughter denies any other symptom changes. Daughter reports other than taking her depends off she has been her normal self. No weakness, chest pain or SOB.   Patient scheduled for evaluation on 7/5.  Red flags discussed in the meantime.

## 2022-01-28 ENCOUNTER — Ambulatory Visit (INDEPENDENT_AMBULATORY_CARE_PROVIDER_SITE_OTHER): Payer: Medicare Other | Admitting: Family Medicine

## 2022-01-28 ENCOUNTER — Telehealth: Payer: Self-pay | Admitting: *Deleted

## 2022-01-28 VITALS — BP 110/64 | HR 73 | Wt 131.0 lb

## 2022-01-28 DIAGNOSIS — I10 Essential (primary) hypertension: Secondary | ICD-10-CM | POA: Diagnosis not present

## 2022-01-28 DIAGNOSIS — I2510 Atherosclerotic heart disease of native coronary artery without angina pectoris: Secondary | ICD-10-CM | POA: Diagnosis not present

## 2022-01-28 DIAGNOSIS — R058 Other specified cough: Secondary | ICD-10-CM | POA: Diagnosis not present

## 2022-01-28 DIAGNOSIS — F03B0 Unspecified dementia, moderate, without behavioral disturbance, psychotic disturbance, mood disturbance, and anxiety: Secondary | ICD-10-CM

## 2022-01-28 DIAGNOSIS — J449 Chronic obstructive pulmonary disease, unspecified: Secondary | ICD-10-CM

## 2022-01-28 MED ORDER — ALBUTEROL SULFATE HFA 108 (90 BASE) MCG/ACT IN AERS: INHALATION_SPRAY | RESPIRATORY_TRACT | 1 refills | Status: DC

## 2022-01-28 MED ORDER — ANORO ELLIPTA 62.5-25 MCG/ACT IN AEPB: 1.0000 | INHALATION_SPRAY | Freq: Every day | RESPIRATORY_TRACT | 0 refills | Status: DC

## 2022-01-28 MED ORDER — IPRATROPIUM BROMIDE 0.03 % NA SOLN
2.0000 | Freq: Two times a day (BID) | NASAL | 0 refills | Status: DC
Start: 2022-01-28 — End: 2022-02-26

## 2022-01-28 NOTE — Patient Instructions (Addendum)
It was nice seeing you today!  Stop the doxepin. Try melatonin instead for sleep.  Try Atrovent nasal spray instead of Flonase.  Keep checking her blood pressure at home from time to time.  Stay well, Littie Deeds, MD [facility] [phone]  --  Make sure to check out at the front desk before you leave today.  Please arrive at least 15 minutes prior to your scheduled appointments.  If you had blood work today, I will send you a MyChart message or a letter if results are normal. Otherwise, I will give you a call.  If you had a referral placed, they will call you to set up an appointment. Please give [place] a call if you don't hear back in the next 2 weeks.  If you need additional refills before your next appointment, please call your pharmacy first.

## 2022-01-28 NOTE — Progress Notes (Unsigned)
    SUBJECTIVE:   CHIEF COMPLAINT / HPI:  Chief Complaint  Patient presents with   Behavorial Changes    Phone note reviewed.  Here with daughter today.  Primary concern by daughter is that patient has had a change in her behavior.  For the last week, patient has been taking off her depends and urinating on herself or on the floor.  At baseline, patient is nonverbal and has urinary incontinence.  She normally uses a bedside commode but has not been using it the past week.  Daughter frequently has to clean the floor as a result.  Denies any aggressive behavior.  Daughter reports that patient has not been eating as much the past few weeks.  She tries to give her 1 nutritional shake per day.  It has been difficult to get patient to eat without encouragement.  Daughter is also concerned about persistent rhinorrhea and cough.  She believes the rhinorrhea interferes with her eating as nasal drainage gets onto her food.  Requesting refill for inhaler medications.  PERTINENT  PMH / PSH: HTN, CAD, COPD, tobacco use disorder, RA, GERD  Patient Care Team: Bess Kinds, MD as PCP - General (Family Medicine) Pricilla Riffle, MD as PCP - Cardiology (Cardiology)   OBJECTIVE:   BP 110/64   Pulse 73   Wt 131 lb (59.4 kg)   SpO2 99%   BMI 21.14 kg/m   Physical Exam      07/05/2019    9:30 AM  Depression screen PHQ 2/9  Decreased Interest 0  Down, Depressed, Hopeless 0  PHQ - 2 Score 0     Wt Readings from Last 3 Encounters:  01/28/22 131 lb (59.4 kg)  11/27/21 140 lb 12.8 oz (63.9 kg)  11/19/21 140 lb 6.4 oz (63.7 kg)      {Labs  Heme  Chem  Endocrine  Serology  Results Review (optional):23779}  ASSESSMENT/PLAN:   No problem-specific Assessment & Plan notes found for this encounter.   HCPOA  No follow-ups on file.   Littie Deeds, MD [facility]

## 2022-01-28 NOTE — Telephone Encounter (Signed)
   Telephone encounter was:  Successful.  01/28/2022 Name: JOCLYNN LUMB MRN: [id number] DOB: June 01, 1956  Tahni TARISA PAOLA is a 66 y.o. year old female who is a primary care patient of Sowell, Apolinar Junes, MD . The community resource team was consulted for assistance with Will mail advance directive package and provided information on POA paperwork and people to help get it done.  Care guide performed the following interventions: Patient provided with information about care guide support team and interviewed to confirm resource needs.  Follow Up Plan:  caregiver provided required information no further assistance needed  Norah Fick Greenauer -[facility] Guide , Embedded Care Coordination [facility], Care Management  [phone] [address] , [place] [address] Email : [email or web address]

## 2022-01-29 NOTE — Assessment & Plan Note (Signed)
Well-controlled on carvedilol and diltiazem with ambulatory blood pressure readings in the low end of normal.  Continue present management for now, advised to continue checking BP at home and call office if having persistent BP readings less than 100/60.  If persistently hypotensive, would consider discontinuing diltiazem.

## 2022-01-29 NOTE — Assessment & Plan Note (Addendum)
Recent change in behavior likely due to progression of underlying dementia, low clinical suspicion for UTI so will not obtain urine studies.  Weight loss also likely related to dementia.  Recommendations: - continue memantine - offered adding donepezil, daughter declined after shared decision-making - concern for side effects - increase nutritional supplement to 2-3 times daily as able - d/c doxepin, trial melatonin - d/c Flonase, trial Atrovent - referral to community care coordination to assist daughter with financial strain and obtaining HCPOA, daughter amenable to referral - make be a good candidate for [facility] geriatrics clinic

## 2022-02-03 ENCOUNTER — Other Ambulatory Visit: Payer: Medicare Other

## 2022-02-08 ENCOUNTER — Other Ambulatory Visit: Payer: Self-pay | Admitting: Internal Medicine

## 2022-02-08 DIAGNOSIS — E785 Hyperlipidemia, unspecified: Secondary | ICD-10-CM

## 2022-02-08 DIAGNOSIS — I2510 Atherosclerotic heart disease of native coronary artery without angina pectoris: Secondary | ICD-10-CM

## 2022-02-12 ENCOUNTER — Other Ambulatory Visit: Payer: Self-pay | Admitting: Student

## 2022-02-12 DIAGNOSIS — F03B0 Unspecified dementia, moderate, without behavioral disturbance, psychotic disturbance, mood disturbance, and anxiety: Secondary | ICD-10-CM

## 2022-02-15 ENCOUNTER — Emergency Department (HOSPITAL_COMMUNITY)
Admission: EM | Admit: 2022-02-15 | Discharge: 2022-02-15 | Disposition: A | Discharge status: Home / Self Care | Payer: Medicare Other | Attending: Emergency Medicine | Admitting: Emergency Medicine

## 2022-02-15 ENCOUNTER — Emergency Department (HOSPITAL_COMMUNITY): Payer: Medicare Other

## 2022-02-15 ENCOUNTER — Other Ambulatory Visit: Payer: Self-pay

## 2022-02-15 ENCOUNTER — Encounter (HOSPITAL_COMMUNITY): Payer: Self-pay

## 2022-02-15 DIAGNOSIS — I129 Hypertensive chronic kidney disease with stage 1 through stage 4 chronic kidney disease, or unspecified chronic kidney disease: Secondary | ICD-10-CM | POA: Diagnosis not present

## 2022-02-15 DIAGNOSIS — N189 Chronic kidney disease, unspecified: Secondary | ICD-10-CM | POA: Insufficient documentation

## 2022-02-15 DIAGNOSIS — R5381 Other malaise: Secondary | ICD-10-CM | POA: Diagnosis not present

## 2022-02-15 DIAGNOSIS — I2510 Atherosclerotic heart disease of native coronary artery without angina pectoris: Secondary | ICD-10-CM | POA: Diagnosis not present

## 2022-02-15 DIAGNOSIS — R531 Weakness: Secondary | ICD-10-CM | POA: Diagnosis present

## 2022-02-15 DIAGNOSIS — J449 Chronic obstructive pulmonary disease, unspecified: Secondary | ICD-10-CM | POA: Insufficient documentation

## 2022-02-15 DIAGNOSIS — Z7982 Long term (current) use of aspirin: Secondary | ICD-10-CM | POA: Diagnosis not present

## 2022-02-15 DIAGNOSIS — F03C0 Unspecified dementia, severe, without behavioral disturbance, psychotic disturbance, mood disturbance, and anxiety: Secondary | ICD-10-CM | POA: Diagnosis not present

## 2022-02-15 LAB — URINALYSIS, ROUTINE W REFLEX MICROSCOPIC
Bacteria, UA: NONE SEEN
Bilirubin Urine: NEGATIVE
Glucose, UA: NEGATIVE mg/dL
Hgb urine dipstick: NEGATIVE
Ketones, ur: NEGATIVE mg/dL
Leukocytes,Ua: NEGATIVE
Nitrite: NEGATIVE
Protein, ur: NEGATIVE mg/dL
Specific Gravity, Urine: 1.01 (ref 1.005–1.030)
pH: 7 (ref 5.0–8.0)

## 2022-02-15 LAB — COMPREHENSIVE METABOLIC PANEL
ALT: 27 U/L (ref 0–44)
AST: 37 U/L (ref 15–41)
Albumin: 3.3 g/dL — ABNORMAL LOW (ref 3.5–5.0)
Alkaline Phosphatase: 85 U/L (ref 38–126)
Anion gap: 7 (ref 5–15)
BUN: 12 mg/dL (ref 8–23)
CO2: 28 mmol/L (ref 22–32)
Calcium: 8.4 mg/dL — ABNORMAL LOW (ref 8.9–10.3)
Chloride: 104 mmol/L (ref 98–111)
Creatinine, Ser: 0.91 mg/dL (ref 0.44–1.00)
GFR, Estimated: >60 mL/min (ref >60)
Glucose, Bld: 98 mg/dL (ref 70–99)
Potassium: 4.2 mmol/L (ref 3.5–5.1)
Sodium: 139 mmol/L (ref 135–145)
Total Bilirubin: 0.6 mg/dL (ref 0.3–1.2)
Total Protein: 6.7 g/dL (ref 6.5–8.1)

## 2022-02-15 LAB — CBC WITH DIFFERENTIAL/PLATELET
Abs Immature Granulocytes: 0.01 10*3/uL (ref 0.00–0.07)
Basophils Absolute: 0 10*3/uL (ref 0.0–0.1)
Basophils Relative: 0 %
Eosinophils Absolute: 0.3 10*3/uL (ref 0.0–0.5)
Eosinophils Relative: 5 %
HCT: 36.3 % (ref 36.0–46.0)
Hemoglobin: 11.5 g/dL — ABNORMAL LOW (ref 12.0–15.0)
Immature Granulocytes: 0 %
Lymphocytes Relative: 10 %
Lymphs Abs: 0.6 10*3/uL — ABNORMAL LOW (ref 0.7–4.0)
MCH: 28.4 pg (ref 26.0–34.0)
MCHC: 31.7 g/dL (ref 30.0–36.0)
MCV: 89.6 fL (ref 80.0–100.0)
Monocytes Absolute: 0.7 10*3/uL (ref 0.1–1.0)
Monocytes Relative: 12 %
Neutro Abs: 4.2 10*3/uL (ref 1.7–7.7)
Neutrophils Relative %: 73 %
Platelets: 169 10*3/uL (ref 150–400)
RBC: 4.05 MIL/uL (ref 3.87–5.11)
RDW: 12.8 % (ref 11.5–15.5)
WBC: 5.8 10*3/uL (ref 4.0–10.5)
nRBC: 0 % (ref 0.0–0.2)

## 2022-02-15 LAB — CBG MONITORING, ED: Glucose-Capillary: 96 mg/dL (ref 70–99)

## 2022-02-15 LAB — TROPONIN I (HIGH SENSITIVITY): Troponin I (High Sensitivity): 6 ng/L (ref <18)

## 2022-02-15 LAB — LACTIC ACID, PLASMA: Lactic Acid, Venous: 1.3 mmol/L (ref 0.5–1.9)

## 2022-02-15 NOTE — ED Notes (Signed)
Ptar called 

## 2022-02-15 NOTE — ED Provider Notes (Signed)
[facility] EMERGENCY DEPARTMENT Provider Note   CSN: [id number] Arrival date & time: 02/15/22  1459     History  Chief Complaint  Patient presents with   Weakness    Jocelyn Shelton is a 66 y.o. female.  Patient with history of advanced dementia, COPD, CAD, hypertension, and CKD presents today with complaints of weakness and altered mental status. Patient is nonverbal at baseline due to her dementia and therefore history provided by patients daughter April who lives with the patient and is the primary caregiver. States that she was in a nursing home until March 31 with she came to live with the daughter due to insurance issues. Daughter states she is the sole caregiver and is having increasing difficulty managing her as the daughter has her own health problems as well. States that the patient has been generally deconditioning over the past month to the point that now she is completely incontinent of urine and stool and mostly non-ambulatory. States that up until the past few weeks she would walk around with a cane for assistance, and now if she walks she leans over to the right side and will fall. States that she has had several falls but they were all into bed without visualized injury. Daughter states that she has been having to carry the patient back and forth to and from the bathroom. She also endorses concern that she has not been eating hardly at all in the past several weeks and the daughter has been attempted to feed her but she barely eats anything. States that this morning she noticed she was coughing more than normal and grabbed her chest like it was hurting and she was concerned. States that due to her    Level 5 caveat --- patient nonverbal with advanced dementia  The history is provided by the patient. No language interpreter was used.  Weakness      Home Medications Prior to Admission medications   Medication Sig Start Date End Date Taking? Authorizing  Provider  acetaminophen (TYLENOL) 500 MG tablet Take 1,000 mg by mouth every 6 (six) hours as needed for moderate pain or headache.    [provider]  albuterol (PROVENTIL HFA) 108 (90 Base) MCG/ACT inhaler INHALE TWO PUFFS BY MOUTH EVERY 4 HOURS AS NEEDED FOR WHEEZING FOR COUGH FOR SHORTNESS OF BREATH 01/28/22   Littie Deeds, MD  aspirin EC 81 MG EC tablet Take 1 tablet (81 mg total) by mouth daily. 05/24/15   Barrett, Joline Salt, PA-C  Calcium Carb-Cholecalciferol (CALCIUM 600+D3 PO) Take 1 tablet by mouth daily.    [provider]  carvedilol (COREG) 25 MG tablet Take 1 tablet (25 mg total) by mouth 2 (two) times daily with a meal. 11/26/21   Pricilla Riffle, MD  diltiazem (CARDIZEM CD) 180 MG 24 hr capsule Take 1 capsule (180 mg total) by mouth daily. Patient taking differently: Take 180 mg by mouth every evening. 11/26/21   Pricilla Riffle, MD  Doxepin HCl 6 MG TABS TAKE 1 TABLET (6 MG TOTAL) BY MOUTH AT BEDTIME. 12/11/21   Bess Kinds, MD  fluticasone (FLONASE) 50 MCG/ACT nasal spray Place 1 spray into both nostrils daily. 1 spray in each nostril every day Patient taking differently: Place 1 spray into both nostrils daily. 11/13/21   Erick Alley, DO  folic acid (FOLVITE) 1 MG tablet TAKE 1 TABLET (1 MG TOTAL) BY MOUTH DAILY. Patient taking differently: Take 1 mg by mouth daily. 01/22/15   Karamalegos,  Alexander J, DO  ipratropium (ATROVENT) 0.03 % nasal spray Place 2 sprays into both nostrils every 12 (twelve) hours. 01/28/22   Zola Button, MD  memantine (NAMENDA) 10 MG tablet TAKE 1 TABLET BY MOUTH TWICE A DAY 02/12/22   Holley Bouche, MD  Multiple Vitamin (MULTIVITAMIN WITH MINERALS) TABS tablet Take 1 tablet by mouth daily.    [provider]  nitroGLYCERIN (NITROSTAT) 0.4 MG SL tablet Place 1 tablet (0.4 mg total) under the tongue every 5 (five) minutes as needed for chest pain. Please schedule appointment for future refills. 08/18/21   Fay Records, MD  omeprazole  (PRILOSEC) 40 MG capsule Take 40 mg by mouth every evening. 05/13/21   [provider]  predniSONE (DELTASONE) 1 MG tablet Take 1 mg by mouth daily with breakfast. 06/27/18   [provider]  rosuvastatin (CRESTOR) 20 MG tablet Take 1 tablet (20 mg total) by mouth daily. Patient taking differently: Take 20 mg by mouth every evening. 11/24/21   Fay Records, MD  sulfaSALAzine (AZULFIDINE) 500 MG tablet Take 2 tablets (1,000 mg total) by mouth 2 (two) times daily. Patient taking differently: Take 1,000 mg by mouth 2 (two) times daily after a meal. 05/26/17   Harriet Butte, DO  umeclidinium-vilanterol (ANORO ELLIPTA) 62.5-25 MCG/ACT AEPB Inhale 1 puff into the lungs daily. 01/28/22   Zola Button, MD      Allergies    Ticagrelor, Cymbalta [duloxetine hcl], Sulfasalazine, Lisinopril, Olanzapine, and Tomato    Review of Systems   Review of Systems  Unable to perform ROS: Dementia    Physical Exam Updated Vital Signs BP (!) 151/78   Pulse (!) 121   Temp 98.5 F (36.9 C) (Oral)   Resp (!) 22   SpO2 98%  Physical Exam Vitals and nursing note reviewed.  Constitutional:      General: She is not in acute distress.    Appearance: Normal appearance. She is normal weight. She is not ill-appearing, toxic-appearing or diaphoretic.  HENT:     Head: Normocephalic and atraumatic.  Eyes:     Extraocular Movements: Extraocular movements intact.     Pupils: Pupils are equal, round, and reactive to light.  Cardiovascular:     Rate and Rhythm: Normal rate and regular rhythm.     Heart sounds: Normal heart sounds.  Pulmonary:     Effort: Pulmonary effort is normal. No respiratory distress.     Breath sounds: Normal breath sounds.  Abdominal:     General: Abdomen is flat.     Palpations: Abdomen is soft.  Musculoskeletal:        General: Normal range of motion.     Cervical back: Normal range of motion.  Skin:    General: Skin is warm and dry.  Neurological:     Mental Status:  She is alert. Mental status is at baseline.     Comments: Patient not following commands per baseline but is moving all extremities equally  Psychiatric:        Mood and Affect: Mood normal.        Behavior: Behavior normal.     ED Results / Procedures / Treatments   Labs (all labs ordered are listed, but only abnormal results are displayed) Labs Reviewed  COMPREHENSIVE METABOLIC PANEL - Abnormal; Notable for the following components:      Result Value   Calcium 8.4 (*)    Albumin 3.3 (*)    All other components within normal limits  CBC WITH  DIFFERENTIAL/PLATELET - Abnormal; Notable for the following components:   Hemoglobin 11.5 (*)    Lymphs Abs 0.6 (*)    All other components within normal limits  URINE CULTURE  LACTIC ACID, PLASMA  URINALYSIS, ROUTINE W REFLEX MICROSCOPIC  CBG MONITORING, ED  TROPONIN I (HIGH SENSITIVITY)    EKG None  Radiology DG Chest Port 1 View  Result Date: 02/15/2022 CLINICAL DATA:  Altered mental status. EXAM: PORTABLE CHEST 1 VIEW COMPARISON:  12/10/2021. FINDINGS: Stable mild cardiac enlargement. No pleural effusion or edema. Bilateral lower lobe reticular interstitial opacities are identified compatible with known interstitial lung disease. No superimposed airspace consolidation. The visualized osseous structures are unremarkable. IMPRESSION: 1. Chronic bilateral lower lobe interstitial lung disease. 2. No acute cardiopulmonary abnormalities. Electronically Signed   By: Signa Kell M.D.   On: 02/15/2022 16:20   CT Head Wo Contrast  Result Date: 02/15/2022 CLINICAL DATA:  Mental status change.  Failure to thrive.  Weakness. EXAM: CT HEAD WITHOUT CONTRAST TECHNIQUE: Contiguous axial images were obtained from the base of the skull through the vertex without intravenous contrast. RADIATION DOSE REDUCTION: This exam was performed according to the departmental dose-optimization program which includes automated exposure control, adjustment of the mA  and/or kV according to patient size and/or use of iterative reconstruction technique. COMPARISON:  08/01/2021. FINDINGS: Brain: No evidence of acute infarction, hemorrhage, hydrocephalus, extra-axial collection or mass lesion/mass effect. There is ventricular sulcal enlargement consistent with mild atrophy, similar to the prior exam. Mild patchy areas of white matter hypoattenuation are also noted also stable consistent with chronic microvascular ischemic change. Vascular: No hyperdense vessel or unexpected calcification. Skull: Normal. Negative for fracture or focal lesion. Sinuses/Orbits: Globes and orbits are unremarkable. Visualized sinuses are clear. Other: None. IMPRESSION: 1. No acute intracranial abnormalities. No change from the prior study. Electronically Signed   By: Amie Portland M.D.   On: 02/15/2022 16:16    Procedures Procedures    Medications Ordered in ED Medications - No data to display  ED Course/ Medical Decision Making/ A&P                           Medical Decision Making  This patient presents to the ED for concern of weakness and altered mental status, this involves an extensive number of treatment options, and is a complaint that carries with it a high risk of complications and morbidity.  The differential diagnosis includes Dementia, UTI, CVA, ACS. This is not an exhaustive differential   Co morbidities that complicate the patient evaluation  Hx advanced dementia nonverbal at baseline   Additional history obtained:  Additional history obtained from epic chart review   Lab Tests:  I Ordered, and personally interpreted labs.  The pertinent results include:  hgb 11.5 unchanged from previous.  No other acute laboratory findings   Imaging Studies ordered:  I ordered imaging studies including CT head, DG chest  I independently visualized and interpreted imaging which showed  CT: no acute findings CXR: no acute findings I agree with the radiologist  interpretation   Cardiac Monitoring: / EKG:  The patient was maintained on a cardiac monitor.  I personally viewed and interpreted the cardiac monitored which showed an underlying rhythm of: no STEMI   Test / Admission - Considered:  Patient presents today at the request of her daughter who is the primary caregiver with complaints of generalized weakness and deconditioning. Patient is afebrile, nontoxic-appearing, and in no acute distress  with reassuring vital signs.  Patient is nonverbal  and does not follow commands per baseline but is awake and moving all extremities normally and will track me in the room as I move.  I am unable to express any areas of tenderness or pain.  Laboratory evaluation and imaging is overall normal, no signs of an infection, electrolyte abnormalities, ACS, pneumonia, UTI, liver failure, CKD, or any other emergent concerns that could be related to the patient's condition.  Given this, I suspect that the patient's symptoms are related to generalized deconditioning related to advanced dementia.  I have discussed this with the daughter April who is the sole caregiver of the patient.  I have also further discussed expectations related to progression of disease which often include increasing incontinence, increasing mobility issues, and decreasing food and water intake which appears to be the daughter's main concerns.  Given her overall unremarkable work-up, she is stable for discharge at this time.  Emphasized the importance of close PCP follow-up for continued evaluation and management of the patients condition. Also educated on red flag symptoms that would prompt immediate return. Discharged in stable condition.   This is a shared visit with supervising physician Dr. Tamera Punt who has independently evaluated patient & provided guidance in evaluation/management/disposition, in agreement with care     Final Clinical Impression(s) / ED Diagnoses Final diagnoses:  Generalized  weakness  Physical deconditioning  Severe dementia without behavioral disturbance, psychotic disturbance, mood disturbance, or anxiety, unspecified dementia type ([place])    Rx / DC Orders ED Discharge Orders     None     An After Visit Summary was printed and given to the patient.     Nestor Lewandowsky 02/15/22 1947    Malvin Johns, MD 02/15/22 2056

## 2022-02-15 NOTE — ED Triage Notes (Signed)
Pt BIB [facility] from home d/t failure to thrive & weakness that has gotten worse. Lives with daughter & is reported to have NOT eaten in several days. Is nonverbal at baseline. 160/90, 98% on RA, 70 bpm, resp 18, CBG 102.

## 2022-02-15 NOTE — Discharge Instructions (Signed)
EyesAs we discussed over the phone, Jocelyn Shelton' work-up in the ER was reassuring for acute abnormalities.  Given this, I suspect that her symptoms are related to a progression of her dementia.  Unfortunately at this does include many of the symptoms you are describing such as increasing incontinence, increasing mobility issues, and decreasing food and water intake.  I recommend that you follow-up with her PCP in the next few days for continued evaluation and management of her symptoms.  Return if development of any new or worsening symptoms.

## 2022-02-15 NOTE — ED Notes (Signed)
Discussed pts discharge with daughter. Pt's caregiver verbalized understanding with no additional questions at this time. Daughter verbalized concerns in caring for pt with increasing needs. SW/CM order placed. Pt's daughter agreeing to accept pt back. PTAR at bedside to transport pt home.

## 2022-02-15 NOTE — ED Notes (Signed)
Report received from [place], [place]. All questions answered. Patient resting in bed.

## 2022-02-15 NOTE — ED Notes (Signed)
Patient transported to CT 

## 2022-02-16 LAB — URINE CULTURE: Culture: NO GROWTH

## 2022-02-17 ENCOUNTER — Telehealth: Payer: Self-pay

## 2022-02-17 DIAGNOSIS — F03B0 Unspecified dementia, moderate, without behavioral disturbance, psychotic disturbance, mood disturbance, and anxiety: Secondary | ICD-10-CM

## 2022-02-17 DIAGNOSIS — Z599 Problem related to housing and economic circumstances, unspecified: Secondary | ICD-10-CM

## 2022-02-17 NOTE — Telephone Encounter (Signed)
Hey,  I've never seen this patient in clinic, but they were seen in the clinic on 01/28/22. What am I placing a social work referral for? It seems like I need to place an order for [name] with social work. What are they hoping to get from a social work referral?

## 2022-02-17 NOTE — Telephone Encounter (Signed)
Patients daughter calls nurse line requesting a social work referral.   Jocelyn Shelton with [name] and [name] needs to be placed for social work specifically.   Daughter hopes discuss options for keep her mother in the home with help or placement.   Will forward to PCP and provider who saw patient recently.

## 2022-02-18 NOTE — Telephone Encounter (Signed)
New [name] referral placed for social work

## 2022-02-23 ENCOUNTER — Emergency Department (HOSPITAL_COMMUNITY): Payer: Medicare Other

## 2022-02-23 ENCOUNTER — Encounter (HOSPITAL_COMMUNITY): Payer: Self-pay | Admitting: Family Medicine

## 2022-02-23 ENCOUNTER — Inpatient Hospital Stay (HOSPITAL_COMMUNITY)
Admission: EM | Admit: 2022-02-23 | Discharge: 2022-02-26 | DRG: 884 | Disposition: A | Discharge status: Hospice | Payer: Medicare Other | Attending: Family Medicine | Admitting: Family Medicine

## 2022-02-23 DIAGNOSIS — Z87891 Personal history of nicotine dependence: Secondary | ICD-10-CM

## 2022-02-23 DIAGNOSIS — L89151 Pressure ulcer of sacral region, stage 1: Secondary | ICD-10-CM | POA: Diagnosis present

## 2022-02-23 DIAGNOSIS — J449 Chronic obstructive pulmonary disease, unspecified: Secondary | ICD-10-CM | POA: Diagnosis present

## 2022-02-23 DIAGNOSIS — E860 Dehydration: Secondary | ICD-10-CM

## 2022-02-23 DIAGNOSIS — Z8249 Family history of ischemic heart disease and other diseases of the circulatory system: Secondary | ICD-10-CM

## 2022-02-23 DIAGNOSIS — Z801 Family history of malignant neoplasm of trachea, bronchus and lung: Secondary | ICD-10-CM | POA: Diagnosis not present

## 2022-02-23 DIAGNOSIS — Z91018 Allergy to other foods: Secondary | ICD-10-CM

## 2022-02-23 DIAGNOSIS — F339 Major depressive disorder, recurrent, unspecified: Secondary | ICD-10-CM | POA: Diagnosis present

## 2022-02-23 DIAGNOSIS — R54 Age-related physical debility: Secondary | ICD-10-CM | POA: Diagnosis present

## 2022-02-23 DIAGNOSIS — I252 Old myocardial infarction: Secondary | ICD-10-CM

## 2022-02-23 DIAGNOSIS — Z7982 Long term (current) use of aspirin: Secondary | ICD-10-CM | POA: Diagnosis not present

## 2022-02-23 DIAGNOSIS — K219 Gastro-esophageal reflux disease without esophagitis: Secondary | ICD-10-CM | POA: Diagnosis not present

## 2022-02-23 DIAGNOSIS — Z7189 Other specified counseling: Secondary | ICD-10-CM | POA: Diagnosis not present

## 2022-02-23 DIAGNOSIS — Z8673 Personal history of transient ischemic attack (TIA), and cerebral infarction without residual deficits: Secondary | ICD-10-CM | POA: Diagnosis not present

## 2022-02-23 DIAGNOSIS — I110 Hypertensive heart disease with heart failure: Secondary | ICD-10-CM | POA: Diagnosis not present

## 2022-02-23 DIAGNOSIS — L8941 Pressure ulcer of contiguous site of back, buttock and hip, stage 1: Secondary | ICD-10-CM

## 2022-02-23 DIAGNOSIS — F03B0 Unspecified dementia, moderate, without behavioral disturbance, psychotic disturbance, mood disturbance, and anxiety: Secondary | ICD-10-CM | POA: Diagnosis present

## 2022-02-23 DIAGNOSIS — I7300 Raynaud's syndrome without gangrene: Secondary | ICD-10-CM | POA: Diagnosis present

## 2022-02-23 DIAGNOSIS — Z833 Family history of diabetes mellitus: Secondary | ICD-10-CM

## 2022-02-23 DIAGNOSIS — R6889 Other general symptoms and signs: Secondary | ICD-10-CM | POA: Diagnosis not present

## 2022-02-23 DIAGNOSIS — Z66 Do not resuscitate: Secondary | ICD-10-CM | POA: Diagnosis present

## 2022-02-23 DIAGNOSIS — R4182 Altered mental status, unspecified: Secondary | ICD-10-CM | POA: Diagnosis not present

## 2022-02-23 DIAGNOSIS — Z515 Encounter for palliative care: Secondary | ICD-10-CM | POA: Diagnosis not present

## 2022-02-23 DIAGNOSIS — F03C0 Unspecified dementia, severe, without behavioral disturbance, psychotic disturbance, mood disturbance, and anxiety: Secondary | ICD-10-CM

## 2022-02-23 DIAGNOSIS — R262 Difficulty in walking, not elsewhere classified: Secondary | ICD-10-CM | POA: Diagnosis present

## 2022-02-23 DIAGNOSIS — M349 Systemic sclerosis, unspecified: Secondary | ICD-10-CM | POA: Diagnosis present

## 2022-02-23 DIAGNOSIS — I959 Hypotension, unspecified: Secondary | ICD-10-CM | POA: Diagnosis not present

## 2022-02-23 DIAGNOSIS — J439 Emphysema, unspecified: Secondary | ICD-10-CM | POA: Diagnosis not present

## 2022-02-23 DIAGNOSIS — R159 Full incontinence of feces: Secondary | ICD-10-CM | POA: Diagnosis present

## 2022-02-23 DIAGNOSIS — M069 Rheumatoid arthritis, unspecified: Secondary | ICD-10-CM | POA: Diagnosis not present

## 2022-02-23 DIAGNOSIS — Z7401 Bed confinement status: Secondary | ICD-10-CM | POA: Diagnosis not present

## 2022-02-23 DIAGNOSIS — E7800 Pure hypercholesterolemia, unspecified: Secondary | ICD-10-CM | POA: Diagnosis not present

## 2022-02-23 DIAGNOSIS — Z79899 Other long term (current) drug therapy: Secondary | ICD-10-CM

## 2022-02-23 DIAGNOSIS — I5032 Chronic diastolic (congestive) heart failure: Secondary | ICD-10-CM | POA: Diagnosis not present

## 2022-02-23 DIAGNOSIS — R627 Adult failure to thrive: Secondary | ICD-10-CM | POA: Diagnosis not present

## 2022-02-23 DIAGNOSIS — Z888 Allergy status to other drugs, medicaments and biological substances status: Secondary | ICD-10-CM

## 2022-02-23 DIAGNOSIS — I10 Essential (primary) hypertension: Secondary | ICD-10-CM | POA: Diagnosis not present

## 2022-02-23 DIAGNOSIS — I2510 Atherosclerotic heart disease of native coronary artery without angina pectoris: Secondary | ICD-10-CM | POA: Diagnosis not present

## 2022-02-23 DIAGNOSIS — F03C3 Unspecified dementia, severe, with mood disturbance: Secondary | ICD-10-CM | POA: Diagnosis not present

## 2022-02-23 DIAGNOSIS — L89312 Pressure ulcer of right buttock, stage 2: Secondary | ICD-10-CM | POA: Diagnosis present

## 2022-02-23 LAB — PROTIME-INR
INR: 1.3 — ABNORMAL HIGH (ref 0.8–1.2)
Prothrombin Time: 15.6 seconds — ABNORMAL HIGH (ref 11.4–15.2)

## 2022-02-23 LAB — CBC WITH DIFFERENTIAL/PLATELET
Abs Immature Granulocytes: 0.02 10*3/uL (ref 0.00–0.07)
Basophils Absolute: 0 10*3/uL (ref 0.0–0.1)
Basophils Relative: 1 %
Eosinophils Absolute: 0.4 10*3/uL (ref 0.0–0.5)
Eosinophils Relative: 8 %
HCT: 33.8 % — ABNORMAL LOW (ref 36.0–46.0)
Hemoglobin: 10.7 g/dL — ABNORMAL LOW (ref 12.0–15.0)
Immature Granulocytes: 0 %
Lymphocytes Relative: 13 %
Lymphs Abs: 0.7 10*3/uL (ref 0.7–4.0)
MCH: 28.4 pg (ref 26.0–34.0)
MCHC: 31.7 g/dL (ref 30.0–36.0)
MCV: 89.7 fL (ref 80.0–100.0)
Monocytes Absolute: 0.4 10*3/uL (ref 0.1–1.0)
Monocytes Relative: 8 %
Neutro Abs: 3.7 10*3/uL (ref 1.7–7.7)
Neutrophils Relative %: 70 %
Platelets: 202 10*3/uL (ref 150–400)
RBC: 3.77 MIL/uL — ABNORMAL LOW (ref 3.87–5.11)
RDW: 12.9 % (ref 11.5–15.5)
WBC: 5.2 10*3/uL (ref 4.0–10.5)
nRBC: 0 % (ref 0.0–0.2)

## 2022-02-23 LAB — COMPREHENSIVE METABOLIC PANEL
ALT: 33 U/L (ref 0–44)
AST: 52 U/L — ABNORMAL HIGH (ref 15–41)
Albumin: 3.1 g/dL — ABNORMAL LOW (ref 3.5–5.0)
Alkaline Phosphatase: 98 U/L (ref 38–126)
Anion gap: 8 (ref 5–15)
BUN: 14 mg/dL (ref 8–23)
CO2: 27 mmol/L (ref 22–32)
Calcium: 8.9 mg/dL (ref 8.9–10.3)
Chloride: 104 mmol/L (ref 98–111)
Creatinine, Ser: 1.12 mg/dL — ABNORMAL HIGH (ref 0.44–1.00)
GFR, Estimated: 54 mL/min — ABNORMAL LOW (ref >60)
Glucose, Bld: 128 mg/dL — ABNORMAL HIGH (ref 70–99)
Potassium: 3.7 mmol/L (ref 3.5–5.1)
Sodium: 139 mmol/L (ref 135–145)
Total Bilirubin: 0.7 mg/dL (ref 0.3–1.2)
Total Protein: 7.5 g/dL (ref 6.5–8.1)

## 2022-02-23 LAB — APTT: aPTT: 27 seconds (ref 24–36)

## 2022-02-23 LAB — TSH: TSH: 2.063 u[IU]/mL (ref 0.350–4.500)

## 2022-02-23 LAB — I-STAT CHEM 8, ED
BUN: 15 mg/dL (ref 8–23)
Calcium, Ion: 1.15 mmol/L (ref 1.15–1.40)
Chloride: 101 mmol/L (ref 98–111)
Creatinine, Ser: 1.1 mg/dL — ABNORMAL HIGH (ref 0.44–1.00)
Glucose, Bld: 129 mg/dL — ABNORMAL HIGH (ref 70–99)
HCT: 33 % — ABNORMAL LOW (ref 36.0–46.0)
Hemoglobin: 11.2 g/dL — ABNORMAL LOW (ref 12.0–15.0)
Potassium: 3.8 mmol/L (ref 3.5–5.1)
Sodium: 141 mmol/L (ref 135–145)
TCO2: 27 mmol/L (ref 22–32)

## 2022-02-23 LAB — FOLATE: Folate: 32.3 ng/mL (ref >5.9)

## 2022-02-23 LAB — AMMONIA: Ammonia: <10 umol/L (ref 9–35)

## 2022-02-23 LAB — CBG MONITORING, ED: Glucose-Capillary: 114 mg/dL — ABNORMAL HIGH (ref 70–99)

## 2022-02-23 LAB — VITAMIN B12: Vitamin B-12: 996 pg/mL — ABNORMAL HIGH (ref 180–914)

## 2022-02-23 LAB — LACTIC ACID, PLASMA: Lactic Acid, Venous: 1.7 mmol/L (ref 0.5–1.9)

## 2022-02-23 MED ORDER — CARVEDILOL 25 MG PO TABS
25.0000 mg | ORAL_TABLET | Freq: Two times a day (BID) | ORAL | Status: DC
  Administered 2022-02-23 – 2022-02-26 (×6): 25 mg via ORAL
  Filled 2022-02-23 (×3): qty 1
  Filled 2022-02-23: qty 2
  Filled 2022-02-23 (×2): qty 1

## 2022-02-23 MED ORDER — ROSUVASTATIN CALCIUM 20 MG PO TABS
20.0000 mg | ORAL_TABLET | Freq: Every day | ORAL | Status: DC
  Administered 2022-02-24 – 2022-02-26 (×3): 20 mg via ORAL
  Filled 2022-02-23 (×3): qty 1

## 2022-02-23 MED ORDER — DOXEPIN HCL 10 MG PO CAPS
10.0000 mg | ORAL_CAPSULE | Freq: Every day | ORAL | Status: DC
Start: 2022-02-23 — End: 2022-02-24
  Filled 2022-02-23 (×2): qty 1

## 2022-02-23 MED ORDER — ASPIRIN 81 MG PO TBEC
81.0000 mg | DELAYED_RELEASE_TABLET | Freq: Every day | ORAL | Status: DC
  Administered 2022-02-23 – 2022-02-26 (×4): 81 mg via ORAL
  Filled 2022-02-23 (×4): qty 1

## 2022-02-23 MED ORDER — ENOXAPARIN SODIUM 40 MG/0.4ML IJ SOSY
40.0000 mg | PREFILLED_SYRINGE | INTRAMUSCULAR | Status: DC
  Administered 2022-02-24 – 2022-02-26 (×3): 40 mg via SUBCUTANEOUS
  Filled 2022-02-23 (×3): qty 0.4

## 2022-02-23 MED ORDER — FLUTICASONE PROPIONATE 50 MCG/ACT NA SUSP
1.0000 | Freq: Every day | NASAL | Status: DC
  Administered 2022-02-24 – 2022-02-26 (×3): 1 via NASAL
  Filled 2022-02-23: qty 16

## 2022-02-23 MED ORDER — LACTATED RINGERS IV BOLUS
1000.0000 mL | Freq: Once | INTRAVENOUS | Status: AC
  Administered 2022-02-23: 1000 mL via INTRAVENOUS

## 2022-02-23 MED ORDER — ACETAMINOPHEN 500 MG PO TABS: 1000.0000 mg | ORAL_TABLET | Freq: Four times a day (QID) | ORAL | Status: DC | PRN

## 2022-02-23 MED ORDER — ALBUTEROL SULFATE (2.5 MG/3ML) 0.083% IN NEBU
2.5000 mg | INHALATION_SOLUTION | Freq: Four times a day (QID) | RESPIRATORY_TRACT | Status: DC | PRN
Start: 2022-02-23 — End: 2022-02-26

## 2022-02-23 MED ORDER — ALBUTEROL SULFATE HFA 108 (90 BASE) MCG/ACT IN AERS: 1.0000 | INHALATION_SPRAY | Freq: Four times a day (QID) | RESPIRATORY_TRACT | Status: DC | PRN

## 2022-02-23 MED ORDER — FOLIC ACID 1 MG PO TABS
1.0000 mg | ORAL_TABLET | Freq: Every day | ORAL | Status: DC
  Administered 2022-02-24 – 2022-02-26 (×3): 1 mg via ORAL
  Filled 2022-02-23 (×4): qty 1

## 2022-02-23 MED ORDER — UMECLIDINIUM-VILANTEROL 62.5-25 MCG/ACT IN AEPB
1.0000 | INHALATION_SPRAY | Freq: Every day | RESPIRATORY_TRACT | Status: DC
Start: 2022-02-24 — End: 2022-02-26
  Administered 2022-02-24 – 2022-02-25 (×2): 1 via RESPIRATORY_TRACT
  Filled 2022-02-23: qty 14

## 2022-02-23 MED ORDER — DOXEPIN HCL 6 MG PO TABS: 6.0000 mg | ORAL_TABLET | Freq: Every day | ORAL | Status: DC

## 2022-02-23 MED ORDER — PANTOPRAZOLE SODIUM 40 MG PO TBEC
40.0000 mg | DELAYED_RELEASE_TABLET | Freq: Every day | ORAL | Status: DC
  Administered 2022-02-23 – 2022-02-26 (×4): 40 mg via ORAL
  Filled 2022-02-23 (×4): qty 1

## 2022-02-23 MED ORDER — SODIUM CHLORIDE 0.9 % IV SOLN: INTRAVENOUS | Status: DC

## 2022-02-23 MED ORDER — ENOXAPARIN SODIUM 40 MG/0.4ML IJ SOSY: 40.0000 mg | PREFILLED_SYRINGE | INTRAMUSCULAR | Status: DC

## 2022-02-23 NOTE — H&P (Cosign Needed Addendum)
Hospital Admission History and Physical Service Pager: [phone]  Patient name: Jocelyn Shelton Medical record number: [id number] Date of Birth: March 03, 1956 Age: 66 y.o. Gender: female  Primary Care Provider: Holley Bouche, MD Consultants: none Code Status: DNR, which was confirmed by daughter and paperwork in chart Preferred Emergency Contact:   Name Relation Home Work [place] Daughter [phone]      Chief Complaint: altered mental status  Assessment and Plan: Jocelyn Shelton is a 66 y.o. female presenting with altered mental status . Differential for this patient's presentation of this includes dementia, delirium, metabolic abnormalities, stroke, toxin, and seizure. Likely progression of dementia indicated by the progressive decline in functional status per the daughter. Cannot rule out delirium, but difficult to assess with patient not speaking. Electrolyte lab work is negative for cause of encephalopathy, and imaging of head and thoracic cavity is negative for an acute process.  * AMS (altered mental status) Likely chronic due to progression of dementia, but cannot exclude neurologic pathology yet given acute decline.  Lab work and imaging negative thus far for acute pathology of altered mental status. - Admit to family medicine teaching service, [place], attending Dr. Mallie Mussel - Follow-up MRI brain - Follow-up TSH, thiamine, B9, B12, ammonia, UA, urine culture, blood cultures - Palliative care consult - PT/OT eval and treat - Vitals per floor - Cardiac monitoring x12 hours - Delirium precautions - Fall precautions - SLP evaluation   Moderate dementia ([place]) Feel presentation most likely related to worsening dementia, but evaluating for other etiologies given acute decline (see above). Nonverbal and with new inability to walk and decreased oral intake. - IVF NS @75mL /hr  - Soft diet as able - Assist with feeds - Crush meds in applesauce for  administration - Holding Namenda - Palliative consult for goals of care - TOC for SNF as family cannot care for her  stage 1 pressure ulcer See picture in media tab. - Wound care consult - Dressing changes ordered  Coronary artery disease involving native coronary artery of native heart without angina pectoris EKG today unchanged from prior. Last ECHO from [id number] grade 2 diastolic dysfunction, EF 0000000. --Continue Aspirin 81mg  daily --Continue Crestor 20mg  daily  COPD (chronic obstructive pulmonary disease) (HCC) --Continue Albuterol  --Continue Anoro Ellipta  Hypertension Hypotensive on arrival with good response to 1L bolus. Per daughter patient not taking Diltiazem (uses PRN for BP) since early July. -- Continue Coreg 25mg  BID -- Monitor BP with vitals  Major depressive disorder, recurrent episode ([address]) Unable assess status as patient is non-verbal -Continue Doxepin 6mg  QHS      FEN/GI: mechanical soft, pending SLP eval VTE Prophylaxis: Lovenox  Disposition: med surg  History of Present Illness:  Jocelyn Shelton is a 66 y.o. female presenting with altered mental status.  Patient non-verbal. History provided by daughter over the phone.  Daughter states she was told to bring her mother to the ER by her PCP office because her mother hasn't been drinking/eating or walking, and has a sore on her bottom.  When she left hospital in January she went to [facility]. She was not able to walk and talk and that time. During the 3 months when she is was in [facility] she was able to walk up the steps and had started eating. Patient states she stopped eating and drinking "all of sudden" a few weeks after her last appointment in early July. She used to help with her bathing but  has unable to even been able to do that. She was not using any of her own muscles. The daughter is overwhelmed with trying to take care of her and feels like she doesn't know what to do to help her mother at  this point.  The last time she drank/ate was yesterday with some protein shake and applesauce. She was able eat her foods, and take her medications (minus her inhalers). Daughter states that these changes happened over the course of a couple weeks. Daughter states that she was approved for long-term care facility on 12/04/21.    In the ED, patient was hypotensive to 68/43, but responded to 1L of LR fluids. Labs significant for Albumin 3.1, Hg 10.7. CT head no acute pathologies.   Review Of Systems: Per HPI.  Pertinent Past Medical History: COPD Hypertension - Blood pressure at home 132/87. Patient not on diltiazem since early July.  MI RA/Schleroderma/Raynaud's CAD Remainder reviewed in history tab.   Pertinent Past Surgical History: Cardiac cath in 2016 Remainder reviewed in history tab.   Pertinent Social History: Tobacco use: Former 23 pk year history Alcohol use: unable to inquire, per chart 4 drinks per week but do not feel this is still applicable Other Substance use: unable to inquire, per chart none Lives with daughter who is caretaker.  Pertinent Family History: Remainder reviewed in history tab.   Important Outpatient Medications: Uses Tylenol Remainder reviewed in medication history.   Objective: BP (!) 167/100   Pulse 84   Temp 98.3 F (36.8 C) (Oral)   Resp 18   SpO2 100%  Exam: General: NAD, patient not speaking, lying in hospital bed HEENT: No sign of trauma, EOM grossly intact, PERRLA Cardiac: RRR, no m/r/g Respiratory: CTAB, normal WOB, no w/c/r GI: Soft, NTTP, non-distended Extremities: NTTP, no peripheral edema. Neuro: moves all four extremities appropriately, responds to request to breath in, and displays feet for examination  Sacrum: small wound present in right inter-gluteal cleft, no obvious drainage present at time of inspection, no fluctuance palpated    Labs:  CBC BMET  Recent Labs  Lab 02/23/22 1655 02/23/22 1733  WBC 5.2  --    HGB 10.7* 11.2*  HCT 33.8* 33.0*  PLT 202  --    Recent Labs  Lab 02/23/22 1655 02/23/22 1733  NA 139 141  K 3.7 3.8  CL 104 101  CO2 27  --   BUN 14 15  CREATININE 1.12* 1.10*  GLUCOSE 128* 129*  CALCIUM 8.9  --     Pertinent additional labs: LA 1.7.  EKG: My own interpretation (not copied from electronic read): normal sinus, regular axis, no new ST changes    Imaging Studies Performed:  Imaging Study (ie. Chest x-ray) Impression from Radiologist:   CXR 02/23/22 IMPRESSION: No active disease.  Chronic lung disease at the bases   My Interpretation: Possible cardiomegaly, no obvious acute lung processes  CT Head Wo Contrast 02/23/22 IMPRESSION: 1. No acute intracranial pathology. 2. Mild age-related atrophy and chronic microvascular ischemic changes.  Celine Mans, MD 02/23/2022, 10:31 PM PGY-1, [facility]  FPTS Intern pager: [phone], text pages welcome Secure chat group [facility] Teaching Service       FPTS Upper-Level Resident Addendum   I have independently interviewed and examined the patient. I have discussed the above with the original author and agree with their documentation. My edits for correction/addition/clarification are in within the document. Please see also any attending notes.   Percival Spanish  Clayborne Artist DO  PGY-3, [facility] 02/23/2022 10:31 PM  FPTS Service pager: [phone] (text pages welcome through [facility])

## 2022-02-23 NOTE — Assessment & Plan Note (Addendum)
EKG today unchanged from prior. Last ECHO from 06/17/17 grade 2 diastolic dysfunction, EF 55-60%. --Continue Aspirin 81mg  daily --Continue Crestor 20mg  daily

## 2022-02-23 NOTE — ED Provider Notes (Signed)
[facility] EMERGENCY DEPARTMENT Provider Note   CSN: [id number] Arrival date & time: 02/23/22  1633     History  Chief Complaint  Patient presents with  . Altered Mental Status    Jocelyn Shelton is a 66 y.o. female with history of dementia, COPD, hypertension, scleroderma presenting to the emergency department with altered mental status.  Patient's daughter reports that patient has been nonverbal as her dementia has worsened, but has been able to walk, eat and drink, but in the last week or so, the patient has been not walking, refusing to eat or drink when presented with food or water.  Patient's daughter reports that she also is not able to lift her mother, and thinks her mother is developed a sore on her bottom.  No fevers, cough, trouble breathing, vomiting, diarrhea.  The daughter is concerned that she can no longer care for the patient at home.   Altered Mental Status      Home Medications Prior to Admission medications   Medication Sig Start Date End Date Taking? Authorizing Provider  acetaminophen (TYLENOL) 500 MG tablet Take 1,000 mg by mouth every 6 (six) hours as needed for moderate pain or headache.   Yes [provider]  albuterol (PROVENTIL HFA) 108 (90 Base) MCG/ACT inhaler INHALE TWO PUFFS BY MOUTH EVERY 4 HOURS AS NEEDED FOR WHEEZING FOR COUGH FOR SHORTNESS OF BREATH Patient taking differently: Inhale 2 puffs into the lungs every 6 (six) hours as needed for shortness of breath. 01/28/22  Yes Littie Deeds, MD  aspirin EC 81 MG EC tablet Take 1 tablet (81 mg total) by mouth daily. 05/24/15  Yes Barrett, Joline Salt, PA-C  Calcium Carb-Cholecalciferol (CALCIUM 600+D3 PO) Take 1 tablet by mouth daily.   Yes [provider]  carvedilol (COREG) 25 MG tablet Take 1 tablet (25 mg total) by mouth 2 (two) times daily with a meal. 11/26/21  Yes Pricilla Riffle, MD  dextromethorphan-guaiFENesin [facility] DM) 30-600 MG 12hr tablet Take 1 tablet by mouth  2 (two) times daily as needed for cough.   Yes [provider]  Doxepin HCl 6 MG TABS TAKE 1 TABLET (6 MG TOTAL) BY MOUTH AT BEDTIME. 12/11/21  Yes Sowell, Apolinar Junes, MD  ipratropium (ATROVENT) 0.03 % nasal spray Place 2 sprays into both nostrils every 12 (twelve) hours. 01/28/22  Yes Littie Deeds, MD  memantine (NAMENDA) 10 MG tablet TAKE 1 TABLET BY MOUTH TWICE A DAY Patient taking differently: Take 10 mg by mouth 2 (two) times daily. 02/12/22  Yes Sowell, Apolinar Junes, MD  Multiple Vitamin (MULTIVITAMIN WITH MINERALS) TABS tablet Take 1 tablet by mouth daily.   Yes [provider]  nitroGLYCERIN (NITROSTAT) 0.4 MG SL tablet Place 1 tablet (0.4 mg total) under the tongue every 5 (five) minutes as needed for chest pain. Please schedule appointment for future refills. 08/18/21  Yes Pricilla Riffle, MD  omeprazole (PRILOSEC) 40 MG capsule Take 40 mg by mouth every evening. 05/13/21  Yes [provider]  rosuvastatin (CRESTOR) 20 MG tablet Take 1 tablet (20 mg total) by mouth daily. Patient taking differently: Take 20 mg by mouth every evening. 11/24/21  Yes Pricilla Riffle, MD  umeclidinium-vilanterol [facility] ELLIPTA) 62.5-25 MCG/ACT AEPB Inhale 1 puff into the lungs daily. 01/28/22  Yes Littie Deeds, MD  diltiazem (CARDIZEM CD) 180 MG 24 hr capsule Take 1 capsule (180 mg total) by mouth daily. Patient not taking: Reported on 02/23/2022 11/26/21   Pricilla Riffle, MD  fluticasone (FLONASE) 50 MCG/ACT nasal spray Place 1 spray into both nostrils daily. 1 spray in each nostril every day Patient not taking: Reported on 02/23/2022 11/13/21   Erick Alley, DO  folic acid (FOLVITE) 1 MG tablet TAKE 1 TABLET (1 MG TOTAL) BY MOUTH DAILY. Patient not taking: Reported on 02/15/2022 01/22/15   Smitty Cords, DO      Allergies    Ticagrelor, Cymbalta [duloxetine hcl], Sulfasalazine, Lisinopril, Olanzapine, and Tomato    Review of Systems   Review of Systems  Unable to perform ROS: Dementia     Physical Exam Updated Vital Signs BP (!) 167/100   Pulse 84   Temp 98.3 F (36.8 C) (Oral)   Resp 18   SpO2 100%  Physical Exam Constitutional:      General: She is not in acute distress.    Appearance: She is not toxic-appearing.  HENT:     Head: Normocephalic and atraumatic.     Right Ear: External ear normal.     Left Ear: External ear normal.     Mouth/Throat:     Mouth: Mucous membranes are dry.  Eyes:     Pupils: Pupils are equal, round, and reactive to light.  Cardiovascular:     Rate and Rhythm: Normal rate and regular rhythm.     Heart sounds: No murmur heard. Pulmonary:     Effort: Pulmonary effort is normal. No respiratory distress.     Breath sounds: Normal breath sounds.  Abdominal:     General: Abdomen is flat.     Palpations: Abdomen is soft.     Tenderness: There is no abdominal tenderness.  Genitourinary:    Comments: Chaperoned by nursing, small stage II sacral decubitus ulcer Musculoskeletal:        General: No swelling.     Right lower leg: No edema.     Left lower leg: No edema.  Skin:    General: Skin is warm and dry.  Neurological:     Mental Status: She is alert.     Comments: Tracks interviewer around room, withdraws from pain in all 4 extremities with some spontaneous movement     ED Results / Procedures / Treatments   Labs (all labs ordered are listed, but only abnormal results are displayed) Labs Reviewed  COMPREHENSIVE METABOLIC PANEL - Abnormal; Notable for the following components:      Result Value   Glucose, Bld 128 (*)    Creatinine, Ser 1.12 (*)    Albumin 3.1 (*)    AST 52 (*)    GFR, Estimated 54 (*)    All other components within normal limits  CBC WITH DIFFERENTIAL/PLATELET - Abnormal; Notable for the following components:   RBC 3.77 (*)    Hemoglobin 10.7 (*)    HCT 33.8 (*)    All other components within normal limits  PROTIME-INR - Abnormal; Notable for the following components:   Prothrombin Time 15.6 (*)     INR 1.3 (*)    All other components within normal limits  I-STAT CHEM 8, ED - Abnormal; Notable for the following components:   Creatinine, Ser 1.10 (*)    Glucose, Bld 129 (*)    Hemoglobin 11.2 (*)    HCT 33.0 (*)    All other components within normal limits  CBG MONITORING, ED - Abnormal; Notable for the following components:   Glucose-Capillary 114 (*)    All other components within normal limits  RESP PANEL BY RT-PCR (FLU A&B, COVID)  ARPGX2  CULTURE, BLOOD (ROUTINE X 2)  CULTURE, BLOOD (ROUTINE X 2)  URINE CULTURE  LACTIC ACID, PLASMA  APTT  URINALYSIS, ROUTINE W REFLEX MICROSCOPIC  AMMONIA  VITAMIN B12  FOLATE  COMPREHENSIVE METABOLIC PANEL  CBC  TSH  VITAMIN B1    EKG EKG Interpretation  Date/Time:  Monday February 23 2022 17:04:01 EDT Ventricular Rate:  73 PR Interval:  120 QRS Duration: 78 QT Interval:  451 QTC Calculation: 497 R Axis:   86 Text Interpretation: Sinus rhythm Atrial premature complex Borderline right axis deviation Borderline prolonged QT interval Confirmed by Garnette Gunner [phone]) on 02/23/2022 5:07:31 PM  Radiology CT Head Wo Contrast  Result Date: 02/23/2022 CLINICAL DATA:  Altered mental status and delirium. EXAM: CT HEAD WITHOUT CONTRAST TECHNIQUE: Contiguous axial images were obtained from the base of the skull through the vertex without intravenous contrast. RADIATION DOSE REDUCTION: This exam was performed according to the departmental dose-optimization program which includes automated exposure control, adjustment of the mA and/or kV according to patient size and/or use of iterative reconstruction technique. COMPARISON:  Head CT dated 08/01/2021. FINDINGS: Brain: Mild age-related atrophy and chronic microvascular ischemic changes. There is no acute intracranial hemorrhage. No mass effect or midline shift. No extra-axial fluid collection. Vascular: No hyperdense vessel or unexpected calcification. Skull: Normal. Negative for fracture or focal  lesion. Sinuses/Orbits: Mild diffuse mucoperiosteal thickening of paranasal sinuses. No air-fluid level. Mastoid air cells are clear. Other: None IMPRESSION: 1. No acute intracranial pathology. 2. Mild age-related atrophy and chronic microvascular ischemic changes. Electronically Signed   By: Anner Crete M.D.   On: 02/23/2022 19:30   DG Chest Port 1 View  Result Date: 02/23/2022 CLINICAL DATA:  Possible sepsis EXAM: PORTABLE CHEST 1 VIEW COMPARISON:  02/15/2022, chest CT 03/28/2019 FINDINGS: Mild reticular opacity at the bases consistent with chronic lung disease. No acute airspace disease or effusion. Stable cardiomediastinal silhouette with aortic atherosclerosis. No pneumothorax IMPRESSION: No active disease.  Chronic lung disease at the bases Electronically Signed   By: Donavan Foil M.D.   On: 02/23/2022 17:22    Procedures Procedures    Medications Ordered in ED Medications  acetaminophen (TYLENOL) tablet 1,000 mg (has no administration in time range)  aspirin EC tablet 81 mg (81 mg Oral Given 02/23/22 2237)  carvedilol (COREG) tablet 25 mg (25 mg Oral Given 02/23/22 2237)  Doxepin HCl TABS 6 mg (has no administration in time range)  fluticasone (FLONASE) 50 MCG/ACT nasal spray 1 spray (has no administration in time range)  folic acid (FOLVITE) tablet 1 mg (has no administration in time range)  pantoprazole (PROTONIX) EC tablet 40 mg (40 mg Oral Given 02/23/22 2237)  rosuvastatin (CRESTOR) tablet 20 mg (has no administration in time range)  umeclidinium-vilanterol (ANORO ELLIPTA) 62.5-25 MCG/ACT 1 puff (has no administration in time range)  albuterol (PROVENTIL) (2.5 MG/3ML) 0.083% nebulizer solution 2.5 mg (has no administration in time range)  enoxaparin (LOVENOX) injection 40 mg (has no administration in time range)  0.9 %  sodium chloride infusion (has no administration in time range)  lactated ringers bolus 1,000 mL (0 mLs Intravenous Stopped 02/23/22 2219)    ED Course/  Medical Decision Making/ A&P Clinical Course as of 02/23/22 2244  Mon Feb 23, 2022  1737 Discussed with daughter, patient not walking, unable to care for self, not eating or drinking water. Hasn't been talking any more which is also worsening.  [WS]  2243 Signed out to family medicine inpatient team pending urinalysis. Labs otherwise  overall reassuring, no elevated WBC count, electrolytes reassuring. CT head on my interpretation negative, confirmed by radiology.  [WS]    Clinical Course User Index [WS] Cristie Hem, MD                           Medical Decision Making Amount and/or Complexity of Data Reviewed Labs: ordered. Radiology: ordered.  Risk Decision regarding hospitalization.   66 year old female presenting with altered mental status.  Discussing with the daughter, it seems that this is a slow progression of the patient's chronic dementia.  Given patient has not been eating or drinking in the past week, and initially hypotensive, will check labs including electrolytes to evaluate for toxic or metabolic derangements.  Lower concern for infection, chest x-ray clear, will check urinalysis as well.  No abdominal tenderness to suggest intra-abdominal process.  Patient afebrile in the emergency department.  Will closely monitor blood pressure.  Will also obtain CT head although low concern for intracranial injury without fall.        Final Clinical Impression(s) / ED Diagnoses Final diagnoses:  Dehydration  Severe dementia, unspecified dementia type, unspecified whether behavioral, psychotic, or mood disturbance or anxiety ([place])  Failure to thrive in adult  Total self-care deficit    Rx / DC Orders ED Discharge Orders     None         Cristie Hem, MD 02/23/22 2244

## 2022-02-23 NOTE — ED Notes (Signed)
Patient transported to CT 

## 2022-02-23 NOTE — Assessment & Plan Note (Addendum)
Likely chronic due to progression of dementia.  Lab work and imaging (MRI, CT, CXR) negative thus far for acute pathology of altered mental status. EEG w/ signs of mild diffuse encephalopathy. - F/u neuro outpatient - Follow-up thiamine, urine culture, blood cultures - Palliative care following, dispo SNF/hospice tomorrow - PT/OT following - Vitals per floor - Delirium precautions - Fall precautions

## 2022-02-23 NOTE — Assessment & Plan Note (Addendum)
Hypotensive on arrival with good response to 1L bolus. Per daughter patient not taking Diltiazem (uses PRN for BP) since early July. -- Continue Coreg 25mg  BID -- Monitor BP with vitals

## 2022-02-23 NOTE — Assessment & Plan Note (Addendum)
--  Continue Albuterol  --Continue Anoro Ellipta

## 2022-02-23 NOTE — Hospital Course (Addendum)
Jocelyn Shelton is a 66 y.o. female who presented with altered mental status. Patient was admitted to [facility] Medicine Teaching Service for work-up. Please see problem based hospital course below:  Altered Mental Status Patient presented unable to tolerate PO, and unable to speak. Patient was hypotensive with otherwise stable vitals in ED and responded well 1 litre bolus of lactated ringers. Metabolic panel was reassuring and CT head was negative. MRI brain showed***  Moderate dementia Patient presented nonverbal with new inability to walk and decreased oral intake. Started on IV fluids and soft diet. Palliative was consulted and recommended***  Hypertension Patient was hypotensive on arrival to ED. Pressure responded to 1 litre bolus of lactated ringers. Patient was continued on home Carvedilol. Patient was discharged on***  Sacral Pressure Ulcer, stage 1 Wound care was consulted on admission. They recommend***  Chronic Stable Conditions COPD - Patient was continued on Albuterol and Anoro Ellipta. Patient was discharged on*** Coronary Artery Disease - Patient was continued on Aspirin and Crestor. Patient was discharged on*** Major Depressive Disorder - Patient was continued on Doxepin. Patient was discharged on***    Discharge Recommendations: 1.

## 2022-02-23 NOTE — ED Triage Notes (Signed)
Pt via [facility] from home where she lives w daughter for eval of decreased PO, altered mental status/increased confusion, non-verbal at baseline. Concern for potential decub on buttock  102/62 HR 95

## 2022-02-23 NOTE — Assessment & Plan Note (Signed)
Unable assess status as patient is non-verbal -dc'd doxepin, replaced w melatonin

## 2022-02-23 NOTE — Assessment & Plan Note (Addendum)
See picture in media tab. - Wound care consult - Dressing changes ordered

## 2022-02-23 NOTE — Assessment & Plan Note (Signed)
Feel presentation most likely related to worsening dementia, but evaluating for other etiologies given acute decline (see above). Nonverbal and with new inability to walk and decreased oral intake. - IVF NS @75mL /hr  - Soft diet as able - Assist with feeds - Crush meds in applesauce for administration - Holding Namenda - Palliative consult for goals of care - TOC for SNF as family cannot care for her

## 2022-02-23 NOTE — ED Provider Triage Note (Signed)
Emergency Medicine Provider Triage Evaluation Note  Jocelyn Shelton , a 66 y.o. female  was evaluated in triage.  Patient presents via EMS for evaluation of altered mental status.  Patient has history of dementia.  Patient on arrival to triage is found to be hypotensive with systolic of 60s.  Does respond to verbal stimuli.  Otherwise does not provide much history.  Sepsis lab work ordered.  Liter bolus ordered in triage. Additional fluids and antibiotics deferred until patient is taken back to her room.    Review of Systems  Positive: As above Negative: As above  Physical Exam  BP (!) 68/43 (BP Location: Left Arm)   Pulse 63   Temp 98.3 F (36.8 C) (Oral)   Resp 14   SpO2 99%  Gen:   Awake, no distress   Resp:  Normal effort  MSK:   Moves extremities without difficulty  Other:   Medical Decision Making  Medically screening exam initiated at 4:50 PM.  Appropriate orders placed.  Jocelyn Shelton was informed that the remainder of the evaluation will be completed by another provider, this initial triage assessment does not replace that evaluation, and the importance of remaining in the ED until their evaluation is complete.     Jocelyn [place], PA-C 02/23/22 1652

## 2022-02-23 NOTE — Progress Notes (Signed)
Transition of Care [facility]) - Emergency Department Mini Assessment   Patient Details  Name: Jocelyn Shelton MRN: [id number] Date of Birth: [birth year]  Transition of Care [facility]) CM/SW Contact:    Lossie Faes Tarpley-Carter, LCSWA Phone Number: 02/23/2022, 7:26 PM   Clinical Narrative: [facility] CSW consulted with pts daughter, April Flessner [phone].  April is seeking placement for pt, due she being disabled and unable lift pt.  Pt has not been walking for 2 weeks, her PCP is aware.  Palliative Care has been contacted and appointment is on 03/02/2022.  CSW currently awaiting PT evaluation in am.  Mabry Tift Tarpley-Carter, MSW, LCSW-A Pronouns:  She/Her/Hers [facility] Clinical Social Worker Direct Number:  [phone] [email or web address]    ED Mini Assessment: What brought you to the Emergency Department? : Placement  Barriers to Discharge: No Barriers Identified     Means of departure: Not know  Interventions which prevented an admission or readmission: SNF Placement    Patient Contact and Communications Key Contact 1: April Blackley     Contact Date: 02/23/22,     Contact Phone Number: [phone] Call outcome: Daughter is seeking placement.  Patient states their goals for this hospitalization and ongoing recovery are:: N/A Patient is experiencing dementia.   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   Abnormality of heart beat 07/07/2019   Centrilobular emphysema (HCC) 07/29/2017   ILD (interstitial lung disease) (HCC) 07/29/2017    Pulmonary hypertension (HCC), Possible 06/17/2017   Coronary artery disease involving native coronary artery of native heart without angina pectoris    Scleroderma (HCC) 03/08/2016   Immunosuppressed status (HCC)    COPD (chronic obstructive pulmonary disease) (HCC) 08/30/2015   Rheumatoid arthritis (HCC) 03/14/2013   Hypokalemia 02/28/2013   GERD (gastroesophageal reflux disease) 08/02/2008   Major depressive disorder, recurrent episode (HCC) 09/23/2006   Tobacco use disorder 09/23/2006   Hypertension 09/23/2006   PCP:  Bess Kinds, MD Pharmacy:   [facility] [phone] - [place], [place] - [address] AT [facility] [address] [place] [address] Phone: [phone] Fax: [phone]

## 2022-02-24 ENCOUNTER — Inpatient Hospital Stay (HOSPITAL_COMMUNITY): Payer: Medicare Other

## 2022-02-24 DIAGNOSIS — I7300 Raynaud's syndrome without gangrene: Secondary | ICD-10-CM | POA: Diagnosis not present

## 2022-02-24 DIAGNOSIS — Z7189 Other specified counseling: Secondary | ICD-10-CM

## 2022-02-24 DIAGNOSIS — I2510 Atherosclerotic heart disease of native coronary artery without angina pectoris: Secondary | ICD-10-CM | POA: Diagnosis not present

## 2022-02-24 DIAGNOSIS — F339 Major depressive disorder, recurrent, unspecified: Secondary | ICD-10-CM | POA: Diagnosis not present

## 2022-02-24 DIAGNOSIS — L89151 Pressure ulcer of sacral region, stage 1: Secondary | ICD-10-CM | POA: Diagnosis not present

## 2022-02-24 DIAGNOSIS — J439 Emphysema, unspecified: Secondary | ICD-10-CM | POA: Diagnosis not present

## 2022-02-24 DIAGNOSIS — R6889 Other general symptoms and signs: Secondary | ICD-10-CM

## 2022-02-24 DIAGNOSIS — F03C3 Unspecified dementia, severe, with mood disturbance: Secondary | ICD-10-CM | POA: Diagnosis not present

## 2022-02-24 DIAGNOSIS — K219 Gastro-esophageal reflux disease without esophagitis: Secondary | ICD-10-CM | POA: Diagnosis not present

## 2022-02-24 DIAGNOSIS — Z66 Do not resuscitate: Secondary | ICD-10-CM | POA: Diagnosis not present

## 2022-02-24 DIAGNOSIS — E860 Dehydration: Secondary | ICD-10-CM | POA: Diagnosis present

## 2022-02-24 DIAGNOSIS — I5032 Chronic diastolic (congestive) heart failure: Secondary | ICD-10-CM | POA: Diagnosis not present

## 2022-02-24 DIAGNOSIS — R262 Difficulty in walking, not elsewhere classified: Secondary | ICD-10-CM | POA: Diagnosis not present

## 2022-02-24 DIAGNOSIS — R627 Adult failure to thrive: Secondary | ICD-10-CM

## 2022-02-24 DIAGNOSIS — Z8673 Personal history of transient ischemic attack (TIA), and cerebral infarction without residual deficits: Secondary | ICD-10-CM | POA: Diagnosis not present

## 2022-02-24 DIAGNOSIS — I252 Old myocardial infarction: Secondary | ICD-10-CM | POA: Diagnosis not present

## 2022-02-24 DIAGNOSIS — I959 Hypotension, unspecified: Secondary | ICD-10-CM | POA: Diagnosis not present

## 2022-02-24 DIAGNOSIS — Z801 Family history of malignant neoplasm of trachea, bronchus and lung: Secondary | ICD-10-CM | POA: Diagnosis not present

## 2022-02-24 DIAGNOSIS — R4182 Altered mental status, unspecified: Secondary | ICD-10-CM

## 2022-02-24 DIAGNOSIS — F03C0 Unspecified dementia, severe, without behavioral disturbance, psychotic disturbance, mood disturbance, and anxiety: Secondary | ICD-10-CM | POA: Diagnosis not present

## 2022-02-24 DIAGNOSIS — E7800 Pure hypercholesterolemia, unspecified: Secondary | ICD-10-CM | POA: Diagnosis not present

## 2022-02-24 DIAGNOSIS — I10 Essential (primary) hypertension: Secondary | ICD-10-CM

## 2022-02-24 DIAGNOSIS — M069 Rheumatoid arthritis, unspecified: Secondary | ICD-10-CM | POA: Diagnosis not present

## 2022-02-24 DIAGNOSIS — L89312 Pressure ulcer of right buttock, stage 2: Secondary | ICD-10-CM | POA: Diagnosis not present

## 2022-02-24 DIAGNOSIS — Z515 Encounter for palliative care: Secondary | ICD-10-CM | POA: Diagnosis not present

## 2022-02-24 DIAGNOSIS — M349 Systemic sclerosis, unspecified: Secondary | ICD-10-CM | POA: Diagnosis not present

## 2022-02-24 DIAGNOSIS — Z7401 Bed confinement status: Secondary | ICD-10-CM | POA: Diagnosis not present

## 2022-02-24 DIAGNOSIS — Z7982 Long term (current) use of aspirin: Secondary | ICD-10-CM | POA: Diagnosis not present

## 2022-02-24 DIAGNOSIS — I110 Hypertensive heart disease with heart failure: Secondary | ICD-10-CM | POA: Diagnosis not present

## 2022-02-24 LAB — COMPREHENSIVE METABOLIC PANEL
ALT: 33 U/L (ref 0–44)
AST: 45 U/L — ABNORMAL HIGH (ref 15–41)
Albumin: 3.1 g/dL — ABNORMAL LOW (ref 3.5–5.0)
Alkaline Phosphatase: 88 U/L (ref 38–126)
Anion gap: 9 (ref 5–15)
BUN: 12 mg/dL (ref 8–23)
CO2: 26 mmol/L (ref 22–32)
Calcium: 8.9 mg/dL (ref 8.9–10.3)
Chloride: 104 mmol/L (ref 98–111)
Creatinine, Ser: 0.76 mg/dL (ref 0.44–1.00)
GFR, Estimated: >60 mL/min (ref >60)
Glucose, Bld: 97 mg/dL (ref 70–99)
Potassium: 4.2 mmol/L (ref 3.5–5.1)
Sodium: 139 mmol/L (ref 135–145)
Total Bilirubin: 0.8 mg/dL (ref 0.3–1.2)
Total Protein: 7.1 g/dL (ref 6.5–8.1)

## 2022-02-24 LAB — ETHANOL: Alcohol, Ethyl (B): <10 mg/dL (ref <10)

## 2022-02-24 LAB — URINALYSIS, ROUTINE W REFLEX MICROSCOPIC
Bilirubin Urine: NEGATIVE
Glucose, UA: NEGATIVE mg/dL
Hgb urine dipstick: NEGATIVE
Ketones, ur: NEGATIVE mg/dL
Nitrite: NEGATIVE
Protein, ur: NEGATIVE mg/dL
Specific Gravity, Urine: 1.012 (ref 1.005–1.030)
pH: 6 (ref 5.0–8.0)

## 2022-02-24 LAB — RAPID URINE DRUG SCREEN, HOSP PERFORMED
Amphetamines: NOT DETECTED
Barbiturates: NOT DETECTED
Benzodiazepines: NOT DETECTED
Cocaine: NOT DETECTED
Opiates: NOT DETECTED
Tetrahydrocannabinol: NOT DETECTED

## 2022-02-24 LAB — CBC
HCT: 33.4 % — ABNORMAL LOW (ref 36.0–46.0)
Hemoglobin: 11 g/dL — ABNORMAL LOW (ref 12.0–15.0)
MCH: 28.7 pg (ref 26.0–34.0)
MCHC: 32.9 g/dL (ref 30.0–36.0)
MCV: 87.2 fL (ref 80.0–100.0)
Platelets: 144 10*3/uL — ABNORMAL LOW (ref 150–400)
RBC: 3.83 MIL/uL — ABNORMAL LOW (ref 3.87–5.11)
RDW: 12.8 % (ref 11.5–15.5)
WBC: 6.8 10*3/uL (ref 4.0–10.5)
nRBC: 0 % (ref 0.0–0.2)

## 2022-02-24 MED ORDER — ZINC OXIDE 40 % EX OINT
TOPICAL_OINTMENT | Freq: Two times a day (BID) | CUTANEOUS | Status: DC
  Filled 2022-02-24: qty 57

## 2022-02-24 MED ORDER — ENSURE ENLIVE PO LIQD
237.0000 mL | Freq: Three times a day (TID) | ORAL | Status: DC
  Administered 2022-02-24 – 2022-02-26 (×5): 237 mL via ORAL

## 2022-02-24 MED ORDER — MELATONIN 3 MG PO TABS
3.0000 mg | ORAL_TABLET | Freq: Every day | ORAL | Status: DC
  Administered 2022-02-24 – 2022-02-25 (×2): 3 mg via ORAL
  Filled 2022-02-24 (×2): qty 1

## 2022-02-24 NOTE — Progress Notes (Signed)
Pt unavailable for EEG. Pt going to MRI. Will attempt later when schedule permits

## 2022-02-24 NOTE — [place] FL2 (Signed)
[place] MEDICAID FL2 LEVEL OF CARE SCREENING TOOL     IDENTIFICATION  Patient Name: Jocelyn Shelton Birthdate: 31-May-1956 Sex: female Admission Date (Current Location): 02/23/2022  [place] and [place] Number:  Haynes Bast [id number] [phone] Facility and Address:  [facility], [address] [place], [place] [address]      Provider Number: [id number]  Attending Physician Name and Address:  Doreene Eland, MD  Relative Name and Phone Number:  April Binning - daughter - [phone]    Current Level of Care: Hospital Recommended Level of Care: Skilled Nursing Facility Prior Approval Number:    Date Approved/Denied:   PASRR Number: [id number]  Discharge Plan: SNF    Current Diagnoses: Patient Active Problem List   Diagnosis Date Noted   Pressure ulcer of right buttock, stage 2 (HCC) 02/23/2022   Chronic cough 11/13/2021   Insomnia 11/13/2021   CKD (chronic kidney disease), stage II    AMS (altered mental status) 08/02/2021   Moderate dementia (HCC)    Alteration of awareness 07/31/2021   Memory difficulty 07/30/2021   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   Abnormality of heart beat 07/07/2019   Centrilobular emphysema (HCC) 07/29/2017   ILD (interstitial lung disease) (HCC) 07/29/2017   Pulmonary hypertension (HCC), Possible 06/17/2017   Coronary artery disease involving native coronary artery of native heart without angina pectoris    Scleroderma (HCC) 03/08/2016   Immunosuppressed status (HCC)    COPD (chronic obstructive pulmonary disease) (HCC) 08/30/2015   Rheumatoid arthritis (HCC) 03/14/2013   Hypokalemia 02/28/2013   GERD (gastroesophageal reflux disease) 08/02/2008   Major depressive disorder, recurrent episode (HCC) 09/23/2006   Tobacco use disorder 09/23/2006   Hypertension 09/23/2006    Orientation RESPIRATION BLADDER Height & Weight     Self, Place  Normal Incontinent Weight:   Height:     BEHAVIORAL SYMPTOMS/MOOD  NEUROLOGICAL BOWEL NUTRITION STATUS      Incontinent Diet (DYS 3 Diet)  AMBULATORY STATUS COMMUNICATION OF NEEDS Skin   Extensive Assist Verbally PU Stage and Appropriate Care (sacral foam dressing for stage 2 sacral wound)   PU Stage 2 Dressing:  (Sacral Foam dressing application every 3 days, Desitin cream)                   Personal Care Assistance Level of Assistance  Bathing, Feeding, Dressing Bathing Assistance: Maximum assistance Feeding assistance: Limited assistance Dressing Assistance: Limited assistance     Functional Limitations Info  Sight, Hearing, Speech Sight Info: Impaired (wears glasses) Hearing Info: Adequate Speech Info: Adequate    SPECIAL CARE FACTORS FREQUENCY  PT (By licensed PT), OT (By licensed OT)     PT Frequency: 3-5 times per week OT Frequency: 3-5 times per week            Contractures Contractures Info: Not present    Additional Factors Info  Code Status, Allergies Code Status Info: DNR Allergies Info: Cymbalta, Sulfasalazine, Lisinopril, Olanzapine, tomato, Ticagrelor           Current Medications (02/24/2022):  This is the current hospital active medication list Current Facility-Administered Medications  Medication Dose Route Frequency Provider Last Rate Last Admin   acetaminophen (TYLENOL) tablet 1,000 mg  1,000 mg Oral Q6H PRN Celine Mans, MD       albuterol (PROVENTIL) (2.5 MG/3ML) 0.083% nebulizer solution 2.5 mg  2.5 mg Nebulization Q6H PRN Nestor Ramp, MD       aspirin EC tablet 81 mg  81 mg Oral Daily  Celine Mans, MD   81 mg at 02/24/22 1207   carvedilol (COREG) tablet 25 mg  25 mg Oral BID WC Celine Mans, MD   25 mg at 02/24/22 1206   enoxaparin (LOVENOX) injection 40 mg  40 mg Subcutaneous Q24H Lilland, Alana, DO   40 mg at 02/24/22 1207   feeding supplement (ENSURE ENLIVE / ENSURE PLUS) liquid 237 mL  237 mL Oral TID BM Vonna Drafts, MD       fluticasone (FLONASE) 50 MCG/ACT nasal spray 1 spray  1 spray  Each Nare Daily Celine Mans, MD   1 spray at 02/24/22 1225   folic acid (FOLVITE) tablet 1 mg  1 mg Oral Daily Celine Mans, MD   1 mg at 02/24/22 1206   liver oil-zinc oxide (DESITIN) 40 % ointment   Topical BID Nestor Ramp, MD       melatonin tablet 3 mg  3 mg Oral QHS Vonna Drafts, MD       pantoprazole (PROTONIX) EC tablet 40 mg  40 mg Oral Daily Celine Mans, MD   40 mg at 02/24/22 1206   rosuvastatin (CRESTOR) tablet 20 mg  20 mg Oral Daily Celine Mans, MD   20 mg at 02/24/22 1206   umeclidinium-vilanterol (ANORO ELLIPTA) 62.5-25 MCG/ACT 1 puff  1 puff Inhalation Daily Celine Mans, MD   1 puff at 02/24/22 0909     Discharge Medications: Please see discharge summary for a list of discharge medications.  Relevant Imaging Results:  Relevant Lab Results:   Additional Information SS# [id number]  Janae Bridgeman, RN

## 2022-02-24 NOTE — Consult Note (Addendum)
Palliative Care Consult Note                                  Date: 02/24/2022   Patient Name: Jocelyn Shelton  DOB: May 16, 1956  MRN: [id number]  Age / Sex: 66 y.o., female  PCP: Bess Kinds, MD Referring Physician: Nestor Ramp, MD  Reason for Consultation: Establishing goals of care  HPI/Patient Profile: 66 y.o. female  with past medical history of dementia, COPD/emphysema, rheumatoid arthritis, HFpEF, coronary artery disease, GERD, depression, HTN, and HLD who presented to [facility] ED on 02/23/2022 with altered mental status.  In the ED, patient was hypotensive to 68/43 but responded to IV fluids.  Labs significant for albumin 3.1 and hemoglobin 10.7.  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  MRI lumbar spine with no evidence of acute fracture but did show multilevel degenerative disc disease. Patient admitted to FMTS.  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  She is frail and chronically ill-appearing.  She is in no acute distress.  She is nonverbal on my assessment.  I spoke with daughter April by phone to discuss diagnosis, prognosis, GOC, EOL wishes, disposition, and options.  Patient is  known to Palliative Medicine from previous hospitalization in January 2023.  She has also been followed by [facility] outpatient palliative.  I re-introduced Palliative Medicine as specialized medical care for people living with serious illness. It focuses on providing relief from the symptoms and stress of a serious illness.   We discussed a brief life review of the patient. [name] is divorced.  April is her only child.  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  Patient lives in an apartment in [place].  April lives with her and is her primary caregiver.  There has continued to be progressive decline in her functional status.  We discussed patient's current illness and what it means in the larger context of his/her ongoing co-morbidities. Current clinical status was reviewed. We reviewed that dementia is a progressive, non-curable disease underlying the patient's current acute medical conditions.  Provided education on the natural trajectory of dementia, which results can decreased ability to communicate, ambulate, swallow, and maintain continence.  We reviewed specific indicators that patient had reached end stages of dementia, including inability to communicate, bed bound/non-ambulatory status, decreased oral intake, and incontinence of bowel/bladder.  April reports that it has become increasingly difficult to care for her mother as her condition has declined and expresses concern regarding her ability to continue caring for her at home.  She is strongly considering placement in a long-term care facility.  Created space and opportunity for patient and family to explore thoughts and feelings regarding current medical situation. Values and goals of care important to patient and family were attempted to be elicited.  The difference between full scope medical intervention and comfort care was considered.  April states that she wants her mother to "be comfortable" for her  remaining time. I provided education and counseling that focus of hospice is comfort and quality of life in the setting of end-stage illness.  I recommended the addition of hospice support at the facility when placement is found. I explained that hospice would be extra care for patient, communication with family, and assistance with symptom management and care when she further declines. April verbalized understanding and agrees with referral to hospice.  Questions and concerns addressed. Family encouraged to call with questions or concerns.      Review of Systems  Unable to perform ROS   Objective:   Primary Diagnoses: Present on Admission:  AMS (altered mental status)  COPD (chronic obstructive pulmonary disease) (HCC)  Hypertension  Coronary artery disease involving native coronary artery of native heart without angina pectoris  Major depressive disorder, recurrent episode (HCC)  Moderate dementia (HCC)   Physical Exam Vitals reviewed.  Constitutional:      General: She is not in acute distress.    Comments: Frail and chronically ill-appearing  Pulmonary:     Effort: Pulmonary effort is normal.  Neurological:     Mental Status: She is alert.     Motor: Weakness present.     Comments: Non-verbal     Vital Signs:  BP (!) 136/102   Pulse 75   Temp 97.6 F (36.4 C) (Oral)   Resp 16   SpO2 97%   Palliative Assessment/Data: PPS 20-30%     Assessment & Plan:   SUMMARY OF RECOMMENDATIONS   DNR/DNI as previously documented Continue current plan of care Daughter can no longer care for patient at home [facility] consult for hospice referral at SNF PMT will continue to follow  Primary Decision Maker: NEXT OF KIN daughter April  Prognosis:  < 6 months  Discharge Planning:  To Be Determined    Thank you for allowing [place] to participate in the care of Jocelyn Shelton   Time Total: MDM - High  Greater than 50%  of this time was spent counseling and coordinating care  related to the above assessment and plan.  Signed by: Sherlean Foot, NP Palliative Medicine Team  Team Phone # [phone]  For individual providers, please see AMION

## 2022-02-24 NOTE — Procedures (Signed)
Patient Name: Jocelyn Shelton  MRN: [id number]  Epilepsy Attending: Charlsie Quest  Referring Physician/Provider: Alicia Amel, MD  Date: 02/24/2022 Duration: 26.46 mins  Patient history: 66 year old female with altered mental status.  EEG to evaluate for seizure.  Level of alertness: Awake  AEDs during EEG study: None  Technical aspects: This EEG study was done with scalp electrodes positioned according to the 10-20 International system of electrode placement. Electrical activity was reviewed with band pass filter of 1-70Hz , sensitivity of 7 uV/mm, display speed of 29mm/sec with a 60Hz  notched filter applied as appropriate. EEG data were recorded continuously and digitally stored.  Video monitoring was available and reviewed as appropriate.  Description: The posterior dominant rhythm consists of 8 Hz activity of moderate voltage (25-35 uV) seen predominantly in posterior head regions, symmetric and reactive to eye opening and eye closing.  EEG showed intermittent generalized 3-6hz  theta-delta slowing. Hyperventilation and photic stimulation were not performed.     ABNORMALITY - Intermittent slow, generalized  IMPRESSION: This study is suggestive of mild diffuse encephalopathy, non specific etiology. No seizures or epileptiform discharges were seen throughout the recording.   Husam Hohn 

## 2022-02-24 NOTE — Consult Note (Signed)
WOC Nurse Consult Note: Reason for Consult:stage 2 pressure injury to right buttock (partial thickness). Additional moisture associated skin damage, specifically irritant contact dermatitis to buttocks related to urinary incontinence noted.  ICD-10 CM Codes for Irritant Dermatitis L24A2 - Due to fecal, urinary or dual incontinence  Wound type:pressure plus moisture Pressure Injury POA: Yes Measurement:To be obtained today prior to first application of Desitin Moisture Barrier Cream Wound YTK:ZSWF, moist Drainage (amount, consistency, odor) scant serous Periwound:intact, macerated Dressing procedure/placement/frequency:Turning and repositioning is in place. Additional guidance has been provided to minimize time in the supine position. Topical care with Desitin cream as a moisture barrier will both protect from the assault of urinary incontinence and provide a moist wound healing environment for the reepithelialization of this partial thickness area of skin loss. Additional PI prevention measures (sacral prophylactic foam dressing and floatation of heels) are to be placed today. Patient is using our house DermaTherapy bed linen system, a moisture wicking, antimicrobial and low friction coefficient (CoF) system that will additional support tissue integrity. No plastic backed adult briefs or disposable underpads are recommended.  WOC nursing team will not follow, but will remain available to this patient, the nursing and medical teams.  Please re-consult if needed.  Thank you for inviting [place] to participate in this patient's Plan of Care.  Ladona Mow, MSN, RN, CNS, GNP, Leda Min, [employer], [employer] phone:  [phone]

## 2022-02-24 NOTE — Progress Notes (Addendum)
Daily Progress Note Intern Pager: [phone]  Patient name: Jocelyn Shelton Medical record number: [id number] Date of birth: 01-16-1956 Age: 66 y.o. Gender: female  Primary Care Provider: Bess Kinds, MD Consultants: Palliative Code Status: DNR  Pt Overview and Major Events to Date:  7/31 - admitted  Assessment and Plan:  Jocelyn Shelton is a 66yo female admitted for altered mental status thought to be 2/2 progressing dementia vs seizure. Less likely catatonia given lack of rigidity on exam. MRI and EEG today. Has been tolerating diet. Pertinent PMH/PSH includes Dementia, COPD, HTN, CAD s/p cath 2016, RA/Scleroderma/Raynaud's.   * AMS (altered mental status) Likely chronic due to progression of dementia, but cannot exclude neurologic pathology yet given acute decline.  Lab work and imaging (CT, CXR) negative thus far for acute pathology of altered mental status. - Follow-up MRI brain, neurology to see -- EEG today -- DC'd doxepin, ordered melatonin for sleep -- Ethanol level urine tox - Follow-up thiamine, B12, UA, urine culture, blood cultures - Palliative care consult - PT/OT eval and treat - Vitals per floor - Cardiac monitoring x12 hours - Delirium precautions - Fall precautions - SLP evaluation   Moderate dementia (HCC) Feel presentation most likely related to worsening dementia, but evaluating for other etiologies given acute decline (see above). Nonverbal and with new inability to walk and decreased oral intake. Tolerating oral feeds and Cr has improved, will dc fluids and reevaluate. - Off IVF today, will recheck BMP in AM - Soft diet as able, add Ensure supplement - Assist with feeds - Crush meds in applesauce for administration - Holding Namenda - Palliative consult for goals of care - TOC for SNF as family cannot care for her  Pressure ulcer of right buttock, stage 2 (HCC) See picture in media tab. - Wound care consult - Dressing changes  ordered  Coronary artery disease involving native coronary artery of native heart without angina pectoris EKG today unchanged from prior. Last ECHO from 06/17/17 grade 2 diastolic dysfunction, EF 55-60%. --Continue Aspirin 81mg  daily --Continue Crestor 20mg  daily  COPD (chronic obstructive pulmonary disease) (HCC) --Continue Albuterol  --Continue Anoro Ellipta  Hypertension Hypotensive on arrival with good response to 1L bolus. Per daughter patient not taking Diltiazem (uses PRN for BP) since early July. -- Continue Coreg 25mg  BID -- Monitor BP with vitals  Major depressive disorder, recurrent episode (HCC) Unable assess status as patient is non-verbal -Continue Doxepin 6mg  QHS      FEN/GI: mechanical soft diet, NS IVF at 27mL/hr PPx: Lovenox Dispo:Pending PT recommendations  pending clinical improvement . Barriers include placement.   Subjective:  Continues to be nonverbal this morning. However, makes eye contact, shakes hand, and wiggles toes on command.  Objective: Temp:  [97.6 F (36.4 C)-98.3 F (36.8 C)] 97.6 F (36.4 C) (08/01 0756) Pulse Rate:  [63-88] 75 (08/01 0800) Resp:  [14-24] 16 (08/01 0239) BP: (68-171)/(43-117) 136/102 (08/01 0756) SpO2:  [95 %-100 %] 97 % (08/01 0900)  BP 144-170/78-117  Physical Exam: General: Alert, nonverbal, tracks with eyes, occasionally follows simple commands Cardiovascular: RRR, no m/r/g Respiratory: CTAB, normal WOB Abdomen: soft, NT/ND Extremities: Moves all extremities spontaneously, no rigidity noted in b/l UE Neuro: Moves extremities, tracks with eyes, wiggles toes on command  Laboratory: Most recent CBC Lab Results  Component Value Date   WBC 6.8 02/24/2022   HGB 11.0 (L) 02/24/2022   HCT 33.4 (L) 02/24/2022   MCV 87.2 02/24/2022   PLT 144 (L) 02/24/2022  Most recent BMP    Latest Ref Rng & Units 02/24/2022    3:02 AM  BMP  Glucose 70 - 99 mg/dL 97   BUN 8 - 23 mg/dL 12   Creatinine 9.16 - 1.00 mg/dL  9.45   Sodium 038 - 882 mmol/L 139   Potassium 3.5 - 5.1 mmol/L 4.2   Chloride 98 - 111 mmol/L 104   CO2 22 - 32 mmol/L 26   Calcium 8.9 - 10.3 mg/dL 8.9     Other pertinent labs  -LA wnl -PT/INR 15.6/1.3 -Ammonia wnl -B12 996, folate wnl -TSH wnl  Pending: Blood culture, Vitamin B1, UA  Imaging/Diagnostic Tests:  CT Head wo Contrast (7/31): Radiologist impression:  IMPRESSION: 1. No acute intracranial pathology. 2. Mild age-related atrophy and chronic microvascular ischemic changes.    PGY-1, [facility] Family Medicine FPTS Intern pager: [phone], text pages welcome Secure chat group [facility] Teaching Service

## 2022-02-24 NOTE — Evaluation (Signed)
Clinical/Bedside Swallow Evaluation Patient Details  Name: Jocelyn Shelton MRN: [id number] Date of Birth: [birth year]  Today's Date: 02/24/2022 Time: SLP Start Time (ACUTE ONLY): 0726 SLP Stop Time (ACUTE ONLY): 0746 SLP Time Calculation (min) (ACUTE ONLY): 20 min  Past Medical History:  Past Medical History:  Diagnosis Date   Avascular necrosis of humeral head, left (HCC) 06/17/2017   Avascular necrosis of humeral head, right (HCC) 06/17/2017   Bronchiectasis (HCC) 06/17/2017   CAD in native artery 05/23/2015   High cholesterol    Hypertension    Leg pain    ABIs 1/18: R 1.2, L 1.1   Myocardial infarction (HCC) 04/2015   Raynauds syndrome 09/23/2006   Qualifier: Diagnosis of  By: Moreno-Coll  MD, Adlih     Reflux    Rheumatoid arthritis (HCC)    "all over" (06/16/2017)   Scleroderma (HCC)    Past Surgical History:  Past Surgical History:  Procedure Laterality Date   APPENDECTOMY  90's   CARDIAC CATHETERIZATION N/A 05/22/2015   Procedure: Left Heart Cath and Coronary Angiography;  Surgeon: Lennette Bihari, MD;  Location: [facility] INVASIVE CV [facility];  Service: Cardiovascular;  Laterality: N/A;   CARDIAC CATHETERIZATION N/A 05/22/2015   Procedure: Coronary Stent Intervention;  Surgeon: Lennette Bihari, MD;  Location: [facility] INVASIVE CV LAB;  Service: Cardiovascular;  Laterality: N/A;   HPI:  66 yo female adm to [facility] with poor po intake, weakness.  Pt brain imaging negative - atrophy.  CXR negative except chronic lung disease.  PMH + for dementia, CVA, seizures.  Swallow eval ordered.    Assessment / Plan / Recommendation  Clinical Impression  Patient clinically judged to have functional oropharyngeal swallow ability.  No focal CN deficits- pt able to seal lips on straw/spoon - but she did not follow directions nor did she phonate.  No indications of airway compromise or dysphagia across all consistencies tested *graham crackers x4, applesauce x3 ounces, apple juice 4 ounces and water x3 ounces.    Pt self fed without manual difficulties.  Due to her edentulous status, recommend continue dys3/thin diet and medications taken as tolerated.  No family present and pt's cognitive deficits prevent her from demonstrating comprehension.   Of note, ? If pt had contusion on anterior neck region - did not demonstrate discomfort - SLP did not palpate this area.   SLP Visit Diagnosis: Dysphagia, unspecified (R13.10)    Aspiration Risk  Mild aspiration risk    Diet Recommendation Dysphagia 3 (Mech soft);Thin liquid   Liquid Administration via: Cup;Straw Medication Administration: Other (Comment) (as tolerated) Supervision: Intermittent supervision to cue for compensatory strategies Compensations: Small sips/bites;Slow rate Postural Changes: Seated upright at 90 degrees;Remain upright for at least 30 minutes after po intake    Other  Recommendations Oral Care Recommendations: Oral care BID    Recommendations for follow up therapy are one component of a multi-disciplinary discharge planning process, led by the attending physician.  Recommendations may be updated based on patient status, additional functional criteria and insurance authorization.  Follow up Recommendations No SLP follow up      Assistance Recommended at Discharge Frequent or constant Supervision/Assistance (due to dementia)  Functional Status Assessment Patient has not had a recent decline in their functional status  Frequency and Duration            Prognosis Barriers to Reach Goals: Cognitive deficits      Swallow Study   General Date of Onset: 02/24/22 HPI: 66 yo female adm to  [facility] with poor po intake, weakness.  Pt brain imaging negative - atrophy.  CXR negative except chronic lung disease.  PMH + for dementia, CVA, seizures.  Swallow eval ordered. Type of Study: Bedside Swallow Evaluation Previous Swallow Assessment: none in the system Diet Prior to this Study: Dysphagia 3 (soft);Thin liquids Temperature Spikes  Noted: No Respiratory Status: Room air History of Recent Intubation: No Behavior/Cognition: Alert;Cooperative;Doesn't follow directions;Other (Comment) (does not follow directions but is able to self feed without assist) Oral Cavity Assessment: Dry Oral Care Completed by SLP: Other (Comment) Oral Cavity - Dentition: Edentulous Vision: Functional for self-feeding Self-Feeding Abilities: Able to feed self Patient Positioning: Upright in bed Baseline Vocal Quality: Other (comment) (pt did not phonate) Volitional Cough: Cognitively unable to elicit Volitional Swallow: Unable to elicit    Oral/Motor/Sensory Function Overall Oral Motor/Sensory Function: Other (comment) (pt did not follow directions for OME, able to feed herself with adequate labial seal and no oral retention observed)   Ice Chips Ice chips: Not tested   Thin Liquid Thin Liquid: Within functional limits Presentation: Self Fed;Straw    Nectar Thick Nectar Thick Liquid: Not tested   Honey Thick Honey Thick Liquid: Not tested   Puree Puree: Within functional limits Presentation: Self Fed;Spoon   Solid     Solid: Within functional limits Presentation: Self Fed Other Comments: Clinically judged minimal delay in swallow - suspect oral due to edentulous status and dementia      Chales Abrahams 02/24/2022,8:04 AM   Rolena Infante, MS [facility] SLP Acute Rehab Services Office [phone] Pager [phone]

## 2022-02-24 NOTE — TOC Initial Note (Signed)
Transition of Care [facility]) - Initial/Assessment Note    Patient Details  Name: Jocelyn Shelton MRN: [id number] Date of Birth: 07-01-1956  [facility]) CM/SW Contact:    Janae Bridgeman, RN Phone Number: 02/24/2022, 2:13 PM  Clinical Narrative:                 CM called and spoke with the patient's daughter on the phone to discuss SNF placement.  The patient's daughter states that the patient has become difficult to care for at home due to her dementia, declining memory and increased care needs at the home due to her mobility.  The patient's daughter states that the patient has been staying with her in a upper story apartment and she is difficulty care for her needs at the home.    FL2 completed and the patient was faxed out in the hub for bed offers.  Once bed offers are available in the hub - I will call the patient's daughter and offer Medicare choice regarding SNF placement.  The patient was last admitted to [facility] October 24, 2021.  The patient has Medicare A&B and available Medicaid as well.  CM will continue to follow the patient for SNF placement.  Expected Discharge Plan: Skilled Nursing Facility Barriers to Discharge: Continued Medical Work up   Patient Goals and CMS Choice Patient states their goals for this hospitalization and ongoing recovery are:: Patient unable to states goals for care.  The patient's daughter requests SNF placement. CMS Medicare.gov Compare Post Acute Care list provided to:: Patient Represenative (must comment) (Daughter - April Wahab [phone]) Choice offered to / list presented to : Adult Children  Expected Discharge Plan and Services Expected Discharge Plan: Skilled Nursing Facility   Discharge Planning Services: CM Consult Post Acute Care Choice: Skilled Nursing Facility Living arrangements for the past 2 months: Apartment                                      Prior Living Arrangements/Services Living  arrangements for the past 2 months: Apartment Lives with:: Adult Children Patient language and need for interpreter reviewed:: Yes Do you feel safe going back to the place where you live?: No   The patient's daughter is unable to care for her at home at this time.  Need for Family Participation in Patient Care: Yes (Comment) Care giver support system in place?: Yes (comment) Current home services: DME (Has Cane- single point, 3:1 at home) Criminal Activity/Legal Involvement Pertinent to Current Situation/Hospitalization: No - Comment as needed  Activities of Daily Living      Permission Sought/Granted Permission sought to share information with : Case Manager, Family Supports, Magazine features editor Permission granted to share information with : Yes, Verbal Permission Granted     Permission granted to share info w AGENCY: SNF facilities  Permission granted to share info w Relationship: April Lacorte, daughter - [phone]     Emotional Assessment Appearance:: Appears stated age Attitude/Demeanor/Rapport: Unable to Assess   Orientation: : Oriented to Self, Oriented to Place Alcohol / Substance Use: Not Applicable Psych Involvement: No (comment)  Admission diagnosis:  Dehydration [E86.0] Failure to thrive in adult [R62.7] Total self-care deficit [R68.89] AMS (altered mental status) [R41.82] Severe dementia, unspecified dementia type, unspecified whether behavioral, psychotic, or mood disturbance or anxiety (HCC) [F03.C0] Patient Active Problem List   Diagnosis Date Noted   Pressure ulcer of  right buttock, stage 2 (HCC) 02/23/2022   Chronic cough 11/13/2021   Insomnia 11/13/2021   CKD (chronic kidney disease), stage II    AMS (altered mental status) 08/02/2021   Moderate dementia (HCC)    Alteration of awareness 07/31/2021   Memory difficulty 07/30/2021   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   Abnormality of heart beat 07/07/2019   Centrilobular emphysema  (HCC) 07/29/2017   ILD (interstitial lung disease) (HCC) 07/29/2017   Pulmonary hypertension (HCC), Possible 06/17/2017   Coronary artery disease involving native coronary artery of native heart without angina pectoris    Scleroderma (HCC) 03/08/2016   Immunosuppressed status (HCC)    COPD (chronic obstructive pulmonary disease) (HCC) 08/30/2015   Rheumatoid arthritis (HCC) 03/14/2013   Hypokalemia 02/28/2013   GERD (gastroesophageal reflux disease) 08/02/2008   Major depressive disorder, recurrent episode (HCC) 09/23/2006   Tobacco use disorder 09/23/2006   Hypertension 09/23/2006   PCP:  Bess Kinds, MD Pharmacy:   [facility] [phone] - [place], [place] - [address] AT [facility] OF [address] [place] [address] Phone: [phone] Fax: [phone]     Social Determinants of Health (SDOH) Interventions    Readmission Risk Interventions    02/24/2022    1:59 PM  Readmission Risk Prevention Plan  Transportation Screening Complete  PCP or Specialist Appt within 5-7 Days Complete  Home Care Screening Complete  Medication Review (RN CM) Complete

## 2022-02-24 NOTE — Progress Notes (Signed)
EEG complete - results pending 

## 2022-02-24 NOTE — Evaluation (Signed)
Occupational Therapy Evaluation Patient Details Name: Jocelyn Shelton MRN: [id number] DOB: [birth year] Today's Date: 02/24/2022   History of Present Illness Pt is a 66 y/o F presenting to ED on 7/31 with AMS, decreased PO intake, and stage II on pressure ulcer on buttocks. Noted 07/2021 admission for confusion secondary to possible NPH. PMH includes emphyssema, HTN, HLD, CAD, GERD, dementia, avascular necrosis of bil humeral heads, MI , Raynaud's syndrome, scleroderma, and RA.   Clinical Impression   Pt questionable historian, only responding "yes" to certain questions throughout session, following commands appropriately. Lives with daughter who provides assist for ADLs, pt/chart review reporting pt uses cane at baseline for mobility. Pt currently needing min-mod A for ADLs, min A for bed mobility, and min-modA +2 for transfers with handheld assist. Pt presenting with impairments listed below, will follow acutely. Recommend SNF at /dc.      Recommendations for follow up therapy are one component of a multi-disciplinary discharge planning process, led by the attending physician.  Recommendations may be updated based on patient status, additional functional criteria and insurance authorization.   Follow Up Recommendations  Skilled nursing-short term rehab (<3 hours/day)    Assistance Recommended at Discharge Frequent or constant Supervision/Assistance  Patient can return home with the following Help with stairs or ramp for entrance;Assist for transportation;Direct supervision/assist for financial management;Direct supervision/assist for medications management;Assistance with feeding;Assistance with cooking/housework;A lot of help with bathing/dressing/bathroom;A lot of help with walking and/or transfers    Functional Status Assessment  Patient has had a recent decline in their functional status and demonstrates the ability to make significant improvements in function in a reasonable and predictable  amount of time.  Equipment Recommendations  None recommended by OT (defer)    Recommendations for Other Services PT consult     Precautions / Restrictions Precautions Precautions: Fall Restrictions Weight Bearing Restrictions: No      Mobility Bed Mobility Overal bed mobility: Needs Assistance Bed Mobility: Supine to Sit, Sit to Supine     Supine to sit: Min assist Sit to supine: Min assist   General bed mobility comments: incr time    Transfers Overall transfer level: Needs assistance Equipment used: 2 person hand held assist Transfers: Sit to/from Stand, Bed to chair/wheelchair/BSC Sit to Stand: Min assist, Mod assist, +2 physical assistance     Step pivot transfers: Min assist, Mod assist, +2 physical assistance     General transfer comment: pt with posterior lean      Balance Overall balance assessment: Needs assistance Sitting-balance support: Feet supported Sitting balance-Leahy Scale: Good Sitting balance - Comments: can reach outside BOS without LOB   Standing balance support: During functional activity Standing balance-Leahy Scale: Poor Standing balance comment: reliant on external support                           ADL either performed or assessed with clinical judgement   ADL Overall ADL's : Needs assistance/impaired Eating/Feeding: Set up;Sitting   Grooming: Minimal assistance;Sitting   Upper Body Bathing: Moderate assistance;Sitting   Lower Body Bathing: Moderate assistance;Sitting/lateral leans   Upper Body Dressing : Moderate assistance;Sitting   Lower Body Dressing: Moderate assistance;Sitting/lateral leans;Sit to/from stand   Toilet Transfer: Minimal assistance;+2 for physical assistance;Stand-pivot;BSC/3in1   Toileting- Clothing Manipulation and Hygiene: Moderate assistance       Functional mobility during ADLs: Minimal assistance;Moderate assistance;+2 for physical assistance       Vision Baseline Vision/History:  1 Wears glasses Additional Comments:  will further assess     Perception     Praxis      Pertinent Vitals/Pain Pain Assessment Pain Assessment: Faces Pain Score: 0-No pain Pain Intervention(s): Monitored during session     Hand Dominance Right   Extremity/Trunk Assessment Upper Extremity Assessment Upper Extremity Assessment: Generalized weakness   Lower Extremity Assessment Lower Extremity Assessment: Defer to PT evaluation   Cervical / Trunk Assessment Cervical / Trunk Assessment: Normal   Communication Communication Communication: Other (comment) (only answering "yes" during session)   Cognition Arousal/Alertness: Awake/alert Behavior During Therapy: WFL for tasks assessed/performed, Flat affect, Restless Overall Cognitive Status: Difficult to assess                                 General Comments: pt following commands appropriately during session, only responding "Yes" to certain questions asked during session; hx of dementia at baseline     General Comments  VSS on RA    Exercises     Shoulder Instructions      Home Living Family/patient expects to be discharged to:: Private residence Living Arrangements: Children Available Help at Discharge: Family Type of Home: Apartment Home Access: Stairs to enter Secretary/administrator of Steps: 14 Entrance Stairs-Rails: Left;Right Home Layout: One level     Bathroom Shower/Tub: Chief Strategy Officer: Standard     Home Equipment: BSC/3in1;Cane - single point;Shower seat;Hand held shower head   Additional Comments: info taken from chart review/previous admission      Prior Functioning/Environment Prior Level of Function : History of Falls (last six months);Needs assist             Mobility Comments: Pt uses her cane and performs transfers and ambulates in home without assistance. She needs assistance for bed mobility and stairs. Hx of falls ADLs Comments: states "yes" when  asked if daughter assists with ADLs at home        OT Problem List: Decreased strength;Decreased range of motion;Decreased activity tolerance;Impaired balance (sitting and/or standing);Decreased cognition;Decreased safety awareness      OT Treatment/Interventions: Self-care/ADL training;Therapeutic exercise;DME and/or AE instruction;Energy conservation;Therapeutic activities;Cognitive remediation/compensation;Visual/perceptual remediation/compensation;Patient/family education;Balance training    OT Goals(Current goals can be found in the care plan section) Acute Rehab OT Goals Patient Stated Goal: pt unable to state OT Goal Formulation: Patient unable to participate in goal setting Time For Goal Achievement: 03/10/22 Potential to Achieve Goals: Fair ADL Goals Pt Will Perform Upper Body Dressing: with supervision;sitting;standing Pt Will Perform Lower Body Dressing: with supervision;sit to/from stand;sitting/lateral leans Pt Will Transfer to Toilet: with supervision;bedside commode;stand pivot transfer Additional ADL Goal #1: pt willl perform bed mobility with supervision in prep for ADLs  OT Frequency: Min 2X/week    Co-evaluation PT/OT/SLP Co-Evaluation/Treatment: Yes Reason for Co-Treatment: Complexity of the patient's impairments (multi-system involvement);For patient/therapist safety;To address functional/ADL transfers   OT goals addressed during session: ADL's and self-care      AM-PAC OT "6 Clicks" Daily Activity     Outcome Measure Help from another person eating meals?: A Little Help from another person taking care of personal grooming?: A Little Help from another person toileting, which includes using toliet, bedpan, or urinal?: A Lot Help from another person bathing (including washing, rinsing, drying)?: A Lot Help from another person to put on and taking off regular upper body clothing?: A Lot Help from another person to put on and taking off regular lower body  clothing?: A Lot  6 Click Score: 14   End of Session Equipment Utilized During Treatment: Gait belt Nurse Communication: Mobility status  Activity Tolerance: Patient tolerated treatment well Patient left: in bed;with call bell/phone within reach;with bed alarm set (pt going to MRI soon)  OT Visit Diagnosis: Unsteadiness on feet (R26.81);Other abnormalities of gait and mobility (R26.89);Muscle weakness (generalized) (M62.81);Cognitive communication deficit (R41.841);Other symptoms and signs involving cognitive function                Time: 0937-1000 OT Time Calculation (min): 23 min Charges:  OT General Charges $OT Visit: 1 Visit OT Evaluation $OT Eval Moderate Complexity: 1 [address], OTD, OTR/L Acute Rehab [phone]   Mayer Masker 02/24/2022, 10:39 AM

## 2022-02-24 NOTE — Evaluation (Signed)
Physical Therapy Evaluation Patient Details Name: Jocelyn Shelton MRN: [id number] DOB: May 14, 1956 Today's Date: 02/24/2022  History of Present Illness  Pt is a 66 y/o F presenting to ED on 7/31 with AMS, decreased PO intake, and stage II on pressure ulcer on buttocks. Noted 07/2021 admission for confusion secondary to possible NPH. PMH includes emphyssema, HTN, HLD, CAD, GERD, dementia, avascular necrosis of bil humeral heads, MI , Raynaud's syndrome, scleroderma, and RA.  Clinical Impression   Pt presents with generalized weakness, impaired ability to transfer, poor balance, and decreased activity tolerance vs pt baseline. Pt to benefit from acute PT to address deficits. Pt overall requiring mod +2 for transfer OOB today, has a heavy posterior bias and needs multimodal cuing to mobilize. PT recommending ST-SNF to return to PLOF. PT to progress mobility as tolerated, and will continue to follow acutely.         Recommendations for follow up therapy are one component of a multi-disciplinary discharge planning process, led by the attending physician.  Recommendations may be updated based on patient status, additional functional criteria and insurance authorization.  Follow Up Recommendations Skilled nursing-short term rehab (<3 hours/day) Can patient physically be transported by private vehicle: Yes    Assistance Recommended at Discharge Frequent or constant Supervision/Assistance  Patient can return home with the following  A lot of help with walking and/or transfers;A lot of help with bathing/dressing/bathroom;Direct supervision/assist for medications management;Direct supervision/assist for financial management;Assistance with cooking/housework;Help with stairs or ramp for entrance;Assist for transportation    Equipment Recommendations None recommended by PT  Recommendations for Other Services       Functional Status Assessment Patient has had a recent decline in their functional status and  demonstrates the ability to make significant improvements in function in a reasonable and predictable amount of time.     Precautions / Restrictions Precautions Precautions: Fall Restrictions Weight Bearing Restrictions: No      Mobility  Bed Mobility Overal bed mobility: Needs Assistance Bed Mobility: Supine to Sit, Sit to Supine     Supine to sit: Min assist Sit to supine: Min assist   General bed mobility comments: incr time    Transfers Overall transfer level: Needs assistance Equipment used: 2 person hand held assist Transfers: Sit to/from Stand, Bed to chair/wheelchair/BSC Sit to Stand: Min assist, Mod assist, +2 physical assistance   Step pivot transfers: Min assist, Mod assist, +2 physical assistance       General transfer comment: assist +2 for power up, rise, steadying, and correcting posterior bias. Pt needs to see chair/bed to transfer to it, once pt understands the task she executes it with increased time but well.    Ambulation/Gait               General Gait Details: unable, heavy posterior leaning  Stairs            Wheelchair Mobility    Modified Rankin (Stroke Patients Only)       Balance Overall balance assessment: Needs assistance Sitting-balance support: Feet supported Sitting balance-Leahy Scale: Good Sitting balance - Comments: can reach outside BOS without LOB   Standing balance support: During functional activity Standing balance-Leahy Scale: Poor Standing balance comment: reliant on external support                             Pertinent Vitals/Pain Pain Assessment Pain Assessment: Faces Faces Pain Scale: No hurt Pain Intervention(s): Monitored during session  Home Living Family/patient expects to be discharged to:: Private residence Living Arrangements: Children Available Help at Discharge: Family Type of Home: Apartment Home Access: Stairs to enter Entrance Stairs-Rails: Engineer, site of Steps: Axis: One level Home Equipment: BSC/3in1;Cane - single point;Shower seat;Hand held shower head Additional Comments: info taken from chart review/previous admission    Prior Function Prior Level of Function : History of Falls (last six months);Needs assist   Mobility (Cognitive): Step by step cues   Physical Assist : Mobility (physical) Mobility (physical): Bed mobility;Stairs   Mobility Comments: Pt uses her cane and performs transfers and ambulates in home without assistance. She needs assistance for bed mobility and stairs. Hx of falls ADLs Comments: states "yes" when asked if daughter assists with ADLs at home     Hand Dominance   Dominant Hand: Right    Extremity/Trunk Assessment   Upper Extremity Assessment Upper Extremity Assessment: Defer to OT evaluation    Lower Extremity Assessment Lower Extremity Assessment: Generalized weakness    Cervical / Trunk Assessment Cervical / Trunk Assessment: Normal  Communication   Communication: Other (comment) (only answering "yes" during session)  Cognition Arousal/Alertness: Awake/alert Behavior During Therapy: WFL for tasks assessed/performed, Flat affect, Restless Overall Cognitive Status: Difficult to assess                                 General Comments: pt following commands appropriately during session, only responding "Yes" to certain questions asked during session; hx of dementia at baseline        General Comments General comments (skin integrity, edema, etc.): VSS on RA    Exercises     Assessment/Plan    PT Assessment Patient needs continued PT services  PT Problem List Decreased strength;Decreased mobility;Decreased activity tolerance;Decreased balance;Decreased knowledge of use of DME;Decreased cognition;Decreased skin integrity;Decreased safety awareness       PT Treatment Interventions DME instruction;Therapeutic activities;Gait training;Therapeutic  exercise;Patient/family education;Balance training;Neuromuscular re-education;Functional mobility training    PT Goals (Current goals can be found in the Care Plan section)  Acute Rehab PT Goals Patient Stated Goal: return to PLOF (inferred) PT Goal Formulation: With patient Time For Goal Achievement: 03/10/22 Potential to Achieve Goals: Good    Frequency Min 2X/week     Co-evaluation PT/OT/SLP Co-Evaluation/Treatment: Yes Reason for Co-Treatment: For patient/therapist safety;To address functional/ADL transfers PT goals addressed during session: Mobility/safety with mobility;Balance OT goals addressed during session: ADL's and self-care       AM-PAC PT "6 Clicks" Mobility  Outcome Measure Help needed turning from your back to your side while in a flat bed without using bedrails?: A Little Help needed moving from lying on your back to sitting on the side of a flat bed without using bedrails?: A Lot Help needed moving to and from a bed to a chair (including a wheelchair)?: A Lot Help needed standing up from a chair using your arms (e.g., wheelchair or bedside chair)?: A Lot Help needed to walk in hospital room?: A Lot Help needed climbing 3-5 steps with a railing? : Total 6 Click Score: 12    End of Session Equipment Utilized During Treatment: Gait belt Activity Tolerance: Patient tolerated treatment well Patient left: in bed;with call bell/phone within reach;with bed alarm set Nurse Communication: Mobility status PT Visit Diagnosis: Other abnormalities of gait and mobility (R26.89);Muscle weakness (generalized) (M62.81);Adult, failure to thrive (R62.7)    Time: 0937-1000 PT Time  Calculation (min) (ACUTE ONLY): 23 min   Charges:   PT Evaluation $PT Eval Low Complexity: 1 Low          Ramona Slinger S, PT DPT Acute Rehabilitation Services Pager [phone]  Office [phone]   Chenille Toor E Christain [place] 02/24/2022, 11:10 AM

## 2022-02-25 DIAGNOSIS — R4182 Altered mental status, unspecified: Secondary | ICD-10-CM | POA: Diagnosis not present

## 2022-02-25 DIAGNOSIS — F03C0 Unspecified dementia, severe, without behavioral disturbance, psychotic disturbance, mood disturbance, and anxiety: Secondary | ICD-10-CM | POA: Diagnosis not present

## 2022-02-25 DIAGNOSIS — F339 Major depressive disorder, recurrent, unspecified: Secondary | ICD-10-CM

## 2022-02-25 DIAGNOSIS — R627 Adult failure to thrive: Secondary | ICD-10-CM | POA: Diagnosis not present

## 2022-02-25 DIAGNOSIS — E860 Dehydration: Secondary | ICD-10-CM | POA: Diagnosis not present

## 2022-02-25 DIAGNOSIS — R6889 Other general symptoms and signs: Secondary | ICD-10-CM | POA: Diagnosis not present

## 2022-02-25 DIAGNOSIS — F03C3 Unspecified dementia, severe, with mood disturbance: Secondary | ICD-10-CM | POA: Diagnosis not present

## 2022-02-25 LAB — URINE CULTURE

## 2022-02-25 LAB — BASIC METABOLIC PANEL
Anion gap: 6 (ref 5–15)
BUN: 11 mg/dL (ref 8–23)
CO2: 28 mmol/L (ref 22–32)
Calcium: 8.8 mg/dL — ABNORMAL LOW (ref 8.9–10.3)
Chloride: 103 mmol/L (ref 98–111)
Creatinine, Ser: 0.79 mg/dL (ref 0.44–1.00)
GFR, Estimated: >60 mL/min (ref >60)
Glucose, Bld: 105 mg/dL — ABNORMAL HIGH (ref 70–99)
Potassium: 3.9 mmol/L (ref 3.5–5.1)
Sodium: 137 mmol/L (ref 135–145)

## 2022-02-25 NOTE — Consult Note (Signed)
Neurology Consultation  Reason for Consult: AMS Referring Physician: Dr. Lum Babe  CC: AMS  History is obtained from:daughter April via phone   HPI: Jocelyn Shelton is a 66 y.o. female with a past medical history of Dementia, COPD, HTN, HLD, CAD, GERD, MDD, RA, scleroderma, and Raynauds who presents to the ED for evaluation of altered mental status. Per daughter April, in January of 2023, she began talking slow and had issues getting up from the bed. From this point forward she has had decline with her memory and baseline functioning. She was admitted 08/01/2021 and discharged to a SNF for 3 months due to altered mental status and dehydration. She was at rehab til the end of March at which point she was able to talk a few words or sentences, can usually tell you her name and at times date of birth or age, but would not know the day, year, or current events, walk with a cane and feed herself with some assistance. Daughter has been in charge of her medications. She needed assistance to bathe and dress herself. Starting on July 5th, patient has been incontinent, not able to walk, not wanting to get out of bed or eat and speaking fewer words than normal. Recent visit to the ED on 7/23 for weakness because patient has been so weak that when the daughter assists her to a sitting position in the bed the patient will just fall over and she has been having to carry her to the bathroom.    ROS:  Unable to obtain due to altered mental status.   Past Medical History:  Diagnosis Date   Avascular necrosis of humeral head, left (HCC) 06/17/2017   Avascular necrosis of humeral head, right (HCC) 06/17/2017   Bronchiectasis (HCC) 06/17/2017   CAD in native artery 05/23/2015   High cholesterol    Hypertension    Leg pain    ABIs 1/18: R 1.2, L 1.1   Myocardial infarction (HCC) 04/2015   Raynauds syndrome 09/23/2006   Qualifier: Diagnosis of  By: Moreno-Coll  MD, Adlih     Reflux    Rheumatoid arthritis (HCC)     "all over" (06/16/2017)   Scleroderma (HCC)      Family History  Problem Relation Age of Onset   Hypertension Father    Heart attack Mother    Diabetes Daughter    Diabetes Sister    Lung cancer Brother 58     Social History:   reports that she has quit smoking. Her smoking use included cigarettes. She has a 23.00 pack-year smoking history. She has never used smokeless tobacco. She reports current alcohol use of about 4.0 standard drinks of alcohol per week. She reports that she does not use drugs.  Medications  Current Facility-Administered Medications:    acetaminophen (TYLENOL) tablet 1,000 mg, 1,000 mg, Oral, Q6H PRN, Celine Mans, MD   albuterol (PROVENTIL) (2.5 MG/3ML) 0.083% nebulizer solution 2.5 mg, 2.5 mg, Nebulization, Q6H PRN, Nestor Ramp, MD   aspirin EC tablet 81 mg, 81 mg, Oral, Daily, Celine Mans, MD, 81 mg at 02/24/22 1207   carvedilol (COREG) tablet 25 mg, 25 mg, Oral, BID WC, Celine Mans, MD, 25 mg at 02/24/22 2010   enoxaparin (LOVENOX) injection 40 mg, 40 mg, Subcutaneous, Q24H, Lilland, Alana, DO, 40 mg at 02/24/22 1207   feeding supplement (ENSURE ENLIVE / ENSURE PLUS) liquid 237 mL, 237 mL, Oral, TID BM, Mahmood, Atif, MD, 237 mL at 02/24/22 1831   fluticasone (FLONASE) 50  MCG/ACT nasal spray 1 spray, 1 spray, Each Nare, Daily, Celine Mans, MD, 1 spray at 02/24/22 1225   folic acid (FOLVITE) tablet 1 mg, 1 mg, Oral, Daily, Celine Mans, MD, 1 mg at 02/24/22 1206   liver oil-zinc oxide (DESITIN) 40 % ointment, , Topical, BID, Nestor Ramp, MD, Given at 02/24/22 2010   melatonin tablet 3 mg, 3 mg, Oral, QHS, Mahmood, Atif, MD, 3 mg at 02/24/22 2010   pantoprazole (PROTONIX) EC tablet 40 mg, 40 mg, Oral, Daily, Celine Mans, MD, 40 mg at 02/24/22 1206   rosuvastatin (CRESTOR) tablet 20 mg, 20 mg, Oral, Daily, Celine Mans, MD, 20 mg at 02/24/22 1206   umeclidinium-vilanterol (ANORO ELLIPTA) 62.5-25 MCG/ACT 1 puff, 1 puff,  Inhalation, Daily, Celine Mans, MD, 1 puff at 02/24/22 0909   Exam: Current vital signs: BP (!) 152/84   Pulse 79   Temp 99.6 F (37.6 C) (Oral)   Resp 16   SpO2 100%  Vital signs in last 24 hours: Temp:  [98.9 F (37.2 C)-99.6 F (37.6 C)] 99.6 F (37.6 C) (08/02 0737) Pulse Rate:  [79-82] 79 (08/02 0737) BP: (117-152)/(66-84) 152/84 (08/02 0737) SpO2:  [97 %-100 %] 100 % (08/02 0737)  GENERAL: Awake, alert in NAD HEENT: - Normocephalic and atraumatic, dry mm LUNGS - Clear to auscultation bilaterally with no wheezes CV - S1S2 RRR, no m/r/g, equal pulses bilaterally. ABDOMEN - Soft, nontender, nondistended with normoactive BS Ext: warm, well perfused, intact peripheral pulses, no edema  NEURO:  Mental Status: AA&Ox1. She was able to state her name. She does not follow commands, she will mimic actions. She tracks Language: speech is hypophonic. No naming objects or repeating Cranial Nerves: PERRL, EOMI, visual fields full to threat bilaterally, and tracks no facial asymmetry, facial sensation intact, hearing intact, tongue/uvula/soft palate midline, normal sternocleidomastoid and trapezius muscle strength. No evidence of tongue atrophy or fibrillations Motor: 4/5 in all 4 extremities Tone: is decreased  Sensation- Intact to noxious stimuli Coordination: unable to assess Gait- deferred    Labs I have reviewed labs in epic and the results pertinent to this consultation are:  CBC    Component Value Date/Time   WBC 6.8 02/24/2022 0100   RBC 3.83 (L) 02/24/2022 0100   HGB 11.0 (L) 02/24/2022 0100   HGB 10.8 (L) 11/19/2021 1219   HCT 33.4 (L) 02/24/2022 0100   HCT 33.4 (L) 11/19/2021 1219   PLT 144 (L) 02/24/2022 0100   PLT 213 11/19/2021 1219   MCV 87.2 02/24/2022 0100   MCV 93 11/19/2021 1219   MCH 28.7 02/24/2022 0100   MCHC 32.9 02/24/2022 0100   RDW 12.8 02/24/2022 0100   RDW 12.8 11/19/2021 1219   LYMPHSABS 0.7 02/23/2022 1655   LYMPHSABS 0.4 (L)  03/30/2017 0922   MONOABS 0.4 02/23/2022 1655   EOSABS 0.4 02/23/2022 1655   EOSABS 0.1 03/30/2017 0922   BASOSABS 0.0 02/23/2022 1655   BASOSABS 0.0 03/30/2017 0922    CMP     Component Value Date/Time   NA 137 02/25/2022 0559   NA 139 11/19/2021 1219   K 3.9 02/25/2022 0559   CL 103 02/25/2022 0559   CO2 28 02/25/2022 0559   GLUCOSE 105 (H) 02/25/2022 0559   BUN 11 02/25/2022 0559   BUN 16 11/19/2021 1219   CREATININE 0.79 02/25/2022 0559   CREATININE 0.97 07/14/2016 0835   CALCIUM 8.8 (L) 02/25/2022 0559   PROT 7.1 02/24/2022 0302   PROT 7.5 11/19/2021 1219  ALBUMIN 3.1 (L) 02/24/2022 0302   ALBUMIN 4.0 11/19/2021 1219   AST 45 (H) 02/24/2022 0302   ALT 33 02/24/2022 0302   ALKPHOS 88 02/24/2022 0302   BILITOT 0.8 02/24/2022 0302   BILITOT 0.2 11/19/2021 1219   GFRNONAA >60 02/25/2022 0559   GFRAA 67 04/26/2020 1231    Lipid Panel     Component Value Date/Time   CHOL 163 11/19/2021 1221   TRIG 110 11/19/2021 1221   HDL 41 11/19/2021 1221   CHOLHDL 4.0 11/19/2021 1221   CHOLHDL 2.5 06/17/2017 0559   VLDL 8 06/17/2017 0559   LDLCALC 102 (H) 11/19/2021 1221    LABS: Ethanol negative UDS negative UA dirty, Culture pending TSH 2.063 Folate 32.3 Vit B12 996 Ammonia negative  Vit B1 pending   rEEG 8/1: This study is suggestive of mild diffuse encephalopathy, non specific etiology. No seizures or epileptiform discharges were seen throughout the recording.   Imaging I have reviewed the images obtained:  CT-head 1. No acute intracranial pathology. 2. Mild age-related atrophy and chronic microvascular ischemic changes.  MRI examination of the brain No acute abnormality no change from prior studies Atrophy and chronic microvascular ischemia  Assessment:  Ezinne LOUANA AKIN is a 66 y.o. female with a past medical history of Dementia, COPD, HTN, HLD, CAD, GERD, MDD, RA, scleroderma, and Raynauds who presents to the ED for evaluation of altered mental status.  Patient has had a progressive decline since January 2023 and is now non verbal, and needs total assist with ADLS and she is not wanting to eat or drink. MRI brain showed no acute intracranial process. EEG unremarkable. Infectious/metabolic workup unrevealing. Patient is not on any deliriogenic medications. Suspect encephalopathy is 2/2 progression of her underlying dementia, although it does not appear she has been followed by neurology as an outpatient.   It appears that plan is to discharge her to facility tomorrow with hospice. No further inpatient neurologic workup is recommended at this time. If family wishes to continue further outpatient neurologic assessment, please place amb referral to neurology at time of discharge. If that is not c/w goals of care, then no further workup should be performed. Neurology to sign off, but please re-engage if additional neurologic concerns arise.  Note written by Gevena Mart NP and edited by MD  Neurology Attending Attestation   I examined the patient and discussed plan with Ms. Artis Flock NP. Above note has been edited by me to reflect my findings and recommendations. I personally reviewed CNS imaging.   Bing Neighbors, MD [facility] [phone]   If 7pm- 7am, please page neurology on call as listed in AMION.

## 2022-02-25 NOTE — Assessment & Plan Note (Addendum)
Not tolerating PO on admission, but intake has improved since then. Cr initially elevated to 1.12 on admission, now improved to 0.79. However, urine at bedside appears dark. - Off IVF - Nursing: offer of water q4h while awake to encourage hydration - Assist w/ feeds, ensure supplement - strict I/O

## 2022-02-25 NOTE — Progress Notes (Signed)
Daily Progress Note Intern Pager: [phone]  Patient name: Jocelyn Shelton Medical record number: [id number] Date of birth: [birth year] Age: 66 y.o. Gender: female  Primary Care Provider: Bess Kinds, MD Consultants: Palliative, neurology Code Status: DNR/DNI  Pt Overview and Major Events to Date:  7/31 - admitted  Assessment and Plan:  Jocelyn Shelton is a 66yo F admitted for AMS likely 2/2 progressing dementia. Less likely seizure given EEG and MRI findings. Poor prognosis per palliative team, planning for hospice. Pertinent PMH/PSH includes Dementia, COPD, HTN, CAD s/p cath 2016, RA/Scleroderma/Raynaud's.   * AMS (altered mental status) Likely chronic due to progression of dementia.  Lab work and imaging (MRI, CT, CXR) negative thus far for acute pathology of altered mental status. EEG w/ signs of mild diffuse encephalopathy. - F/u neuro outpatient - Follow-up thiamine, urine culture, blood cultures - Palliative care following, dispo SNF/hospice tomorrow - PT/OT following - Vitals per floor - Delirium precautions - Fall precautions   Moderate dementia (HCC) Feel presentation most likely related to worsening dementia, but evaluating for other etiologies given acute decline (see above). Nonverbal and with new inability to walk and decreased oral intake. Tolerating oral feeds and Cr has improved from admission. - Soft diet as able, Ensure supplement - Assist with feeds - Crush meds in applesauce for administration - Holding Namenda - Palliative consult for goals of care - TOC for SNF/hospice as family cannot care for her  Pressure ulcer of right buttock, stage 2 (HCC) See picture in media tab. - Seen by wound care - Dressing changes ordered, turn pt q2h  Coronary artery disease involving native coronary artery of native heart without angina pectoris EKG today unchanged from prior. Last ECHO from 06/17/17 grade 2 diastolic dysfunction, EF 55-60%. --Continue Aspirin  81mg  daily --Continue Crestor 20mg  daily  COPD (chronic obstructive pulmonary disease) (HCC) --Continue Albuterol  --Continue Anoro Ellipta  Dehydration Not tolerating PO on admission, but intake has improved since then. Cr initially elevated to 1.12 on admission, now improved to 0.79. However, urine at bedside appears dark. - Off IVF - Nursing: offer of water q4h while awake to encourage hydration - Assist w/ feeds, ensure supplement - strict I/O  Hypertension Hypotensive on arrival with good response to 1L bolus. Per daughter patient not taking Diltiazem (uses PRN for BP) since early July. -- Continue Coreg 25mg  BID -- Monitor BP with vitals  Major depressive disorder, recurrent episode (HCC) Unable assess status as patient is non-verbal -dc'd doxepin, replaced w melatonin       FEN/GI: soft diet, Ensure supplements PPx: Lovenox Dispo:SNF tomorrow. Barriers include hydration   Subjective:  Nonverbal, continues to make eye contact, shakes hand, and wiggles toes on command, same as yesterday  Objective: Temp:  [98.9 F (37.2 C)-99.6 F (37.6 C)] 99.6 F (37.6 C) (08/02 0737) Pulse Rate:  [79-82] 79 (08/02 0737) Resp:  [16] 16 (08/02 0817) BP: (117-152)/(66-84) 152/84 (08/02 0737) SpO2:  [100 %] 100 % (08/02 0817)  Continues to be hypertensive to 150s/80s, otherwise VSS on RA  Physical Exam: General: Alert, nonverbal, tracks with eyes, occasionally follows simple commands Cardiovascular: RRR, no m/r/g Respiratory: CTAB, normal WOB Abdomen: soft, NT/ND Extremities: Moves all extremities spontaneously Neuro: Moves extremities, tracks with eyes, wiggles toes on command  Laboratory: Most recent CBC Lab Results  Component Value Date   WBC 6.8 02/24/2022   HGB 11.0 (L) 02/24/2022   HCT 33.4 (L) 02/24/2022   MCV 87.2 02/24/2022   PLT  144 (L) 02/24/2022   Most recent BMP    Latest Ref Rng & Units 02/25/2022    5:59 AM  BMP  Glucose 70 - 99 mg/dL 979    BUN 8 - 23 mg/dL 11   Creatinine 8.92 - 1.00 mg/dL 1.19   Sodium 417 - 408 mmol/L 137   Potassium 3.5 - 5.1 mmol/L 3.9   Chloride 98 - 111 mmol/L 103   CO2 22 - 32 mmol/L 28   Calcium 8.9 - 10.3 mg/dL 8.8     Other pertinent labs  Pending urine cx, blood cx   Imaging/Diagnostic Tests:  MRI Head 8/1: Radiologist impression: No acute abnormality no change from prior studies Atrophy and chronic microvascular ischemia    EEG 8/1: Neurologist impression: This study is suggestive of mild diffuse encephalopathy, non specific etiology. No seizures or epileptiform discharges were seen throughout the recording.    Jocelyn Drafts, MD 02/25/2022, 11:12 AM  PGY-1, [facility] FPTS Intern pager: [phone], text pages welcome Secure chat group [facility] Teaching Service

## 2022-02-25 NOTE — Progress Notes (Addendum)
Civil engineer, contracting [facility]) Hospital Liaison Note  Referral received for patient/family interest in hospice at [facility]). [facility] liaison spoke with patient's daughter April to confirm interest. Interest confirmed.   Hospice eligibility pending.   Plan is to discharge to [facility] via [place] tomorrow.   DME in the home: none  DME needs: none  Please send patient home with comfort medications/prescriptions at discharge.   Please call with any questions or concerns. Thank you  Dionicio Stall, [facility] Liaison [phone]

## 2022-02-25 NOTE — TOC Transition Note (Addendum)
Transition of Care [facility]) - CM/SW Discharge Note   Patient Details  Name: Jocelyn Shelton MRN: [id number] Date of Birth: Nov 30, 1955  Transition of Care [facility]) CM/SW Contact:  Janae Bridgeman, RN Phone Number: 02/25/2022, 10:34 AM   Clinical Narrative:    CM called and spoke with the patient's daughter on the phone and presented the bed offer for admission to [facility] skilled nursing.  The patient's daughter accepted the bed offer for placement and the patient will use her Medicaid benefits for placement since she has Medicare Part B.  Jocelyn Shelton is checking on bed availability and if she is medically stable for discharge - may likely discharge to the facility in the next 1-2 days.  I discuss with the patient's daughter for Hospice care support in the skilled nursing facility and the daughter states that patient is currently being followed by [facility].  [facility] was updated for outpatient support in the facility.  Jocelyn Shelton has bed available for admission for the patient for 02/26/2022.  [facility] has accepted for hospice support at the facility.  Medical Team signed the DNR and patient will be ready for discharge tomorrow.  [facility] ambulance was called and ambulance transport is set up for transfer to the facility at 2 pm.  CM will follow up with Jocelyn Shelton to determine if patient can discharge to the facility tomorrow/ 02/26/22 - I will updated the Medical Team Physicians.   Final next level of care: Skilled Nursing Facility Barriers to Discharge: Continued Medical Work up   Patient Goals and CMS Choice Patient states their goals for this hospitalization and ongoing recovery are:: Patient unable to states goals for care.  The patient's daughter requests SNF placement. CMS Medicare.gov Compare Post Acute Care list provided to:: Patient Represenative (must comment) (Daughter - Jocelyn Shelton [phone]) Choice offered to / list presented to : Adult  Children  Discharge Placement                       Discharge Plan and Services   Discharge Planning Services: CM Consult Post Acute Care Choice: Skilled Nursing Facility                               Social Determinants of Health (SDOH) Interventions     Readmission Risk Interventions    02/24/2022    1:59 PM  Readmission Risk Prevention Plan  Transportation Screening Complete  PCP or Specialist Appt within 5-7 Days Complete  Home Care Screening Complete  Medication Review (RN CM) Complete

## 2022-02-26 DIAGNOSIS — R4182 Altered mental status, unspecified: Secondary | ICD-10-CM | POA: Diagnosis not present

## 2022-02-26 DIAGNOSIS — E860 Dehydration: Secondary | ICD-10-CM | POA: Diagnosis not present

## 2022-02-26 DIAGNOSIS — F03C3 Unspecified dementia, severe, with mood disturbance: Secondary | ICD-10-CM | POA: Diagnosis not present

## 2022-02-26 LAB — BASIC METABOLIC PANEL
Anion gap: 7 (ref 5–15)
BUN: 14 mg/dL (ref 8–23)
CO2: 27 mmol/L (ref 22–32)
Calcium: 8.5 mg/dL — ABNORMAL LOW (ref 8.9–10.3)
Chloride: 102 mmol/L (ref 98–111)
Creatinine, Ser: 0.88 mg/dL (ref 0.44–1.00)
GFR, Estimated: >60 mL/min (ref >60)
Glucose, Bld: 124 mg/dL — ABNORMAL HIGH (ref 70–99)
Potassium: 3.7 mmol/L (ref 3.5–5.1)
Sodium: 136 mmol/L (ref 135–145)

## 2022-02-26 MED ORDER — HALOPERIDOL 5 MG PO TABS: 5.0000 mg | ORAL_TABLET | ORAL | 0 refills | Status: AC | PRN

## 2022-02-26 MED ORDER — HYOSCYAMINE SULFATE 0.125 MG SL SUBL: 0.1250 mg | SUBLINGUAL_TABLET | SUBLINGUAL | 0 refills | Status: AC | PRN

## 2022-02-26 MED ORDER — ZINC OXIDE 40 % EX OINT: TOPICAL_OINTMENT | Freq: Two times a day (BID) | CUTANEOUS | 0 refills | Status: AC

## 2022-02-26 MED ORDER — ARFORMOTEROL TARTRATE 15 MCG/2ML IN NEBU
15.0000 ug | INHALATION_SOLUTION | Freq: Two times a day (BID) | RESPIRATORY_TRACT | Status: DC
  Administered 2022-02-26: 15 ug via RESPIRATORY_TRACT
  Filled 2022-02-26: qty 2

## 2022-02-26 MED ORDER — REVEFENACIN 175 MCG/3ML IN SOLN
175.0000 ug | Freq: Every day | RESPIRATORY_TRACT | Status: DC
  Administered 2022-02-26: 175 ug via RESPIRATORY_TRACT
  Filled 2022-02-26: qty 3

## 2022-02-26 MED ORDER — SENNOSIDES 8.6 MG PO TABS: 2.0000 | ORAL_TABLET | Freq: Two times a day (BID) | ORAL | 0 refills | Status: AC

## 2022-02-26 MED ORDER — PROCHLORPERAZINE MALEATE 10 MG PO TABS: 10.0000 mg | ORAL_TABLET | ORAL | 0 refills | Status: AC | PRN

## 2022-02-26 MED ORDER — ALBUTEROL SULFATE (2.5 MG/3ML) 0.083% IN NEBU
2.5000 mg | INHALATION_SOLUTION | Freq: Four times a day (QID) | RESPIRATORY_TRACT | 12 refills | Status: AC | PRN
Start: 2022-02-26 — End: ?

## 2022-02-26 MED ORDER — MORPHINE SULFATE (CONCENTRATE) 20 MG/ML PO SOLN: 10.0000 mg | ORAL | 0 refills | Status: AC | PRN

## 2022-02-26 MED ORDER — IPRATROPIUM BROMIDE 0.02 % IN SOLN: 0.5000 mg | Freq: Four times a day (QID) | RESPIRATORY_TRACT | 12 refills | Status: AC

## 2022-02-26 MED ORDER — ENSURE ENLIVE PO LIQD: 237.0000 mL | Freq: Three times a day (TID) | ORAL | 12 refills | Status: AC

## 2022-02-26 MED ORDER — MELATONIN 3 MG PO TABS
3.0000 mg | ORAL_TABLET | Freq: Every day | ORAL | 0 refills | Status: AC
Start: 2022-02-26 — End: ?

## 2022-02-26 MED ORDER — LORAZEPAM 0.5 MG PO TABS: 0.5000 mg | ORAL_TABLET | ORAL | 0 refills | Status: AC | PRN

## 2022-02-26 MED ORDER — ARFORMOTEROL TARTRATE 15 MCG/2ML IN NEBU: 15.0000 ug | INHALATION_SOLUTION | Freq: Two times a day (BID) | RESPIRATORY_TRACT | 0 refills | Status: DC

## 2022-02-26 NOTE — Progress Notes (Signed)
This RN has attempted to call Jocelyn Shelton three times to give report, with no answer.  Will try again before 1400 pick up.

## 2022-02-26 NOTE — Discharge Instructions (Signed)
Jocelyn Shelton was admitted to the inpatient Family Medicine Teaching Service due to concern for altered mental status. This was most likely due to progression of her dementia. Her workup for other things like stroke and seizures was negative.  [name] will be discharged to a Skilled Nursing Facility with Hospice care. Her medications have been adjusted to provide her with comfort during this process.

## 2022-02-26 NOTE — Progress Notes (Signed)
Occupational Therapy Treatment Patient Details Name: Jocelyn Shelton MRN: [id number] DOB: [birth year] Today's Date: 02/26/2022   History of present illness Pt is a 66 y/o F presenting to ED on 7/31 with AMS, decreased PO intake, and stage II on pressure ulcer on buttocks. Noted 07/2021 admission for confusion secondary to possible NPH. PMH includes emphyssema, HTN, HLD, CAD, GERD, dementia, avascular necrosis of bil humeral heads, MI , Raynaud's syndrome, scleroderma, and RA.   OT comments  Pt in bed upon therapy arrival and eating breakfast. Pt nonverbal and did not present with any form of verbal or nonverbal communication to therapist during session. Session focused on ADL re-training and activity participation and sequencing during self feeding and grooming task. Pt demonstrates  possible impaired left side attention when eating her breakfast (see vision section below). When OT rotated plate to the right to bring food to top of patient's visual field, she continued to use her fork to self feed. No difficulty was noted with visual attention, as patient was able to locate and look at OT who stood on her left side during session. No activity initiation demonstrated. Pt provided with VC to initiate face washing with visual aid of task when presented with washcloth. Planned discharge today to SNF per chart. OT will continue to follow acutely if d/c falls through.   Recommendations for follow up therapy are one component of a multi-disciplinary discharge planning process, led by the attending physician.  Recommendations may be updated based on patient status, additional functional criteria and insurance authorization.    Follow Up Recommendations  Skilled nursing-short term rehab (<3 hours/day)    Assistance Recommended at Discharge Frequent or constant Supervision/Assistance  Patient can return home with the following  Help with stairs or ramp for entrance;Assist for transportation;Direct  supervision/assist for financial management;Direct supervision/assist for medications management;Assistance with feeding;Assistance with cooking/housework;A lot of help with bathing/dressing/bathroom;A lot of help with walking and/or transfers         Precautions / Restrictions Precautions Precautions: Fall Precaution Comments: dementia, nonverbal Restrictions Weight Bearing Restrictions: No        ADL either performed or assessed with clinical judgement   ADL   Eating/Feeding: Set up;Supervision/ safety;Bed level   Grooming: Wash/dry face;Supervision/safety;Set up;Bed level;Cueing for sequencing         Vision Baseline Vision/History: 1 Wears glasses Additional Comments: Observed during breakfast that patient did not attend to left side of her plate or breakfast tray. Ate all pancakes and sausage located on right side of plate, whole banana located on right side of tray and drank all Ensure located on right side of tray. Untouched coffee and OJ located on left side of tray to left of plate.          Cognition Arousal/Alertness: Awake/alert Behavior During Therapy: Flat affect Overall Cognitive Status: Difficult to assess     General Comments: followed 1 step commands inconsistently                   Pertinent Vitals/ Pain       Pain Assessment Pain Assessment: Faces Faces Pain Scale: No hurt         Frequency  Min 2X/week        Progress Toward Goals  OT Goals(current goals can now be found in the care plan section)  Progress towards OT goals: Progressing toward goals     Plan Discharge plan remains appropriate;Frequency remains appropriate       AM-PAC OT "6 Clicks" Daily  Activity     Outcome Measure   Help from another person eating meals?: A Little Help from another person taking care of personal grooming?: A Little Help from another person toileting, which includes using toliet, bedpan, or urinal?: Total Help from another person bathing  (including washing, rinsing, drying)?: Total Help from another person to put on and taking off regular upper body clothing?: A Lot Help from another person to put on and taking off regular lower body clothing?: Total 6 Click Score: 11    End of Session    OT Visit Diagnosis: Unsteadiness on feet (R26.81);Other abnormalities of gait and mobility (R26.89);Muscle weakness (generalized) (M62.81);Cognitive communication deficit (R41.841);Other symptoms and signs involving cognitive function   Activity Tolerance Patient tolerated treatment well   Patient Left in bed;with call bell/phone within reach;with bed alarm set           Time: 0930-1000 OT Time Calculation (min): 30 min  Charges: OT General Charges $OT Visit: 1 Visit OT Treatments $Self Care/Home Management : 23-37 mins  Limmie Patricia, OTR/L,CBIS  Supplemental OT - [facility] and [facility]   Malyssa Maris, Charisse March 02/26/2022, 10:06 AM

## 2022-02-26 NOTE — Progress Notes (Signed)
Report given to receiving facility [facility]. Both IV'S removed and tolerated well.

## 2022-02-26 NOTE — Discharge Summary (Addendum)
Family Medicine Teaching [facility] Discharge Summary  Patient name: Jocelyn Shelton Medical record number: [id number] Date of birth: 1955/12/24 Age: 66 y.o. Gender: female Date of Admission: 02/23/2022  Date of Discharge: 02/26/2022 Admitting Physician: Celine Mans, MD  Primary Care Provider: Bess Kinds, MD Consultants: Neurology  Indication for Hospitalization: Altered mental status, progressing dementia  Brief Hospital Course:  Jocelyn Shelton is a 66 y.o. female who presented with altered mental status. Patient was admitted to [facility] Medicine Teaching Service for work-up. Please see problem based hospital course below:  Altered Mental Status Patient presented unable to tolerate PO, and unable to speak. Patient was hypotensive with otherwise stable vitals in ED and responded well 1 litre bolus of LR as well as continuous IVF overnight. PO intake improved by morning after admission. Metabolic panel was reassuring and CT head was negative. Ammonia, TSH, Folate were all normal. Vitamin B12 was elevated at 996. MRI brain showed atrophy and chronic microvascular ischemia with no interval changes. EEG was also performed and showed mild diffuse encephalopathy. At discharge, pt was tolerating PO soft diet with assistance. Neurology consult was placed given rapid progressive of dementia. Neurology suspected that AMS was due to progressive dementia and recommended no further inpatient Neurologic work up.   Moderate dementia Patient presented nonverbal with new inability to walk and decreased oral intake. Started on IV fluids and soft diet. Palliative was consulted and recommended SNF placement w/ hospice referral. Sent w comfort meds.  Dehydration Not tolerating PO on admission and started on IVF, but intake improved by the following morning and fluids dc'd. Cr initially elevated to 1.12, improved to 0.88 by discharge.   Hypertension Patient was hypotensive on arrival to ED. Pressure  responded to 1 litre bolus of lactated ringers. Patient was continued on home Carvedilol.  Sacral Pressure Ulcer, stage 1 Wound care was consulted on admission. They recommended desitin cream, frequent repositioning, and dressing changes.  Chronic Stable Conditions COPD - Patient was switched to nebulized solutions (Proventil, Brovana/Ipratropium) given difficulty with inhalers Coronary Artery Disease - Patient was continued on Aspirin, dc'd at discharge. Major Depressive Disorder - Doxepin was discontinued.  Discharge Recommendations: Hospice care per palliative recs Ensure good PO intake and hydration, pt will need assistance with feeds and needs prompting to drink fluids Continue frequent repositioning for sacral ulcer May place outpatient referral to neurology if family desires, otherwise no further workup if family wishes  Discharge Diagnoses/Problem List:  Principal Problem:   AMS (altered mental status) Active Problems:   Moderate dementia (HCC)   Major depressive disorder, recurrent episode (HCC)   Hypertension   Dehydration   COPD (chronic obstructive pulmonary disease) (HCC)   Coronary artery disease involving native coronary artery of native heart without angina pectoris   Pressure ulcer of right buttock, stage 2 (HCC)  Disposition: SNF with hospice  Discharge Condition: stable  Discharge Exam:  Blood pressure 135/75, pulse 83, temperature 99.4 F (37.4 C), temperature source Oral, resp. rate 16, SpO2 99 %. General: Alert, nonverbal, tracks with eyes, occasionally follows simple commands Cardiovascular: RRR, no m/r/g Respiratory: CTAB, normal WOB Abdomen: soft, NT/ND Extremities: Moves all extremities spontaneously Neuro: Moves extremities, tracks with eyes, wiggles toes on command  Issues for Follow Up:  Hospice care per palliative recs Ensure good PO intake and hydration, pt will need assistance with feeds and needs prompting to drink fluids Continue frequent  repositioning for sacral ulcer May place outpatient referral to neurology if family desires, otherwise no further  workup  Significant Procedures:  EEG 8/1: Neurologist impression: This study is suggestive of mild diffuse encephalopathy, non specific etiology. No seizures or epileptiform discharges were seen throughout the recording.   Significant Labs and Imaging:  No results for input(s): "WBC", "HGB", "HCT", "PLT" in the last 48 hours. Recent Labs  Lab 02/25/22 0559 02/26/22 0544  NA 137 136  K 3.9 3.7  CL 103 102  CO2 28 27  GLUCOSE 105* 124*  BUN 11 14  CREATININE 0.79 0.88  CALCIUM 8.8* 8.5*   MRI Head 8/1: Radiologist impression: No acute abnormality no change from prior studies Atrophy and chronic microvascular ischemia   Results/Tests Pending at Time of Discharge: Vit B1, final blood cx result  Discharge Medications:  Allergies as of 02/26/2022       Reactions   Ticagrelor Shortness Of Breath   Cymbalta [duloxetine Hcl] Diarrhea, Nausea Only   No appetite, stomach pain   Sulfasalazine Other (See Comments), Cough   After taking this for a period of time, this medication makes the patient COUGH   Lisinopril Cough   Olanzapine Other (See Comments)   Interfered with Blood pressure medications   Tomato Rash        Medication List     STOP taking these medications    albuterol 108 (90 Base) MCG/ACT inhaler Commonly known as: Proventil HFA Replaced by: albuterol (2.5 MG/3ML) 0.083% nebulizer solution   Anoro Ellipta 62.5-25 MCG/ACT Aepb Generic drug: umeclidinium-vilanterol   aspirin EC 81 MG tablet   CALCIUM 600+D3 PO   dextromethorphan-guaiFENesin 30-600 MG 12hr tablet Commonly known as: MUCINEX DM   Doxepin HCl 6 MG Tabs   fluticasone 50 MCG/ACT nasal spray Commonly known as: FLONASE   folic acid 1 MG tablet Commonly known as: FOLVITE   ipratropium 0.03 % nasal spray Commonly known as: ATROVENT   memantine 10 MG tablet Commonly known as:  NAMENDA   multivitamin with minerals Tabs tablet   rosuvastatin 20 MG tablet Commonly known as: CRESTOR       TAKE these medications    acetaminophen 500 MG tablet Commonly known as: TYLENOL Take 1,000 mg by mouth every 6 (six) hours as needed for moderate pain or headache.   albuterol (2.5 MG/3ML) 0.083% nebulizer solution Commonly known as: PROVENTIL Take 3 mLs (2.5 mg total) by nebulization every 6 (six) hours as needed for wheezing or shortness of breath. Replaces: albuterol 108 (90 Base) MCG/ACT inhaler   arformoterol 15 MCG/2ML Nebu Commonly known as: BROVANA Take 2 mLs (15 mcg total) by nebulization 2 (two) times daily.   carvedilol 25 MG tablet Commonly known as: COREG Take 1 tablet (25 mg total) by mouth 2 (two) times daily with a meal.   feeding supplement Liqd Take 237 mLs by mouth 3 (three) times daily between meals.   haloperidol 5 MG tablet Commonly known as: HALDOL Take 1 tablet (5 mg total) by mouth every 4 (four) hours as needed for agitation. May crush, mix with water and give sublingually if needed.   hyoscyamine 0.125 MG SL tablet Commonly known as: LEVSIN SL Place 1 tablet (0.125 mg total) under the tongue every 4 (four) hours as needed (excess oral secretions).   ipratropium 0.02 % nebulizer solution Commonly known as: ATROVENT Take 2.5 mLs (0.5 mg total) by nebulization 4 (four) times daily.   liver oil-zinc oxide 40 % ointment Commonly known as: DESITIN Apply topically 2 (two) times daily.   LORazepam 0.5 MG tablet Commonly known as: ATIVAN Take  1 tablet (0.5 mg total) by mouth every 4 (four) hours as needed for anxiety. May crush, mix with water and give sublingually if needed.   melatonin 3 MG Tabs tablet Take 1 tablet (3 mg total) by mouth at bedtime.   morphine 20 MG/ML concentrated solution Commonly known as: ROXANOL Take 0.5 mLs (10 mg total) by mouth every 4 (four) hours as needed for severe pain. May give sublingually if  needed.   nitroGLYCERIN 0.4 MG SL tablet Commonly known as: NITROSTAT Place 1 tablet (0.4 mg total) under the tongue every 5 (five) minutes as needed for chest pain. Please schedule appointment for future refills.   omeprazole 40 MG capsule Commonly known as: PRILOSEC Take 40 mg by mouth every evening.   prochlorperazine 10 MG tablet Commonly known as: COMPAZINE Take 1 tablet (10 mg total) by mouth every 4 (four) hours as needed for nausea or vomiting. May crush, mix with water and give sublingually.   senna 8.6 MG tablet Commonly known as: Senokot Take 2 tablets (17.2 mg total) by mouth 2 (two) times daily. May crush, mix with water and give sublingually if needed.               Discharge Care Instructions  (From admission, onward)           Start     Ordered   02/26/22 0000  Discharge wound care:       Comments: Sacral stage 1 pressure ulcer. Please apply barrier cream. Frequent repositioning   02/26/22 1027            Discharge Instructions: Please refer to Patient Instructions section of EMR for full details.  Patient was counseled important signs and symptoms that should prompt return to medical care, changes in medications, dietary instructions, activity restrictions, and follow up appointments.   Follow-Up Appointments:  Follow-up Information     [facility], [facility] Follow up.   Specialty: Skilled Nursing Facility Contact information: [address] [place] [address] [phone]         [facility] Follow up.   Specialty: Hospice and Palliative Medicine Contact information: [address] [place] [address] [phone]               Vonna Drafts MD  02/26/2022, 11:50 AM PGY-1, [facility]  FPTS Upper-Level Resident Addendum   I have independently interviewed and examined the patient. I have discussed the above with the original author and agree with their documentation. My edits for  correction/addition/clarification are included where appropriate. Please see also any attending notes.   Sabino Dick, DO PGY-3, [facility] 02/26/2022 11:51 AM  FPTS Service pager: [phone] (text pages welcome through [facility])

## 2022-02-26 NOTE — Plan of Care (Signed)

## 2022-02-26 NOTE — TOC Progression Note (Signed)
Transition of Care [facility]) - Progression Note    Patient Details  Name: Jocelyn Shelton MRN: [id number] Date of Birth: [birth year]  [facility] CM/SW Contact  Janae Bridgeman, RN Phone Number: 02/26/2022, 11:52 AM  Clinical Narrative:    CM spoke with Cheyenne Adas, Osvaldo Angst, CM this morning and they are aware that patient will be transported to the facility today via [facility] ambulance transport at 2 pm today.  PTAR packet includes discharge summary, AVS, Medical necessity form and facesheet.  I called the patient's daughter and she is aware of discharge to the facility today.  Discharge summary was uploaded in the hub for [facility] and the [facility] history for past 7 days was faxed to the facility.  Bedside nursing - Please give report to the facility at [phone].   Expected Discharge Plan: Skilled Nursing Facility Barriers to Discharge: Continued Medical Work up  Expected Discharge Plan and Services Expected Discharge Plan: Skilled Nursing Facility   Discharge Planning Services: CM Consult Post Acute Care Choice: Skilled Nursing Facility Living arrangements for the past 2 months: Apartment Expected Discharge Date: 02/26/22                                     Social Determinants of Health (SDOH) Interventions    Readmission Risk Interventions    02/24/2022    1:59 PM  Readmission Risk Prevention Plan  Transportation Screening Complete  PCP or Specialist Appt within 5-7 Days Complete  Home Care Screening Complete  Medication Review (RN CM) Complete

## 2022-02-28 ENCOUNTER — Other Ambulatory Visit: Payer: Self-pay | Admitting: Internal Medicine

## 2022-02-28 LAB — CULTURE, BLOOD (ROUTINE X 2)
Culture: NO GROWTH
Special Requests: ADEQUATE

## 2022-03-01 LAB — CULTURE, BLOOD (ROUTINE X 2)
Culture: NO GROWTH
Special Requests: ADEQUATE

## 2022-03-01 LAB — VITAMIN B1: Vitamin B1 (Thiamine): 101.9 nmol/L (ref 66.5–200.0)

## 2022-03-02 ENCOUNTER — Telehealth: Payer: Medicare Other | Admitting: Hospice

## 2022-03-27 ENCOUNTER — Other Ambulatory Visit: Payer: Self-pay

## 2022-03-27 ENCOUNTER — Other Ambulatory Visit: Payer: Self-pay | Admitting: Internal Medicine

## 2022-09-22 ENCOUNTER — Telehealth: Payer: Self-pay | Admitting: Student

## 2022-09-22 NOTE — Telephone Encounter (Signed)
Contacted Jocelyn Shelton to schedule their annual wellness visit. Patient declined to schedule AWV at this time.  Patient's daughter, April, said patient has been in a nursing home since last year.  She thought office had been notified.  Please remove PCP.  Thank you,  [place] Direct dial  [phone]

## 2024-01-04 ENCOUNTER — Encounter: Payer: Self-pay | Admitting: *Deleted
